# Patient Record
Sex: Female | Born: 1950
Health system: Southern US, Community
[De-identification: ages and names within clinical notes are randomized; demographics above are authoritative.]

## PROBLEM LIST (undated history)

## (undated) DIAGNOSIS — I10 Essential (primary) hypertension: Secondary | ICD-10-CM

## (undated) DIAGNOSIS — F329 Major depressive disorder, single episode, unspecified: Secondary | ICD-10-CM

## (undated) DIAGNOSIS — Z8719 Personal history of other diseases of the digestive system: Secondary | ICD-10-CM

## (undated) DIAGNOSIS — G894 Chronic pain syndrome: Secondary | ICD-10-CM

## (undated) DIAGNOSIS — G4730 Sleep apnea, unspecified: Secondary | ICD-10-CM

## (undated) DIAGNOSIS — E119 Type 2 diabetes mellitus without complications: Secondary | ICD-10-CM

## (undated) DIAGNOSIS — F32A Depression, unspecified: Secondary | ICD-10-CM

## (undated) DIAGNOSIS — IMO0001 Reserved for inherently not codable concepts without codable children: Secondary | ICD-10-CM

## (undated) DIAGNOSIS — I340 Nonrheumatic mitral (valve) insufficiency: Secondary | ICD-10-CM

## (undated) DIAGNOSIS — E669 Obesity, unspecified: Secondary | ICD-10-CM

## (undated) DIAGNOSIS — D509 Iron deficiency anemia, unspecified: Secondary | ICD-10-CM

## (undated) DIAGNOSIS — E1121 Type 2 diabetes mellitus with diabetic nephropathy: Secondary | ICD-10-CM

## (undated) DIAGNOSIS — K219 Gastro-esophageal reflux disease without esophagitis: Secondary | ICD-10-CM

## (undated) HISTORY — PX: BACK SURGERY: SHX140

## (undated) HISTORY — PX: CARDIAC CATHETERIZATION: SHX172

## (undated) HISTORY — PX: NOSE SURGERY: SHX723

## (undated) HISTORY — PX: ABDOMINAL HYSTERECTOMY: SHX81

## (undated) HISTORY — PX: HERNIA REPAIR: SHX51

---

## 1998-03-31 HISTORY — PX: OTHER SURGICAL HISTORY: SHX169

## 2001-08-29 HISTORY — PX: LAPAROSCOPIC OVARIAN CYSTECTOMY: SUR786

## 2003-03-01 HISTORY — PX: BREAST BIOPSY: SHX20

## 2003-08-20 ENCOUNTER — Other Ambulatory Visit: Payer: Self-pay

## 2004-03-26 ENCOUNTER — Ambulatory Visit: Payer: Self-pay | Admitting: Internal Medicine

## 2004-04-15 ENCOUNTER — Ambulatory Visit: Payer: Self-pay | Admitting: Rheumatology

## 2004-11-27 ENCOUNTER — Ambulatory Visit: Payer: Self-pay

## 2005-01-19 ENCOUNTER — Ambulatory Visit: Payer: Self-pay | Admitting: Gastroenterology

## 2005-07-01 ENCOUNTER — Ambulatory Visit: Payer: Self-pay | Admitting: Internal Medicine

## 2006-03-08 ENCOUNTER — Ambulatory Visit: Payer: Self-pay | Admitting: Otolaryngology

## 2006-07-18 ENCOUNTER — Ambulatory Visit: Payer: Self-pay | Admitting: Internal Medicine

## 2006-10-05 ENCOUNTER — Ambulatory Visit: Payer: Self-pay | Admitting: Pain Medicine

## 2006-10-08 ENCOUNTER — Ambulatory Visit: Payer: Self-pay | Admitting: Pain Medicine

## 2006-11-29 ENCOUNTER — Ambulatory Visit: Payer: Self-pay | Admitting: Pain Medicine

## 2007-01-11 ENCOUNTER — Ambulatory Visit: Payer: Self-pay | Admitting: Unknown Physician Specialty

## 2007-01-16 ENCOUNTER — Inpatient Hospital Stay: Payer: Self-pay | Admitting: Unknown Physician Specialty

## 2007-02-15 ENCOUNTER — Encounter: Payer: Self-pay | Admitting: Unknown Physician Specialty

## 2007-03-01 ENCOUNTER — Encounter: Payer: Self-pay | Admitting: Unknown Physician Specialty

## 2007-04-01 ENCOUNTER — Encounter: Payer: Self-pay | Admitting: Unknown Physician Specialty

## 2007-08-08 ENCOUNTER — Ambulatory Visit: Payer: Self-pay | Admitting: Internal Medicine

## 2008-01-11 ENCOUNTER — Ambulatory Visit: Payer: Self-pay | Admitting: Gastroenterology

## 2008-01-23 ENCOUNTER — Ambulatory Visit: Payer: Self-pay | Admitting: Unknown Physician Specialty

## 2008-02-13 ENCOUNTER — Ambulatory Visit: Payer: Self-pay | Admitting: Unknown Physician Specialty

## 2008-02-21 ENCOUNTER — Ambulatory Visit: Payer: Self-pay | Admitting: Unknown Physician Specialty

## 2008-02-21 ENCOUNTER — Other Ambulatory Visit: Payer: Self-pay

## 2008-02-21 ENCOUNTER — Ambulatory Visit: Payer: Self-pay | Admitting: Cardiovascular Disease

## 2008-02-28 ENCOUNTER — Inpatient Hospital Stay: Payer: Self-pay | Admitting: Unknown Physician Specialty

## 2008-12-06 ENCOUNTER — Emergency Department: Payer: Self-pay | Admitting: Emergency Medicine

## 2009-02-20 ENCOUNTER — Ambulatory Visit: Payer: Self-pay | Admitting: Unknown Physician Specialty

## 2009-06-25 ENCOUNTER — Ambulatory Visit: Payer: Self-pay | Admitting: Otolaryngology

## 2009-08-25 ENCOUNTER — Ambulatory Visit: Payer: Self-pay | Admitting: Internal Medicine

## 2010-01-07 ENCOUNTER — Ambulatory Visit: Payer: Self-pay | Admitting: Gastroenterology

## 2010-01-14 ENCOUNTER — Ambulatory Visit: Payer: Self-pay | Admitting: Gastroenterology

## 2010-03-18 ENCOUNTER — Ambulatory Visit: Payer: Self-pay | Admitting: Gastroenterology

## 2010-09-10 ENCOUNTER — Ambulatory Visit: Payer: Self-pay | Admitting: Otolaryngology

## 2010-09-14 LAB — PATHOLOGY REPORT

## 2011-09-01 ENCOUNTER — Ambulatory Visit: Payer: Self-pay | Admitting: Internal Medicine

## 2011-10-20 ENCOUNTER — Ambulatory Visit: Payer: Self-pay | Admitting: Orthopedic Surgery

## 2011-10-20 DIAGNOSIS — I10 Essential (primary) hypertension: Secondary | ICD-10-CM

## 2011-10-23 HISTORY — PX: OTHER SURGICAL HISTORY: SHX169

## 2011-10-26 ENCOUNTER — Ambulatory Visit: Payer: Self-pay | Admitting: Orthopedic Surgery

## 2012-04-28 ENCOUNTER — Ambulatory Visit: Payer: Self-pay | Admitting: Physical Medicine and Rehabilitation

## 2012-05-16 ENCOUNTER — Ambulatory Visit: Payer: Self-pay | Admitting: Physical Medicine and Rehabilitation

## 2013-08-28 ENCOUNTER — Ambulatory Visit: Payer: Self-pay | Admitting: Internal Medicine

## 2013-09-08 DIAGNOSIS — I340 Nonrheumatic mitral (valve) insufficiency: Secondary | ICD-10-CM | POA: Insufficient documentation

## 2014-01-01 ENCOUNTER — Ambulatory Visit: Payer: Self-pay | Admitting: Orthopaedic Surgery

## 2014-01-24 ENCOUNTER — Emergency Department: Payer: Self-pay | Admitting: Emergency Medicine

## 2014-01-24 LAB — CBC WITH DIFFERENTIAL/PLATELET
BASOS PCT: 0.4 %
Basophil #: 0 10*3/uL (ref 0.0–0.1)
Eosinophil #: 0.1 10*3/uL (ref 0.0–0.7)
Eosinophil %: 1 %
HCT: 33.6 % — ABNORMAL LOW (ref 35.0–47.0)
HGB: 10.6 g/dL — ABNORMAL LOW (ref 12.0–16.0)
Lymphocyte #: 1.9 10*3/uL (ref 1.0–3.6)
Lymphocyte %: 25.8 %
MCH: 27.2 pg (ref 26.0–34.0)
MCHC: 31.6 g/dL — ABNORMAL LOW (ref 32.0–36.0)
MCV: 86 fL (ref 80–100)
Monocyte #: 0.4 x10 3/mm (ref 0.2–0.9)
Monocyte %: 5.4 %
Neutrophil #: 4.9 10*3/uL (ref 1.4–6.5)
Neutrophil %: 67.4 %
PLATELETS: 244 10*3/uL (ref 150–440)
RBC: 3.9 10*6/uL (ref 3.80–5.20)
RDW: 15.6 % — ABNORMAL HIGH (ref 11.5–14.5)
WBC: 7.3 10*3/uL (ref 3.6–11.0)

## 2014-01-24 LAB — URINALYSIS, COMPLETE
BLOOD: NEGATIVE
Bilirubin,UR: NEGATIVE
Glucose,UR: NEGATIVE mg/dL (ref 0–75)
KETONE: NEGATIVE
Leukocyte Esterase: NEGATIVE
Nitrite: NEGATIVE
Ph: 6 (ref 4.5–8.0)
RBC,UR: 1 /HPF (ref 0–5)
SPECIFIC GRAVITY: 1.014 (ref 1.003–1.030)
Squamous Epithelial: <1
WBC UR: 1 /HPF (ref 0–5)

## 2014-01-24 LAB — COMPREHENSIVE METABOLIC PANEL
ALBUMIN: 3.4 g/dL (ref 3.4–5.0)
ALK PHOS: 101 U/L
AST: 14 U/L — AB (ref 15–37)
Anion Gap: 6 — ABNORMAL LOW (ref 7–16)
BILIRUBIN TOTAL: 0.3 mg/dL (ref 0.2–1.0)
BUN: 8 mg/dL (ref 7–18)
CALCIUM: 8.5 mg/dL (ref 8.5–10.1)
CO2: 32 mmol/L (ref 21–32)
CREATININE: 0.86 mg/dL (ref 0.60–1.30)
Chloride: 105 mmol/L (ref 98–107)
EGFR (African American): >60
Glucose: 127 mg/dL — ABNORMAL HIGH (ref 65–99)
OSMOLALITY: 285 (ref 275–301)
Potassium: 3.4 mmol/L — ABNORMAL LOW (ref 3.5–5.1)
SGPT (ALT): 19 U/L
Sodium: 143 mmol/L (ref 136–145)
Total Protein: 7.5 g/dL (ref 6.4–8.2)

## 2014-02-22 DIAGNOSIS — M4712 Other spondylosis with myelopathy, cervical region: Secondary | ICD-10-CM | POA: Insufficient documentation

## 2014-02-22 DIAGNOSIS — I10 Essential (primary) hypertension: Secondary | ICD-10-CM | POA: Insufficient documentation

## 2014-02-22 DIAGNOSIS — D631 Anemia in chronic kidney disease: Secondary | ICD-10-CM | POA: Insufficient documentation

## 2014-09-22 NOTE — Op Note (Signed)
PATIENT NAME:  Cindy [name], Cindy [name] MR#:  [id number] DATE OF BIRTH:  05-26-1951  DATE OF PROCEDURE:  10/26/2011  PREOPERATIVE DIAGNOSES:  1. Right carpal tunnel syndrome. 2. Right de Quervain's stenosing tenosynovitis.   POSTOPERATIVE DIAGNOSES: 1. Right carpal tunnel syndrome. 2. Right de Quervain's stenosing tenosynovitis.   PROCEDURES:  1. Right carpal tunnel release. 2. Right de Quervain release.   ANESTHESIA: General.   SURGEON: Laurene Footman, MD    DESCRIPTION OF PROCEDURE: The patient was brought to the operating room and after adequate anesthesia was obtained the right arm was prepped and draped in the usual sterile fashion with a tourniquet applied to the upper arm. After prepping and draping in the usual sterile manner, appropriate patient identification and time-out procedures were carried out. The arm was exsanguinated with an Esmarch and the tourniquet raised to 250 mmHg. The carpal tunnel was addressed first with approximately 2.5 cm incision in line with the ring metacarpal along the palmar crease. After making this incision down through the skin and subcutaneous tissue, the transverse carpal ligament was identified and incised. A small hemostat was placed deep to this to protect it and scissors used to release the nerve first distally and then proximally. In the midportion of the carpal tunnel there was some hourglass constriction. After release of this, there was good reverse blood flow and pinking up of the nerve consistent with adequate release. After this was completed, the wound was irrigated and closed with simple interrupted 5-0 nylon.   Next, the de Quervain's was approached through a transverse incision at the level of the wrist crease with the wrist in radial deviation. The skin and subcutaneous tissue were carefully dissected preserving superficial cutaneous nerves. The area of swelling was identified and incised. With the retinaculum then incised, there was a large gush  of fluid consistent with de Quervain's. This was released distally and proximally and a hemostat used to spread the tissue to make sure all bands were separated and there appeared to be adequate release of the transverse carpal ligament.   At this point, this wound was thoroughly irrigated and again closed with simple interrupted 5-0 nylon. A total of 10 mL of 0.5% Sensorcaine without epinephrine was infiltrated into the skin and subcutaneous tissue. Hemostasis was checked with electrocautery prior to wound closure. The wound was then dressed with Xeroform, 4 x 4's, Webril, and Ace wrap. Tourniquet was let down. The patient was sent to the recovery room in stable condition. Total tourniquet time was 19 minutes.   ESTIMATED BLOOD LOSS: Minimal.   COMPLICATIONS: None.   SPECIMENS: None.   ____________________________ Laurene Footman, MD mjm:drc D: 10/26/2011 19:30:27 ET T: 10/27/2011 10:45:16 ET JOB#: [id number]  cc: Laurene Footman, MD, <Dictator> Laurene Footman MD ELECTRONICALLY SIGNED 10/27/2011 17:07

## 2014-10-09 NOTE — H&P (Signed)
Progress Notes   Cindy [name] (MR# [id number])        Progress Notes Info     Author Note Status Last Update User Last Update Date/Time   Isaias Cowman, MD Signed Isaias Cowman, MD Thu Oct 03, 2014 11:08 AM    Progress Notes    Expand All Collapse All   Established Patient Visit   Chief Complaint: Chief Complaint  Patient presents with  . Follow-up    echo/myoview  . Shortness of Breath    with excertion, and sometime she cant finish a sentence  . Dizziness   Date of Service: 10/03/2014 Date of Birth: [birth year] PCP: DAVID Raeanne Barry, MD  History of Present Illness: Cindy [name] is a 64 y.o.female patient who returns for hypertension, hyperlipidemia, type 2 diabetes and new onset chest pain with exertional dyspnea. Previous cardiac catheterization 2004 revealed insignificant coronary artery disease. Patient was seen on 09/10/2014 with new onset chest discomfort, described as heaviness in the retrosternal area, which occurs with or after walking, the last up to an hour. There is no associated radiation nausea or vomiting. Since her initial visit the patient reports she still experiencing recurrent episodes of chest discomfort. Also has exertional dyspnea which has been progressive with such activities as walking uphill. She also complains of episodes of shortness of breath arrest. She also notes occasional episodes of palpitations. She does have persistent peripheral edema. Echocardiogram was performed 09/25/2014 which revealed LVEF greater than 55% with moderate mitral and tricuspid regurgitation. She underwent Lexus scan sestamibi study on 09/25/2014 with gated scintigraphy revealed LV ejection fraction 64%, with mildly enlarged left ventricle. SPECT imaging revealed moderate anterior wall scar and moderate lateral wall ischemia.  The patient has essential hypertension, systolic blood pressure mildly elevated, currently on lisinopril and  amlodipine, tolerated well without apparent side effects.  The patient has hyperlipidemia, LDL cholesterol 85 on 02/22/2014, currently on atorvastatin, which is tolerated well without apparent side effects.  The patient has type 2 diabetes, fasting glucose 86, hemoglobin A1c 7.2 on 02/22/2014, currently taking glimepiride and metformin, both tolerated well without apparent side effects. Patient currently not experiencing any symptoms specific to diabetes.  Past Medical and Surgical History  Past Medical History Past Medical History  Diagnosis Date  . Microalbuminuric diabetic nephropathy   . Hypercholesterolemia   . Essential hypertension, benign   . Allergic rhinitis   . Depression   . GERD (gastroesophageal reflux disease)     gastritis on 8/99 EGD  . Sleep apnea     CPAP 13 cm  . Mitral regurgitation   . Neuropathy associated with endocrine disorder   . Hiatal hernia   . Hx of adenomatous colonic polyps   . Chronic pain syndrome     Neck & low back  . Abnormal barium swallow     osteophytes w/ cervical deformity s/p cervical fusion  . Obesity   . Cervical spondylosis with myelopathy     Dr Patrice Paradise, [facility] in [place]  . Iron deficiency anemia   . Proteinuria   . Carpal tunnel syndrome   . DM (diabetes mellitus), type 2 with renal complications [id number]    Started w/ GDM     Past Surgical History She has past surgical history that includes Hysterectomy (1991); laparoscopic esophagogastric fundoplasty nissen procedure (03/1998); Anterior fusion cervical spine; Hernia repair; excision benign skin lesion leg; Excision Benign Skin Lesion Chest; Endoscopic Carpal Tunnel Release (Right, 10/23/11); Cardiac catheterization; Posterior fusion lumbar spine (12/2006); Removal Ovarian  Cyst (08/2001); and Percutaneous Biopsy Breast (03/2003).   Medications and Allergies  Current  Medications   Current Medications    Current Outpatient Prescriptions  Medication Sig Dispense Refill  . acetaminophen (TYLENOL) 650 MG ER tablet Take 650 mg by mouth once daily.    Marland Kitchen amLODIPine (NORVASC) 10 MG tablet Take 1 tablet (10 mg total) by mouth once daily. For blood pressure 30 tablet prn  . amoxicillin (AMOXIL) 500 MG capsule   0  . aspirin 81 MG EC tablet Take 81 mg by mouth once daily.    Marland Kitchen atorvastatin (LIPITOR) 40 MG tablet Take 1 tablet (40 mg total) by mouth once daily. For cholesterol 30 tablet prn  . cetirizine (ZYRTEC) 10 MG tablet Take 10 mg by mouth once daily as needed for Allergies.    . fluticasone (FLONASE) 50 mcg/actuation nasal spray Place 2 sprays into both nostrils once daily as needed for Allergies. 16 g prn  . FUROsemide (LASIX) 80 MG tablet Take 1 tablet (80 mg total) by mouth 2 (two) times daily. For fluid 60 tablet prn  . glimepiride (AMARYL) 2 MG tablet Take 1 tablet (2 mg total) by mouth as directed. 2 tablets in the morning, one in the evening 90 tablet prn  . lisinopril (PRINIVIL,ZESTRIL) 40 MG tablet Take 1 tablet (40 mg total) by mouth once daily. For blood pressure 30 tablet prn  . metFORMIN (GLUCOPHAGE) 1000 MG tablet Take 1 tablet (1,000 mg total) by mouth 2 (two) times daily with meals. For diabetes 60 tablet prn  . metoprolol succinate (TOPROL-XL) 100 MG XL tablet Take 1.5 tablets (150 mg total) by mouth once daily. For blood pressure 45 tablet 5  . minoxidil 10 MG tablet Take 1 tablet (10 mg total) by mouth once daily. For blood pressure 30 tablet prn  . multivitamin tablet Take 1 tablet by mouth once daily.    Marland Kitchen omeprazole (PRILOSEC OTC) 20 MG tablet Take 20 mg by mouth 2 (two) times daily as needed.     Marland Kitchen PARoxetine (PAXIL) 40 MG tablet Take 1 tablet (40 mg total) by mouth once daily. For nerves 30 tablet prn  . polyethylene glycol (MIRALAX) powder Take 17 g  by mouth once daily. Or as needed for constipation 527 g prn  . potassium chloride (K-DUR,KLOR-CON) 20 MEQ ER tablet Take 2 tablets (40 mEq total) by mouth 2 (two) times daily. For potassium 120 tablet prn  . tiZANidine (ZANAFLEX) 4 MG tablet Take 4 mg by mouth every 12 (twelve) hours as needed for Muscle spasms.   0  . traMADol (ULTRAM) 50 mg tablet Take 50 mg by mouth every 8 (eight) hours as needed for Pain.     No current facility-administered medications for this visit.      Allergies: Vicodin  Social and Family History  Social History  reports that she has never smoked. Her smokeless tobacco use includes Snuff. She reports that she does not drink alcohol or use illicit drugs.  Family History Family History  Problem Relation Age of Onset  . Heart attack Mother 15  . Hypertension Father   . Stroke Father   . Heart attack Brother 41    Review of Systems   Review of Systems: The patient reports chest pain, with exertional shortness of breath, without orthopnea, paroxysmal nocturnal dyspnea, with 1st pedal edema, occasional palpitations, heart racing, without presyncope, syncope. Review of 12 Systems is negative except as described above.  Physical Examination   Vitals:BP 146/80 mmHg  Pulse 62  Ht 154.9 cm (5\' 1" )  Wt 114.669 kg (252 lb 12.8 oz)  BMI 47.79 kg/m2 Ht:154.9 cm (5\' 1" ) Wt:114.669 kg (252 lb 12.8 oz) [id number] surface area is 2.22 meters squared. Body mass index is 47.79 kg/(m^2).  HEENT: Pupils equally reactive to light and accomodation Neck: Supple without thyromegaly, carotid pulses 2+ Lungs: clear to auscultation bilaterally; no wheezes, rales, rhonchi Heart: Regular rate and rhythm. No gallops, murmurs or rub Abdomen: soft nontender, nondistended, with normal bowel sounds Extremities: no cyanosis, clubbing, or edema Peripheral Pulses: 2+ in all extremities, 2+ femoral pulses bilaterally  Assessment    64 y.o. female with  1. Non-rheumatic mitral regurgitation   2. Essential hypertension, benign   3. CAD S/P percutaneous coronary angioplasty   4. Type 2 diabetes mellitus with diabetic nephropathy   5. Pedal edema   6. Hypercholesterolemia   7. Chest pain with high risk for cardiac etiology    64 year old female with multiple cardiovascular risk factors, including hypertension, hyperlipidemia and type 2 diabetes, new onset substernal chest discomfort and progressive exertional dyspnea. Lexus scan sestamibi study revealed not normal left ventricular function with moderate anterior scar and moderate lateral wall ischemia. The patient has [place] class 3 chest pain with moderate risk stress test result.  The patient has essential hypertension, systolic blood pressure mildly elevated, currently on lisinopril and and amlodipine, tolerated well without apparent side effects. The patient has hyperlipidemia, good control, currently taking atorvastatin, tolerated well without apparent side effects. The patient has type 2 diabetes, with adequate control, currently on glimepiride and metformin.    Plan   1. Continue current medications 2. Counseled patient about low-sodium diet 3. DASH diet printed instructions given to the patient 4. Counseled patient about low-cholesterol diet 5. Continue atorvastatin for hyperlipidemia management  6. Low-fat and cholesterol diet printed instructions given to the patient  7. Diabetes diet printed instructions given to the patient next 8. Proceed with cardiac catheterization with selective coronary arteriography. The risks, benefits alternatives of cardiac catheterization were explained to the patient and informed written consent was obtained. Hold metformin morning of cardiac catheterization.  No orders of the defined types were placed in this encounter.   Return after cardiac cath.  Isaias Cowman, [facility]   [address] [place] [id number]    Service Location    Name Address       [address] [place] [address]      Department    Name Address Phone Fax   [facility] [place] [address] [phone]

## 2014-10-10 ENCOUNTER — Encounter
Admission: RE | Disposition: A | Discharge status: Home / Self Care | Payer: Medicare Other | Source: Ambulatory Visit | Attending: Cardiology

## 2014-10-10 ENCOUNTER — Ambulatory Visit
Admission: RE | Admit: 2014-10-10 | Discharge: 2014-10-10 | Disposition: A | Discharge status: Home / Self Care | Payer: Medicare Other | Source: Ambulatory Visit | Attending: Cardiology | Admitting: Cardiology

## 2014-10-10 ENCOUNTER — Encounter: Payer: Self-pay | Admitting: *Deleted

## 2014-10-10 DIAGNOSIS — D509 Iron deficiency anemia, unspecified: Secondary | ICD-10-CM | POA: Insufficient documentation

## 2014-10-10 DIAGNOSIS — G4730 Sleep apnea, unspecified: Secondary | ICD-10-CM | POA: Insufficient documentation

## 2014-10-10 DIAGNOSIS — G894 Chronic pain syndrome: Secondary | ICD-10-CM | POA: Diagnosis not present

## 2014-10-10 DIAGNOSIS — E785 Hyperlipidemia, unspecified: Secondary | ICD-10-CM | POA: Diagnosis not present

## 2014-10-10 DIAGNOSIS — R809 Proteinuria, unspecified: Secondary | ICD-10-CM | POA: Diagnosis not present

## 2014-10-10 DIAGNOSIS — F329 Major depressive disorder, single episode, unspecified: Secondary | ICD-10-CM | POA: Insufficient documentation

## 2014-10-10 DIAGNOSIS — G5600 Carpal tunnel syndrome, unspecified upper limb: Secondary | ICD-10-CM | POA: Diagnosis not present

## 2014-10-10 DIAGNOSIS — I2510 Atherosclerotic heart disease of native coronary artery without angina pectoris: Secondary | ICD-10-CM | POA: Insufficient documentation

## 2014-10-10 DIAGNOSIS — R600 Localized edema: Secondary | ICD-10-CM | POA: Insufficient documentation

## 2014-10-10 DIAGNOSIS — I10 Essential (primary) hypertension: Secondary | ICD-10-CM | POA: Diagnosis not present

## 2014-10-10 DIAGNOSIS — M7989 Other specified soft tissue disorders: Secondary | ICD-10-CM | POA: Diagnosis not present

## 2014-10-10 DIAGNOSIS — M419 Scoliosis, unspecified: Secondary | ICD-10-CM | POA: Diagnosis not present

## 2014-10-10 DIAGNOSIS — J309 Allergic rhinitis, unspecified: Secondary | ICD-10-CM | POA: Insufficient documentation

## 2014-10-10 DIAGNOSIS — Z981 Arthrodesis status: Secondary | ICD-10-CM | POA: Diagnosis not present

## 2014-10-10 DIAGNOSIS — M542 Cervicalgia: Secondary | ICD-10-CM | POA: Insufficient documentation

## 2014-10-10 DIAGNOSIS — E780 Pure hypercholesterolemia: Secondary | ICD-10-CM | POA: Diagnosis not present

## 2014-10-10 DIAGNOSIS — E1140 Type 2 diabetes mellitus with diabetic neuropathy, unspecified: Secondary | ICD-10-CM | POA: Insufficient documentation

## 2014-10-10 DIAGNOSIS — K449 Diaphragmatic hernia without obstruction or gangrene: Secondary | ICD-10-CM | POA: Insufficient documentation

## 2014-10-10 DIAGNOSIS — R0600 Dyspnea, unspecified: Secondary | ICD-10-CM | POA: Diagnosis present

## 2014-10-10 DIAGNOSIS — K2970 Gastritis, unspecified, without bleeding: Secondary | ICD-10-CM | POA: Insufficient documentation

## 2014-10-10 DIAGNOSIS — I340 Nonrheumatic mitral (valve) insufficiency: Secondary | ICD-10-CM | POA: Diagnosis not present

## 2014-10-10 DIAGNOSIS — Z79899 Other long term (current) drug therapy: Secondary | ICD-10-CM | POA: Diagnosis not present

## 2014-10-10 DIAGNOSIS — R079 Chest pain, unspecified: Secondary | ICD-10-CM | POA: Insufficient documentation

## 2014-10-10 DIAGNOSIS — M545 Low back pain: Secondary | ICD-10-CM | POA: Diagnosis not present

## 2014-10-10 DIAGNOSIS — R0602 Shortness of breath: Secondary | ICD-10-CM | POA: Insufficient documentation

## 2014-10-10 DIAGNOSIS — Z86010 Personal history of colonic polyps: Secondary | ICD-10-CM | POA: Insufficient documentation

## 2014-10-10 DIAGNOSIS — R42 Dizziness and giddiness: Secondary | ICD-10-CM | POA: Diagnosis not present

## 2014-10-10 DIAGNOSIS — K219 Gastro-esophageal reflux disease without esophagitis: Secondary | ICD-10-CM | POA: Insufficient documentation

## 2014-10-10 DIAGNOSIS — E669 Obesity, unspecified: Secondary | ICD-10-CM | POA: Diagnosis not present

## 2014-10-10 HISTORY — DX: Depression, unspecified: F32.A

## 2014-10-10 HISTORY — DX: Gastro-esophageal reflux disease without esophagitis: K21.9

## 2014-10-10 HISTORY — DX: Personal history of other diseases of the digestive system: Z87.19

## 2014-10-10 HISTORY — DX: Essential (primary) hypertension: I10

## 2014-10-10 HISTORY — PX: CARDIAC CATHETERIZATION: SHX172

## 2014-10-10 HISTORY — DX: Major depressive disorder, single episode, unspecified: F32.9

## 2014-10-10 HISTORY — DX: Sleep apnea, unspecified: G47.30

## 2014-10-10 HISTORY — DX: Reserved for inherently not codable concepts without codable children: IMO0001

## 2014-10-10 HISTORY — DX: Type 2 diabetes mellitus without complications: E11.9

## 2014-10-10 LAB — GLUCOSE, CAPILLARY: GLUCOSE-CAPILLARY: 117 mg/dL — AB (ref 65–99)

## 2014-10-10 SURGERY — LEFT HEART CATH
Anesthesia: Moderate Sedation

## 2014-10-10 MED ORDER — SODIUM CHLORIDE 0.9 % IV SOLN
INTRAVENOUS | Status: DC
  Administered 2014-10-10: 08:00:00 via INTRAVENOUS

## 2014-10-10 MED ORDER — SODIUM CHLORIDE 0.9 % WEIGHT BASED INFUSION: 3.0000 mL/kg/h | INTRAVENOUS | Status: DC

## 2014-10-10 MED ORDER — SODIUM CHLORIDE 0.9 % IV SOLN: 250.0000 mL | INTRAVENOUS | Status: DC | PRN

## 2014-10-10 MED ORDER — ONDANSETRON HCL 4 MG/2ML IJ SOLN
4.0000 mg | Freq: Four times a day (QID) | INTRAMUSCULAR | Status: DC | PRN
Start: 2014-10-10 — End: 2014-10-10

## 2014-10-10 MED ORDER — MIDAZOLAM HCL 2 MG/2ML IJ SOLN
INTRAMUSCULAR | Status: DC | PRN
Start: 2014-10-10 — End: 2014-10-10
  Administered 2014-10-10 (×2): 1 mg via INTRAVENOUS

## 2014-10-10 MED ORDER — FENTANYL CITRATE (PF) 100 MCG/2ML IJ SOLN
INTRAMUSCULAR | Status: DC | PRN
  Administered 2014-10-10: 25 ug via INTRAVENOUS

## 2014-10-10 MED ORDER — SODIUM CHLORIDE 0.9 % IJ SOLN
3.0000 mL | Freq: Two times a day (BID) | INTRAMUSCULAR | Status: DC
Start: 2014-10-10 — End: 2014-10-10

## 2014-10-10 MED ORDER — SODIUM CHLORIDE 0.9 % IJ SOLN: 3.0000 mL | INTRAMUSCULAR | Status: DC | PRN

## 2014-10-10 MED ORDER — METOPROLOL TARTRATE 1 MG/ML IV SOLN
INTRAVENOUS | Status: AC
  Filled 2014-10-10: qty 5

## 2014-10-10 MED ORDER — FENTANYL CITRATE (PF) 100 MCG/2ML IJ SOLN
INTRAMUSCULAR | Status: AC
  Filled 2014-10-10: qty 2

## 2014-10-10 MED ORDER — METOPROLOL TARTRATE 1 MG/ML IV SOLN
INTRAVENOUS | Status: DC | PRN
  Administered 2014-10-10 (×2): 2.5 mg via INTRAVENOUS

## 2014-10-10 MED ORDER — ACETAMINOPHEN 325 MG PO TABS: 650.0000 mg | ORAL_TABLET | ORAL | Status: DC | PRN

## 2014-10-10 MED ORDER — MIDAZOLAM HCL 2 MG/2ML IJ SOLN
INTRAMUSCULAR | Status: AC
  Filled 2014-10-10: qty 2

## 2014-10-10 SURGICAL SUPPLY — 9 items
CATH INFINITI 5FR ANG PIGTAIL (CATHETERS) ×3 IMPLANT
CATH INFINITI 5FR JL4 (CATHETERS) ×3 IMPLANT
CATH INFINITI JR4 5F (CATHETERS) ×3 IMPLANT
DEVICE CLOSURE MYNXGRIP 5F (Vascular Products) ×3 IMPLANT
KIT MANI 3VAL PERCEP (MISCELLANEOUS) ×3 IMPLANT
NEEDLE PERC 18GX7CM (NEEDLE) ×3 IMPLANT
PACK CARDIAC CATH (CUSTOM PROCEDURE TRAY) ×3 IMPLANT
SHEATH AVANTI 5FR X 11CM (SHEATH) ×3 IMPLANT
WIRE EMERALD 3MM-J .035X150CM (WIRE) ×3 IMPLANT

## 2014-10-31 ENCOUNTER — Other Ambulatory Visit: Payer: Self-pay | Admitting: Physical Medicine and Rehabilitation

## 2014-10-31 ENCOUNTER — Ambulatory Visit
Admission: RE | Admit: 2014-10-31 | Discharge: 2014-10-31 | Disposition: A | Discharge status: Home / Self Care | Payer: Medicare Other | Source: Ambulatory Visit | Attending: Physical Medicine and Rehabilitation | Admitting: Physical Medicine and Rehabilitation

## 2014-10-31 DIAGNOSIS — M79661 Pain in right lower leg: Secondary | ICD-10-CM

## 2014-10-31 DIAGNOSIS — M79604 Pain in right leg: Secondary | ICD-10-CM | POA: Diagnosis not present

## 2014-10-31 DIAGNOSIS — M51369 Other intervertebral disc degeneration, lumbar region without mention of lumbar back pain or lower extremity pain: Secondary | ICD-10-CM | POA: Insufficient documentation

## 2014-10-31 DIAGNOSIS — M5136 Other intervertebral disc degeneration, lumbar region: Secondary | ICD-10-CM | POA: Insufficient documentation

## 2014-11-01 ENCOUNTER — Other Ambulatory Visit: Payer: Self-pay | Admitting: Physical Medicine and Rehabilitation

## 2014-11-01 DIAGNOSIS — M5416 Radiculopathy, lumbar region: Secondary | ICD-10-CM

## 2014-11-11 ENCOUNTER — Ambulatory Visit
Admission: RE | Admit: 2014-11-11 | Discharge: 2014-11-11 | Disposition: A | Discharge status: Home / Self Care | Payer: Medicare Other | Source: Ambulatory Visit | Attending: Physical Medicine and Rehabilitation | Admitting: Physical Medicine and Rehabilitation

## 2014-11-11 DIAGNOSIS — M5416 Radiculopathy, lumbar region: Secondary | ICD-10-CM

## 2014-11-11 DIAGNOSIS — M4806 Spinal stenosis, lumbar region: Secondary | ICD-10-CM | POA: Insufficient documentation

## 2014-11-11 DIAGNOSIS — M545 Low back pain: Secondary | ICD-10-CM | POA: Diagnosis present

## 2014-11-11 DIAGNOSIS — Z9889 Other specified postprocedural states: Secondary | ICD-10-CM | POA: Diagnosis not present

## 2014-11-11 DIAGNOSIS — M5126 Other intervertebral disc displacement, lumbar region: Secondary | ICD-10-CM | POA: Insufficient documentation

## 2014-11-11 DIAGNOSIS — M5136 Other intervertebral disc degeneration, lumbar region: Secondary | ICD-10-CM | POA: Diagnosis not present

## 2014-11-11 DIAGNOSIS — M47896 Other spondylosis, lumbar region: Secondary | ICD-10-CM | POA: Insufficient documentation

## 2014-11-11 DIAGNOSIS — Z981 Arthrodesis status: Secondary | ICD-10-CM | POA: Insufficient documentation

## 2014-11-11 DIAGNOSIS — M79604 Pain in right leg: Secondary | ICD-10-CM | POA: Diagnosis present

## 2015-02-07 ENCOUNTER — Other Ambulatory Visit: Payer: Self-pay | Admitting: Nurse Practitioner

## 2015-02-07 DIAGNOSIS — R110 Nausea: Secondary | ICD-10-CM

## 2015-02-07 DIAGNOSIS — R1011 Right upper quadrant pain: Secondary | ICD-10-CM

## 2015-02-12 ENCOUNTER — Ambulatory Visit
Admission: RE | Admit: 2015-02-12 | Discharge: 2015-02-12 | Disposition: A | Discharge status: Home / Self Care | Payer: PPO | Source: Ambulatory Visit | Attending: Nurse Practitioner | Admitting: Nurse Practitioner

## 2015-02-12 DIAGNOSIS — R110 Nausea: Secondary | ICD-10-CM

## 2015-02-12 DIAGNOSIS — R1011 Right upper quadrant pain: Secondary | ICD-10-CM | POA: Insufficient documentation

## 2015-02-19 ENCOUNTER — Encounter: Payer: Self-pay | Admitting: *Deleted

## 2015-02-20 ENCOUNTER — Ambulatory Visit: Payer: PPO | Admitting: Anesthesiology

## 2015-02-20 ENCOUNTER — Encounter: Payer: Self-pay | Admitting: Anesthesiology

## 2015-02-20 ENCOUNTER — Encounter
Admission: RE | Disposition: A | Discharge status: Home / Self Care | Payer: Self-pay | Source: Ambulatory Visit | Attending: Gastroenterology

## 2015-02-20 ENCOUNTER — Ambulatory Visit
Admission: RE | Admit: 2015-02-20 | Discharge: 2015-02-20 | Disposition: A | Discharge status: Home / Self Care | Payer: PPO | Source: Ambulatory Visit | Attending: Gastroenterology | Admitting: Gastroenterology

## 2015-02-20 DIAGNOSIS — R194 Change in bowel habit: Secondary | ICD-10-CM | POA: Insufficient documentation

## 2015-02-20 DIAGNOSIS — R808 Other proteinuria: Secondary | ICD-10-CM | POA: Diagnosis not present

## 2015-02-20 DIAGNOSIS — Z823 Family history of stroke: Secondary | ICD-10-CM | POA: Insufficient documentation

## 2015-02-20 DIAGNOSIS — Z79899 Other long term (current) drug therapy: Secondary | ICD-10-CM | POA: Insufficient documentation

## 2015-02-20 DIAGNOSIS — M47892 Other spondylosis, cervical region: Secondary | ICD-10-CM | POA: Insufficient documentation

## 2015-02-20 DIAGNOSIS — Z86010 Personal history of colonic polyps: Secondary | ICD-10-CM | POA: Diagnosis present

## 2015-02-20 DIAGNOSIS — F329 Major depressive disorder, single episode, unspecified: Secondary | ICD-10-CM | POA: Diagnosis not present

## 2015-02-20 DIAGNOSIS — E1140 Type 2 diabetes mellitus with diabetic neuropathy, unspecified: Secondary | ICD-10-CM | POA: Insufficient documentation

## 2015-02-20 DIAGNOSIS — E669 Obesity, unspecified: Secondary | ICD-10-CM | POA: Diagnosis not present

## 2015-02-20 DIAGNOSIS — G4730 Sleep apnea, unspecified: Secondary | ICD-10-CM | POA: Diagnosis not present

## 2015-02-20 DIAGNOSIS — R1310 Dysphagia, unspecified: Secondary | ICD-10-CM | POA: Insufficient documentation

## 2015-02-20 DIAGNOSIS — K219 Gastro-esophageal reflux disease without esophagitis: Secondary | ICD-10-CM | POA: Diagnosis not present

## 2015-02-20 DIAGNOSIS — K921 Melena: Secondary | ICD-10-CM | POA: Diagnosis not present

## 2015-02-20 DIAGNOSIS — I340 Nonrheumatic mitral (valve) insufficiency: Secondary | ICD-10-CM | POA: Diagnosis not present

## 2015-02-20 DIAGNOSIS — Z6841 Body Mass Index (BMI) 40.0 and over, adult: Secondary | ICD-10-CM | POA: Insufficient documentation

## 2015-02-20 DIAGNOSIS — Z90710 Acquired absence of both cervix and uterus: Secondary | ICD-10-CM | POA: Insufficient documentation

## 2015-02-20 DIAGNOSIS — Z885 Allergy status to narcotic agent status: Secondary | ICD-10-CM | POA: Diagnosis not present

## 2015-02-20 DIAGNOSIS — K5900 Constipation, unspecified: Secondary | ICD-10-CM | POA: Diagnosis not present

## 2015-02-20 DIAGNOSIS — E1121 Type 2 diabetes mellitus with diabetic nephropathy: Secondary | ICD-10-CM | POA: Diagnosis not present

## 2015-02-20 DIAGNOSIS — R12 Heartburn: Secondary | ICD-10-CM | POA: Diagnosis not present

## 2015-02-20 DIAGNOSIS — I10 Essential (primary) hypertension: Secondary | ICD-10-CM | POA: Insufficient documentation

## 2015-02-20 DIAGNOSIS — J309 Allergic rhinitis, unspecified: Secondary | ICD-10-CM | POA: Diagnosis not present

## 2015-02-20 DIAGNOSIS — Z8249 Family history of ischemic heart disease and other diseases of the circulatory system: Secondary | ICD-10-CM | POA: Insufficient documentation

## 2015-02-20 HISTORY — DX: Iron deficiency anemia, unspecified: D50.9

## 2015-02-20 HISTORY — PX: COLONOSCOPY WITH PROPOFOL: SHX5780

## 2015-02-20 HISTORY — DX: Chronic pain syndrome: G89.4

## 2015-02-20 HISTORY — PX: ESOPHAGOGASTRODUODENOSCOPY (EGD) WITH PROPOFOL: SHX5813

## 2015-02-20 HISTORY — DX: Obesity, unspecified: E66.9

## 2015-02-20 HISTORY — DX: Nonrheumatic mitral (valve) insufficiency: I34.0

## 2015-02-20 HISTORY — DX: Type 2 diabetes mellitus with diabetic nephropathy: E11.21

## 2015-02-20 LAB — GLUCOSE, CAPILLARY: GLUCOSE-CAPILLARY: 197 mg/dL — AB (ref 65–99)

## 2015-02-20 SURGERY — COLONOSCOPY WITH PROPOFOL
Anesthesia: General

## 2015-02-20 MED ORDER — GLYCOPYRROLATE 0.2 MG/ML IJ SOLN
INTRAMUSCULAR | Status: DC | PRN
  Administered 2015-02-20: 0.2 mg via INTRAVENOUS

## 2015-02-20 MED ORDER — SODIUM CHLORIDE 0.9 % IV SOLN: INTRAVENOUS | Status: DC

## 2015-02-20 MED ORDER — MIDAZOLAM HCL 2 MG/2ML IJ SOLN
INTRAMUSCULAR | Status: DC | PRN
  Administered 2015-02-20: 2 mg via INTRAVENOUS

## 2015-02-20 MED ORDER — SODIUM CHLORIDE 0.9 % IV SOLN
INTRAVENOUS | Status: DC
  Administered 2015-02-20: 08:00:00 via INTRAVENOUS

## 2015-02-20 MED ORDER — FENTANYL CITRATE (PF) 100 MCG/2ML IJ SOLN
INTRAMUSCULAR | Status: DC | PRN
  Administered 2015-02-20: 50 ug via INTRAVENOUS

## 2015-02-20 MED ORDER — PROPOFOL INFUSION 10 MG/ML OPTIME
INTRAVENOUS | Status: DC | PRN
Start: 2015-02-20 — End: 2015-02-20
  Administered 2015-02-20: 140 ug/kg/min via INTRAVENOUS

## 2015-02-20 MED ORDER — LIDOCAINE HCL (CARDIAC) 20 MG/ML IV SOLN
INTRAVENOUS | Status: DC | PRN
  Administered 2015-02-20: 80 mg via INTRAVENOUS

## 2015-02-20 NOTE — Anesthesia Preprocedure Evaluation (Signed)
Anesthesia Evaluation  Patient identified by MRN, date of birth, ID band Patient awake    Reviewed: Allergy & Precautions, NPO status , Patient's Chart, lab work & pertinent test results, reviewed documented beta blocker date and time   Airway Mallampati: III  TM Distance: <3 FB Neck ROM: Limited    Dental  (+) Caps   Pulmonary shortness of breath and with exertion, sleep apnea ,    Pulmonary exam normal breath sounds clear to auscultation       Cardiovascular hypertension, Pt. on medications and Pt. on home beta blockers Normal cardiovascular exam     Neuro/Psych Depression    GI/Hepatic hiatal hernia, GERD  Medicated and Controlled,  Endo/Other  diabetes, Type 2, Oral Hypoglycemic Agents  Renal/GU Diabetic nephropathy  negative genitourinary   Musculoskeletal negative musculoskeletal ROS (+)   Abdominal Normal abdominal exam  (+)   Peds negative pediatric ROS (+)  Hematology  (+) anemia ,   Anesthesia Other Findings obesity  Reproductive/Obstetrics                             Anesthesia Physical Anesthesia Plan  ASA: III  Anesthesia Plan: General   Post-op Pain Management:    Induction: Intravenous  Airway Management Planned: Nasal Cannula  Additional Equipment:   Intra-op Plan:   Post-operative Plan:   Informed Consent: I have reviewed the patients History and Physical, chart, labs and discussed the procedure including the risks, benefits and alternatives for the proposed anesthesia with the patient or authorized representative who has indicated his/her understanding and acceptance.   Dental advisory given  Plan Discussed with: CRNA and Surgeon  Anesthesia Plan Comments:         Anesthesia Quick Evaluation

## 2015-02-20 NOTE — Transfer of Care (Signed)
Immediate Anesthesia Transfer of Care Note  Patient: Cindy [name]  Procedure(s) Performed: Procedure(s): COLONOSCOPY WITH PROPOFOL (N/A) ESOPHAGOGASTRODUODENOSCOPY (EGD) WITH PROPOFOL (N/A)  Patient Location: PACU and Endoscopy Unit  Anesthesia Type:General  Level of Consciousness: awake, alert  and oriented  Airway & Oxygen Therapy: Patient Spontanous Breathing and Patient connected to nasal cannula oxygen  Post-op Assessment: Report given to RN and Post -op Vital signs reviewed and stable  Post vital signs: Reviewed and stable  Last Vitals: There were no vitals filed for this visit.  Complications: No apparent anesthesia complications

## 2015-02-20 NOTE — Op Note (Signed)
[facility] Gastroenterology Patient Name: Cindy [name] Procedure Date: 02/20/2015 8:36 AM MRN: [id number] Account #: [id number] Date of Birth: 1950/12/10 Admit Type: Outpatient Age: 64 Room: [facility] ENDO ROOM 4 Gender: Female Note Status: Finalized Procedure:         Upper GI endoscopy Indications:       Dysphagia Providers:         Lupita Dawn. Candace Cruise, MD Referring MD:      Christena Flake. Raechel Ache, MD (Referring MD) Medicines:         Monitored Anesthesia Care Complications:     No immediate complications. Procedure:         Pre-Anesthesia Assessment:                    - Prior to the procedure, a History and Physical was                     performed, and patient medications, allergies and                     sensitivities were reviewed. The patient's tolerance of                     previous anesthesia was reviewed.                    - The risks and benefits of the procedure and the sedation                     options and risks were discussed with the patient. All                     questions were answered and informed consent was obtained.                    - After reviewing the risks and benefits, the patient was                     deemed in satisfactory condition to undergo the procedure.                    After obtaining informed consent, the endoscope was passed                     under direct vision. Throughout the procedure, the                     patient's blood pressure, pulse, and oxygen saturations                     were monitored continuously. The Endoscope was introduced                     through the mouth, and advanced to the second part of                     duodenum. The upper GI endoscopy was accomplished without                     difficulty. The patient tolerated the procedure well. Findings:      No endoscopic abnormality was evident in the esophagus to explain the       patient's complaint of dysphagia. It was decided, however, to proceed    with dilation of the entire esophagus.  The scope was withdrawn. Dilation       was performed with a Maloney dilator with mild resistance at 68 Fr.      The entire examined stomach was normal.      The examined duodenum was normal.      Nissen intact. Impression:        - No endoscopic esophageal abnormality to explain                     patient's dysphagia. Esophagus dilated. Dilated.                    - Normal stomach.                    - Normal examined duodenum.                    - No specimens collected. Recommendation:    - Discharge patient to home.                    - Observe patient's clinical course.                    - The findings and recommendations were discussed with the                     patient. Procedure Code(s): --- Professional ---                    [phone], Esophagogastroduodenoscopy, flexible, transoral;                     diagnostic, including collection of specimen(s) by                     brushing or washing, when performed (separate procedure)                    43450, Dilation of esophagus, by unguided sound or bougie,                     single or multiple passes Diagnosis Code(s): --- Professional ---                    R13.10, Dysphagia, unspecified CPT copyright 2014 American Medical Association. All rights reserved. The codes documented in this report are preliminary and upon coder review may  be revised to meet current compliance requirements. Hulen Luster, MD 02/20/2015 8:51:27 AM This report has been signed electronically. Number of Addenda: 0 Note Initiated On: 02/20/2015 8:36 AM      [facility]

## 2015-02-20 NOTE — H&P (Signed)
  Date of Initial H&P: 02/07/2015 History reviewed, patient examined, no change in status, stable for surgery.

## 2015-02-20 NOTE — Op Note (Signed)
[facility] Gastroenterology Patient Name: Cindy [name] Procedure Date: 02/20/2015 8:31 AM MRN: [id number] Account #: [id number] Date of Birth: 09-09-1950 Admit Type: Outpatient Age: 64 Room: [facility] ENDO ROOM 4 Gender: Female Note Status: Finalized Procedure:         Colonoscopy Indications:       Personal history of colonic polyps Providers:         Lupita Dawn. Candace Cruise, MD Referring MD:      Christena Flake. Raechel Ache, MD (Referring MD) Medicines:         Monitored Anesthesia Care Complications:     No immediate complications. Procedure:         Pre-Anesthesia Assessment:                    - Prior to the procedure, a History and Physical was                     performed, and patient medications, allergies and                     sensitivities were reviewed. The patient's tolerance of                     previous anesthesia was reviewed.                    - The risks and benefits of the procedure and the sedation                     options and risks were discussed with the patient. All                     questions were answered and informed consent was obtained.                    - After reviewing the risks and benefits, the patient was                     deemed in satisfactory condition to undergo the procedure.                    After obtaining informed consent, the colonoscope was                     passed under direct vision. Throughout the procedure, the                     patient's blood pressure, pulse, and oxygen saturations                     were monitored continuously. The Colonoscope was                     introduced through the anus and advanced to the the cecum,                     identified by appendiceal orifice and ileocecal valve. The                     colonoscopy was performed without difficulty. The patient                     tolerated the procedure well. The quality of the bowel  preparation was good. Findings:      The colon  (entire examined portion) appeared normal. Impression:        - The entire examined colon is normal.                    - No specimens collected. Recommendation:    - Discharge patient to home.                    - Repeat colonoscopy in 5 years for surveillance.                    - The findings and recommendations were discussed with the                     patient. Procedure Code(s): --- Professional ---                    [phone], Colonoscopy, flexible; diagnostic, including                     collection of specimen(s) by brushing or washing, when                     performed (separate procedure) Diagnosis Code(s): --- Professional ---                    Z86.010, Personal history of colonic polyps CPT copyright 2014 American Medical Association. All rights reserved. The codes documented in this report are preliminary and upon coder review may  be revised to meet current compliance requirements. Hulen Luster, MD 02/20/2015 9:10:50 AM This report has been signed electronically. Number of Addenda: 0 Note Initiated On: 02/20/2015 8:31 AM Scope Withdrawal Time: 0 hours 10 minutes 23 seconds  Total Procedure Duration: 0 hours 16 minutes 1 second       [facility]

## 2015-02-24 NOTE — Anesthesia Postprocedure Evaluation (Signed)
  Anesthesia Post-op Note  Patient: Cindy [name]  Procedure(s) Performed: Procedure(s): COLONOSCOPY WITH PROPOFOL (N/A) ESOPHAGOGASTRODUODENOSCOPY (EGD) WITH PROPOFOL (N/A)  Anesthesia type:General  Patient location: PACU  Post pain: Pain level controlled  Post assessment: Post-op Vital signs reviewed, Patient's Cardiovascular Status Stable, Respiratory Function Stable, Patent Airway and No signs of Nausea or vomiting  Post vital signs: Reviewed and stable  Last Vitals:  Filed Vitals:   02/20/15 0934  BP: 156/71  Pulse: 66  Temp:   Resp: 26    Level of consciousness: awake, alert  and patient cooperative  Complications: No apparent anesthesia complications

## 2015-06-09 ENCOUNTER — Encounter: Payer: Self-pay | Admitting: *Deleted

## 2015-06-09 ENCOUNTER — Emergency Department
Admission: EM | Admit: 2015-06-09 | Discharge: 2015-06-09 | Disposition: A | Discharge status: Home / Self Care | Payer: PPO | Attending: Emergency Medicine | Admitting: Emergency Medicine

## 2015-06-09 DIAGNOSIS — E1121 Type 2 diabetes mellitus with diabetic nephropathy: Secondary | ICD-10-CM | POA: Diagnosis not present

## 2015-06-09 DIAGNOSIS — Z79899 Other long term (current) drug therapy: Secondary | ICD-10-CM | POA: Diagnosis not present

## 2015-06-09 DIAGNOSIS — Z7984 Long term (current) use of oral hypoglycemic drugs: Secondary | ICD-10-CM | POA: Diagnosis not present

## 2015-06-09 DIAGNOSIS — Z792 Long term (current) use of antibiotics: Secondary | ICD-10-CM | POA: Insufficient documentation

## 2015-06-09 DIAGNOSIS — I10 Essential (primary) hypertension: Secondary | ICD-10-CM | POA: Diagnosis not present

## 2015-06-09 DIAGNOSIS — Z7951 Long term (current) use of inhaled steroids: Secondary | ICD-10-CM | POA: Diagnosis not present

## 2015-06-09 DIAGNOSIS — L0291 Cutaneous abscess, unspecified: Secondary | ICD-10-CM

## 2015-06-09 DIAGNOSIS — L0231 Cutaneous abscess of buttock: Secondary | ICD-10-CM | POA: Insufficient documentation

## 2015-06-09 MED ORDER — SULFAMETHOXAZOLE-TRIMETHOPRIM 800-160 MG PO TABS: 1.0000 | ORAL_TABLET | Freq: Two times a day (BID) | ORAL | Status: DC

## 2015-06-09 NOTE — ED Notes (Signed)
aBCESS on tailbone now has small areas on face, some fever, some dizziness and headache for a month, came in for abcess on face and back

## 2015-06-09 NOTE — Discharge Instructions (Signed)
Use warm moist compresses two abscess areas or sit in a warm tub of water. Continue taking tramadol as needed for pain. Bactrim twice a day for 10 days. Follow-up with Dr. Dorthula Perfect in 2 weeks if any continued problems.

## 2015-06-09 NOTE — ED Provider Notes (Signed)
[facility] Emergency Department Provider Note  ____________________________________________  Time seen: Approximately 12:50 PM  I have reviewed the triage vital signs and the nursing notes.   HISTORY  Chief Complaint Abscess  HPI Cindy [name] is a 65 y.o. female is here with complaint of multiple abscesses around her buttocks, abdomen and face over the last year. Patient states that she had one opened at her primary care office but nothing was said about MRSA at that time. She denies any known fever. Currently she is not on any medication for infection. She states these areas are extremely painful, had a white bump in the center, and are spreading. Currently she rates her pain as a 6/10.   Past Medical History  Diagnosis Date  . Sleep apnea   . Hypertension   . Depression   . Diabetes mellitus without complication ([place])   . GERD (gastroesophageal reflux disease)   . History of hiatal hernia   . Shortness of breath dyspnea   . Mitral regurgitation   . Chronic pain syndrome   . Obesity   . Iron deficiency anemia   . Microalbuminuric diabetic nephropathy ([place])     There are no active problems to display for this patient.   Past Surgical History  Procedure Laterality Date  . Back surgery    . Cardiac catheterization N/A 10/10/2014    Procedure: Left Heart Cath;  Surgeon: Isaias Cowman, MD;  Location: [place] [facility];  Service: Cardiovascular;  Laterality: N/A;  . Abdominal hysterectomy    . Nissen fundoplycation  11/99  . Hernia repair    . Carpal tunnell release  10/23/11  . Cardiac catheterization    . Laparoscopic ovarian cystectomy  08/2001  . Breast biopsy  03/2003  . Colonoscopy with propofol N/A 02/20/2015    Procedure: COLONOSCOPY WITH PROPOFOL;  Surgeon: Hulen Luster, MD;  Location: [facility] ENDOSCOPY;  Service: Gastroenterology;  Laterality: N/A;  . Esophagogastroduodenoscopy (egd) with propofol N/A 02/20/2015    Procedure:  ESOPHAGOGASTRODUODENOSCOPY (EGD) WITH PROPOFOL;  Surgeon: Hulen Luster, MD;  Location: [facility] ENDOSCOPY;  Service: Gastroenterology;  Laterality: N/A;    Current Outpatient Rx  Name  Route  Sig  Dispense  Refill  . acetaminophen (TYLENOL) 650 MG CR tablet   Oral   Take 650 mg by mouth every 8 (eight) hours as needed for pain.         Marland Kitchen amLODipine (NORVASC) 10 MG tablet   Oral   Take 10 mg by mouth daily.         Marland Kitchen amoxicillin (AMOXIL) 500 MG capsule      81 mg 1 day or 1 dose.         Marland Kitchen atorvastatin (LIPITOR) 40 MG tablet   Oral   Take 40 mg by mouth daily.         . cetirizine (ZYRTEC) 10 MG tablet   Oral   Take 10 mg by mouth daily.         . fluticasone (FLONASE) 50 MCG/ACT nasal spray   Each Nare   Place 2 sprays into both nostrils daily.         . furosemide (LASIX) 80 MG tablet   Oral   Take 80 mg by mouth 2 (two) times daily.         Marland Kitchen glimepiride (AMARYL) 2 MG tablet   Oral   Take 2 mg by mouth daily with breakfast.         .  lisinopril (PRINIVIL,ZESTRIL) 40 MG tablet   Oral   Take 40 mg by mouth daily.         . metFORMIN (GLUCOPHAGE) 1000 MG tablet   Oral   Take 1,000 mg by mouth 2 (two) times daily with a meal.         . metoprolol succinate (TOPROL-XL) 100 MG 24 hr tablet   Oral   Take 150 mg by mouth daily. Take with or immediately following a meal.         . minoxidil (LONITEN) 10 MG tablet   Oral   Take 10 mg by mouth daily.         . Multiple Vitamin (MULTIVITAMIN) capsule   Oral   Take 1 capsule by mouth daily.         Marland Kitchen omeprazole (PRILOSEC) 20 MG capsule   Oral   Take 20 mg by mouth daily.         Marland Kitchen PARoxetine (PAXIL) 40 MG tablet   Oral   Take 40 mg by mouth every morning.         . polyethylene glycol powder (GLYCOLAX/MIRALAX) powder   Oral   Take 17 g by mouth once.         . potassium chloride (KLOR-CON) 20 MEQ packet   Oral   Take 2 mEq by mouth 2 (two) times daily.         Marland Kitchen  sulfamethoxazole-trimethoprim (BACTRIM DS,SEPTRA DS) 800-160 MG tablet   Oral   Take 1 tablet by mouth 2 (two) times daily.   20 tablet   0   . tiZANidine (ZANAFLEX) 4 MG capsule   Oral   Take 4 mg by mouth 2 (two) times daily at 10 AM and 5 PM.         . traMADol (ULTRAM) 50 MG tablet   Oral   Take 50 mg by mouth 3 (three) times daily.           Allergies Vicoprofen and Vicodin  Family History  Problem Relation Age of Onset  . Heart attack Mother   . Hypertension Father   . Stroke Father   . Heart attack Brother     Social History Social History  Substance Use Topics  . Smoking status: Never Smoker   . Smokeless tobacco: Current User    Types: Snuff  . Alcohol Use: No    Review of Systems Constitutional: No fever/chills Eyes: No visual changes. Cardiovascular: Denies chest pain. Respiratory: Denies shortness of breath. Gastrointestinal:   No nausea, no vomiting.   Musculoskeletal: Negative for back pain. Skin: Multiple abscesses Neurological: Negative for headaches, focal weakness or numbness.  10-point ROS otherwise negative.  ____________________________________________   PHYSICAL EXAM:  VITAL SIGNS: ED Triage Vitals  Enc Vitals Group     BP 06/09/15 1157 154/64 mmHg     Pulse Rate 06/09/15 1155 62     Resp 06/09/15 1155 20     Temp 06/09/15 1155 98.2 F (36.8 C)     Temp Source 06/09/15 1155 Oral     SpO2 06/09/15 1157 95 %     Weight 06/09/15 1155 245 lb (111.131 kg)     Height 06/09/15 1155 5' (1.524 m)     Head Cir --      Peak Flow --      Pain Score 06/09/15 1156 6     Pain Loc --      Pain Edu? --      Excl. in  GC? --     Constitutional: Alert and oriented. Well appearing and in no acute distress. Eyes: Conjunctivae are normal. PERRL. EOMI. Head: Atraumatic. Nose: No congestion/rhinnorhea. Neck: No stridor.   Cardiovascular: Normal rate, regular rhythm. Grossly normal heart sounds.  Good peripheral circulation. Respiratory:  Normal respiratory effort.  No retractions. Lungs CTAB. Gastrointestinal: Soft and nontender. No distention. Musculoskeletal: Moves upper and lower extremities without any difficulty. Neurologic:  Normal speech and language. No gross focal neurologic deficits are appreciated. No gait instability. Skin:  Skin is warm, dry and intact. Multiple papular lesions bilateral medial buttocks and very stages of healing. There is no active draining and there is no areas that are fluctuant. Most are tender to palpation. Psychiatric: Mood and affect are normal. Speech and behavior are normal.  ____________________________________________   LABS (all labs ordered are listed, but only abnormal results are displayed)  Labs Reviewed - No data to display  PROCEDURES  Procedure(s) performed: None  Critical Care performed: No  ____________________________________________   INITIAL IMPRESSION / ASSESSMENT AND PLAN / ED COURSE  Pertinent labs & imaging results that were available during my care of the patient were reviewed by me and considered in my medical decision making (see chart for details).  Patient was given Bactrim DS twice a day for 10 days along with instructions to take her tramadol that she has a home for pain if needed. She is also use warm moist compresses to the areas and also follow-up with Dr.Theis if any continued problems. ____________________________________________   FINAL CLINICAL IMPRESSION(S) / ED DIAGNOSES  Final diagnoses:  Abscess of multiple sites      Johnn Hai, PA-C 06/09/15 1502  Lavonia Drafts, MD 06/09/15 [phone]

## 2015-06-09 NOTE — ED Notes (Signed)
Abscess noted to buttocks for several weeks    Also had some fever

## 2015-07-16 DIAGNOSIS — H25099 Other age-related incipient cataract, unspecified eye: Secondary | ICD-10-CM | POA: Diagnosis not present

## 2015-07-16 DIAGNOSIS — H2510 Age-related nuclear cataract, unspecified eye: Secondary | ICD-10-CM | POA: Diagnosis not present

## 2015-07-16 DIAGNOSIS — H5203 Hypermetropia, bilateral: Secondary | ICD-10-CM | POA: Diagnosis not present

## 2015-07-16 DIAGNOSIS — H52223 Regular astigmatism, bilateral: Secondary | ICD-10-CM | POA: Diagnosis not present

## 2015-08-13 DIAGNOSIS — G4733 Obstructive sleep apnea (adult) (pediatric): Secondary | ICD-10-CM | POA: Diagnosis not present

## 2015-09-01 DIAGNOSIS — Z1239 Encounter for other screening for malignant neoplasm of breast: Secondary | ICD-10-CM | POA: Diagnosis not present

## 2015-09-01 DIAGNOSIS — M5136 Other intervertebral disc degeneration, lumbar region: Secondary | ICD-10-CM | POA: Diagnosis not present

## 2015-09-01 DIAGNOSIS — E1129 Type 2 diabetes mellitus with other diabetic kidney complication: Secondary | ICD-10-CM | POA: Diagnosis not present

## 2015-09-01 DIAGNOSIS — D509 Iron deficiency anemia, unspecified: Secondary | ICD-10-CM | POA: Diagnosis not present

## 2015-09-01 DIAGNOSIS — K219 Gastro-esophageal reflux disease without esophagitis: Secondary | ICD-10-CM | POA: Diagnosis not present

## 2015-09-01 DIAGNOSIS — G4733 Obstructive sleep apnea (adult) (pediatric): Secondary | ICD-10-CM | POA: Diagnosis not present

## 2015-09-01 DIAGNOSIS — I10 Essential (primary) hypertension: Secondary | ICD-10-CM | POA: Diagnosis not present

## 2015-09-01 DIAGNOSIS — R809 Proteinuria, unspecified: Secondary | ICD-10-CM | POA: Diagnosis not present

## 2015-09-01 DIAGNOSIS — E7800 Pure hypercholesterolemia, unspecified: Secondary | ICD-10-CM | POA: Diagnosis not present

## 2015-09-01 DIAGNOSIS — Z79899 Other long term (current) drug therapy: Secondary | ICD-10-CM | POA: Diagnosis not present

## 2015-09-01 DIAGNOSIS — Z1231 Encounter for screening mammogram for malignant neoplasm of breast: Secondary | ICD-10-CM | POA: Diagnosis not present

## 2015-09-01 DIAGNOSIS — J309 Allergic rhinitis, unspecified: Secondary | ICD-10-CM | POA: Diagnosis not present

## 2015-09-02 ENCOUNTER — Other Ambulatory Visit: Payer: Self-pay | Admitting: Internal Medicine

## 2015-09-02 DIAGNOSIS — Z1231 Encounter for screening mammogram for malignant neoplasm of breast: Secondary | ICD-10-CM

## 2015-09-07 ENCOUNTER — Emergency Department: Payer: PPO

## 2015-09-07 ENCOUNTER — Observation Stay
Admission: EM | Admit: 2015-09-07 | Discharge: 2015-09-08 | Disposition: A | Discharge status: Home / Self Care | Payer: PPO | Attending: Internal Medicine | Admitting: Internal Medicine

## 2015-09-07 DIAGNOSIS — I10 Essential (primary) hypertension: Secondary | ICD-10-CM | POA: Diagnosis not present

## 2015-09-07 DIAGNOSIS — D509 Iron deficiency anemia, unspecified: Secondary | ICD-10-CM | POA: Insufficient documentation

## 2015-09-07 DIAGNOSIS — Z794 Long term (current) use of insulin: Secondary | ICD-10-CM | POA: Insufficient documentation

## 2015-09-07 DIAGNOSIS — I340 Nonrheumatic mitral (valve) insufficiency: Secondary | ICD-10-CM | POA: Insufficient documentation

## 2015-09-07 DIAGNOSIS — Z90710 Acquired absence of both cervix and uterus: Secondary | ICD-10-CM | POA: Insufficient documentation

## 2015-09-07 DIAGNOSIS — M542 Cervicalgia: Secondary | ICD-10-CM | POA: Diagnosis not present

## 2015-09-07 DIAGNOSIS — Z7982 Long term (current) use of aspirin: Secondary | ICD-10-CM | POA: Diagnosis not present

## 2015-09-07 DIAGNOSIS — I517 Cardiomegaly: Secondary | ICD-10-CM | POA: Insufficient documentation

## 2015-09-07 DIAGNOSIS — R42 Dizziness and giddiness: Secondary | ICD-10-CM | POA: Diagnosis not present

## 2015-09-07 DIAGNOSIS — R0602 Shortness of breath: Secondary | ICD-10-CM | POA: Insufficient documentation

## 2015-09-07 DIAGNOSIS — Z8249 Family history of ischemic heart disease and other diseases of the circulatory system: Secondary | ICD-10-CM | POA: Insufficient documentation

## 2015-09-07 DIAGNOSIS — I480 Paroxysmal atrial fibrillation: Secondary | ICD-10-CM | POA: Insufficient documentation

## 2015-09-07 DIAGNOSIS — E119 Type 2 diabetes mellitus without complications: Secondary | ICD-10-CM | POA: Diagnosis not present

## 2015-09-07 DIAGNOSIS — Z823 Family history of stroke: Secondary | ICD-10-CM | POA: Diagnosis not present

## 2015-09-07 DIAGNOSIS — Z885 Allergy status to narcotic agent status: Secondary | ICD-10-CM | POA: Diagnosis not present

## 2015-09-07 DIAGNOSIS — R55 Syncope and collapse: Secondary | ICD-10-CM | POA: Diagnosis not present

## 2015-09-07 DIAGNOSIS — G894 Chronic pain syndrome: Secondary | ICD-10-CM | POA: Diagnosis not present

## 2015-09-07 DIAGNOSIS — R079 Chest pain, unspecified: Principal | ICD-10-CM

## 2015-09-07 DIAGNOSIS — R0989 Other specified symptoms and signs involving the circulatory and respiratory systems: Secondary | ICD-10-CM | POA: Insufficient documentation

## 2015-09-07 DIAGNOSIS — E669 Obesity, unspecified: Secondary | ICD-10-CM | POA: Insufficient documentation

## 2015-09-07 DIAGNOSIS — Z743 Need for continuous supervision: Secondary | ICD-10-CM | POA: Diagnosis not present

## 2015-09-07 DIAGNOSIS — I4581 Long QT syndrome: Secondary | ICD-10-CM | POA: Diagnosis not present

## 2015-09-07 DIAGNOSIS — G9389 Other specified disorders of brain: Secondary | ICD-10-CM | POA: Insufficient documentation

## 2015-09-07 DIAGNOSIS — F329 Major depressive disorder, single episode, unspecified: Secondary | ICD-10-CM | POA: Diagnosis not present

## 2015-09-07 DIAGNOSIS — Z7901 Long term (current) use of anticoagulants: Secondary | ICD-10-CM | POA: Insufficient documentation

## 2015-09-07 DIAGNOSIS — J811 Chronic pulmonary edema: Secondary | ICD-10-CM | POA: Diagnosis not present

## 2015-09-07 DIAGNOSIS — K219 Gastro-esophageal reflux disease without esophagitis: Secondary | ICD-10-CM | POA: Diagnosis not present

## 2015-09-07 DIAGNOSIS — G4730 Sleep apnea, unspecified: Secondary | ICD-10-CM | POA: Insufficient documentation

## 2015-09-07 DIAGNOSIS — R112 Nausea with vomiting, unspecified: Secondary | ICD-10-CM | POA: Diagnosis not present

## 2015-09-07 DIAGNOSIS — Z79899 Other long term (current) drug therapy: Secondary | ICD-10-CM | POA: Diagnosis not present

## 2015-09-07 DIAGNOSIS — R61 Generalized hyperhidrosis: Secondary | ICD-10-CM | POA: Diagnosis not present

## 2015-09-07 DIAGNOSIS — K449 Diaphragmatic hernia without obstruction or gangrene: Secondary | ICD-10-CM | POA: Diagnosis not present

## 2015-09-07 DIAGNOSIS — D169 Benign neoplasm of bone and articular cartilage, unspecified: Secondary | ICD-10-CM | POA: Diagnosis not present

## 2015-09-07 DIAGNOSIS — I4891 Unspecified atrial fibrillation: Secondary | ICD-10-CM | POA: Diagnosis not present

## 2015-09-07 DIAGNOSIS — R110 Nausea: Secondary | ICD-10-CM | POA: Diagnosis not present

## 2015-09-07 DIAGNOSIS — R0789 Other chest pain: Secondary | ICD-10-CM | POA: Diagnosis not present

## 2015-09-07 DIAGNOSIS — I25110 Atherosclerotic heart disease of native coronary artery with unstable angina pectoris: Secondary | ICD-10-CM | POA: Diagnosis not present

## 2015-09-07 DIAGNOSIS — Z6841 Body Mass Index (BMI) 40.0 and over, adult: Secondary | ICD-10-CM | POA: Diagnosis not present

## 2015-09-07 DIAGNOSIS — R1084 Generalized abdominal pain: Secondary | ICD-10-CM | POA: Diagnosis not present

## 2015-09-07 LAB — CBC WITH DIFFERENTIAL/PLATELET
BASOS ABS: 0 10*3/uL (ref 0–0.1)
BASOS PCT: 0 %
Eosinophils Absolute: 0.1 10*3/uL (ref 0–0.7)
Eosinophils Relative: 1 %
HEMATOCRIT: 34.5 % — AB (ref 35.0–47.0)
HEMOGLOBIN: 11.2 g/dL — AB (ref 12.0–16.0)
Lymphocytes Relative: 19 %
Lymphs Abs: 1.1 10*3/uL (ref 1.0–3.6)
MCH: 26.8 pg (ref 26.0–34.0)
MCHC: 32.4 g/dL (ref 32.0–36.0)
MCV: 82.8 fL (ref 80.0–100.0)
MONOS PCT: 7 %
Monocytes Absolute: 0.4 10*3/uL (ref 0.2–0.9)
NEUTROS ABS: 4.3 10*3/uL (ref 1.4–6.5)
NEUTROS PCT: 73 %
Platelets: 231 10*3/uL (ref 150–440)
RBC: 4.16 MIL/uL (ref 3.80–5.20)
RDW: 15.9 % — ABNORMAL HIGH (ref 11.5–14.5)
WBC: 5.9 10*3/uL (ref 3.6–11.0)

## 2015-09-07 LAB — COMPREHENSIVE METABOLIC PANEL
ALBUMIN: 3.7 g/dL (ref 3.5–5.0)
ALK PHOS: 84 U/L (ref 38–126)
ALT: 16 U/L (ref 14–54)
AST: 16 U/L (ref 15–41)
Anion gap: 4 — ABNORMAL LOW (ref 5–15)
BILIRUBIN TOTAL: 0.7 mg/dL (ref 0.3–1.2)
BUN: 11 mg/dL (ref 6–20)
CHLORIDE: 101 mmol/L (ref 101–111)
CO2: 31 mmol/L (ref 22–32)
Calcium: 9 mg/dL (ref 8.9–10.3)
Creatinine, Ser: 0.71 mg/dL (ref 0.44–1.00)
GFR calc Af Amer: >60 mL/min (ref >60)
GFR calc non Af Amer: >60 mL/min (ref >60)
Glucose, Bld: 242 mg/dL — ABNORMAL HIGH (ref 65–99)
Potassium: 3.1 mmol/L — ABNORMAL LOW (ref 3.5–5.1)
SODIUM: 136 mmol/L (ref 135–145)
Total Protein: 7.4 g/dL (ref 6.5–8.1)

## 2015-09-07 LAB — URINALYSIS COMPLETE WITH MICROSCOPIC (ARMC ONLY)
BILIRUBIN URINE: NEGATIVE
Bacteria, UA: NONE SEEN
Glucose, UA: 150 mg/dL — AB
HGB URINE DIPSTICK: NEGATIVE
KETONES UR: NEGATIVE mg/dL
NITRITE: NEGATIVE
PH: 6 (ref 5.0–8.0)
Protein, ur: 30 mg/dL — AB
Specific Gravity, Urine: 1.015 (ref 1.005–1.030)

## 2015-09-07 LAB — GLUCOSE, CAPILLARY
Glucose-Capillary: 196 mg/dL — ABNORMAL HIGH (ref 65–99)
Glucose-Capillary: 305 mg/dL — ABNORMAL HIGH (ref 65–99)
Glucose-Capillary: 157 mg/dL — ABNORMAL HIGH (ref 65–99)

## 2015-09-07 LAB — TROPONIN I
Troponin I: 0.04 ng/mL — ABNORMAL HIGH (ref <0.031)
Troponin I: 0.03 ng/mL (ref <0.031)

## 2015-09-07 LAB — HEMOGLOBIN A1C: Hgb A1c MFr Bld: 7.9 % — ABNORMAL HIGH (ref 4.0–6.0)

## 2015-09-07 LAB — TSH: TSH: 2.185 u[IU]/mL (ref 0.350–4.500)

## 2015-09-07 MED ORDER — APIXABAN 5 MG PO TABS
5.0000 mg | ORAL_TABLET | Freq: Two times a day (BID) | ORAL | Status: DC
  Administered 2015-09-07 – 2015-09-08 (×3): 5 mg via ORAL
  Filled 2015-09-07 (×5): qty 1

## 2015-09-07 MED ORDER — METOPROLOL SUCCINATE ER 50 MG PO TB24: 50.0000 mg | ORAL_TABLET | Freq: Two times a day (BID) | ORAL | Status: DC

## 2015-09-07 MED ORDER — METOPROLOL SUCCINATE ER 100 MG PO TB24
100.0000 mg | ORAL_TABLET | Freq: Every morning | ORAL | Status: DC
  Administered 2015-09-08: 100 mg via ORAL
  Filled 2015-09-07 (×2): qty 1

## 2015-09-07 MED ORDER — MORPHINE SULFATE (PF) 2 MG/ML IV SOLN: 1.0000 mg | INTRAVENOUS | Status: DC | PRN

## 2015-09-07 MED ORDER — SODIUM CHLORIDE 0.9 % IV BOLUS (SEPSIS)
500.0000 mL | Freq: Once | INTRAVENOUS | Status: AC
  Administered 2015-09-07: 500 mL via INTRAVENOUS

## 2015-09-07 MED ORDER — ADULT MULTIVITAMIN W/MINERALS CH
1.0000 | ORAL_TABLET | Freq: Every day | ORAL | Status: DC
  Administered 2015-09-07 – 2015-09-08 (×2): 1 via ORAL
  Filled 2015-09-07: qty 1

## 2015-09-07 MED ORDER — LORATADINE 10 MG PO TABS
10.0000 mg | ORAL_TABLET | Freq: Every day | ORAL | Status: DC
  Administered 2015-09-07 – 2015-09-08 (×2): 10 mg via ORAL
  Filled 2015-09-07 (×2): qty 1

## 2015-09-07 MED ORDER — LISINOPRIL 20 MG PO TABS
40.0000 mg | ORAL_TABLET | Freq: Every day | ORAL | Status: DC
  Administered 2015-09-07 – 2015-09-08 (×2): 40 mg via ORAL
  Filled 2015-09-07 (×2): qty 2

## 2015-09-07 MED ORDER — AMLODIPINE BESYLATE 10 MG PO TABS
10.0000 mg | ORAL_TABLET | Freq: Every day | ORAL | Status: DC
  Administered 2015-09-07 – 2015-09-08 (×2): 10 mg via ORAL
  Filled 2015-09-07 (×2): qty 1

## 2015-09-07 MED ORDER — FUROSEMIDE 40 MG PO TABS
80.0000 mg | ORAL_TABLET | Freq: Two times a day (BID) | ORAL | Status: DC
  Administered 2015-09-07 – 2015-09-08 (×3): 80 mg via ORAL
  Filled 2015-09-07 (×3): qty 2

## 2015-09-07 MED ORDER — ASPIRIN EC 81 MG PO TBEC
81.0000 mg | DELAYED_RELEASE_TABLET | Freq: Every day | ORAL | Status: DC
  Administered 2015-09-07 – 2015-09-08 (×2): 81 mg via ORAL
  Filled 2015-09-07 (×2): qty 1

## 2015-09-07 MED ORDER — POTASSIUM CHLORIDE CRYS ER 20 MEQ PO TBCR
40.0000 meq | EXTENDED_RELEASE_TABLET | Freq: Two times a day (BID) | ORAL | Status: DC
Start: 2015-09-07 — End: 2015-09-08
  Administered 2015-09-07 – 2015-09-08 (×3): 40 meq via ORAL
  Filled 2015-09-07 (×3): qty 2

## 2015-09-07 MED ORDER — FLUTICASONE PROPIONATE 50 MCG/ACT NA SUSP
2.0000 | Freq: Every day | NASAL | Status: DC
  Administered 2015-09-07 – 2015-09-08 (×2): 2 via NASAL
  Filled 2015-09-07: qty 16

## 2015-09-07 MED ORDER — MONTELUKAST SODIUM 10 MG PO TABS: 10.0000 mg | ORAL_TABLET | Freq: Every day | ORAL | Status: DC | PRN

## 2015-09-07 MED ORDER — POLYETHYLENE GLYCOL 3350 17 GM/SCOOP PO POWD
17.0000 g | Freq: Every day | ORAL | Status: DC
  Filled 2015-09-07: qty 255

## 2015-09-07 MED ORDER — ACETAMINOPHEN 650 MG RE SUPP: 650.0000 mg | Freq: Four times a day (QID) | RECTAL | Status: DC | PRN

## 2015-09-07 MED ORDER — ONDANSETRON HCL 4 MG/2ML IJ SOLN
4.0000 mg | Freq: Once | INTRAMUSCULAR | Status: AC
  Administered 2015-09-07: 4 mg via INTRAVENOUS
  Filled 2015-09-07: qty 2

## 2015-09-07 MED ORDER — ONDANSETRON HCL 4 MG PO TABS: 4.0000 mg | ORAL_TABLET | Freq: Four times a day (QID) | ORAL | Status: DC | PRN

## 2015-09-07 MED ORDER — POTASSIUM CHLORIDE CRYS ER 20 MEQ PO TBCR
40.0000 meq | EXTENDED_RELEASE_TABLET | Freq: Once | ORAL | Status: AC
  Administered 2015-09-07: 40 meq via ORAL
  Filled 2015-09-07: qty 2

## 2015-09-07 MED ORDER — APIXABAN 5 MG PO TABS: 5.0000 mg | ORAL_TABLET | Freq: Two times a day (BID) | ORAL | Status: DC

## 2015-09-07 MED ORDER — ONDANSETRON HCL 4 MG/2ML IJ SOLN: 4.0000 mg | Freq: Four times a day (QID) | INTRAMUSCULAR | Status: DC | PRN

## 2015-09-07 MED ORDER — ACETAMINOPHEN 325 MG PO TABS: 650.0000 mg | ORAL_TABLET | Freq: Four times a day (QID) | ORAL | Status: DC | PRN

## 2015-09-07 MED ORDER — PAROXETINE HCL 20 MG PO TABS
40.0000 mg | ORAL_TABLET | ORAL | Status: DC
  Administered 2015-09-07 – 2015-09-08 (×2): 40 mg via ORAL
  Filled 2015-09-07 (×3): qty 2

## 2015-09-07 MED ORDER — SODIUM CHLORIDE 0.9% FLUSH
3.0000 mL | Freq: Two times a day (BID) | INTRAVENOUS | Status: DC
  Administered 2015-09-07 – 2015-09-08 (×3): 3 mL via INTRAVENOUS

## 2015-09-07 MED ORDER — INSULIN ASPART 100 UNIT/ML ~~LOC~~ SOLN
0.0000 [IU] | Freq: Three times a day (TID) | SUBCUTANEOUS | Status: DC
  Administered 2015-09-07: 11 [IU] via SUBCUTANEOUS
  Administered 2015-09-07: 3 [IU] via SUBCUTANEOUS
  Administered 2015-09-08: 5 [IU] via SUBCUTANEOUS
  Administered 2015-09-08: 8 [IU] via SUBCUTANEOUS
  Filled 2015-09-07: qty 5
  Filled 2015-09-07: qty 3
  Filled 2015-09-07: qty 11
  Filled 2015-09-07: qty 8

## 2015-09-07 MED ORDER — POLYETHYLENE GLYCOL 3350 17 G PO PACK
17.0000 g | PACK | Freq: Every day | ORAL | Status: DC
  Administered 2015-09-07 – 2015-09-08 (×2): 17 g via ORAL
  Filled 2015-09-07 (×3): qty 1

## 2015-09-07 MED ORDER — ATORVASTATIN CALCIUM 20 MG PO TABS
40.0000 mg | ORAL_TABLET | Freq: Every day | ORAL | Status: DC
  Administered 2015-09-07 – 2015-09-08 (×2): 40 mg via ORAL
  Filled 2015-09-07 (×2): qty 2

## 2015-09-07 MED ORDER — TIZANIDINE HCL 2 MG PO TABS
4.0000 mg | ORAL_TABLET | Freq: Two times a day (BID) | ORAL | Status: DC | PRN
Start: 2015-09-07 — End: 2015-09-08
  Filled 2015-09-07: qty 2

## 2015-09-07 MED ORDER — PANTOPRAZOLE SODIUM 40 MG PO TBEC
40.0000 mg | DELAYED_RELEASE_TABLET | Freq: Every day | ORAL | Status: DC
  Administered 2015-09-07 – 2015-09-08 (×2): 40 mg via ORAL
  Filled 2015-09-07 (×2): qty 1

## 2015-09-07 MED ORDER — INSULIN ASPART 100 UNIT/ML ~~LOC~~ SOLN: 0.0000 [IU] | Freq: Every day | SUBCUTANEOUS | Status: DC

## 2015-09-07 MED ORDER — TRAMADOL HCL 50 MG PO TABS
50.0000 mg | ORAL_TABLET | Freq: Three times a day (TID) | ORAL | Status: DC | PRN
  Administered 2015-09-07 – 2015-09-08 (×2): 50 mg via ORAL
  Filled 2015-09-07 (×2): qty 1

## 2015-09-07 MED ORDER — MINOXIDIL 10 MG PO TABS
10.0000 mg | ORAL_TABLET | Freq: Every day | ORAL | Status: DC
  Administered 2015-09-07 – 2015-09-08 (×2): 10 mg via ORAL
  Filled 2015-09-07 (×2): qty 1

## 2015-09-07 MED ORDER — METOPROLOL SUCCINATE ER 50 MG PO TB24
50.0000 mg | ORAL_TABLET | Freq: Every evening | ORAL | Status: DC
  Administered 2015-09-07: 50 mg via ORAL

## 2015-09-07 NOTE — H&P (Signed)
Cindy [name] is an 65 y.o. female.   Chief Complaint: Chest pain HPI: The patient presents emergency department complaining of chest pain that began at rest. The patient was sitting in bed when she felt substernal pressure radiating to her back into her left shoulder. The patient states that her left jaw also began to hurt. She felt nauseous at the time but did not vomit. She denies shortness of breath. Notably she has felt episodes of dizziness and lightheadedness associated with nausea and vomiting throughout the day for more than a week. These episodes are unpredictable. Past medical history is significant for coronary artery disease status post heart catheterization in May 2016 which showed a 30% mid LAD lesion, diabetes mellitus type 2 and hypertension. In the emergency department the patient was found to have A. fib with heart rate controlled in the 70s. Initial troponin was negative but the patient continues to feel a pain in her chest similar to "didn't need to burp but being unable to". Due to her cardiac risk factors as well as continued symptoms the emergency department staff called for admission.  Past Medical History  Diagnosis Date  . Sleep apnea   . Hypertension   . Depression   . Diabetes mellitus without complication ([place])   . GERD (gastroesophageal reflux disease)   . History of hiatal hernia   . Shortness of breath dyspnea   . Mitral regurgitation   . Chronic pain syndrome   . Obesity   . Iron deficiency anemia   . Microalbuminuric diabetic nephropathy [facility])     Past Surgical History  Procedure Laterality Date  . Back surgery    . Cardiac catheterization N/A 10/10/2014    Procedure: Left Heart Cath;  Surgeon: Isaias Cowman, MD;  Location: [place] [facility];  Service: Cardiovascular;  Laterality: N/A;  . Abdominal hysterectomy    . Nissen fundoplycation  11/99  . Hernia repair    . Carpal tunnell release  10/23/11  . Cardiac catheterization    . Laparoscopic  ovarian cystectomy  08/2001  . Breast biopsy  03/2003  . Colonoscopy with propofol N/A 02/20/2015    Procedure: COLONOSCOPY WITH PROPOFOL;  Surgeon: Hulen Luster, MD;  Location: [facility] ENDOSCOPY;  Service: Gastroenterology;  Laterality: N/A;  . Esophagogastroduodenoscopy (egd) with propofol N/A 02/20/2015    Procedure: ESOPHAGOGASTRODUODENOSCOPY (EGD) WITH PROPOFOL;  Surgeon: Hulen Luster, MD;  Location: [facility] END[facility]COPY;  Service: Gastroenterology;  Laterality: N/A;    Family History  Problem Relation Age of Onset  . Heart attack Mother   . Hypertension Father   . Stroke Father   . Heart attack Brother    Social History:  reports that she has never smoked. Her smokeless tobacco use includes Snuff. She reports that she does not drink alcohol or use illicit drugs.  Allergies:  Allergies  Allergen Reactions  . Vicodin [Hydrocodone-Acetaminophen] Other (See Comments)    Reaction: racing heart  . Vicoprofen [Hydrocodone-Ibuprofen] Other (See Comments)    Reaction: racing heart    Prior to Admission medications   Medication Sig Start Date End Date Taking? Authorizing Provider  acetaminophen (TYLENOL) 650 MG CR tablet Take 650 mg by mouth every 8 (eight) hours as needed for pain.   Yes Historical Provider, MD  amLODipine (NORVASC) 10 MG tablet Take 10 mg by mouth daily.   Yes Historical Provider, MD  aspirin EC 81 MG tablet Take 81 mg by mouth daily.   Yes Historical Provider, MD  atorvastatin (LIPITOR) 40 MG  tablet Take 40 mg by mouth daily.   Yes Historical Provider, MD  cetirizine (ZYRTEC) 10 MG tablet Take 10 mg by mouth daily.   Yes Historical Provider, MD  fluticasone (FLONASE) 50 MCG/ACT nasal spray Place 2 sprays into both nostrils daily.   Yes Historical Provider, MD  furosemide (LASIX) 80 MG tablet Take 80 mg by mouth 2 (two) times daily.   Yes Historical Provider, MD  glimepiride (AMARYL) 2 MG tablet Take 2-4 mg by mouth 2 (two) times daily. Take 9m in the morning and 268min the evening    Yes Historical Provider, MD  lisinopril (PRINIVIL,ZESTRIL) 40 MG tablet Take 40 mg by mouth daily.   Yes Historical Provider, MD  meloxicam (MOBIC) 7.5 MG tablet Take 7.5 mg by mouth daily.   Yes Historical Provider, MD  metFORMIN (GLUCOPHAGE) 1000 MG tablet Take 1,000 mg by mouth 2 (two) times daily with a meal.   Yes Historical Provider, MD  metoprolol succinate (TOPROL-XL) 100 MG 24 hr tablet Take 50-100 mg by mouth 2 (two) times daily. Take 1 tablet in the morning and one-half tablet in the evening. Take with or immediately following a meal.   Yes Historical Provider, MD  minoxidil (LONITEN) 10 MG tablet Take 10 mg by mouth daily.   Yes Historical Provider, MD  montelukast (SINGULAIR) 10 MG tablet Take 10 mg by mouth daily as needed (allergies).  06/16/15  Yes Historical Provider, MD  Multiple Vitamin (MULTIVITAMIN WITH MINERALS) TABS tablet Take 1 tablet by mouth daily.   Yes Historical Provider, MD  omeprazole (PRILOSEC) 20 MG capsule Take 20 mg by mouth daily as needed (heartburn).    Yes Historical Provider, MD  PARoxetine (PAXIL) 40 MG tablet Take 40 mg by mouth every morning.   Yes Historical Provider, MD  polyethylene glycol powder (GLYCOLAX/MIRALAX) powder Take 17 g by mouth daily.    Yes Historical Provider, MD  potassium chloride SA (K-DUR,KLOR-CON) 20 MEQ tablet Take 40 mEq by mouth 2 (two) times daily.   Yes Historical Provider, MD  tiZANidine (ZANAFLEX) 4 MG capsule Take 4 mg by mouth 2 (two) times daily as needed for muscle spasms.    Yes Historical Provider, MD  traMADol (ULTRAM) 50 MG tablet Take 50 mg by mouth 3 (three) times daily as needed for moderate pain.    Yes Historical Provider, MD     Results for orders placed or performed during the hospital encounter of 09/07/15 (from the past 48 hour(s))  CBC with Differential     Status: Abnormal   Collection Time: 09/07/15  3:13 AM  Result Value Ref Range   WBC 5.9 3.6 - 11.0 K/uL   RBC 4.16 3.80 - 5.20 MIL/uL   Hemoglobin  11.2 (L) 12.0 - 16.0 g/dL   HCT 34.5 (L) 35.0 - 47.0 %   MCV 82.8 80.0 - 100.0 fL   MCH 26.8 26.0 - 34.0 pg   MCHC 32.4 32.0 - 36.0 g/dL   RDW 15.9 (H) 11.5 - 14.5 %   Platelets 231 150 - 440 K/uL   Neutrophils Relative % 73 %   Neutro Abs 4.3 1.4 - 6.5 K/uL   Lymphocytes Relative 19 %   Lymphs Abs 1.1 1.0 - 3.6 K/uL   Monocytes Relative 7 %   Monocytes Absolute 0.4 0.2 - 0.9 K/uL   Eosinophils Relative 1 %   Eosinophils Absolute 0.1 0 - 0.7 K/uL   Basophils Relative 0 %   Basophils Absolute 0.0 0 - 0.1 K/uL  Comprehensive metabolic panel     Status: Abnormal   Collection Time: 09/07/15  3:13 AM  Result Value Ref Range   Sodium 136 135 - 145 mmol/L   Potassium 3.1 (L) 3.5 - 5.1 mmol/L   Chloride 101 101 - 111 mmol/L   CO2 31 22 - 32 mmol/L   Glucose, Bld 242 (H) 65 - 99 mg/dL   BUN 11 6 - 20 mg/dL   Creatinine, Ser 0.71 0.44 - 1.00 mg/dL   Calcium 9.0 8.9 - 10.3 mg/dL   Total Protein 7.4 6.5 - 8.1 g/dL   Albumin 3.7 3.5 - 5.0 g/dL   AST 16 15 - 41 U/L   ALT 16 14 - 54 U/L   Alkaline Phosphatase 84 38 - 126 U/L   Total Bilirubin 0.7 0.3 - 1.2 mg/dL   GFR calc non Af Amer >60 >60 mL/min   GFR calc Af Amer >60 >60 mL/min    Comment: (NOTE) The eGFR has been calculated using the CKD EPI equation. This calculation has not been validated in all clinical situations. eGFR's persistently <60 mL/min signify possible Chronic Kidney Disease.    Anion gap 4 (L) 5 - 15  Troponin I     Status: None   Collection Time: 09/07/15  3:13 AM  Result Value Ref Range   Troponin I <0.03 <0.031 ng/mL    Comment:        NO INDICATION OF MYOCARDIAL INJURY.    Ct Head Wo Contrast  09/07/2015  CLINICAL DATA:  Dizziness, nausea, LEFT-sided pain. History of hypertension, diabetes. EXAM: CT HEAD WITHOUT CONTRAST TECHNIQUE: Contiguous axial images were obtained from the base of the skull through the vertex without intravenous contrast. COMPARISON:  MRI of the head June 25, 2009 FINDINGS:  INTRACRANIAL CONTENTS: Mild ventriculomegaly, advanced from prior imaging, on the basis of global parenchymal brain volume loss. No intraparenchymal hemorrhage, mass effect nor midline shift. Patchy supratentorial white matter hypodensities are within normal range for patient's age and though non-specific likely represent chronic small vessel ischemic disease. No acute large vascular territory infarcts. No abnormal extra-axial fluid collections. Basal cisterns are patent. Trace calcific atherosclerosis of the carotid siphons. ORBITS: The included ocular globes and orbital contents are non-suspicious. SINUSES: Mild paranasal sinus mucosal thickening. Subcentimeter LEFT ethmoid osteoma. Mastoid air cells are well aerated. SKULL/SOFT TISSUES: No skull fracture. No significant soft tissue swelling. IMPRESSION: No acute intracranial process. Mild global parenchymal brain volume loss, advanced from prior study. Mild chronic small vessel ischemic disease. Electronically Signed   By: Elon Alas M.D.   On: 09/07/2015 04:48   Dg Chest Port 1 View  09/07/2015  CLINICAL DATA:  LEFT extremity pain, diaphoresis, dizziness and nausea. History of hypertension, diabetes. EXAM: PORTABLE CHEST 1 VIEW COMPARISON:  None. FINDINGS: The cardiac silhouette is mildly enlarged, mediastinal silhouette is nonsuspicious. Mildly calcified aortic knob. Mild pulmonary vascular congestion without pleural effusion or focal consolidation. No pneumothorax. Large body habitus. Osseous structures are nonsuspicious. At least moderate degenerative change of thoracic spine. IMPRESSION: Mild cardiomegaly and pulmonary vascular congestion. Electronically Signed   By: Elon Alas M.D.   On: 09/07/2015 04:45    Review of Systems  Constitutional: Negative for fever and chills.  HENT: Negative for sore throat and tinnitus.   Eyes: Negative for blurred vision and redness.  Respiratory: Negative for cough and shortness of breath.    Cardiovascular: Positive for chest pain. Negative for palpitations, orthopnea and PND.  Gastrointestinal: Positive for nausea. Negative for vomiting,  abdominal pain and diarrhea.  Genitourinary: Negative for dysuria, urgency and frequency.  Musculoskeletal: Negative for myalgias and joint pain.  Skin: Negative for rash.       No lesions  Neurological: Positive for dizziness. Negative for speech change, focal weakness and weakness.  Endo/Heme/Allergies: Does not bruise/bleed easily.       No temperature intolerance  Psychiatric/Behavioral: Negative for depression and suicidal ideas.    Blood pressure 139/64, pulse 64, temperature 98.2 F (36.8 C), temperature source Oral, resp. rate 22, height 5' (1.524 m), weight 113.399 kg (250 lb), SpO2 97 %. Physical Exam  Vitals reviewed. Constitutional: She is oriented to person, place, and time. She appears well-developed and well-nourished. No distress.  HENT:  Head: Normocephalic and atraumatic.  Eyes: Conjunctivae and EOM are normal. Pupils are equal, round, and reactive to light. No scleral icterus.  Neck: Normal range of motion. Neck supple. No JVD present. No tracheal deviation present. No thyromegaly present.  Cardiovascular: Normal rate, regular rhythm and normal heart sounds.  Exam reveals no gallop and no friction rub.   No murmur heard. Respiratory: Effort normal and breath sounds normal. No respiratory distress.  GI: Soft. Bowel sounds are normal. She exhibits no distension. There is no tenderness.  Genitourinary:  Deferred  Musculoskeletal: Normal range of motion. She exhibits no edema.  Lymphadenopathy:    She has no cervical adenopathy.  Neurological: She is alert and oriented to person, place, and time. No cranial nerve deficit. She exhibits normal muscle tone.  Skin: Skin is warm and dry. No rash noted. No erythema.  Psychiatric: She has a normal mood and affect. Her behavior is normal. Judgment and thought content normal.      Assessment/Plan This is a 65 year old female admitted for chest pain. 1. Chest pain: Unstable angina. Continue to follow cardiac enzymes. Monitor telemetry. Cardiology consult placed. 2. Atrial fibrillation: New onset; rate controlled. Continue long-acting metoprolol. Await cardiology recommendations regarding dosing. Start Eliquis 3. Diabetes mellitus type 2: Hold oral hypoglycemics. I place patient on sliding scale insulin while hospitalized. 4. Essential hypertension: Continue amlodipine, lisinopril and minoxidil. 5. DVT prophylaxis: As above 6. GI prophylaxis: None The patient is a full code. Time spent on admission orders and patient care approximately 45 minutes  Harrie Foreman, MD 09/07/2015, 7:08 AM

## 2015-09-07 NOTE — Care Management Obs Status (Signed)
[place] NOTIFICATION   Patient Details  Name: Cindy [name] MRN: [id number] Date of Birth: [birth year]   Medicare Observation Status Notification Given:  Yes (chest pain)    Ival Bible, RN 09/07/2015, 7:28 AM

## 2015-09-07 NOTE — Progress Notes (Signed)
ANTICOAGULATION CONSULT NOTE - Initial Consult  Pharmacy Consult for Apixaban (Eliquis) Indication: atrial fibrillation  Allergies  Allergen Reactions  . Vicodin [Hydrocodone-Acetaminophen] Other (See Comments)    Reaction: racing heart  . Vicoprofen [Hydrocodone-Ibuprofen] Other (See Comments)    Reaction: racing heart    Patient Measurements: Height: 5' (152.4 cm) Weight: 250 lb (113.399 kg) IBW/kg (Calculated) : 45.5  Vital Signs: Temp: 98.2 F (36.8 C) (04/09 0242) Temp Source: Oral (04/09 0242) BP: 139/64 mmHg (04/09 0452) Pulse Rate: 64 (04/09 0452)  Labs:  Recent Labs  09/07/15 0313  HGB 11.2*  HCT 34.5*  PLT 231  CREATININE 0.71  TROPONINI <0.03    Estimated Creatinine Clearance: 80.5 mL/min (by C-G formula based on Cr of 0.71).   Medical History: Past Medical History  Diagnosis Date  . Sleep apnea   . Hypertension   . Depression   . Diabetes mellitus without complication ([place])   . GERD (gastroesophageal reflux disease)   . History of hiatal hernia   . Shortness of breath dyspnea   . Mitral regurgitation   . Chronic pain syndrome   . Obesity   . Iron deficiency anemia   . Microalbuminuric diabetic nephropathy [facility])    Assessment: 65 yo patient with new onset A.Fib. Pharmacy has been consulted for dosing and monitoring of Eliquis.  CrCl: 53ml/min    Scr: 0.71    Age:  65    At: 113kg    Plan:  Will start patient on Apixaban 5mg  BID.  Pharmacy will continue to follow per consult.   Nancy Fetter, PharmD Pharmacy Resident  09/07/2015,8:52 AM

## 2015-09-07 NOTE — Discharge Instructions (Signed)

## 2015-09-07 NOTE — ED Notes (Signed)
Pt arrives to ED from home via [facility] for c/o dizziness, nausea, and left-sided neck pain with radiation into left jaw, left shoulder and left arm. EMS reports pt was orthrostatic at their arrival: sitting BP was 130/70, standing was 98/40. EMS states pt reports pain woke her up this morning, flollowed by nausea. Pt states going to the BR to vomit, but only resulted in her having a BM. EMS reports pts CBG of 262, 98% on RA. Of note, pt had a heart cath placed sometime last yr. Pt arrives A&O, in NAD, respirations even, regular, and unlabored. Pt currently only c/o left arm and right ear pain.

## 2015-09-07 NOTE — ED Provider Notes (Signed)
[facility] Emergency Department Provider Note  ____________________________________________  Time seen: Approximately 4:02 AM  I have reviewed the triage vital signs and the nursing notes.   HISTORY  Chief Complaint Dizziness and Neck Pain    HPI TRINTY ALVILLAR is a 65 y.o. female who presents to the ED from home via EMS with a chief complaint of dizziness, near syncope, nausea, chest pain. Patient reports generalized malaise all week, thought her sinuses or the cause. Tonight awoke with dizziness which she describes as room spinning, followed by nausea. Patient went to the restroom to vomit but had a bowel movement instead. Subsequently she experienced left-sided chest pressure which radiated into her left neck, jaw and left upper arm. Patient reports she had cardiac catheter in May 2016; report reveals:Mid LAD lesion, 30% stenosed. She was treated with medical management at that time. EMS reports patient was orthostatic at their arrival: Sitting BP 130/70, standing BP 98/40. Chest pain associated with diaphoresis, no shortness of breath. Denies recent fever, chills, abdominal pain, diarrhea, dysuria. Nothing makes her symptoms better or worse.   Past Medical History  Diagnosis Date  . Sleep apnea   . Hypertension   . Depression   . Diabetes mellitus without complication ([place])   . GERD (gastroesophageal reflux disease)   . History of hiatal hernia   . Shortness of breath dyspnea   . Mitral regurgitation   . Chronic pain syndrome   . Obesity   . Iron deficiency anemia   . Microalbuminuric diabetic nephropathy ([place])     There are no active problems to display for this patient.   Past Surgical History  Procedure Laterality Date  . Back surgery    . Cardiac catheterization N/A 10/10/2014    Procedure: Left Heart Cath;  Surgeon: Isaias Cowman, MD;  Location: [place] CV LAB;  Service: Cardiovascular;  Laterality: N/A;  . Abdominal  hysterectomy    . Nissen fundoplycation  11/99  . Hernia repair    . Carpal tunnell release  10/23/11  . Cardiac catheterization    . Laparoscopic ovarian cystectomy  08/2001  . Breast biopsy  03/2003  . Colonoscopy with propofol N/A 02/20/2015    Procedure: COLONOSCOPY WITH PROPOFOL;  Surgeon: Hulen Luster, MD;  Location: [facility] ENDOSCOPY;  Service: Gastroenterology;  Laterality: N/A;  . Esophagogastroduodenoscopy (egd) with propofol N/A 02/20/2015    Procedure: ESOPHAGOGASTRODUODENOSCOPY (EGD) WITH PROPOFOL;  Surgeon: Hulen Luster, MD;  Location: [facility] ENDOSCOPY;  Service: Gastroenterology;  Laterality: N/A;    Current Outpatient Rx  Name  Route  Sig  Dispense  Refill  . acetaminophen (TYLENOL) 650 MG CR tablet   Oral   Take 650 mg by mouth every 8 (eight) hours as needed for pain.         Marland Kitchen amLODipine (NORVASC) 10 MG tablet   Oral   Take 10 mg by mouth daily.         Marland Kitchen aspirin EC 81 MG tablet   Oral   Take 81 mg by mouth daily.         Marland Kitchen atorvastatin (LIPITOR) 40 MG tablet   Oral   Take 40 mg by mouth daily.         . cetirizine (ZYRTEC) 10 MG tablet   Oral   Take 10 mg by mouth daily.         . fluticasone (FLONASE) 50 MCG/ACT nasal spray   Each Nare   Place 2 sprays into both nostrils daily.         Marland Kitchen  furosemide (LASIX) 80 MG tablet   Oral   Take 80 mg by mouth 2 (two) times daily.         Marland Kitchen glimepiride (AMARYL) 2 MG tablet   Oral   Take 2-4 mg by mouth 2 (two) times daily. Take 4mg  in the morning and 2mg  in the evening         . lisinopril (PRINIVIL,ZESTRIL) 40 MG tablet   Oral   Take 40 mg by mouth daily.         . meloxicam (MOBIC) 7.5 MG tablet   Oral   Take 7.5 mg by mouth daily.         . metFORMIN (GLUCOPHAGE) 1000 MG tablet   Oral   Take 1,000 mg by mouth 2 (two) times daily with a meal.         . metoprolol succinate (TOPROL-XL) 100 MG 24 hr tablet   Oral   Take 50-100 mg by mouth 2 (two) times daily. Take 1 tablet in the morning and  one-half tablet in the evening. Take with or immediately following a meal.         . minoxidil (LONITEN) 10 MG tablet   Oral   Take 10 mg by mouth daily.         . montelukast (SINGULAIR) 10 MG tablet   Oral   Take 10 mg by mouth daily as needed (allergies).       99   . Multiple Vitamin (MULTIVITAMIN WITH MINERALS) TABS tablet   Oral   Take 1 tablet by mouth daily.         Marland Kitchen omeprazole (PRILOSEC) 20 MG capsule   Oral   Take 20 mg by mouth daily as needed (heartburn).          Marland Kitchen PARoxetine (PAXIL) 40 MG tablet   Oral   Take 40 mg by mouth every morning.         . polyethylene glycol powder (GLYCOLAX/MIRALAX) powder   Oral   Take 17 g by mouth daily.          . potassium chloride SA (K-DUR,KLOR-CON) 20 MEQ tablet   Oral   Take 40 mEq by mouth 2 (two) times daily.         Marland Kitchen tiZANidine (ZANAFLEX) 4 MG capsule   Oral   Take 4 mg by mouth 2 (two) times daily as needed for muscle spasms.          . traMADol (ULTRAM) 50 MG tablet   Oral   Take 50 mg by mouth 3 (three) times daily as needed for moderate pain.            Allergies Vicodin and Vicoprofen  Family History  Problem Relation Age of Onset  . Heart attack Mother   . Hypertension Father   . Stroke Father   . Heart attack Brother     Social History Social History  Substance Use Topics  . Smoking status: Never Smoker   . Smokeless tobacco: Current User    Types: Snuff  . Alcohol Use: No    Review of Systems  Constitutional: No fever/chills. Eyes: No visual changes. ENT: No sore throat. Cardiovascular: Positive for chest pain. Respiratory: Denies shortness of breath. Gastrointestinal: No abdominal pain.  Positive for nausea, no vomiting.  No diarrhea.  No constipation. Genitourinary: Negative for dysuria. Musculoskeletal: Negative for back pain. Skin: Negative for rash. Neurological: Positive for dizziness. Negative for headaches, focal weakness or numbness.  10-point ROS  otherwise negative.  ____________________________________________   PHYSICAL EXAM:  VITAL SIGNS: ED Triage Vitals  Enc Vitals Group     BP 09/07/15 0242 155/61 mmHg     Pulse Rate 09/07/15 0242 70     Resp 09/07/15 0242 16     Temp 09/07/15 0242 98.2 F (36.8 C)     Temp Source 09/07/15 0242 Oral     SpO2 09/07/15 0242 95 %     Weight 09/07/15 0242 250 lb (113.399 kg)     Height 09/07/15 0242 5' (1.524 m)     Head Cir --      Peak Flow --      Pain Score 09/07/15 0245 5     Pain Loc --      Pain Edu? --      Excl. in [place]? --     Constitutional: Alert and oriented. Well appearing and in mild acute distress. Eyes: Conjunctivae are normal. PERRL. EOMI. Head: Atraumatic. Ears: Bilateral TMs within normal limits. Nose: No congestion/rhinnorhea. Mouth/Throat: Mucous membranes are moist.  Oropharynx non-erythematous. Neck: No stridor.  No carotid bruits. Cardiovascular: Normal rate, irregular rhythm. Grossly normal heart sounds.  Good peripheral circulation. Respiratory: Normal respiratory effort.  No retractions. Lungs CTAB. Gastrointestinal: Obese. Soft and nontender. No distention. No abdominal bruits. No CVA tenderness. Musculoskeletal: No lower extremity tenderness nor edema.  No joint effusions. Neurologic:  Normal speech and language. No gross focal neurologic deficits are appreciated.  Skin:  Skin is warm, dry and intact. No rash noted. Psychiatric: Mood and affect are normal. Speech and behavior are normal.  ____________________________________________   LABS (all labs ordered are listed, but only abnormal results are displayed)  Labs Reviewed  CBC WITH DIFFERENTIAL/PLATELET - Abnormal; Notable for the following:    Hemoglobin 11.2 (*)    HCT 34.5 (*)    RDW 15.9 (*)    All other components within normal limits  COMPREHENSIVE METABOLIC PANEL - Abnormal; Notable for the following:    Potassium 3.1 (*)    Glucose, Bld 242 (*)    Anion gap 4 (*)    All other  components within normal limits  TROPONIN I  URINALYSIS COMPLETEWITH MICROSCOPIC ([facility] ONLY)   ____________________________________________  EKG  ED ECG REPORT I, SUNG,JADE J, the attending physician, personally viewed and interpreted this ECG.   Date: 09/07/2015  EKG Time: 0259  Rate: 60  Rhythm: atrial fibrillation, rate 60  Axis: Normal  Intervals:none  ST&T Change: Nonspecific  ____________________________________________  RADIOLOGY  CT head interpreted per Dr. Dorann Lodge: No acute intracranial process.  Mild global parenchymal brain volume loss, advanced from prior study. Mild chronic small vessel ischemic disease.  Portable chest x-ray (viewed by me, interpreted per Dr. Dorann Lodge): Mild cardiomegaly and pulmonary vascular congestion. ____________________________________________   PROCEDURES  Procedure(s) performed: None  Critical Care performed: No  ____________________________________________   INITIAL IMPRESSION / ASSESSMENT AND PLAN / ED COURSE  Pertinent labs & imaging results that were available during my care of the patient were reviewed by me and considered in my medical decision making (see chart for details).  65 year old female who presents with dizziness, near syncopal episode and chest pain. EKG demonstrates atrial fibrillation which is rate controlled. No prior EKGs which demonstrate atrial fibrillation. Patient was orthostatic per EMS. IV fluid resuscitation, will obtain CT head. If negative, will administer aspirin for patient's chest pain.  ----------------------------------------- 5:11 AM on 09/07/2015 -----------------------------------------  Patient denies prior history of atrial fibrillation. She is noted to be in rate-controlled  A. fib on the monitor. Updated patient of negative CT head. Will administer aspirin for chest pain and discuss with hospitalist for evaluation in the emergency department for  admission. ____________________________________________   FINAL CLINICAL IMPRESSION(S) / ED DIAGNOSES  Final diagnoses:  Dizziness  Chest pain, unspecified chest pain type  Near syncope  New onset atrial fibrillation (HCC)      Paulette Blanch, MD 09/07/15 [phone]

## 2015-09-07 NOTE — ED Notes (Signed)
Patient transported to CT 

## 2015-09-08 DIAGNOSIS — I480 Paroxysmal atrial fibrillation: Secondary | ICD-10-CM | POA: Diagnosis not present

## 2015-09-08 DIAGNOSIS — R079 Chest pain, unspecified: Secondary | ICD-10-CM | POA: Diagnosis not present

## 2015-09-08 DIAGNOSIS — I4891 Unspecified atrial fibrillation: Secondary | ICD-10-CM | POA: Diagnosis not present

## 2015-09-08 LAB — GLUCOSE, CAPILLARY
Glucose-Capillary: 219 mg/dL — ABNORMAL HIGH (ref 65–99)
Glucose-Capillary: 284 mg/dL — ABNORMAL HIGH (ref 65–99)

## 2015-09-08 NOTE — Progress Notes (Signed)
Discharge instructions explained to pt/ verbalized an understanding/ iv and tele removed/ rx given to pt/ will transport off unit via wheelchair.

## 2015-09-08 NOTE — Care Management (Addendum)
Patient to be discharged on Eliquis 5 mg BID. Free $30 day trial coupon given. TC to [facility] [place]. Called Eliquis 5 mg po BID #60. Copay will be $45. Patient updated. She sates she has difficulty paying a $45 copay. !-[phone] aptient assistance number given for paient to call and apply for copay assistance.

## 2015-09-08 NOTE — Consult Note (Signed)
[facility]  CARDIOLOGY CONSULT NOTE  Patient ID: PARICIA SHULTIS MRN: [id number] DOB/AGE: 66/21/1952 66 y.o.  Admit date: 09/07/2015 Referring Physician [place] Primary Physician [facility] Primary Cardiologist Teralyn Mullins Reason for Consultation Chest pain  HPI: 65 year old female referred for evaluation chest pain. The patient was admitted on/01/2016 with chest discomfort with radiation to left shoulder and back. The patient was admitted to telemetry where she has ruled out for myocardial infarction by CPK isoenzymes and troponin. She underwent recent cardiac catheterization 10/10/2014 which revealed insignificant coronary artery disease with 30% stenosis mid LAD. The patient was found to be in atrial fibrillation with a controlled ventricular rate. The patient currently is in sinus rhythm. She currently denies recurrent chest pain. The patient was started on Eliquis for stroke prevention.  Review of systems complete and found to be negative unless listed above     Past Medical History  Diagnosis Date  . Sleep apnea   . Hypertension   . Depression   . Diabetes mellitus without complication ([place])   . GERD (gastroesophageal reflux disease)   . History of hiatal hernia   . Shortness of breath dyspnea   . Mitral regurgitation   . Chronic pain syndrome   . Obesity   . Iron deficiency anemia   . Microalbuminuric diabetic nephropathy [facility])     Past Surgical History  Procedure Laterality Date  . Back surgery    . Cardiac catheterization N/A 10/10/2014    Procedure: Left Heart Cath;  Surgeon: Isaias Cowman, MD;  Location: [place] [facility] LAB;  Service: Cardiovascular;  Laterality: N/A;  . Abdominal hysterectomy    . Nissen fundoplycation  11/99  . Hernia repair    . Carpal tunnell release  10/23/11  . Cardiac catheterization    . Laparoscopic ovarian cystectomy  08/2001  . Breast biopsy  03/2003  . Colonoscopy with propofol N/A 02/20/2015    Procedure: COLONOSCOPY WITH PROPOFOL;   Surgeon: Hulen Luster, MD;  Location: [facility] ENDOSCOPY;  Service: Gastroenterology;  Laterality: N/A;  . Esophagogastroduodenoscopy (egd) with propofol N/A 02/20/2015    Procedure: ESOPHAGOGASTRODUODENOSCOPY (EGD) WITH PROPOFOL;  Surgeon: Hulen Luster, MD;  Location: [facility] ENDOSCOPY;  Service: Gastroenterology;  Laterality: N/A;    Prescriptions prior to admission  Medication Sig Dispense Refill Last Dose  . acetaminophen (TYLENOL) 650 MG CR tablet Take 650 mg by mouth every 8 (eight) hours as needed for pain.   prn at prn  . amLODipine (NORVASC) 10 MG tablet Take 10 mg by mouth daily.   09/06/2015 at Unknown time  . aspirin EC 81 MG tablet Take 81 mg by mouth daily.   09/06/2015 at Unknown time  . atorvastatin (LIPITOR) 40 MG tablet Take 40 mg by mouth daily.   09/06/2015 at Unknown time  . cetirizine (ZYRTEC) 10 MG tablet Take 10 mg by mouth daily.   09/06/2015 at Unknown time  . fluticasone (FLONASE) 50 MCG/ACT nasal spray Place 2 sprays into both nostrils daily.   09/06/2015 at Unknown time  . furosemide (LASIX) 80 MG tablet Take 80 mg by mouth 2 (two) times daily.   Past Week at Unknown time  . glimepiride (AMARYL) 2 MG tablet Take 2-4 mg by mouth 2 (two) times daily. Take 4mg  in the morning and 2mg  in the evening   09/06/2015 at Unknown time  . lisinopril (PRINIVIL,ZESTRIL) 40 MG tablet Take 40 mg by mouth daily.   09/06/2015 at Unknown time  . meloxicam (MOBIC) 7.5 MG tablet Take 7.5 mg by  mouth daily.   09/06/2015 at Unknown time  . metFORMIN (GLUCOPHAGE) 1000 MG tablet Take 1,000 mg by mouth 2 (two) times daily with a meal.   09/06/2015 at Unknown time  . metoprolol succinate (TOPROL-XL) 100 MG 24 hr tablet Take 50-100 mg by mouth 2 (two) times daily. Take 1 tablet in the morning and one-half tablet in the evening. Take with or immediately following a meal.   09/06/2015 at 1800  . minoxidil (LONITEN) 10 MG tablet Take 10 mg by mouth daily.   09/06/2015 at Unknown time  . montelukast (SINGULAIR) 10 MG tablet Take 10 mg  by mouth daily as needed (allergies).   99 prn at prn  . Multiple Vitamin (MULTIVITAMIN WITH MINERALS) TABS tablet Take 1 tablet by mouth daily.   09/06/2015 at Unknown time  . omeprazole (PRILOSEC) 20 MG capsule Take 20 mg by mouth daily as needed (heartburn).    prn at prn  . PARoxetine (PAXIL) 40 MG tablet Take 40 mg by mouth every morning.   09/06/2015 at Unknown time  . polyethylene glycol powder (GLYCOLAX/MIRALAX) powder Take 17 g by mouth daily.    09/06/2015 at Unknown time  . potassium chloride SA (K-DUR,KLOR-CON) 20 MEQ tablet Take 40 mEq by mouth 2 (two) times daily.   09/06/2015 at Unknown time  . tiZANidine (ZANAFLEX) 4 MG capsule Take 4 mg by mouth 2 (two) times daily as needed for muscle spasms.    prn at prn  . traMADol (ULTRAM) 50 MG tablet Take 50 mg by mouth 3 (three) times daily as needed for moderate pain.    prn at prn   Social History   Social History  . Marital Status: Married    Spouse Name: N/A  . Number of Children: N/A  . Years of Education: N/A   Occupational History  . Not on file.   Social History Main Topics  . Smoking status: Never Smoker   . Smokeless tobacco: Current User    Types: Snuff  . Alcohol Use: No  . Drug Use: No  . Sexual Activity: Not on file   Other Topics Concern  . Not on file   Social History Narrative    Family History  Problem Relation Age of Onset  . Heart attack Mother   . Hypertension Father   . Stroke Father   . Heart attack Brother       Review of systems complete and found to be negative unless listed above      PHYSICAL EXAM  General: Well developed, well nourished, in no acute distress HEENT:  Normocephalic and atramatic Neck:  No JVD.  Lungs: Clear bilaterally to auscultation and percussion. Heart: HRRR . Normal S1 and S2 without gallops or murmurs.  Abdomen: Bowel sounds are positive, abdomen soft and non-tender  Msk:  Back normal, normal gait. Normal strength and tone for age. Extremities: No clubbing,  cyanosis or edema.   Neuro: Alert and oriented X 3. Psych:  Good affect, responds appropriately  Labs:   Lab Results  Component Value Date   WBC 5.9 09/07/2015   HGB 11.2* 09/07/2015   HCT 34.5* 09/07/2015   MCV 82.8 09/07/2015   PLT 231 09/07/2015    Recent Labs Lab 09/07/15 0313  NA 136  K 3.1*  CL 101  CO2 31  BUN 11  CREATININE 0.71  CALCIUM 9.0  PROT 7.4  BILITOT 0.7  ALKPHOS 84  ALT 16  AST 16  GLUCOSE 242*   Lab Results  Component Value Date   TROPONINI 0.03 09/07/2015   No results found for: CHOL No results found for: HDL No results found for: LDLCALC No results found for: TRIG No results found for: CHOLHDL No results found for: LDLDIRECT    Radiology: Ct Head Wo Contrast  09/07/2015  CLINICAL DATA:  Dizziness, nausea, LEFT-sided pain. History of hypertension, diabetes. EXAM: CT HEAD WITHOUT CONTRAST TECHNIQUE: Contiguous axial images were obtained from the base of the skull through the vertex without intravenous contrast. COMPARISON:  MRI of the head June 25, 2009 FINDINGS: INTRACRANIAL CONTENTS: Mild ventriculomegaly, advanced from prior imaging, on the basis of global parenchymal brain volume loss. No intraparenchymal hemorrhage, mass effect nor midline shift. Patchy supratentorial white matter hypodensities are within normal range for patient's age and though non-specific likely represent chronic small vessel ischemic disease. No acute large vascular territory infarcts. No abnormal extra-axial fluid collections. Basal cisterns are patent. Trace calcific atherosclerosis of the carotid siphons. ORBITS: The included ocular globes and orbital contents are non-suspicious. SINUSES: Mild paranasal sinus mucosal thickening. Subcentimeter LEFT ethmoid osteoma. Mastoid air cells are well aerated. SKULL/SOFT TISSUES: No skull fracture. No significant soft tissue swelling. IMPRESSION: No acute intracranial process. Mild global parenchymal brain volume loss, advanced from  prior study. Mild chronic small vessel ischemic disease. Electronically Signed   By: Elon Alas M.D.   On: 09/07/2015 04:48   Dg Chest Port 1 View  09/07/2015  CLINICAL DATA:  LEFT extremity pain, diaphoresis, dizziness and nausea. History of hypertension, diabetes. EXAM: PORTABLE CHEST 1 VIEW COMPARISON:  None. FINDINGS: The cardiac silhouette is mildly enlarged, mediastinal silhouette is nonsuspicious. Mildly calcified aortic knob. Mild pulmonary vascular congestion without pleural effusion or focal consolidation. No pneumothorax. Large body habitus. Osseous structures are nonsuspicious. At least moderate degenerative change of thoracic spine. IMPRESSION: Mild cardiomegaly and pulmonary vascular congestion. Electronically Signed   By: Elon Alas M.D.   On: 09/07/2015 04:45    EKG: Atrial fibrillation with lateral T-wave inversions  ASSESSMENT AND PLAN:   1. Chest pain, atypical, negative troponin, known in stomach and coronary disease by recent cardiac catheterization 2. Paroxysmal atrial fibrillation, asymptomatic, chads Vasc 4, on Eliquis  Recommendations  1. Agree with overall current therapy 2. Continue Eliquis for stroke prevention 3. Defer further cardiac diagnostics at this time 4. Patient may be discharged later today, follow-up in a week   Signed: Aaron Boeh MD,PhD, [facility] 09/08/2015, 12:06 PM

## 2015-09-08 NOTE — Consult Note (Signed)
   [facility] CM Inpatient Consult   09/08/2015  Cindy [name] August 26, 1950 [id number]   Patient screened for potential [facility] services. Patient was eligible for [place] went to bedside to engage patient and patient had already discharged to home.  For questions please contact:   Kenyada Hy RN, [facility] Liaison  [phone]) Business Mobile [phone]) Toll free office

## 2015-09-10 NOTE — Discharge Summary (Signed)
[facility] NAME: Cindy [name]    MR#:  [id number]  DATE OF BIRTH:  08/10/1950  DATE OF ADMISSION:  09/07/2015 ADMITTING PHYSICIAN: Harrie Foreman, MD  DATE OF DISCHARGE: 09/08/2015  2:15 PM  PRIMARY CARE PHYSICIAN: THIES, DAVID, MD   ADMISSION DIAGNOSIS:  Dizziness [R42] New onset atrial fibrillation ([place]) [I48.91] Near syncope [R55] Chest pain, unspecified chest pain type [R07.9]  DISCHARGE DIAGNOSIS:  Active Problems:   Chest pain   SECONDARY DIAGNOSIS:   Past Medical History  Diagnosis Date  . Sleep apnea   . Hypertension   . Depression   . Diabetes mellitus without complication ([place])   . GERD (gastroesophageal reflux disease)   . History of hiatal hernia   . Shortness of breath dyspnea   . Mitral regurgitation   . Chronic pain syndrome   . Obesity   . Iron deficiency anemia   . Microalbuminuric diabetic nephropathy ([place])      ADMITTING HISTORY  Chief Complaint: Chest pain HPI: The patient presents emergency department complaining of chest pain that began at rest. The patient was sitting in bed when she felt substernal pressure radiating to her back into her left shoulder. The patient states that her left jaw also began to hurt. She felt nauseous at the time but did not vomit. She denies shortness of breath. Notably she has felt episodes of dizziness and lightheadedness associated with nausea and vomiting throughout the day for more than a week. These episodes are unpredictable. Past medical history is significant for coronary artery disease status post heart catheterization in May 2016 which showed a 30% mid LAD lesion, diabetes mellitus type 2 and hypertension. In the emergency department the patient was found to have A. fib with heart rate controlled in the 70s. Initial troponin was negative but the patient continues to feel a pain in her chest similar to "didn't need to burp but being unable to". Due to  her cardiac risk factors as well as continued symptoms the emergency department staff called for admission.  HOSPITAL COURSE:   * Chest pain due to new onset atrial fibrillation Patient was monitored on telemetry floor. Patient was found to be in new onset atrial fibrillation. Because of the cause of her chest pain. Cardiac enzymes are normal  Was seen by Dr. Tomasita Crumble shows of cardiology. Amended continuing Coreg. Patient was started on Eliquis. Discharge home.  Follow-up as outpatient in 1 week.   CONSULTS OBTAINED:  Treatment Team:  Yolonda Kida, MD Isaias Cowman, MD  DRUG ALLERGIES:   Allergies  Allergen Reactions  . Vicodin [Hydrocodone-Acetaminophen] Other (See Comments)    Reaction: racing heart  . Vicoprofen [Hydrocodone-Ibuprofen] Other (See Comments)    Reaction: racing heart    DISCHARGE MEDICATIONS:   Discharge Medication List as of 09/08/2015  1:53 PM    START taking these medications   Details  apixaban (ELIQUIS) 5 MG TABS tablet Take 1 tablet (5 mg total) by mouth 2 (two) times daily., Starting 09/07/2015, Until Discontinued, Print      CONTINUE these medications which have NOT CHANGED   Details  acetaminophen (TYLENOL) 650 MG CR tablet Take 650 mg by mouth every 8 (eight) hours as needed for pain., Until Discontinued, Historical Med    amLODipine (NORVASC) 10 MG tablet Take 10 mg by mouth daily., Until Discontinued, Historical Med    aspirin EC 81 MG tablet Take 81 mg by mouth daily., Until Discontinued, Historical Med  atorvastatin (LIPITOR) 40 MG tablet Take 40 mg by mouth daily., Until Discontinued, Historical Med    cetirizine (ZYRTEC) 10 MG tablet Take 10 mg by mouth daily., Until Discontinued, Historical Med    fluticasone (FLONASE) 50 MCG/ACT nasal spray Place 2 sprays into both nostrils daily., Until Discontinued, Historical Med    furosemide (LASIX) 80 MG tablet Take 80 mg by mouth 2 (two) times daily., Until Discontinued, Historical  Med    glimepiride (AMARYL) 2 MG tablet Take 2-4 mg by mouth 2 (two) times daily. Take 4mg  in the morning and 2mg  in the evening, Until Discontinued, Historical Med    lisinopril (PRINIVIL,ZESTRIL) 40 MG tablet Take 40 mg by mouth daily., Until Discontinued, Historical Med    meloxicam (MOBIC) 7.5 MG tablet Take 7.5 mg by mouth daily., Until Discontinued, Historical Med    metFORMIN (GLUCOPHAGE) 1000 MG tablet Take 1,000 mg by mouth 2 (two) times daily with a meal., Until Discontinued, Historical Med    metoprolol succinate (TOPROL-XL) 100 MG 24 hr tablet Take 50-100 mg by mouth 2 (two) times daily. Take 1 tablet in the morning and one-half tablet in the evening. Take with or immediately following a meal., Until Discontinued, Historical Med    minoxidil (LONITEN) 10 MG tablet Take 10 mg by mouth daily., Until Discontinued, Historical Med    montelukast (SINGULAIR) 10 MG tablet Take 10 mg by mouth daily as needed (allergies). , Starting 06/16/2015, Until Discontinued, Historical Med    Multiple Vitamin (MULTIVITAMIN WITH MINERALS) TABS tablet Take 1 tablet by mouth daily., Until Discontinued, Historical Med    omeprazole (PRILOSEC) 20 MG capsule Take 20 mg by mouth daily as needed (heartburn). , Until Discontinued, Historical Med    PARoxetine (PAXIL) 40 MG tablet Take 40 mg by mouth every morning., Until Discontinued, Historical Med    polyethylene glycol powder (GLYCOLAX/MIRALAX) powder Take 17 g by mouth daily. , Until Discontinued, Historical Med    potassium chloride SA (K-DUR,KLOR-CON) 20 MEQ tablet Take 40 mEq by mouth 2 (two) times daily., Until Discontinued, Historical Med    tiZANidine (ZANAFLEX) 4 MG capsule Take 4 mg by mouth 2 (two) times daily as needed for muscle spasms. , Until Discontinued, Historical Med    traMADol (ULTRAM) 50 MG tablet Take 50 mg by mouth 3 (three) times daily as needed for moderate pain. , Until Discontinued, Historical Med        Today   VITAL  SIGNS:  Blood pressure 153/57, pulse 59, temperature 98 F (36.7 C), temperature source Oral, resp. rate 16, height 5' (1.524 m), weight 113.309 kg (249 lb 12.8 oz), SpO2 94 %.  I/O:  No intake or output data in the 24 hours ending 09/10/15 1425  PHYSICAL EXAMINATION:  Physical Exam  GENERAL:  65 y.o.-year-old patient lying in the bed with no acute distress. Morbidly obese  LUNGS: Normal breath sounds bilaterally, no wheezing, rales,rhonchi or crepitation. No use of accessory muscles of respiration.  CARDIOVASCULAR: S1, S2 normal. No murmurs, rubs, or gallops.  ABDOMEN: Soft, non-tender, non-distended. Bowel sounds present. No organomegaly or mass.  NEUROLOGIC: Moves all 4 extremities. PSYCHIATRIC: The patient is alert and oriented x 3.  SKIN: No obvious rash, lesion, or ulcer.   DATA REVIEW:   CBC  Recent Labs Lab 09/07/15 0313  WBC 5.9  HGB 11.2*  HCT 34.5*  PLT 231    Chemistries   Recent Labs Lab 09/07/15 0313  NA 136  K 3.1*  CL 101  CO2 31  GLUCOSE 242*  BUN 11  CREATININE 0.71  CALCIUM 9.0  AST 16  ALT 16  ALKPHOS 84  BILITOT 0.7    Cardiac Enzymes  Recent Labs Lab 09/07/15 2029  TROPONINI 0.03    Microbiology Results  No results found for this or any previous visit.  RADIOLOGY:  No results found.  Follow up with PCP in 1 week.  Management plans discussed with the patient, family and they are in agreement.  CODE STATUS:  Code Status History    Date Active Date Inactive Code Status Order ID Comments User Context   09/07/2015  8:33 AM 09/08/2015  6:17 PM Full Code [id number]  Harrie Foreman, MD ED   10/10/2014  8:49 AM 10/10/2014  2:13 PM Full Code [id number]  Isaias Cowman, MD Inpatient      TOTAL TIME TAKING CARE OF THIS PATIENT ON DAY OF DISCHARGE: more than 30 minutes.   Hillary Bow R M.D on 09/10/2015 at 2:25 PM  Between 7am to 6pm - Pager - [phone]  After 6pm go to [email or web address] - password [email or web address] [facility]  Office  [phone]  CC: Primary care physician; Ezequiel Kayser, MD  Note: This dictation was prepared with Dragon dictation along with smaller phrase technology. Any transcriptional errors that result from this process are unintentional.

## 2015-10-15 DIAGNOSIS — I10 Essential (primary) hypertension: Secondary | ICD-10-CM | POA: Diagnosis not present

## 2015-10-15 DIAGNOSIS — R600 Localized edema: Secondary | ICD-10-CM | POA: Diagnosis not present

## 2015-10-15 DIAGNOSIS — Z9889 Other specified postprocedural states: Secondary | ICD-10-CM | POA: Diagnosis not present

## 2015-10-15 DIAGNOSIS — E7800 Pure hypercholesterolemia, unspecified: Secondary | ICD-10-CM | POA: Diagnosis not present

## 2015-10-15 DIAGNOSIS — G894 Chronic pain syndrome: Secondary | ICD-10-CM | POA: Diagnosis not present

## 2015-10-15 DIAGNOSIS — R809 Proteinuria, unspecified: Secondary | ICD-10-CM | POA: Diagnosis not present

## 2015-10-15 DIAGNOSIS — I340 Nonrheumatic mitral (valve) insufficiency: Secondary | ICD-10-CM | POA: Diagnosis not present

## 2015-10-15 DIAGNOSIS — G4733 Obstructive sleep apnea (adult) (pediatric): Secondary | ICD-10-CM | POA: Diagnosis not present

## 2015-10-15 DIAGNOSIS — E1129 Type 2 diabetes mellitus with other diabetic kidney complication: Secondary | ICD-10-CM | POA: Diagnosis not present

## 2015-10-15 DIAGNOSIS — R079 Chest pain, unspecified: Secondary | ICD-10-CM | POA: Diagnosis not present

## 2016-02-04 DIAGNOSIS — R079 Chest pain, unspecified: Secondary | ICD-10-CM | POA: Diagnosis not present

## 2016-02-04 DIAGNOSIS — R809 Proteinuria, unspecified: Secondary | ICD-10-CM | POA: Diagnosis not present

## 2016-02-04 DIAGNOSIS — I10 Essential (primary) hypertension: Secondary | ICD-10-CM | POA: Diagnosis not present

## 2016-02-04 DIAGNOSIS — R600 Localized edema: Secondary | ICD-10-CM | POA: Diagnosis not present

## 2016-02-04 DIAGNOSIS — I340 Nonrheumatic mitral (valve) insufficiency: Secondary | ICD-10-CM | POA: Diagnosis not present

## 2016-02-04 DIAGNOSIS — R0609 Other forms of dyspnea: Secondary | ICD-10-CM | POA: Diagnosis not present

## 2016-02-04 DIAGNOSIS — Z9889 Other specified postprocedural states: Secondary | ICD-10-CM | POA: Diagnosis not present

## 2016-02-04 DIAGNOSIS — E1129 Type 2 diabetes mellitus with other diabetic kidney complication: Secondary | ICD-10-CM | POA: Diagnosis not present

## 2016-02-04 DIAGNOSIS — G4733 Obstructive sleep apnea (adult) (pediatric): Secondary | ICD-10-CM | POA: Diagnosis not present

## 2016-02-04 DIAGNOSIS — G894 Chronic pain syndrome: Secondary | ICD-10-CM | POA: Diagnosis not present

## 2016-03-02 DIAGNOSIS — I10 Essential (primary) hypertension: Secondary | ICD-10-CM | POA: Diagnosis not present

## 2016-03-02 DIAGNOSIS — E1129 Type 2 diabetes mellitus with other diabetic kidney complication: Secondary | ICD-10-CM | POA: Diagnosis not present

## 2016-03-02 DIAGNOSIS — J309 Allergic rhinitis, unspecified: Secondary | ICD-10-CM | POA: Diagnosis not present

## 2016-03-02 DIAGNOSIS — D500 Iron deficiency anemia secondary to blood loss (chronic): Secondary | ICD-10-CM | POA: Diagnosis not present

## 2016-03-02 DIAGNOSIS — I480 Paroxysmal atrial fibrillation: Secondary | ICD-10-CM | POA: Diagnosis not present

## 2016-03-02 DIAGNOSIS — R809 Proteinuria, unspecified: Secondary | ICD-10-CM | POA: Diagnosis not present

## 2016-03-02 DIAGNOSIS — E7800 Pure hypercholesterolemia, unspecified: Secondary | ICD-10-CM | POA: Diagnosis not present

## 2016-03-02 DIAGNOSIS — G4733 Obstructive sleep apnea (adult) (pediatric): Secondary | ICD-10-CM | POA: Diagnosis not present

## 2016-03-02 DIAGNOSIS — M5136 Other intervertebral disc degeneration, lumbar region: Secondary | ICD-10-CM | POA: Diagnosis not present

## 2016-03-02 DIAGNOSIS — K219 Gastro-esophageal reflux disease without esophagitis: Secondary | ICD-10-CM | POA: Diagnosis not present

## 2016-03-02 DIAGNOSIS — Z79899 Other long term (current) drug therapy: Secondary | ICD-10-CM | POA: Diagnosis not present

## 2016-03-12 DIAGNOSIS — R0981 Nasal congestion: Secondary | ICD-10-CM | POA: Diagnosis not present

## 2016-03-12 DIAGNOSIS — J301 Allergic rhinitis due to pollen: Secondary | ICD-10-CM | POA: Diagnosis not present

## 2016-03-26 ENCOUNTER — Ambulatory Visit
Admission: RE | Admit: 2016-03-26 | Discharge: 2016-03-26 | Disposition: A | Discharge status: Home / Self Care | Payer: PPO | Source: Ambulatory Visit | Attending: Internal Medicine | Admitting: Internal Medicine

## 2016-03-26 DIAGNOSIS — Z1231 Encounter for screening mammogram for malignant neoplasm of breast: Secondary | ICD-10-CM | POA: Diagnosis not present

## 2016-09-06 DIAGNOSIS — D500 Iron deficiency anemia secondary to blood loss (chronic): Secondary | ICD-10-CM | POA: Diagnosis not present

## 2016-09-06 DIAGNOSIS — G4733 Obstructive sleep apnea (adult) (pediatric): Secondary | ICD-10-CM | POA: Diagnosis not present

## 2016-09-06 DIAGNOSIS — R809 Proteinuria, unspecified: Secondary | ICD-10-CM | POA: Diagnosis not present

## 2016-09-06 DIAGNOSIS — J309 Allergic rhinitis, unspecified: Secondary | ICD-10-CM | POA: Diagnosis not present

## 2016-09-06 DIAGNOSIS — E1129 Type 2 diabetes mellitus with other diabetic kidney complication: Secondary | ICD-10-CM | POA: Diagnosis not present

## 2016-09-06 DIAGNOSIS — E7800 Pure hypercholesterolemia, unspecified: Secondary | ICD-10-CM | POA: Diagnosis not present

## 2016-09-06 DIAGNOSIS — I10 Essential (primary) hypertension: Secondary | ICD-10-CM | POA: Diagnosis not present

## 2016-09-06 DIAGNOSIS — K219 Gastro-esophageal reflux disease without esophagitis: Secondary | ICD-10-CM | POA: Diagnosis not present

## 2016-09-06 DIAGNOSIS — Z79899 Other long term (current) drug therapy: Secondary | ICD-10-CM | POA: Diagnosis not present

## 2016-09-06 DIAGNOSIS — I480 Paroxysmal atrial fibrillation: Secondary | ICD-10-CM | POA: Diagnosis not present

## 2016-09-06 DIAGNOSIS — M5136 Other intervertebral disc degeneration, lumbar region: Secondary | ICD-10-CM | POA: Diagnosis not present

## 2016-11-10 DIAGNOSIS — I340 Nonrheumatic mitral (valve) insufficiency: Secondary | ICD-10-CM | POA: Diagnosis not present

## 2016-11-10 DIAGNOSIS — G4733 Obstructive sleep apnea (adult) (pediatric): Secondary | ICD-10-CM | POA: Diagnosis not present

## 2016-11-10 DIAGNOSIS — Z9889 Other specified postprocedural states: Secondary | ICD-10-CM | POA: Diagnosis not present

## 2016-11-10 DIAGNOSIS — I10 Essential (primary) hypertension: Secondary | ICD-10-CM | POA: Diagnosis not present

## 2016-11-10 DIAGNOSIS — I480 Paroxysmal atrial fibrillation: Secondary | ICD-10-CM | POA: Diagnosis not present

## 2016-11-10 DIAGNOSIS — E7800 Pure hypercholesterolemia, unspecified: Secondary | ICD-10-CM | POA: Diagnosis not present

## 2016-11-10 DIAGNOSIS — R600 Localized edema: Secondary | ICD-10-CM | POA: Diagnosis not present

## 2016-11-10 DIAGNOSIS — R079 Chest pain, unspecified: Secondary | ICD-10-CM | POA: Diagnosis not present

## 2016-11-10 DIAGNOSIS — R0609 Other forms of dyspnea: Secondary | ICD-10-CM | POA: Diagnosis not present

## 2017-02-25 DIAGNOSIS — G4733 Obstructive sleep apnea (adult) (pediatric): Secondary | ICD-10-CM | POA: Diagnosis not present

## 2017-03-02 DIAGNOSIS — K219 Gastro-esophageal reflux disease without esophagitis: Secondary | ICD-10-CM | POA: Diagnosis not present

## 2017-03-02 DIAGNOSIS — I10 Essential (primary) hypertension: Secondary | ICD-10-CM | POA: Diagnosis not present

## 2017-03-02 DIAGNOSIS — D500 Iron deficiency anemia secondary to blood loss (chronic): Secondary | ICD-10-CM | POA: Diagnosis not present

## 2017-03-02 DIAGNOSIS — J309 Allergic rhinitis, unspecified: Secondary | ICD-10-CM | POA: Diagnosis not present

## 2017-03-02 DIAGNOSIS — E7800 Pure hypercholesterolemia, unspecified: Secondary | ICD-10-CM | POA: Diagnosis not present

## 2017-03-02 DIAGNOSIS — E1129 Type 2 diabetes mellitus with other diabetic kidney complication: Secondary | ICD-10-CM | POA: Diagnosis not present

## 2017-03-02 DIAGNOSIS — I480 Paroxysmal atrial fibrillation: Secondary | ICD-10-CM | POA: Diagnosis not present

## 2017-03-02 DIAGNOSIS — M5136 Other intervertebral disc degeneration, lumbar region: Secondary | ICD-10-CM | POA: Diagnosis not present

## 2017-03-02 DIAGNOSIS — G4733 Obstructive sleep apnea (adult) (pediatric): Secondary | ICD-10-CM | POA: Diagnosis not present

## 2017-03-02 DIAGNOSIS — R1310 Dysphagia, unspecified: Secondary | ICD-10-CM | POA: Diagnosis not present

## 2017-03-02 DIAGNOSIS — Z79899 Other long term (current) drug therapy: Secondary | ICD-10-CM | POA: Diagnosis not present

## 2017-03-02 DIAGNOSIS — R809 Proteinuria, unspecified: Secondary | ICD-10-CM | POA: Diagnosis not present

## 2017-03-19 ENCOUNTER — Emergency Department
Admission: EM | Admit: 2017-03-19 | Discharge: 2017-03-19 | Disposition: A | Discharge status: Home / Self Care | Payer: PPO | Attending: Emergency Medicine | Admitting: Emergency Medicine

## 2017-03-19 ENCOUNTER — Encounter: Payer: Self-pay | Admitting: Emergency Medicine

## 2017-03-19 ENCOUNTER — Emergency Department: Payer: PPO

## 2017-03-19 DIAGNOSIS — R002 Palpitations: Secondary | ICD-10-CM | POA: Insufficient documentation

## 2017-03-19 DIAGNOSIS — Z7984 Long term (current) use of oral hypoglycemic drugs: Secondary | ICD-10-CM | POA: Insufficient documentation

## 2017-03-19 DIAGNOSIS — R0602 Shortness of breath: Secondary | ICD-10-CM | POA: Diagnosis not present

## 2017-03-19 DIAGNOSIS — E119 Type 2 diabetes mellitus without complications: Secondary | ICD-10-CM | POA: Diagnosis not present

## 2017-03-19 DIAGNOSIS — Z79899 Other long term (current) drug therapy: Secondary | ICD-10-CM | POA: Diagnosis not present

## 2017-03-19 DIAGNOSIS — Z7982 Long term (current) use of aspirin: Secondary | ICD-10-CM | POA: Insufficient documentation

## 2017-03-19 DIAGNOSIS — I10 Essential (primary) hypertension: Secondary | ICD-10-CM | POA: Insufficient documentation

## 2017-03-19 DIAGNOSIS — F329 Major depressive disorder, single episode, unspecified: Secondary | ICD-10-CM | POA: Diagnosis not present

## 2017-03-19 DIAGNOSIS — Z7901 Long term (current) use of anticoagulants: Secondary | ICD-10-CM | POA: Diagnosis not present

## 2017-03-19 LAB — TROPONIN I: Troponin I: <0.03 ng/mL (ref <0.03)

## 2017-03-19 LAB — CBC
HEMATOCRIT: 32.2 % — AB (ref 35.0–47.0)
HEMOGLOBIN: 10.7 g/dL — AB (ref 12.0–16.0)
MCH: 27.7 pg (ref 26.0–34.0)
MCHC: 33.3 g/dL (ref 32.0–36.0)
MCV: 83.1 fL (ref 80.0–100.0)
Platelets: 241 10*3/uL (ref 150–440)
RBC: 3.88 MIL/uL (ref 3.80–5.20)
RDW: 15.4 % — AB (ref 11.5–14.5)
WBC: 6.9 10*3/uL (ref 3.6–11.0)

## 2017-03-19 LAB — BASIC METABOLIC PANEL
ANION GAP: 11 (ref 5–15)
BUN: 14 mg/dL (ref 6–20)
CHLORIDE: 99 mmol/L — AB (ref 101–111)
CO2: 29 mmol/L (ref 22–32)
Calcium: 8.7 mg/dL — ABNORMAL LOW (ref 8.9–10.3)
Creatinine, Ser: 0.82 mg/dL (ref 0.44–1.00)
GFR calc non Af Amer: >60 mL/min (ref >60)
Glucose, Bld: 174 mg/dL — ABNORMAL HIGH (ref 65–99)
POTASSIUM: 3.1 mmol/L — AB (ref 3.5–5.1)
Sodium: 139 mmol/L (ref 135–145)

## 2017-03-19 LAB — BRAIN NATRIURETIC PEPTIDE: B Natriuretic Peptide: 84 pg/mL (ref 0.0–100.0)

## 2017-03-19 NOTE — ED Triage Notes (Signed)
Pt reports since yesterday feeling like her heart is racing "I hear my heart beat in my ear" reports always short of breath, denies any chest pain pt talks in complete sentences no distress noted

## 2017-03-19 NOTE — ED Provider Notes (Signed)
[facility] Emergency Department Provider Note ____________________________________________   First MD Initiated Contact with Patient 03/19/17 0845     (approximate)  I have reviewed the triage vital signs and the nursing notes.   HISTORY  Chief Complaint Palpitations    HPI Cindy [name] is a 66 y.o. female with past medical history as noted below including atrial fibrillation on a course who presents with palpitations, but described as a pulsating feeling in both of her ears, acute onset approximately 1 AM, and now resolved after several hours, associated with mild frontal headache, but not associated with chest pain, or feeling like her heart is racing in her chest. Patient reports chronic shortness of breath which is unchanged. She denies dizziness or near syncope. She denies any fever or chills or other recent illness.   Past Medical History:  Diagnosis Date  . Chronic pain syndrome   . Depression   . Diabetes mellitus without complication ([place])   . GERD (gastroesophageal reflux disease)   . History of hiatal hernia   . Hypertension   . Iron deficiency anemia   . Microalbuminuric diabetic nephropathy ([place])   . Mitral regurgitation   . Obesity   . Shortness of breath dyspnea   . Sleep apnea     Patient Active Problem List   Diagnosis Date Noted  . Chest pain 09/07/2015    Past Surgical History:  Procedure Laterality Date  . ABDOMINAL HYSTERECTOMY    . BACK SURGERY    . BREAST BIOPSY Right 03/2003  . CARDIAC CATHETERIZATION N/A 10/10/2014   Procedure: Left Heart Cath;  Surgeon: Isaias Cowman, MD;  Location: [facility];  Service: Cardiovascular;  Laterality: N/A;  . CARDIAC CATHETERIZATION    . carpal tunnell release  10/23/11  . COLONOSCOPY WITH PROPOFOL N/A 02/20/2015   Procedure: COLONOSCOPY WITH PROPOFOL;  Surgeon: Hulen Luster, MD;  Location: [facility] ENDOSCOPY;  Service: Gastroenterology;  Laterality: N/A;  .  ESOPHAGOGASTRODUODENOSCOPY (EGD) WITH PROPOFOL N/A 02/20/2015   Procedure: ESOPHAGOGASTRODUODENOSCOPY (EGD) WITH PROPOFOL;  Surgeon: Hulen Luster, MD;  Location: [facility] ENDOSCOPY;  Service: Gastroenterology;  Laterality: N/A;  . HERNIA REPAIR    . LAPAROSCOPIC OVARIAN CYSTECTOMY  08/2001  . nissen fundoplycation  11/99    Prior to Admission medications   Medication Sig Start Date End Date Taking? Authorizing Provider  acetaminophen (TYLENOL) 650 MG CR tablet Take 650 mg by mouth every 8 (eight) hours as needed for pain.    [provider]  amLODipine (NORVASC) 10 MG tablet Take 10 mg by mouth daily.    [provider]  apixaban (ELIQUIS) 5 MG TABS tablet Take 1 tablet (5 mg total) by mouth 2 (two) times daily. 09/07/15   Hillary Bow, MD  aspirin EC 81 MG tablet Take 81 mg by mouth daily.    [provider]  atorvastatin (LIPITOR) 40 MG tablet Take 40 mg by mouth daily.    [provider]  cetirizine (ZYRTEC) 10 MG tablet Take 10 mg by mouth daily.    [provider]  fluticasone (FLONASE) 50 MCG/ACT nasal spray Place 2 sprays into both nostrils daily.    [provider]  furosemide (LASIX) 80 MG tablet Take 80 mg by mouth 2 (two) times daily.    [provider]  glimepiride (AMARYL) 2 MG tablet Take 2-4 mg by mouth 2 (two) times daily. Take 4mg  in the morning and 2mg  in the evening    [provider]  lisinopril (PRINIVIL,ZESTRIL) 40 MG tablet Take 40 mg by mouth daily.    [provider]  meloxicam (MOBIC) 7.5 MG tablet Take 7.5 mg by mouth daily.    [provider]  metFORMIN (GLUCOPHAGE) 1000 MG tablet Take 1,000 mg by mouth 2 (two) times daily with a meal.    [provider]  metoprolol succinate (TOPROL-XL) 100 MG 24 hr tablet Take 50-100 mg by mouth 2 (two) times daily. Take 1 tablet in the morning and one-half tablet in the evening. Take with or immediately following a meal.    [provider]  minoxidil (LONITEN) 10 MG tablet Take 10 mg by mouth daily.    [provider]  montelukast (SINGULAIR) 10 MG tablet Take 10 mg by mouth daily as needed (allergies).  06/16/15   [provider]  Multiple Vitamin (MULTIVITAMIN WITH MINERALS) TABS tablet Take 1 tablet by mouth daily.    [provider]  omeprazole (PRILOSEC) 20 MG capsule Take 20 mg by mouth daily as needed (heartburn).     [provider]  PARoxetine (PAXIL) 40 MG tablet Take 40 mg by mouth every morning.    [provider]  polyethylene glycol powder (GLYCOLAX/MIRALAX) powder Take 17 g by mouth daily.     [provider]  potassium chloride SA (K-DUR,KLOR-CON) 20 MEQ tablet Take 40 mEq by mouth 2 (two) times daily.    [provider]  tiZANidine (ZANAFLEX) 4 MG capsule Take 4 mg by mouth 2 (two) times daily as needed for muscle spasms.     [provider]  traMADol (ULTRAM) 50 MG tablet Take 50 mg by mouth 3 (three) times daily as needed for moderate pain.     [provider]    Allergies Vicodin [hydrocodone-acetaminophen] and Vicoprofen [hydrocodone-ibuprofen]  Family History  Problem Relation Age of Onset  . Heart attack Mother   . Hypertension Father   . Stroke Father   . Heart attack Brother     Social History Social History  Substance Use Topics  . Smoking status: Never Smoker  . Smokeless tobacco: Current User    Types: Snuff  . Alcohol use No    Review of Systems  Constitutional: No fever/chills Eyes: No visual changes. ENT: No sore throat. Positive for "pulsations" in her ears.  Cardiovascular: Denies chest pain. Respiratory: Positive for shortness of breath. Gastrointestinal: No nausea, no vomiting.  No diarrhea.  Genitourinary: Negative for dysuria.  Musculoskeletal: Negative for back pain. Skin: Negative for rash. Neurological: Positive for mild headache.     ____________________________________________   PHYSICAL EXAM:  VITAL SIGNS: ED Triage Vitals  Enc Vitals Group     BP 03/19/17 0809 (!) 163/60     Pulse Rate 03/19/17 0809 75     Resp 03/19/17 0809 20     Temp 03/19/17 0809 98.3 F (36.8 C)     Temp src --      SpO2 03/19/17 0809 96 %     Weight 03/19/17 0812 250 lb (113.4 kg)     Height 03/19/17 0812 5' (1.524 m)     Head Circumference --      Peak Flow --      Pain Score --      Pain Loc --      Pain Edu? --      Excl. in [place]? --     Constitutional: Alert and oriented. Well appearing and in no acute distress. Eyes: Conjunctivae are normal.  Head: Atraumatic.  TMs and ear canals normal.  Nose: No congestion/rhinnorhea. Mouth/Throat: Mucous membranes are moist.   Neck: Normal range of motion. No stridor, no bruits or thrills.  No swelling.   Cardiovascular: Normal rate, regular rhythm. Grossly normal heart sounds.  Good peripheral circulation. Respiratory: Normal respiratory effort.  No retractions. Trace rales bilat. Gastrointestinal: Soft and nontender. No distention.  Genitourinary: No CVA tenderness. Musculoskeletal: No lower extremity edema.  Extremities warm and well perfused.  Neurologic:  Normal speech and language. No gross focal neurologic deficits are appreciated.  Skin:  Skin is warm and dry. No rash noted. Psychiatric: Mood and affect are normal. Speech and behavior are normal.  ____________________________________________   LABS (all labs ordered are listed, but only abnormal results are displayed)  Labs Reviewed  BASIC METABOLIC PANEL - Abnormal; Notable for the following:       Result Value   Potassium 3.1 (*)    Chloride 99 (*)    Glucose, Bld 174 (*)    Calcium 8.7 (*)    All other components within normal limits  CBC - Abnormal; Notable for the following:    Hemoglobin 10.7 (*)    HCT 32.2 (*)    RDW 15.4 (*)    All other components within normal limits  TROPONIN I  TROPONIN I  BRAIN  NATRIURETIC PEPTIDE   ____________________________________________  EKG  ED ECG REPORT I, Arta Silence, the attending physician, personally viewed and interpreted this ECG.  Date: 03/19/2017 EKG Time: 805 Rate: 70 Rhythm: normal sinus rhythm QRS Axis: left axis deviation Intervals: normal ST/T Wave abnormalities: LVH with repolarization abnormality Narrative Interpretation: no evidence of acute ischemia; and compared to EKG of 09/07/2015, patient is now in sinus rhythm and previously inverted T waves in V1 through V3 are now upright  ____________________________________________  RADIOLOGY  CXR: stable mild pulmonary vascular congestion, no other acute findings.  ____________________________________________   PROCEDURES  Procedure(s) performed: No    Critical Care performed: No ____________________________________________   INITIAL IMPRESSION / ASSESSMENT AND PLAN / ED COURSE  Pertinent labs & imaging results that were available during my care of the patient were reviewed by me and considered in my medical decision making (see chart for details).  66 year old female with past medical history as noted above, and also atrial fibrillation was, presents with palpitations but felt primarily as a throbbing in both of her ears, and associated with chronic shortness of breath. Patient states the symptom is now resolved.   On review of past medical records in [place], dishes most recent admission was in April 2017 for new onset of atrial fibrillation. Patient has not had any ablation or other procedure.  On exam, patient is relatively well-appearing, her vital signs are normal, and there are no other significant findings. EKG here is normal sinus and otherwise unremarkable.  Overall suspect most likely benign nonspecific palpitations versus possible episode of atrial fibrillation. Given the patient is currently asymptomatic, and is not currently in atrial fibrillation, no  indication for acute intervention. We'll obtain basic labs, cardiac enzymes, chest x-ray and reassess.    ----------------------------------------- 12:43 PM on 03/19/2017 -----------------------------------------  Patient is an no recurrence of the symptoms in the ED, and she is comfortable. Troponin is negative 2, and the other lab workup is unremarkable. Chest x-ray shows mild pulmonary vascular congestion but no acute changes. Patient feels well to go home. Return precautions given. Patient discharged to follow up with her primary care doctor this week.  ____________________________________________   FINAL CLINICAL  IMPRESSION(S) / ED DIAGNOSES  Final diagnoses:  Palpitations      NEW MEDICATIONS STARTED DURING THIS VISIT:  New Prescriptions   No medications on file     Note:  This document was prepared using Dragon voice recognition software and may include unintentional dictation errors.    Arta Silence, MD 03/19/17 1244

## 2017-03-19 NOTE — Discharge Instructions (Signed)
Return to ER for new, worsening, or recurrent palpitations, worsening difficulty breathing, chest pain, weakness or dizziness, or any other new or worsening symptoms that concern you. Take your regular medications as prescribed. Follow-up with your primary care doctor within the next week.

## 2017-03-19 NOTE — ED Notes (Signed)
ED Provider at bedside. 

## 2017-04-02 IMAGING — MR MR LUMBAR SPINE W/O CM
5 series · 34 of 48 positions shown · non-contrast
Comparison: CT Abdomen and Pelvis 12/17/2013. Lumbar MRI
05/16/2012.

CLINICAL DATA: 63-year-old female with lumbar pain radiating to the
left lower extremity into the calf and knee. Bilateral hip pain for
3 months. Previous lumbar surgery. Subsequent encounter.

EXAM:
MRI LUMBAR SPINE WITHOUT CONTRAST
TECHNIQUE: Multiplanar, multisequence MR imaging of the lumbar spine was
performed. No intravenous contrast was administered.

[Series 2: T2 · sagittal · 4.0mm · 0.81mm/px · 6 of 15 slices shown (1 of 2)]
[im 1/15]
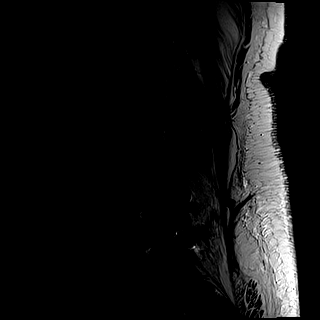
[im 3/15]
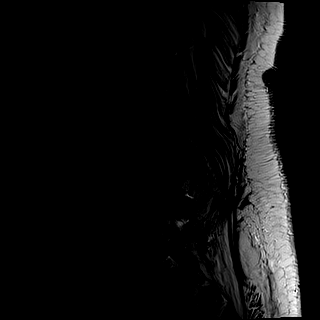
[im 6/15]
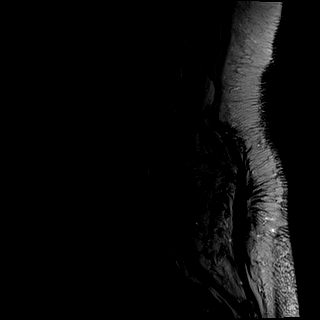
[im 9/15]
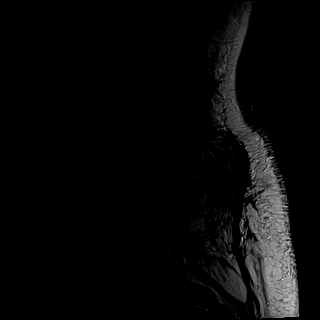
[im 12/15]
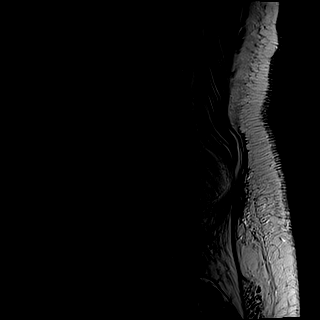
[im 15/15]
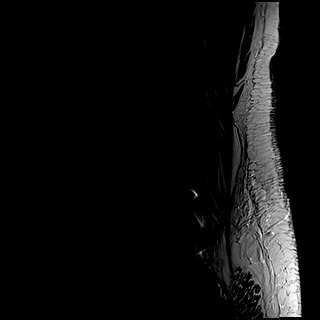

[Series 3: T1 · sagittal · 4.0mm · 0.81mm/px · 6 of 15 slices shown (1 of 2)]
[im 1/15]
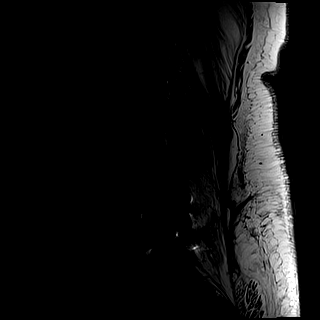
[im 3/15]
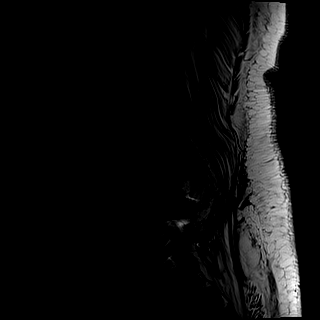
[im 6/15]
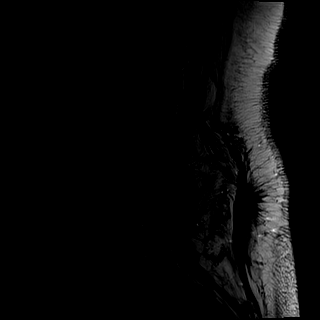
[im 9/15]
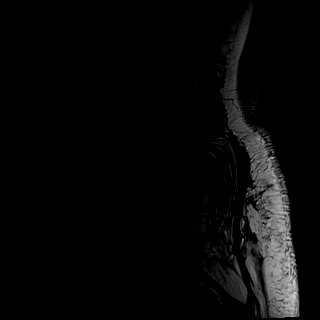
[im 12/15]
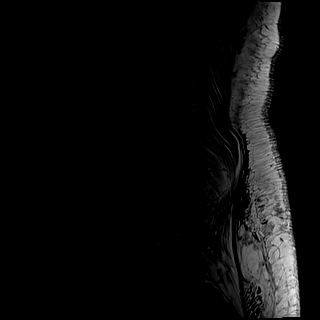
[im 15/15]
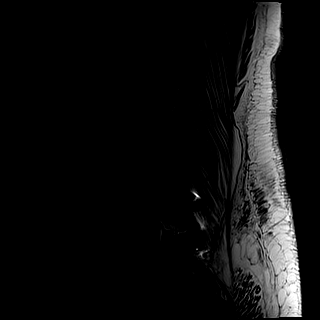

[Series 4: STIR · sagittal · 4.0mm · 1.02mm/px · 4 of 15 slices shown]
[im 1/15]
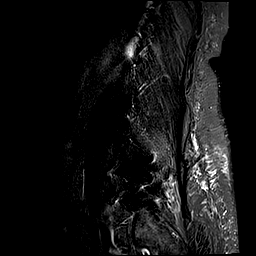
[im 3/15]
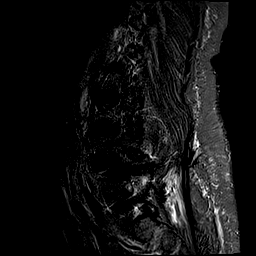
[im 6/15]
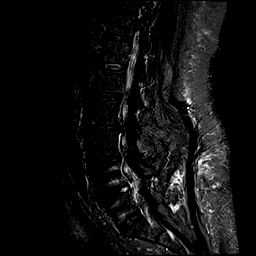
[im 9/15]
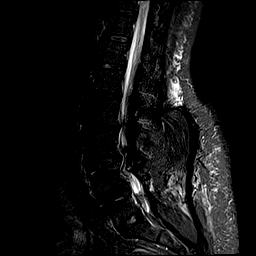

[Series 5: T2 · axial · 4.0mm · 0.78mm/px · z∈[-25,+190]mm · 9 of 38 slices shown (2 of 2)]
[im 1/38]
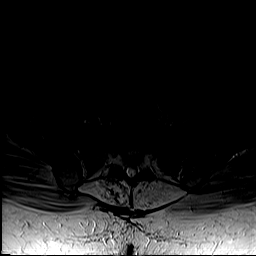
[im 6/38]
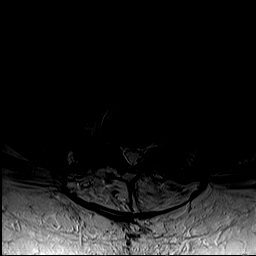
[im 11/38]
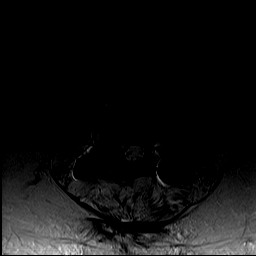
[im 16/38]
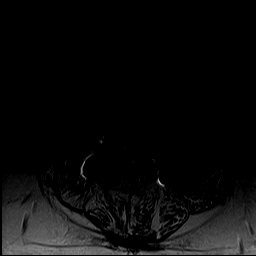
[im 19/38]
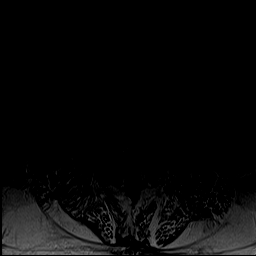
[im 22/38]
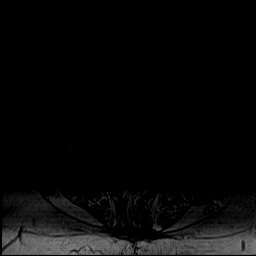
[im 27/38]
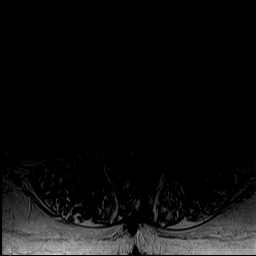
[im 32/38]
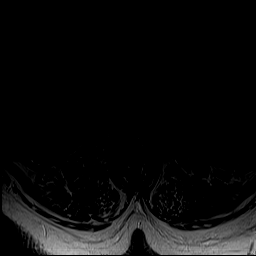
[im 38/38]
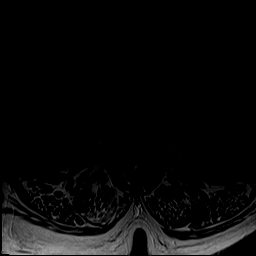

[Series 6: T1 · axial · 4.0mm · 0.39mm/px · z∈[-25,+190]mm · 9 of 38 slices shown (2 of 2)]
[im 1/38]
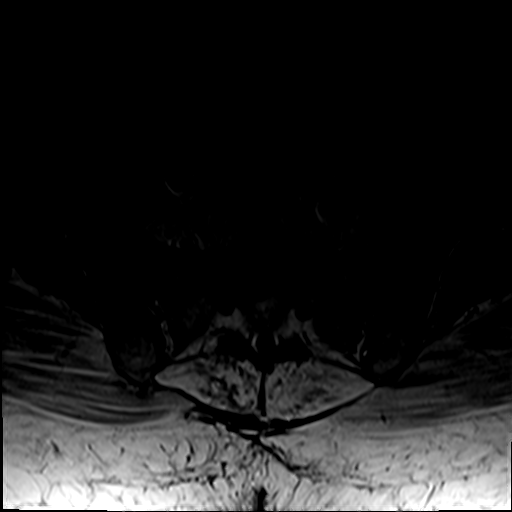
[im 6/38]
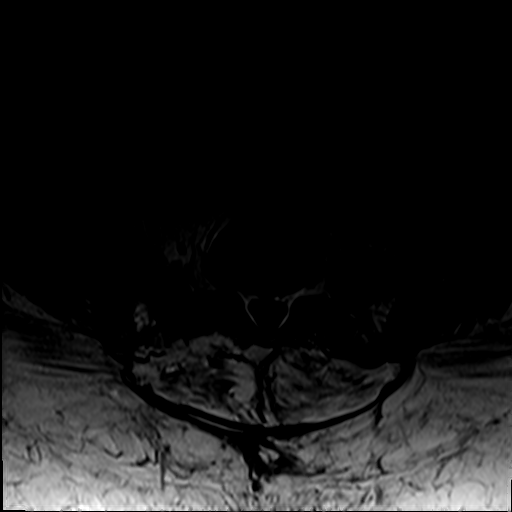
[im 11/38]
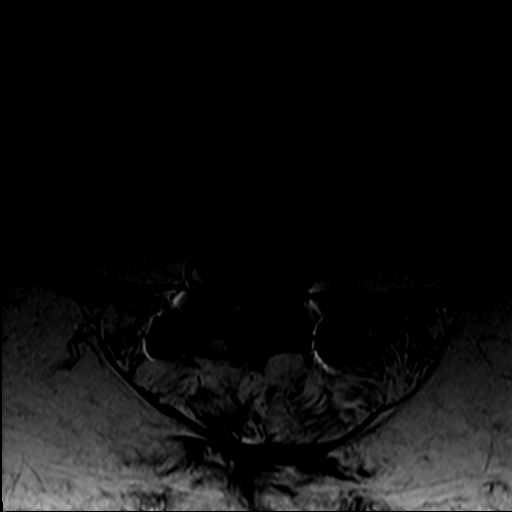
[im 16/38]
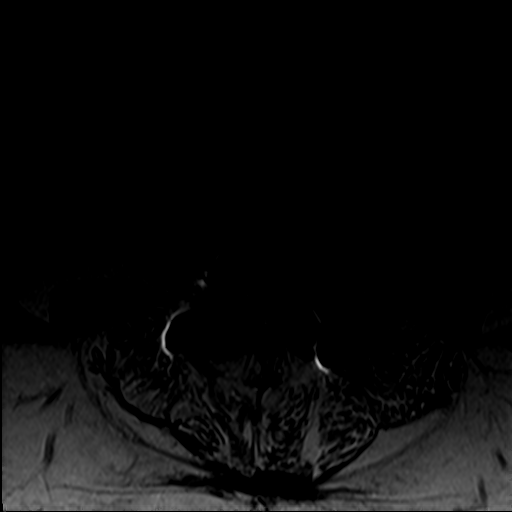
[im 19/38]
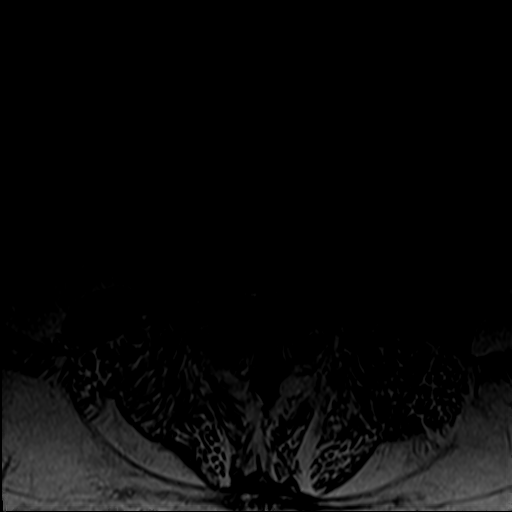
[im 22/38]
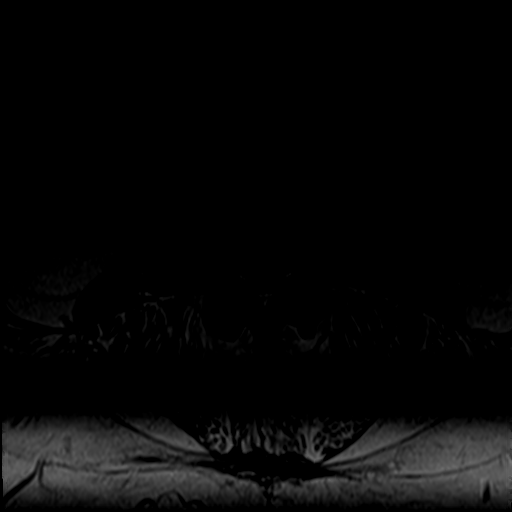
[im 27/38]
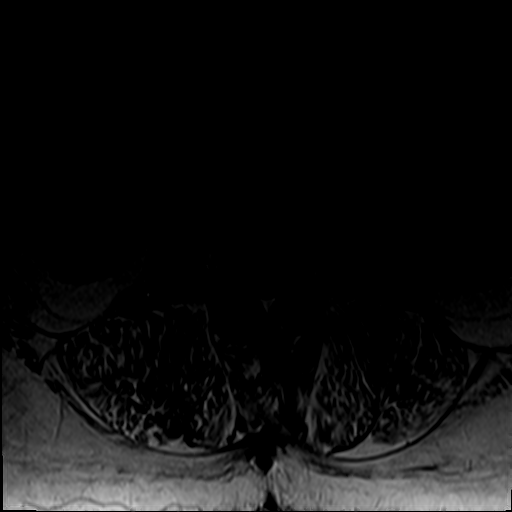
[im 32/38]
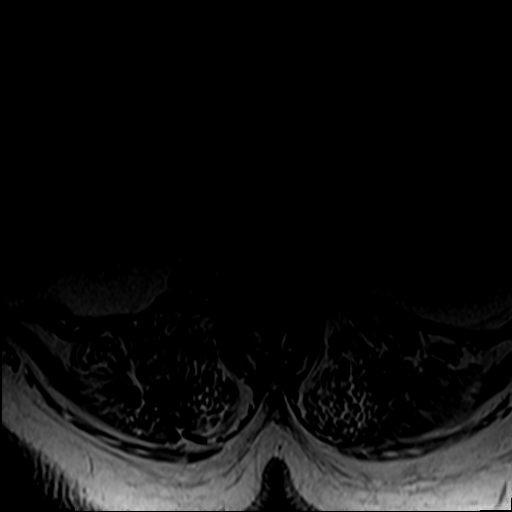
[im 38/38]
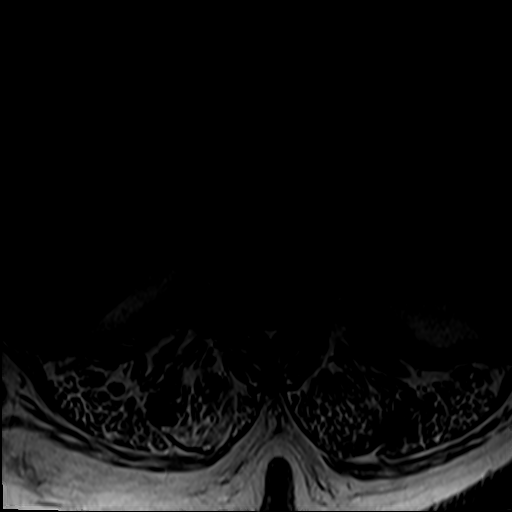

[34 of 48 positions shown; findings below may reference images not displayed]

FINDINGS: Same numbering system as on the comparison with normal lumbar
segmentation and previous L4-L5 posterior and interbody fusion
sequelae. Stable vertebral height and alignment. Mild hardware
susceptibility artifact at L4-L5. No marrow edema or evidence of
acute osseous abnormality.

Visualized lower thoracic spinal cord is normal with conus medularis
at L1-L2. Mild postoperative changes to the posterior paraspinal
soft tissues appear stable. Negative visualized abdominal viscera.

T10-T11:  Mild right facet hypertrophy.

T11-T12:  Negative.

T12-L1:  Stable mild facet hypertrophy greater on the left.

L1-L2: Stable mild circumferential disc bulge. Mild to moderate
facet hypertrophy is stable. Mild epidural lipomatosis. Stable
borderline to mild spinal and L1 foraminal stenosis.

L2-L3: Chronic circumferential disc bulge. Superimposed new right
paracentral small disc extrusion with annular fissure best seen on
series 2, image 8. Moderate facet and ligament flavum hypertrophy
appears stable. Mildly increased epidural lipomatosis.

Combined there is increased mild to moderate spinal stenosis, and
right lateral recess stenosis. Stable mild right L2 foraminal
stenosis.

L3-L4: Stable chronic circumferential disc bulge. Chronic severe
facet and ligament flavum hypertrophy re- identified, and stable
including a small left side subligamentous synovial cyst re-
identified on series 5, image 22. Stable mild left lateral recess
stenosis. Stable borderline to mild spinal stenosis. Stable mild L3
foraminal stenosis.

L4-L5: Sequelae of decompression and fusion appear stable including
residual endplate spurring on the left. Staple thecal sac and
foraminal patency.

L5-S1: Negative disc aside from stable chronic left far lateral disc
osteophyte complex. Stable mild to moderate facet hypertrophy. No
stenosis.
IMPRESSION: 1. Stable postoperative changes at L4-L5 from decompression and
fusion, and no significant adjacent segment disease at L5-S1.
2. Stable adjacent segment disease at L3-L4 including severe facet
degeneration. Stable up to mild spinal, left lateral recess, and
foraminal stenosis at that level.
3. Progressed disc degeneration at L2-L3 with new small right
paracentral herniation. Subsequent increased mild to moderate spinal
and right lateral recess stenosis. Stable mild foraminal stenosis.
4. Stable borderline to mild spinal and foraminal stenosis at L1-L2.

## 2017-04-14 DIAGNOSIS — Z7901 Long term (current) use of anticoagulants: Secondary | ICD-10-CM | POA: Diagnosis not present

## 2017-04-14 DIAGNOSIS — K219 Gastro-esophageal reflux disease without esophagitis: Secondary | ICD-10-CM | POA: Diagnosis not present

## 2017-04-14 DIAGNOSIS — R194 Change in bowel habit: Secondary | ICD-10-CM | POA: Diagnosis not present

## 2017-04-14 DIAGNOSIS — Z9889 Other specified postprocedural states: Secondary | ICD-10-CM | POA: Diagnosis not present

## 2017-04-14 DIAGNOSIS — Z86010 Personal history of colonic polyps: Secondary | ICD-10-CM | POA: Diagnosis not present

## 2017-04-14 DIAGNOSIS — R1314 Dysphagia, pharyngoesophageal phase: Secondary | ICD-10-CM | POA: Diagnosis not present

## 2017-04-14 DIAGNOSIS — G4733 Obstructive sleep apnea (adult) (pediatric): Secondary | ICD-10-CM | POA: Diagnosis not present

## 2017-04-14 DIAGNOSIS — I480 Paroxysmal atrial fibrillation: Secondary | ICD-10-CM | POA: Diagnosis not present

## 2017-04-14 DIAGNOSIS — I340 Nonrheumatic mitral (valve) insufficiency: Secondary | ICD-10-CM | POA: Diagnosis not present

## 2017-04-25 ENCOUNTER — Encounter: Payer: Self-pay | Admitting: *Deleted

## 2017-04-26 ENCOUNTER — Encounter: Payer: Self-pay | Admitting: Certified Registered Nurse Anesthetist

## 2017-04-26 ENCOUNTER — Other Ambulatory Visit: Payer: Self-pay

## 2017-04-26 ENCOUNTER — Ambulatory Visit: Payer: PPO | Admitting: Certified Registered Nurse Anesthetist

## 2017-04-26 ENCOUNTER — Ambulatory Visit
Admission: RE | Admit: 2017-04-26 | Discharge: 2017-04-26 | Disposition: A | Discharge status: Home / Self Care | Payer: PPO | Source: Ambulatory Visit | Attending: Internal Medicine | Admitting: Internal Medicine

## 2017-04-26 ENCOUNTER — Encounter
Admission: RE | Disposition: A | Discharge status: Home / Self Care | Payer: Self-pay | Source: Ambulatory Visit | Attending: Internal Medicine

## 2017-04-26 DIAGNOSIS — Z86010 Personal history of colonic polyps: Secondary | ICD-10-CM | POA: Insufficient documentation

## 2017-04-26 DIAGNOSIS — F329 Major depressive disorder, single episode, unspecified: Secondary | ICD-10-CM | POA: Insufficient documentation

## 2017-04-26 DIAGNOSIS — I10 Essential (primary) hypertension: Secondary | ICD-10-CM | POA: Diagnosis not present

## 2017-04-26 DIAGNOSIS — R1310 Dysphagia, unspecified: Secondary | ICD-10-CM | POA: Diagnosis not present

## 2017-04-26 DIAGNOSIS — E1121 Type 2 diabetes mellitus with diabetic nephropathy: Secondary | ICD-10-CM | POA: Diagnosis not present

## 2017-04-26 DIAGNOSIS — I482 Chronic atrial fibrillation: Secondary | ICD-10-CM | POA: Diagnosis not present

## 2017-04-26 DIAGNOSIS — K224 Dyskinesia of esophagus: Secondary | ICD-10-CM | POA: Insufficient documentation

## 2017-04-26 DIAGNOSIS — K640 First degree hemorrhoids: Secondary | ICD-10-CM | POA: Insufficient documentation

## 2017-04-26 DIAGNOSIS — Z885 Allergy status to narcotic agent status: Secondary | ICD-10-CM | POA: Diagnosis not present

## 2017-04-26 DIAGNOSIS — Z7982 Long term (current) use of aspirin: Secondary | ICD-10-CM | POA: Diagnosis not present

## 2017-04-26 DIAGNOSIS — G4730 Sleep apnea, unspecified: Secondary | ICD-10-CM | POA: Diagnosis not present

## 2017-04-26 DIAGNOSIS — Z9889 Other specified postprocedural states: Secondary | ICD-10-CM | POA: Diagnosis not present

## 2017-04-26 DIAGNOSIS — R1314 Dysphagia, pharyngoesophageal phase: Secondary | ICD-10-CM | POA: Diagnosis not present

## 2017-04-26 DIAGNOSIS — K648 Other hemorrhoids: Secondary | ICD-10-CM | POA: Diagnosis not present

## 2017-04-26 DIAGNOSIS — Z79899 Other long term (current) drug therapy: Secondary | ICD-10-CM | POA: Insufficient documentation

## 2017-04-26 DIAGNOSIS — Z6841 Body Mass Index (BMI) 40.0 and over, adult: Secondary | ICD-10-CM | POA: Insufficient documentation

## 2017-04-26 DIAGNOSIS — I340 Nonrheumatic mitral (valve) insufficiency: Secondary | ICD-10-CM | POA: Insufficient documentation

## 2017-04-26 DIAGNOSIS — G894 Chronic pain syndrome: Secondary | ICD-10-CM | POA: Insufficient documentation

## 2017-04-26 DIAGNOSIS — K219 Gastro-esophageal reflux disease without esophagitis: Secondary | ICD-10-CM | POA: Diagnosis not present

## 2017-04-26 DIAGNOSIS — Z7984 Long term (current) use of oral hypoglycemic drugs: Secondary | ICD-10-CM | POA: Diagnosis not present

## 2017-04-26 DIAGNOSIS — Z1211 Encounter for screening for malignant neoplasm of colon: Secondary | ICD-10-CM | POA: Diagnosis not present

## 2017-04-26 DIAGNOSIS — E119 Type 2 diabetes mellitus without complications: Secondary | ICD-10-CM | POA: Diagnosis not present

## 2017-04-26 HISTORY — PX: ESOPHAGOGASTRODUODENOSCOPY (EGD) WITH PROPOFOL: SHX5813

## 2017-04-26 HISTORY — PX: COLONOSCOPY WITH PROPOFOL: SHX5780

## 2017-04-26 LAB — GLUCOSE, CAPILLARY: Glucose-Capillary: 134 mg/dL — ABNORMAL HIGH (ref 65–99)

## 2017-04-26 SURGERY — COLONOSCOPY WITH PROPOFOL
Anesthesia: General

## 2017-04-26 MED ORDER — PROPOFOL 10 MG/ML IV BOLUS
INTRAVENOUS | Status: AC
  Filled 2017-04-26: qty 20

## 2017-04-26 MED ORDER — PROPOFOL 500 MG/50ML IV EMUL
INTRAVENOUS | Status: DC | PRN
  Administered 2017-04-26: 150 ug/kg/min via INTRAVENOUS

## 2017-04-26 MED ORDER — LIDOCAINE HCL (PF) 2 % IJ SOLN
INTRAMUSCULAR | Status: AC
  Filled 2017-04-26: qty 10

## 2017-04-26 MED ORDER — LIDOCAINE HCL (CARDIAC) 20 MG/ML IV SOLN
INTRAVENOUS | Status: DC | PRN
  Administered 2017-04-26: 60 mg via INTRAVENOUS

## 2017-04-26 MED ORDER — PHENYLEPHRINE HCL 10 MG/ML IJ SOLN
INTRAMUSCULAR | Status: DC | PRN
  Administered 2017-04-26 (×2): 100 ug via INTRAVENOUS

## 2017-04-26 MED ORDER — PROPOFOL 500 MG/50ML IV EMUL
INTRAVENOUS | Status: AC
  Filled 2017-04-26: qty 50

## 2017-04-26 MED ORDER — SODIUM CHLORIDE 0.9 % IV SOLN
INTRAVENOUS | Status: DC
Start: 2017-04-26 — End: 2017-04-26
  Administered 2017-04-26: 14:00:00 via INTRAVENOUS

## 2017-04-26 MED ORDER — PROPOFOL 10 MG/ML IV BOLUS
INTRAVENOUS | Status: DC | PRN
  Administered 2017-04-26: 70 mg via INTRAVENOUS
  Administered 2017-04-26 (×2): 30 mg via INTRAVENOUS

## 2017-04-26 NOTE — Anesthesia Post-op Follow-up Note (Signed)
Anesthesia QCDR form completed.        

## 2017-04-26 NOTE — Anesthesia Procedure Notes (Signed)
Date/Time: 04/26/2017 2:48 PM Performed by: Nelda Marseille, CRNA Pre-anesthesia Checklist: Patient identified, Emergency Drugs available, Suction available, Patient being monitored and Timeout performed Oxygen Delivery Method: Nasal cannula

## 2017-04-26 NOTE — H&P (Signed)
Outpatient short stay form Pre-procedure 04/26/2017 2:33 PM Cindy Kye K. Cindy [name], M.D.  Primary Physician:  Ezequiel Kayser, M.D.  Reason for visit:  Dysphagia, hx of adenomatous colon polyps.   History of present illness:  Patient is a 66 year old female with history of fundoplication of the stomach remotely, with intermittent dysphagia, worse recently. She is undergone previous esophageal dilatation with improvement in dysphagia due to a tight fundoplication. She denies any GERD, nausea or vomiting. The patient has a history of adenomatous colon polyps but this was several years ago and the last colonoscopy 2011 revealed no polyps. The patient has a history chronic atrial fibrillation and usually takes Eliquis but this has been held for 3 days after clearance from her cardiologist.    Current Facility-Administered Medications:  .  0.9 %  sodium chloride infusion, , Intravenous, Continuous, [place], Benay Pike, MD, Last Rate: 20 mL/hr at 04/26/17 1345  Medications Prior to Admission  Medication Sig Dispense Refill Last Dose  . acetaminophen (TYLENOL) 650 MG CR tablet Take 650 mg by mouth every 8 (eight) hours as needed for pain.   Past Week at Unknown time  . amLODipine (NORVASC) 10 MG tablet Take 10 mg by mouth daily.   04/26/2017 at 0600  . apixaban (ELIQUIS) 5 MG TABS tablet Take 1 tablet (5 mg total) by mouth 2 (two) times daily. 60 tablet 0 Past Week at Unknown time  . aspirin EC 81 MG tablet Take 81 mg by mouth daily.   Past Week at Unknown time  . atorvastatin (LIPITOR) 40 MG tablet Take 40 mg by mouth daily.   Past Week at Unknown time  . cetirizine (ZYRTEC) 10 MG tablet Take 10 mg by mouth daily.   Past Week at Unknown time  . DULoxetine (CYMBALTA) 30 MG capsule Take 30 mg by mouth daily.   04/25/2017 at Unknown time  . fluticasone (FLONASE) 50 MCG/ACT nasal spray Place 2 sprays into both nostrils daily.   04/25/2017 at Unknown time  . furosemide (LASIX) 80 MG tablet Take 80 mg by mouth 2  (two) times daily.   04/25/2017 at Unknown time  . glimepiride (AMARYL) 2 MG tablet Take 2-4 mg by mouth 2 (two) times daily. Take 4mg  in the morning and 2mg  in the evening   04/25/2017 at Unknown time  . lisinopril (PRINIVIL,ZESTRIL) 40 MG tablet Take 40 mg by mouth daily.   04/26/2017 at 0600  . metFORMIN (GLUCOPHAGE) 1000 MG tablet Take 1,000 mg by mouth 2 (two) times daily with a meal.   04/25/2017 at Unknown time  . metoprolol succinate (TOPROL-XL) 100 MG 24 hr tablet Take 50-100 mg by mouth 2 (two) times daily. Take 1 tablet in the morning and one-half tablet in the evening. Take with or immediately following a meal.   04/26/2017 at 0600  . minoxidil (LONITEN) 10 MG tablet Take 10 mg by mouth daily.   04/25/2017 at Unknown time  . montelukast (SINGULAIR) 10 MG tablet Take 10 mg by mouth daily as needed (allergies).   99 04/25/2017 at Unknown time  . Multiple Vitamin (MULTIVITAMIN WITH MINERALS) TABS tablet Take 1 tablet by mouth daily.   Past Week at Unknown time  . omeprazole (PRILOSEC) 20 MG capsule Take 20 mg by mouth daily as needed (heartburn).    Past Week at Unknown time  . potassium chloride SA (K-DUR,KLOR-CON) 20 MEQ tablet Take 40 mEq by mouth 2 (two) times daily.   04/25/2017 at Unknown time  . meloxicam (MOBIC) 7.5 MG tablet  Take 7.5 mg by mouth daily.   09/06/2015 at Unknown time  . polyethylene glycol powder (GLYCOLAX/MIRALAX) powder Take 17 g by mouth daily.    09/06/2015 at Unknown time  . tiZANidine (ZANAFLEX) 4 MG capsule Take 4 mg by mouth 2 (two) times daily as needed for muscle spasms.    prn at prn  . traMADol (ULTRAM) 50 MG tablet Take 50 mg by mouth 3 (three) times daily as needed for moderate pain.    prn at prn     Allergies  Allergen Reactions  . Vicodin [Hydrocodone-Acetaminophen] Other (See Comments)    Reaction: racing heart  . Vicoprofen [Hydrocodone-Ibuprofen] Other (See Comments)    Reaction: racing heart     Past Medical History:  Diagnosis Date  .  Chronic pain syndrome   . Depression   . Diabetes mellitus without complication ([place])   . GERD (gastroesophageal reflux disease)   . History of hiatal hernia   . Hypertension   . Iron deficiency anemia   . Microalbuminuric diabetic nephropathy ([place])   . Mitral regurgitation   . Obesity   . Shortness of breath dyspnea   . Sleep apnea     Review of systems:      Physical Exam  General appearance: alert, cooperative and appears older than stated age Resp: clear to auscultation bilaterally Cardio: irregularly irregular rhythm GI: soft, non-tender; bowel sounds normal; no masses,  no organomegaly Extremities: extremities normal, atraumatic, no cyanosis or edema     Planned procedures: Proceed with EGD and colonoscopy. The patient understands the nature of the planned procedure, indications, risks, alternatives and potential complications including but not limited to bleeding, infection, perforation, damage to internal organs and possible oversedation/side effects from anesthesia. The patient agrees and gives consent to proceed.  Please refer to procedure notes for findings, recommendations and patient disposition/instructions.    Clifford Coudriet K. Cindy [name], M.D. Gastroenterology 04/26/2017  2:33 PM

## 2017-04-26 NOTE — Op Note (Signed)
[facility] Gastroenterology Patient Name: Cindy [name] Procedure Date: 04/26/2017 2:35 PM MRN: [id number] Account #: [id number] Date of Birth: 1950/08/13 Admit Type: Outpatient Age: 66 Room: [facility] ENDO ROOM 4 Gender: Female Note Status: Finalized Procedure:            Upper GI endoscopy Indications:          Esophageal dysphagia Providers:            Benay Pike. Alice Reichert MD, MD Referring MD:         Christena Flake. Raechel Ache, MD (Referring MD) Medicines:            Propofol per Anesthesia Complications:        No immediate complications. Procedure:            Pre-Anesthesia Assessment:                       - The risks and benefits of the procedure and the                        sedation options and risks were discussed with the                        patient. All questions were answered and informed                        consent was obtained.                       - Patient identification and proposed procedure were                        verified prior to the procedure by the nurse. The                        procedure was verified in the procedure room.                       - ASA Grade Assessment: III - A patient with severe                        systemic disease.                       - After reviewing the risks and benefits, the patient                        was deemed in satisfactory condition to undergo the                        procedure.                       After obtaining informed consent, the endoscope was                        passed under direct vision. Throughout the procedure,                        the patient's blood pressure, pulse, and oxygen  saturations were monitored continuously. The Endoscope                        was introduced through the mouth, and advanced to the                        third part of duodenum. The upper GI endoscopy was                        accomplished without difficulty. The patient tolerated                      the procedure well. Findings:      A hypertonic lower esophageal sphincter was found. The scope was       withdrawn. Dilation was performed with a Maloney dilator with no       resistance at 71 Fr.      Evidence of a Nissen fundoplication was found in the cardia. The wrap       appeared tight. This was traversed.      The exam was otherwise without abnormality.      The examined duodenum was normal. Impression:           - Hypertonic lower esophageal sphincter. Dilated.                       - A Nissen fundoplication was found. The wrap appears                        tight.                       - The examination was otherwise normal.                       - Normal examined duodenum.                       - No specimens collected. Recommendation:       - See the other procedure note for documentation of                        additional recommendations. Procedure Code(s):    --- Professional ---                       [phone], Esophagogastroduodenoscopy, flexible, transoral;                        diagnostic, including collection of specimen(s) by                        brushing or washing, when performed (separate procedure)                       43450, Dilation of esophagus, by unguided sound or                        bougie, single or multiple passes Diagnosis Code(s):    --- Professional ---                       K22.8, Other specified diseases of esophagus  Z98.890, Other specified postprocedural states                       R13.14, Dysphagia, pharyngoesophageal phase CPT copyright 2016 American Medical Association. All rights reserved. The codes documented in this report are preliminary and upon coder review may  be revised to meet current compliance requirements. Efrain Sella MD, MD 04/26/2017 2:48:13 PM This report has been signed electronically. Number of Addenda: 0 Note Initiated On: 04/26/2017 2:35 PM      [facility]

## 2017-04-26 NOTE — Transfer of Care (Signed)
Immediate Anesthesia Transfer of Care Note  Patient: Cindy [name]  Procedure(s) Performed: COLONOSCOPY WITH PROPOFOL (N/A ) ESOPHAGOGASTRODUODENOSCOPY (EGD) WITH PROPOFOL (N/A )  Patient Location: PACU  Anesthesia Type:General  Level of Consciousness: awake and sedated  Airway & Oxygen Therapy: Patient Spontanous Breathing and Patient connected to nasal cannula oxygen  Post-op Assessment: Report given to RN and Post -op Vital signs reviewed and stable  Post vital signs: Reviewed and stable  Last Vitals:  Vitals:   04/26/17 1259 04/26/17 1510  BP: (!) 120/99 (!) 109/44  Pulse: 68 67  Resp: 16 (!) 27  Temp: 36.5 C (!) 36.3 C  SpO2: 100% 99%    Last Pain:  Vitals:   04/26/17 1510  TempSrc: Tympanic         Complications: No apparent anesthesia complications

## 2017-04-26 NOTE — Op Note (Signed)
[facility] Gastroenterology Patient Name: Cindy [name] Procedure Date: 04/26/2017 2:34 PM MRN: [id number] Account #: [id number] Date of Birth: 10/02/1950 Admit Type: Outpatient Age: 66 Room: [facility] ENDO ROOM 4 Gender: Female Note Status: Finalized Procedure:            Colonoscopy Indications:          High risk colon cancer surveillance: Personal history                        of non-advanced adenoma Providers:            Benay Pike. Alice Reichert MD, MD Referring MD:         Christena Flake. Raechel Ache, MD (Referring MD) Medicines:            Propofol per Anesthesia Complications:        No immediate complications. Procedure:            Pre-Anesthesia Assessment:                       - The risks and benefits of the procedure and the                        sedation options and risks were discussed with the                        patient. All questions were answered and informed                        consent was obtained.                       - Patient identification and proposed procedure were                        verified prior to the procedure by the nurse. The                        procedure was verified in the procedure room.                       - ASA Grade Assessment: III - A patient with severe                        systemic disease.                       After obtaining informed consent, the colonoscope was                        passed under direct vision. Throughout the procedure,                        the patient's blood pressure, pulse, and oxygen                        saturations were monitored continuously. The                        Colonoscope was introduced through the anus and  advanced to the the cecum, identified by appendiceal                        orifice and ileocecal valve. The colonoscopy was                        performed without difficulty. The patient tolerated the                        procedure well. The quality of  the bowel preparation                        was adequate to identify polyps. The ileocecal valve,                        appendiceal orifice, and rectum were photographed. Findings:      The perianal and digital rectal examinations were normal.      The colon (entire examined portion) appeared normal. Biopsies were taken       with a cold forceps for histology.      Non-bleeding internal hemorrhoids were found during retroflexion. The       hemorrhoids were Grade I (internal hemorrhoids that do not prolapse). Impression:           - The entire examined colon is normal. Biopsied.                       - Non-bleeding internal hemorrhoids. Recommendation:       - Patient has a contact number available for                        emergencies. The signs and symptoms of potential                        delayed complications were discussed with the patient.                        Return to normal activities tomorrow. Written discharge                        instructions were provided to the patient.                       - Resume previous diet.                       - Continue present medications.                       - Resume Eliquis (apixaban) at prior dose today.                       - Await pathology results.                       - No repeat colonoscopy due to the absence of colonic                        polyps.                       - Return to GI office  in 3 months.                       - The findings and recommendations were discussed with                        the patient and their family. Procedure Code(s):    --- Professional ---                       [phone], Colonoscopy, flexible; with biopsy, single or                        multiple Diagnosis Code(s):    --- Professional ---                       Z86.010, Personal history of colonic polyps                       K64.0, First degree hemorrhoids CPT copyright 2016 American Medical Association. All rights reserved. The codes  documented in this report are preliminary and upon coder review may  be revised to meet current compliance requirements. Efrain Sella MD, MD 04/26/2017 3:09:23 PM This report has been signed electronically. Number of Addenda: 0 Note Initiated On: 04/26/2017 2:34 PM Scope Withdrawal Time: 0 hours 8 minutes 46 seconds  Total Procedure Duration: 0 hours 15 minutes 22 seconds       [facility]

## 2017-04-26 NOTE — Interval H&P Note (Signed)
History and Physical Interval Note:  04/26/2017 2:35 PM  Cindy [name]  has presented today for surgery, with the diagnosis of HX ADEN POLYPS DYSPHAGIA  The various methods of treatment have been discussed with the patient and family. After consideration of risks, benefits and other options for treatment, the patient has consented to  Procedure(s): COLONOSCOPY WITH PROPOFOL (N/A) ESOPHAGOGASTRODUODENOSCOPY (EGD) WITH PROPOFOL (N/A) as a surgical intervention .  The patient's history has been reviewed, patient examined, no change in status, stable for surgery.  I have reviewed the patient's chart and labs.  Questions were answered to the patient's satisfaction.     [place], [place]

## 2017-04-26 NOTE — Anesthesia Preprocedure Evaluation (Addendum)
Anesthesia Evaluation  Patient identified by MRN, date of birth, ID band Patient awake    Reviewed: Allergy & Precautions, NPO status , Patient's Chart, lab work & pertinent test results  Airway Mallampati: III       Dental  (+) Teeth Intact   Pulmonary shortness of breath and with exertion, sleep apnea ,     + decreased breath sounds      Cardiovascular Exercise Tolerance: Poor hypertension, Pt. on medications and Pt. on home beta blockers  Rhythm:Regular Rate:Normal     Neuro/Psych Depression negative neurological ROS     GI/Hepatic Neg liver ROS, hiatal hernia, GERD  Medicated,  Endo/Other  diabetes, Type 2, Oral Hypoglycemic AgentsMorbid obesity  Renal/GU      Musculoskeletal   Abdominal (+) + obese,   Peds negative pediatric ROS (+)  Hematology  (+) anemia ,   Anesthesia Other Findings   Reproductive/Obstetrics                             Anesthesia Physical Anesthesia Plan  ASA: III  Anesthesia Plan: General   Post-op Pain Management:    Induction: Intravenous  PONV Risk Score and Plan:   Airway Management Planned: Natural Airway and Nasal Cannula  Additional Equipment:   Intra-op Plan:   Post-operative Plan:   Informed Consent: I have reviewed the patients History and Physical, chart, labs and discussed the procedure including the risks, benefits and alternatives for the proposed anesthesia with the patient or authorized representative who has indicated his/her understanding and acceptance.     Plan Discussed with: CRNA  Anesthesia Plan Comments:         Anesthesia Quick Evaluation

## 2017-04-27 ENCOUNTER — Encounter: Payer: Self-pay | Admitting: Internal Medicine

## 2017-04-27 NOTE — Anesthesia Postprocedure Evaluation (Signed)
Anesthesia Post Note  Patient: SHANDELL GIOVANNI  Procedure(s) Performed: COLONOSCOPY WITH PROPOFOL (N/A ) ESOPHAGOGASTRODUODENOSCOPY (EGD) WITH PROPOFOL (N/A )  Patient location during evaluation: PACU Anesthesia Type: General Level of consciousness: awake Pain management: pain level controlled Vital Signs Assessment: post-procedure vital signs reviewed and stable Respiratory status: spontaneous breathing Cardiovascular status: stable Anesthetic complications: no     Last Vitals:  Vitals:   04/26/17 1550 04/26/17 1600  BP: (!) 150/46 (!) 154/50  Pulse: (!) 58 (!) 57  Resp: (!) 29 (!) 24  Temp:    SpO2: 99% 98%    Last Pain:  Vitals:   04/26/17 1510  TempSrc: Tympanic                 VAN STAVEREN,Shanise Balch

## 2017-04-28 LAB — SURGICAL PATHOLOGY

## 2017-05-25 DIAGNOSIS — E1129 Type 2 diabetes mellitus with other diabetic kidney complication: Secondary | ICD-10-CM | POA: Diagnosis not present

## 2017-05-25 DIAGNOSIS — K219 Gastro-esophageal reflux disease without esophagitis: Secondary | ICD-10-CM | POA: Diagnosis not present

## 2017-05-25 DIAGNOSIS — G4733 Obstructive sleep apnea (adult) (pediatric): Secondary | ICD-10-CM | POA: Diagnosis not present

## 2017-05-25 DIAGNOSIS — R809 Proteinuria, unspecified: Secondary | ICD-10-CM | POA: Diagnosis not present

## 2017-05-25 DIAGNOSIS — E7800 Pure hypercholesterolemia, unspecified: Secondary | ICD-10-CM | POA: Diagnosis not present

## 2017-05-25 DIAGNOSIS — Z0000 Encounter for general adult medical examination without abnormal findings: Secondary | ICD-10-CM | POA: Diagnosis not present

## 2017-05-25 DIAGNOSIS — G894 Chronic pain syndrome: Secondary | ICD-10-CM | POA: Diagnosis not present

## 2017-05-25 DIAGNOSIS — I10 Essential (primary) hypertension: Secondary | ICD-10-CM | POA: Diagnosis not present

## 2017-05-25 DIAGNOSIS — M5416 Radiculopathy, lumbar region: Secondary | ICD-10-CM | POA: Diagnosis not present

## 2017-05-25 DIAGNOSIS — Z780 Asymptomatic menopausal state: Secondary | ICD-10-CM | POA: Diagnosis not present

## 2017-05-25 DIAGNOSIS — F3341 Major depressive disorder, recurrent, in partial remission: Secondary | ICD-10-CM | POA: Diagnosis not present

## 2017-05-25 DIAGNOSIS — I480 Paroxysmal atrial fibrillation: Secondary | ICD-10-CM | POA: Diagnosis not present

## 2017-06-02 DIAGNOSIS — I480 Paroxysmal atrial fibrillation: Secondary | ICD-10-CM | POA: Diagnosis not present

## 2017-06-02 DIAGNOSIS — Z9889 Other specified postprocedural states: Secondary | ICD-10-CM | POA: Diagnosis not present

## 2017-06-02 DIAGNOSIS — E1129 Type 2 diabetes mellitus with other diabetic kidney complication: Secondary | ICD-10-CM | POA: Diagnosis not present

## 2017-06-02 DIAGNOSIS — R0609 Other forms of dyspnea: Secondary | ICD-10-CM | POA: Diagnosis not present

## 2017-06-02 DIAGNOSIS — R809 Proteinuria, unspecified: Secondary | ICD-10-CM | POA: Diagnosis not present

## 2017-06-02 DIAGNOSIS — R600 Localized edema: Secondary | ICD-10-CM | POA: Diagnosis not present

## 2017-06-02 DIAGNOSIS — I10 Essential (primary) hypertension: Secondary | ICD-10-CM | POA: Diagnosis not present

## 2017-06-02 DIAGNOSIS — E7800 Pure hypercholesterolemia, unspecified: Secondary | ICD-10-CM | POA: Diagnosis not present

## 2017-06-08 DIAGNOSIS — H40053 Ocular hypertension, bilateral: Secondary | ICD-10-CM | POA: Diagnosis not present

## 2017-06-08 DIAGNOSIS — H52223 Regular astigmatism, bilateral: Secondary | ICD-10-CM | POA: Diagnosis not present

## 2017-06-08 DIAGNOSIS — H04123 Dry eye syndrome of bilateral lacrimal glands: Secondary | ICD-10-CM | POA: Diagnosis not present

## 2017-06-08 DIAGNOSIS — Z961 Presence of intraocular lens: Secondary | ICD-10-CM | POA: Diagnosis not present

## 2017-06-08 DIAGNOSIS — H35033 Hypertensive retinopathy, bilateral: Secondary | ICD-10-CM | POA: Insufficient documentation

## 2017-06-15 DIAGNOSIS — Z780 Asymptomatic menopausal state: Secondary | ICD-10-CM | POA: Diagnosis not present

## 2017-08-31 DIAGNOSIS — E7800 Pure hypercholesterolemia, unspecified: Secondary | ICD-10-CM | POA: Diagnosis not present

## 2017-08-31 DIAGNOSIS — M5136 Other intervertebral disc degeneration, lumbar region: Secondary | ICD-10-CM | POA: Diagnosis not present

## 2017-08-31 DIAGNOSIS — Z79899 Other long term (current) drug therapy: Secondary | ICD-10-CM | POA: Diagnosis not present

## 2017-08-31 DIAGNOSIS — E1129 Type 2 diabetes mellitus with other diabetic kidney complication: Secondary | ICD-10-CM | POA: Diagnosis not present

## 2017-08-31 DIAGNOSIS — G4733 Obstructive sleep apnea (adult) (pediatric): Secondary | ICD-10-CM | POA: Diagnosis not present

## 2017-08-31 DIAGNOSIS — J309 Allergic rhinitis, unspecified: Secondary | ICD-10-CM | POA: Diagnosis not present

## 2017-08-31 DIAGNOSIS — K219 Gastro-esophageal reflux disease without esophagitis: Secondary | ICD-10-CM | POA: Diagnosis not present

## 2017-08-31 DIAGNOSIS — I480 Paroxysmal atrial fibrillation: Secondary | ICD-10-CM | POA: Diagnosis not present

## 2017-08-31 DIAGNOSIS — D500 Iron deficiency anemia secondary to blood loss (chronic): Secondary | ICD-10-CM | POA: Diagnosis not present

## 2017-08-31 DIAGNOSIS — F3341 Major depressive disorder, recurrent, in partial remission: Secondary | ICD-10-CM | POA: Diagnosis not present

## 2017-08-31 DIAGNOSIS — R809 Proteinuria, unspecified: Secondary | ICD-10-CM | POA: Diagnosis not present

## 2017-08-31 DIAGNOSIS — I10 Essential (primary) hypertension: Secondary | ICD-10-CM | POA: Diagnosis not present

## 2017-10-05 DIAGNOSIS — I10 Essential (primary) hypertension: Secondary | ICD-10-CM | POA: Diagnosis not present

## 2017-10-05 DIAGNOSIS — R600 Localized edema: Secondary | ICD-10-CM | POA: Diagnosis not present

## 2017-10-05 DIAGNOSIS — R0609 Other forms of dyspnea: Secondary | ICD-10-CM | POA: Diagnosis not present

## 2017-10-05 DIAGNOSIS — E7800 Pure hypercholesterolemia, unspecified: Secondary | ICD-10-CM | POA: Diagnosis not present

## 2017-10-05 DIAGNOSIS — I340 Nonrheumatic mitral (valve) insufficiency: Secondary | ICD-10-CM | POA: Diagnosis not present

## 2017-10-05 DIAGNOSIS — E1129 Type 2 diabetes mellitus with other diabetic kidney complication: Secondary | ICD-10-CM | POA: Diagnosis not present

## 2017-10-05 DIAGNOSIS — R809 Proteinuria, unspecified: Secondary | ICD-10-CM | POA: Diagnosis not present

## 2017-10-05 DIAGNOSIS — H35033 Hypertensive retinopathy, bilateral: Secondary | ICD-10-CM | POA: Diagnosis not present

## 2017-10-05 DIAGNOSIS — I480 Paroxysmal atrial fibrillation: Secondary | ICD-10-CM | POA: Diagnosis not present

## 2017-10-05 DIAGNOSIS — Z9889 Other specified postprocedural states: Secondary | ICD-10-CM | POA: Diagnosis not present

## 2017-10-05 DIAGNOSIS — G4733 Obstructive sleep apnea (adult) (pediatric): Secondary | ICD-10-CM | POA: Diagnosis not present

## 2017-11-11 DIAGNOSIS — M5136 Other intervertebral disc degeneration, lumbar region: Secondary | ICD-10-CM | POA: Diagnosis not present

## 2017-11-11 DIAGNOSIS — M5416 Radiculopathy, lumbar region: Secondary | ICD-10-CM | POA: Diagnosis not present

## 2017-11-18 ENCOUNTER — Other Ambulatory Visit: Payer: Self-pay | Admitting: Physical Medicine and Rehabilitation

## 2017-11-18 DIAGNOSIS — M5416 Radiculopathy, lumbar region: Secondary | ICD-10-CM | POA: Diagnosis not present

## 2017-11-18 DIAGNOSIS — M5136 Other intervertebral disc degeneration, lumbar region: Secondary | ICD-10-CM | POA: Diagnosis not present

## 2017-11-18 DIAGNOSIS — M47816 Spondylosis without myelopathy or radiculopathy, lumbar region: Secondary | ICD-10-CM | POA: Diagnosis not present

## 2017-12-03 ENCOUNTER — Ambulatory Visit
Admission: RE | Admit: 2017-12-03 | Discharge: 2017-12-03 | Disposition: A | Discharge status: Home / Self Care | Payer: PPO | Source: Ambulatory Visit | Attending: Physical Medicine and Rehabilitation | Admitting: Physical Medicine and Rehabilitation

## 2017-12-03 DIAGNOSIS — M5126 Other intervertebral disc displacement, lumbar region: Secondary | ICD-10-CM | POA: Insufficient documentation

## 2017-12-03 DIAGNOSIS — M48061 Spinal stenosis, lumbar region without neurogenic claudication: Secondary | ICD-10-CM | POA: Insufficient documentation

## 2017-12-03 DIAGNOSIS — Z9889 Other specified postprocedural states: Secondary | ICD-10-CM | POA: Diagnosis not present

## 2017-12-03 DIAGNOSIS — M5416 Radiculopathy, lumbar region: Secondary | ICD-10-CM | POA: Diagnosis not present

## 2017-12-14 DIAGNOSIS — M5416 Radiculopathy, lumbar region: Secondary | ICD-10-CM | POA: Diagnosis not present

## 2017-12-14 DIAGNOSIS — M5126 Other intervertebral disc displacement, lumbar region: Secondary | ICD-10-CM | POA: Diagnosis not present

## 2018-01-11 DIAGNOSIS — M7062 Trochanteric bursitis, left hip: Secondary | ICD-10-CM | POA: Diagnosis not present

## 2018-01-11 DIAGNOSIS — M7061 Trochanteric bursitis, right hip: Secondary | ICD-10-CM | POA: Diagnosis not present

## 2018-01-11 DIAGNOSIS — M5136 Other intervertebral disc degeneration, lumbar region: Secondary | ICD-10-CM | POA: Diagnosis not present

## 2018-01-11 DIAGNOSIS — M5416 Radiculopathy, lumbar region: Secondary | ICD-10-CM | POA: Diagnosis not present

## 2018-02-08 DIAGNOSIS — E1129 Type 2 diabetes mellitus with other diabetic kidney complication: Secondary | ICD-10-CM | POA: Diagnosis not present

## 2018-02-08 DIAGNOSIS — I10 Essential (primary) hypertension: Secondary | ICD-10-CM | POA: Diagnosis not present

## 2018-02-08 DIAGNOSIS — E7800 Pure hypercholesterolemia, unspecified: Secondary | ICD-10-CM | POA: Diagnosis not present

## 2018-02-08 DIAGNOSIS — R600 Localized edema: Secondary | ICD-10-CM | POA: Diagnosis not present

## 2018-02-08 DIAGNOSIS — I480 Paroxysmal atrial fibrillation: Secondary | ICD-10-CM | POA: Diagnosis not present

## 2018-02-08 DIAGNOSIS — I340 Nonrheumatic mitral (valve) insufficiency: Secondary | ICD-10-CM | POA: Diagnosis not present

## 2018-02-08 DIAGNOSIS — R809 Proteinuria, unspecified: Secondary | ICD-10-CM | POA: Diagnosis not present

## 2018-02-08 DIAGNOSIS — R0609 Other forms of dyspnea: Secondary | ICD-10-CM | POA: Diagnosis not present

## 2018-02-15 DIAGNOSIS — M5416 Radiculopathy, lumbar region: Secondary | ICD-10-CM | POA: Diagnosis not present

## 2018-02-15 DIAGNOSIS — M5136 Other intervertebral disc degeneration, lumbar region: Secondary | ICD-10-CM | POA: Diagnosis not present

## 2018-02-22 DIAGNOSIS — I340 Nonrheumatic mitral (valve) insufficiency: Secondary | ICD-10-CM | POA: Diagnosis not present

## 2018-02-22 DIAGNOSIS — I480 Paroxysmal atrial fibrillation: Secondary | ICD-10-CM | POA: Diagnosis not present

## 2018-02-22 DIAGNOSIS — R600 Localized edema: Secondary | ICD-10-CM | POA: Diagnosis not present

## 2018-02-22 DIAGNOSIS — I10 Essential (primary) hypertension: Secondary | ICD-10-CM | POA: Diagnosis not present

## 2018-02-22 DIAGNOSIS — R0609 Other forms of dyspnea: Secondary | ICD-10-CM | POA: Diagnosis not present

## 2018-03-03 DIAGNOSIS — G4733 Obstructive sleep apnea (adult) (pediatric): Secondary | ICD-10-CM | POA: Diagnosis not present

## 2018-03-03 DIAGNOSIS — Z1239 Encounter for other screening for malignant neoplasm of breast: Secondary | ICD-10-CM | POA: Diagnosis not present

## 2018-03-03 DIAGNOSIS — Z79899 Other long term (current) drug therapy: Secondary | ICD-10-CM | POA: Diagnosis not present

## 2018-03-03 DIAGNOSIS — E1129 Type 2 diabetes mellitus with other diabetic kidney complication: Secondary | ICD-10-CM | POA: Diagnosis not present

## 2018-03-03 DIAGNOSIS — I10 Essential (primary) hypertension: Secondary | ICD-10-CM | POA: Diagnosis not present

## 2018-03-03 DIAGNOSIS — J309 Allergic rhinitis, unspecified: Secondary | ICD-10-CM | POA: Diagnosis not present

## 2018-03-03 DIAGNOSIS — D509 Iron deficiency anemia, unspecified: Secondary | ICD-10-CM | POA: Diagnosis not present

## 2018-03-03 DIAGNOSIS — E7800 Pure hypercholesterolemia, unspecified: Secondary | ICD-10-CM | POA: Diagnosis not present

## 2018-03-03 DIAGNOSIS — R809 Proteinuria, unspecified: Secondary | ICD-10-CM | POA: Diagnosis not present

## 2018-03-03 DIAGNOSIS — I480 Paroxysmal atrial fibrillation: Secondary | ICD-10-CM | POA: Diagnosis not present

## 2018-03-03 DIAGNOSIS — Z1231 Encounter for screening mammogram for malignant neoplasm of breast: Secondary | ICD-10-CM | POA: Diagnosis not present

## 2018-03-03 DIAGNOSIS — K219 Gastro-esophageal reflux disease without esophagitis: Secondary | ICD-10-CM | POA: Diagnosis not present

## 2018-03-03 DIAGNOSIS — M5416 Radiculopathy, lumbar region: Secondary | ICD-10-CM | POA: Diagnosis not present

## 2018-03-06 ENCOUNTER — Other Ambulatory Visit: Payer: Self-pay | Admitting: Internal Medicine

## 2018-03-06 DIAGNOSIS — Z1231 Encounter for screening mammogram for malignant neoplasm of breast: Secondary | ICD-10-CM

## 2018-03-15 DIAGNOSIS — M5416 Radiculopathy, lumbar region: Secondary | ICD-10-CM | POA: Diagnosis not present

## 2018-03-15 DIAGNOSIS — M5136 Other intervertebral disc degeneration, lumbar region: Secondary | ICD-10-CM | POA: Diagnosis not present

## 2018-03-22 ENCOUNTER — Ambulatory Visit
Admission: RE | Admit: 2018-03-22 | Discharge: 2018-03-22 | Disposition: A | Discharge status: Home / Self Care | Payer: PPO | Source: Ambulatory Visit | Attending: Internal Medicine | Admitting: Internal Medicine

## 2018-03-22 DIAGNOSIS — Z1231 Encounter for screening mammogram for malignant neoplasm of breast: Secondary | ICD-10-CM | POA: Diagnosis not present

## 2018-03-24 DIAGNOSIS — T502X5A Adverse effect of carbonic-anhydrase inhibitors, benzothiadiazides and other diuretics, initial encounter: Secondary | ICD-10-CM | POA: Insufficient documentation

## 2018-03-24 DIAGNOSIS — E876 Hypokalemia: Secondary | ICD-10-CM | POA: Insufficient documentation

## 2018-04-03 DIAGNOSIS — E1122 Type 2 diabetes mellitus with diabetic chronic kidney disease: Secondary | ICD-10-CM | POA: Diagnosis not present

## 2018-04-03 DIAGNOSIS — R809 Proteinuria, unspecified: Secondary | ICD-10-CM | POA: Diagnosis not present

## 2018-04-03 DIAGNOSIS — N183 Chronic kidney disease, stage 3 (moderate): Secondary | ICD-10-CM | POA: Diagnosis not present

## 2018-04-03 DIAGNOSIS — I10 Essential (primary) hypertension: Secondary | ICD-10-CM | POA: Diagnosis not present

## 2018-04-03 DIAGNOSIS — N182 Chronic kidney disease, stage 2 (mild): Secondary | ICD-10-CM | POA: Diagnosis not present

## 2018-04-05 DIAGNOSIS — M5136 Other intervertebral disc degeneration, lumbar region: Secondary | ICD-10-CM | POA: Diagnosis not present

## 2018-04-05 DIAGNOSIS — M6281 Muscle weakness (generalized): Secondary | ICD-10-CM | POA: Diagnosis not present

## 2018-04-05 DIAGNOSIS — M2560 Stiffness of unspecified joint, not elsewhere classified: Secondary | ICD-10-CM | POA: Diagnosis not present

## 2018-04-05 DIAGNOSIS — M5416 Radiculopathy, lumbar region: Secondary | ICD-10-CM | POA: Diagnosis not present

## 2018-04-12 ENCOUNTER — Other Ambulatory Visit: Payer: Self-pay | Admitting: Nephrology

## 2018-04-12 DIAGNOSIS — M5416 Radiculopathy, lumbar region: Secondary | ICD-10-CM | POA: Diagnosis not present

## 2018-04-12 DIAGNOSIS — M2560 Stiffness of unspecified joint, not elsewhere classified: Secondary | ICD-10-CM | POA: Diagnosis not present

## 2018-04-12 DIAGNOSIS — N183 Chronic kidney disease, stage 3 unspecified: Secondary | ICD-10-CM

## 2018-04-12 DIAGNOSIS — M5136 Other intervertebral disc degeneration, lumbar region: Secondary | ICD-10-CM | POA: Diagnosis not present

## 2018-04-12 DIAGNOSIS — M6281 Muscle weakness (generalized): Secondary | ICD-10-CM | POA: Diagnosis not present

## 2018-04-14 DIAGNOSIS — M5136 Other intervertebral disc degeneration, lumbar region: Secondary | ICD-10-CM | POA: Diagnosis not present

## 2018-04-14 DIAGNOSIS — M6281 Muscle weakness (generalized): Secondary | ICD-10-CM | POA: Diagnosis not present

## 2018-04-14 DIAGNOSIS — M2560 Stiffness of unspecified joint, not elsewhere classified: Secondary | ICD-10-CM | POA: Diagnosis not present

## 2018-04-14 DIAGNOSIS — M5416 Radiculopathy, lumbar region: Secondary | ICD-10-CM | POA: Diagnosis not present

## 2018-04-18 ENCOUNTER — Emergency Department
Admission: EM | Admit: 2018-04-18 | Discharge: 2018-04-18 | Disposition: A | Discharge status: Home / Self Care | Payer: PPO | Attending: Emergency Medicine | Admitting: Emergency Medicine

## 2018-04-18 ENCOUNTER — Encounter: Payer: Self-pay | Admitting: Emergency Medicine

## 2018-04-18 ENCOUNTER — Emergency Department: Payer: PPO

## 2018-04-18 ENCOUNTER — Other Ambulatory Visit: Payer: Self-pay

## 2018-04-18 DIAGNOSIS — E119 Type 2 diabetes mellitus without complications: Secondary | ICD-10-CM | POA: Diagnosis not present

## 2018-04-18 DIAGNOSIS — I10 Essential (primary) hypertension: Secondary | ICD-10-CM | POA: Diagnosis not present

## 2018-04-18 DIAGNOSIS — M160 Bilateral primary osteoarthritis of hip: Secondary | ICD-10-CM | POA: Diagnosis not present

## 2018-04-18 DIAGNOSIS — Z79899 Other long term (current) drug therapy: Secondary | ICD-10-CM | POA: Diagnosis not present

## 2018-04-18 DIAGNOSIS — M1712 Unilateral primary osteoarthritis, left knee: Secondary | ICD-10-CM | POA: Diagnosis not present

## 2018-04-18 DIAGNOSIS — Z7984 Long term (current) use of oral hypoglycemic drugs: Secondary | ICD-10-CM | POA: Insufficient documentation

## 2018-04-18 DIAGNOSIS — M25552 Pain in left hip: Secondary | ICD-10-CM | POA: Diagnosis not present

## 2018-04-18 DIAGNOSIS — Z7901 Long term (current) use of anticoagulants: Secondary | ICD-10-CM | POA: Insufficient documentation

## 2018-04-18 DIAGNOSIS — Z7982 Long term (current) use of aspirin: Secondary | ICD-10-CM | POA: Insufficient documentation

## 2018-04-18 DIAGNOSIS — M1711 Unilateral primary osteoarthritis, right knee: Secondary | ICD-10-CM | POA: Diagnosis not present

## 2018-04-18 MED ORDER — TRAMADOL HCL 50 MG PO TABS
50.0000 mg | ORAL_TABLET | Freq: Once | ORAL | Status: AC
  Administered 2018-04-18: 50 mg via ORAL
  Filled 2018-04-18: qty 1

## 2018-04-18 MED ORDER — LIDOCAINE 5 % EX PTCH
1.0000 | MEDICATED_PATCH | CUTANEOUS | Status: DC
  Administered 2018-04-18: 1 via TRANSDERMAL
  Filled 2018-04-18: qty 1

## 2018-04-18 MED ORDER — MELOXICAM 7.5 MG PO TABS: 7.5000 mg | ORAL_TABLET | Freq: Every day | ORAL | 2 refills | Status: DC

## 2018-04-18 MED ORDER — TRAMADOL HCL 50 MG PO TABS: 50.0000 mg | ORAL_TABLET | Freq: Two times a day (BID) | ORAL | 0 refills | Status: DC | PRN

## 2018-04-18 MED ORDER — KETOROLAC TROMETHAMINE 60 MG/2ML IM SOLN
30.0000 mg | Freq: Once | INTRAMUSCULAR | Status: AC
  Administered 2018-04-18: 30 mg via INTRAMUSCULAR
  Filled 2018-04-18: qty 2

## 2018-04-18 NOTE — ED Notes (Signed)
See triage note presents with a 6 month hx of back pain and left hip pain  States she has had injections with min relief  States pain moves from right to left hip  Also moving into left leg  States pain is worse with walking

## 2018-04-18 NOTE — Discharge Instructions (Addendum)
Take medication as directed follow orthopedic for definitive evaluation and treatment.  Call and tell them you are a follow-up from the emergency room.

## 2018-04-18 NOTE — ED Provider Notes (Signed)
[facility] Emergency Department Provider Note   ____________________________________________   First MD Initiated Contact with Patient 04/18/18 1143     (approximate)  I have reviewed the triage vital signs and the nursing notes.   HISTORY  Chief Complaint Hip Pain    HPI Cindy [name] is a 67 y.o. female patient complains of 6 months of left hip pain.  Patient denies provoking incident for complaint.  Patient states she is now developing right hip pain secondary to compensating for the discomfort on the left side.  Patient has a history of degenerative lumbar spinal complain.  Patient currently receives steroidal injections to the lumbar spine every 6 months.  Patient rates her hip pain is a 10/10.  Patient the pain is now interfering with ambulation.  Patient described the pain as "achy".  Past Medical History:  Diagnosis Date  . Chronic pain syndrome   . Depression   . Diabetes mellitus without complication ([place])   . GERD (gastroesophageal reflux disease)   . History of hiatal hernia   . Hypertension   . Iron deficiency anemia   . Microalbuminuric diabetic nephropathy ([place])   . Mitral regurgitation   . Obesity   . Shortness of breath dyspnea   . Sleep apnea     Patient Active Problem List   Diagnosis Date Noted  . Chest pain 09/07/2015    Past Surgical History:  Procedure Laterality Date  . ABDOMINAL HYSTERECTOMY    . BACK SURGERY    . BREAST BIOPSY Right 03/2003  . CARDIAC CATHETERIZATION N/A 10/10/2014   Procedure: Left Heart Cath;  Surgeon: Isaias Cowman, MD;  Location: [place] [facility];  Service: Cardiovascular;  Laterality: N/A;  . CARDIAC CATHETERIZATION    . carpal tunnell release  10/23/11  . COLONOSCOPY WITH PROPOFOL N/A 02/20/2015   Procedure: COLONOSCOPY WITH PROPOFOL;  Surgeon: Hulen Luster, MD;  Location: [facility] ENDOSCOPY;  Service: Gastroenterology;  Laterality: N/A;  . COLONOSCOPY WITH PROPOFOL N/A 04/26/2017   Procedure: COLONOSCOPY WITH PROPOFOL;  Surgeon: Toledo, Benay Pike, MD;  Location: [facility] ENDOSCOPY;  Service: Gastroenterology;  Laterality: N/A;  . ESOPHAGOGASTRODUODENOSCOPY (EGD) WITH PROPOFOL N/A 02/20/2015   Procedure: ESOPHAGOGASTRODUODENOSCOPY (EGD) WITH PROPOFOL;  Surgeon: Hulen Luster, MD;  Location: [facility] ENDOSCOPY;  Service: Gastroenterology;  Laterality: N/A;  . ESOPHAGOGASTRODUODENOSCOPY (EGD) WITH PROPOFOL N/A 04/26/2017   Procedure: ESOPHAGOGASTRODUODENOSCOPY (EGD) WITH PROPOFOL;  Surgeon: Toledo, Benay Pike, MD;  Location: [facility] ENDOSCOPY;  Service: Gastroenterology;  Laterality: N/A;  . HERNIA REPAIR    . LAPAROSCOPIC OVARIAN CYSTECTOMY  08/2001  . nissen fundoplycation  11/99  . NOSE SURGERY      Prior to Admission medications   Medication Sig Start Date End Date Taking? Authorizing Provider  acetaminophen (TYLENOL) 650 MG CR tablet Take 650 mg by mouth every 8 (eight) hours as needed for pain.    [provider]  amLODipine (NORVASC) 10 MG tablet Take 10 mg by mouth daily.    [provider]  apixaban (ELIQUIS) 5 MG TABS tablet Take 1 tablet (5 mg total) by mouth 2 (two) times daily. 09/07/15   Hillary Bow, MD  aspirin EC 81 MG tablet Take 81 mg by mouth daily.    [provider]  atorvastatin (LIPITOR) 40 MG tablet Take 40 mg by mouth daily.    [provider]  cetirizine (ZYRTEC) 10 MG tablet Take 10 mg by mouth daily.    [provider]  DULoxetine (CYMBALTA) 30 MG capsule Take  30 mg by mouth daily.    [provider]  fluticasone (FLONASE) 50 MCG/ACT nasal spray Place 2 sprays into both nostrils daily.    [provider]  furosemide (LASIX) 80 MG tablet Take 80 mg by mouth 2 (two) times daily.    [provider]  glimepiride (AMARYL) 2 MG tablet Take 2-4 mg by mouth 2 (two) times daily. Take 4mg  in the morning and 2mg  in the evening    [provider]  lisinopril (PRINIVIL,ZESTRIL) 40 MG tablet Take  40 mg by mouth daily.    [provider]  meloxicam (MOBIC) 7.5 MG tablet Take 7.5 mg by mouth daily.    [provider]  meloxicam (MOBIC) 7.5 MG tablet Take 1 tablet (7.5 mg total) by mouth daily. 04/18/18   Sable Feil, PA-C  metFORMIN (GLUCOPHAGE) 1000 MG tablet Take 1,000 mg by mouth 2 (two) times daily with a meal.    [provider]  metoprolol succinate (TOPROL-XL) 100 MG 24 hr tablet Take 50-100 mg by mouth 2 (two) times daily. Take 1 tablet in the morning and one-half tablet in the evening. Take with or immediately following a meal.    [provider]  minoxidil (LONITEN) 10 MG tablet Take 10 mg by mouth daily.    [provider]  montelukast (SINGULAIR) 10 MG tablet Take 10 mg by mouth daily as needed (allergies).  06/16/15   [provider]  Multiple Vitamin (MULTIVITAMIN WITH MINERALS) TABS tablet Take 1 tablet by mouth daily.    [provider]  omeprazole (PRILOSEC) 20 MG capsule Take 20 mg by mouth daily as needed (heartburn).     [provider]  polyethylene glycol powder (GLYCOLAX/MIRALAX) powder Take 17 g by mouth daily.     [provider]  potassium chloride SA (K-DUR,KLOR-CON) 20 MEQ tablet Take 40 mEq by mouth 2 (two) times daily.    [provider]  tiZANidine (ZANAFLEX) 4 MG capsule Take 4 mg by mouth 2 (two) times daily as needed for muscle spasms.     [provider]  traMADol (ULTRAM) 50 MG tablet Take 50 mg by mouth 3 (three) times daily as needed for moderate pain.     [provider]  traMADol (ULTRAM) 50 MG tablet Take 1 tablet (50 mg total) by mouth every 12 (twelve) hours as needed. 04/18/18   Sable Feil, PA-C    Allergies Vicodin [hydrocodone-acetaminophen] and Vicoprofen [hydrocodone-ibuprofen]  Family History  Problem Relation Age of Onset  . Heart attack Mother   . Hypertension Father   . Stroke Father   . Heart attack Brother     Social  History Social History   Tobacco Use  . Smoking status: Never Smoker  . Smokeless tobacco: Current User    Types: Snuff  Substance Use Topics  . Alcohol use: No  . Drug use: No    Review of Systems Constitutional: No fever/chills Eyes: No visual changes. ENT: No sore throat. Cardiovascular: Denies chest pain. Respiratory: Denies shortness of breath. Gastrointestinal: No abdominal pain.  No nausea, no vomiting.  No diarrhea.  No constipation. Genitourinary: Negative for dysuria. Musculoskeletal: Left hip pain. Skin: Negative for rash. Neurological: Negative for headaches, focal weakness or numbness. Psychiatric:Depression Endocrine:Diabetes and hypertension. Hematological/Lymphatic: Iron deficiency anemia. Allergic/Immunilogical: Vicoprofen.  ____________________________________________   PHYSICAL EXAM:  VITAL SIGNS: ED Triage Vitals [04/18/18 1138]  Enc Vitals Group     BP (!) 163/85     Pulse Rate 74  Resp 16     Temp 98.3 F (36.8 C)     Temp Source Oral     SpO2 98 %     Weight      Height      Head Circumference      Peak Flow      Pain Score 10     Pain Loc      Pain Edu?      Excl. in [place]?     Constitutional: Alert and oriented. Well appearing and in no acute distress.  Morbid obesity. Hematological/Lymphatic/Immunilogical: No cervical lymphadenopathy. Cardiovascular: Normal rate, regular rhythm. Grossly normal heart sounds.  Good peripheral circulation.  Elevated blood pressure. Respiratory: Normal respiratory effort.  No retractions. Lungs CTAB.   Musculoskeletal: No lower extremity tenderness nor edema.  No joint effusions. Neurologic:  Normal speech and language. No gross focal neurologic deficits are appreciated. No gait instability. Skin:  Skin is warm, dry and intact. No rash noted. Psychiatric: Mood and affect are normal. Speech and behavior are normal.  ____________________________________________   LABS (all labs ordered are listed,  but only abnormal results are displayed)  Labs Reviewed - No data to display ____________________________________________  EKG   ____________________________________________  RADIOLOGY  ED MD interpretation:    Official radiology report(s): Dg Hip Unilat W Or Wo Pelvis 2-3 Views Left  Result Date: 04/18/2018 CLINICAL DATA:  67 year old female with increased pain. No injury. Difficulty ambulating. Initial encounter. EXAM: DG HIP (WITH OR WITHOUT PELVIS) 2-3V LEFT COMPARISON:  01/24/2014 CT. FINDINGS: Pelvic tilt.  This may be related to positioning. Mild bilateral hip joint degenerative changes. No fracture or dislocation. No plain film evidence of left femoral head avascular necrosis. Mild degenerative changes pubic symphysis. Sacroiliac joints intact. Postsurgical changes lower lumbar spine. IMPRESSION: 1. Mild bilateral hip joint degenerative changes. 2. Mild degenerative changes pubic symphysis. 3. Postsurgical changes lower lumbar spine. Electronically Signed   By: Genia Del M.D.   On: 04/18/2018 12:16    ____________________________________________   PROCEDURES  Procedure(s) performed:   Procedures  Critical Care performed: No  ____________________________________________   INITIAL IMPRESSION / ASSESSMENT AND PLAN / ED COURSE  As part of my medical decision making, I reviewed the following data within the electronic MEDICAL RECORD NUMBER    Left hip pain secondary to arthritis.  Discussed x-ray findings with patient.  Patient given discharge care instruction advised to follow orthopedic department for definitive evaluation and treatment.  Take medication as directed.      ____________________________________________   FINAL CLINICAL IMPRESSION(S) / ED DIAGNOSES  Final diagnoses:  Primary osteoarthritis of both hips     ED Discharge Orders         Ordered    meloxicam (MOBIC) 7.5 MG tablet  Daily     04/18/18 1251    traMADol (ULTRAM) 50 MG tablet  Every  12 hours PRN     04/18/18 1251           Note:  This document was prepared using Dragon voice recognition software and may include unintentional dictation errors.    Sable Feil, PA-C 04/18/18 1254    Eula Listen, MD 04/18/18 [phone]

## 2018-04-18 NOTE — ED Triage Notes (Addendum)
Pt c/o L hip pain, denies acute injury. PT hx of L hip pain with no relief x68months. Currently in PT for hip. VSS

## 2018-04-20 ENCOUNTER — Ambulatory Visit
Admission: RE | Admit: 2018-04-20 | Discharge: 2018-04-20 | Disposition: A | Discharge status: Home / Self Care | Payer: PPO | Source: Ambulatory Visit | Attending: Nephrology | Admitting: Nephrology

## 2018-04-20 DIAGNOSIS — N183 Chronic kidney disease, stage 3 unspecified: Secondary | ICD-10-CM

## 2018-04-26 DIAGNOSIS — Z6841 Body Mass Index (BMI) 40.0 and over, adult: Secondary | ICD-10-CM | POA: Diagnosis not present

## 2018-04-26 DIAGNOSIS — E1129 Type 2 diabetes mellitus with other diabetic kidney complication: Secondary | ICD-10-CM | POA: Diagnosis not present

## 2018-04-26 DIAGNOSIS — M160 Bilateral primary osteoarthritis of hip: Secondary | ICD-10-CM | POA: Diagnosis not present

## 2018-04-26 DIAGNOSIS — M7061 Trochanteric bursitis, right hip: Secondary | ICD-10-CM | POA: Diagnosis not present

## 2018-04-26 DIAGNOSIS — Z981 Arthrodesis status: Secondary | ICD-10-CM | POA: Diagnosis not present

## 2018-04-26 DIAGNOSIS — M5440 Lumbago with sciatica, unspecified side: Secondary | ICD-10-CM | POA: Diagnosis not present

## 2018-04-26 DIAGNOSIS — M7062 Trochanteric bursitis, left hip: Secondary | ICD-10-CM | POA: Diagnosis not present

## 2018-04-26 DIAGNOSIS — R809 Proteinuria, unspecified: Secondary | ICD-10-CM | POA: Diagnosis not present

## 2018-05-02 DIAGNOSIS — M1612 Unilateral primary osteoarthritis, left hip: Secondary | ICD-10-CM | POA: Diagnosis not present

## 2018-05-02 DIAGNOSIS — E876 Hypokalemia: Secondary | ICD-10-CM | POA: Diagnosis not present

## 2018-05-11 DIAGNOSIS — E1122 Type 2 diabetes mellitus with diabetic chronic kidney disease: Secondary | ICD-10-CM | POA: Diagnosis not present

## 2018-05-11 DIAGNOSIS — R809 Proteinuria, unspecified: Secondary | ICD-10-CM | POA: Diagnosis not present

## 2018-05-11 DIAGNOSIS — N183 Chronic kidney disease, stage 3 (moderate): Secondary | ICD-10-CM | POA: Diagnosis not present

## 2018-05-11 DIAGNOSIS — I10 Essential (primary) hypertension: Secondary | ICD-10-CM | POA: Diagnosis not present

## 2018-05-15 DIAGNOSIS — M1611 Unilateral primary osteoarthritis, right hip: Secondary | ICD-10-CM | POA: Diagnosis not present

## 2018-06-07 DIAGNOSIS — I340 Nonrheumatic mitral (valve) insufficiency: Secondary | ICD-10-CM | POA: Diagnosis not present

## 2018-06-07 DIAGNOSIS — R600 Localized edema: Secondary | ICD-10-CM | POA: Diagnosis not present

## 2018-06-07 DIAGNOSIS — G4733 Obstructive sleep apnea (adult) (pediatric): Secondary | ICD-10-CM | POA: Diagnosis not present

## 2018-06-07 DIAGNOSIS — E7800 Pure hypercholesterolemia, unspecified: Secondary | ICD-10-CM | POA: Diagnosis not present

## 2018-06-07 DIAGNOSIS — I10 Essential (primary) hypertension: Secondary | ICD-10-CM | POA: Diagnosis not present

## 2018-06-07 DIAGNOSIS — I480 Paroxysmal atrial fibrillation: Secondary | ICD-10-CM | POA: Diagnosis not present

## 2018-07-19 DIAGNOSIS — M160 Bilateral primary osteoarthritis of hip: Secondary | ICD-10-CM | POA: Diagnosis not present

## 2018-07-19 DIAGNOSIS — M533 Sacrococcygeal disorders, not elsewhere classified: Secondary | ICD-10-CM | POA: Diagnosis not present

## 2018-07-19 DIAGNOSIS — M545 Low back pain: Secondary | ICD-10-CM | POA: Diagnosis not present

## 2018-07-19 DIAGNOSIS — Z981 Arthrodesis status: Secondary | ICD-10-CM | POA: Diagnosis not present

## 2018-07-19 DIAGNOSIS — M47817 Spondylosis without myelopathy or radiculopathy, lumbosacral region: Secondary | ICD-10-CM | POA: Diagnosis not present

## 2018-08-11 DIAGNOSIS — G4733 Obstructive sleep apnea (adult) (pediatric): Secondary | ICD-10-CM | POA: Diagnosis not present

## 2018-09-27 DIAGNOSIS — K219 Gastro-esophageal reflux disease without esophagitis: Secondary | ICD-10-CM | POA: Diagnosis not present

## 2018-09-27 DIAGNOSIS — J309 Allergic rhinitis, unspecified: Secondary | ICD-10-CM | POA: Diagnosis not present

## 2018-09-27 DIAGNOSIS — D509 Iron deficiency anemia, unspecified: Secondary | ICD-10-CM | POA: Diagnosis not present

## 2018-09-27 DIAGNOSIS — E7800 Pure hypercholesterolemia, unspecified: Secondary | ICD-10-CM | POA: Diagnosis not present

## 2018-09-27 DIAGNOSIS — M5136 Other intervertebral disc degeneration, lumbar region: Secondary | ICD-10-CM | POA: Diagnosis not present

## 2018-09-27 DIAGNOSIS — E1129 Type 2 diabetes mellitus with other diabetic kidney complication: Secondary | ICD-10-CM | POA: Diagnosis not present

## 2018-09-27 DIAGNOSIS — I480 Paroxysmal atrial fibrillation: Secondary | ICD-10-CM | POA: Diagnosis not present

## 2018-09-27 DIAGNOSIS — E1159 Type 2 diabetes mellitus with other circulatory complications: Secondary | ICD-10-CM | POA: Diagnosis not present

## 2018-09-27 DIAGNOSIS — G4733 Obstructive sleep apnea (adult) (pediatric): Secondary | ICD-10-CM | POA: Diagnosis not present

## 2018-09-27 DIAGNOSIS — Z79899 Other long term (current) drug therapy: Secondary | ICD-10-CM | POA: Diagnosis not present

## 2018-09-27 DIAGNOSIS — Z6841 Body Mass Index (BMI) 40.0 and over, adult: Secondary | ICD-10-CM | POA: Diagnosis not present

## 2018-09-27 DIAGNOSIS — F3341 Major depressive disorder, recurrent, in partial remission: Secondary | ICD-10-CM | POA: Diagnosis not present

## 2018-09-27 DIAGNOSIS — R809 Proteinuria, unspecified: Secondary | ICD-10-CM | POA: Diagnosis not present

## 2018-10-31 DIAGNOSIS — E876 Hypokalemia: Secondary | ICD-10-CM | POA: Diagnosis not present

## 2018-12-20 DIAGNOSIS — R809 Proteinuria, unspecified: Secondary | ICD-10-CM | POA: Diagnosis not present

## 2018-12-20 DIAGNOSIS — E7800 Pure hypercholesterolemia, unspecified: Secondary | ICD-10-CM | POA: Diagnosis not present

## 2018-12-20 DIAGNOSIS — Z9889 Other specified postprocedural states: Secondary | ICD-10-CM | POA: Diagnosis not present

## 2018-12-20 DIAGNOSIS — I480 Paroxysmal atrial fibrillation: Secondary | ICD-10-CM | POA: Diagnosis not present

## 2018-12-20 DIAGNOSIS — E1129 Type 2 diabetes mellitus with other diabetic kidney complication: Secondary | ICD-10-CM | POA: Diagnosis not present

## 2018-12-20 DIAGNOSIS — G4733 Obstructive sleep apnea (adult) (pediatric): Secondary | ICD-10-CM | POA: Diagnosis not present

## 2018-12-20 DIAGNOSIS — E1159 Type 2 diabetes mellitus with other circulatory complications: Secondary | ICD-10-CM | POA: Diagnosis not present

## 2018-12-20 DIAGNOSIS — R600 Localized edema: Secondary | ICD-10-CM | POA: Diagnosis not present

## 2018-12-20 DIAGNOSIS — I10 Essential (primary) hypertension: Secondary | ICD-10-CM | POA: Diagnosis not present

## 2018-12-20 DIAGNOSIS — R0600 Dyspnea, unspecified: Secondary | ICD-10-CM | POA: Diagnosis not present

## 2019-01-04 DIAGNOSIS — M533 Sacrococcygeal disorders, not elsewhere classified: Secondary | ICD-10-CM | POA: Diagnosis not present

## 2019-01-04 DIAGNOSIS — M47817 Spondylosis without myelopathy or radiculopathy, lumbosacral region: Secondary | ICD-10-CM | POA: Diagnosis not present

## 2019-01-29 DIAGNOSIS — M25552 Pain in left hip: Secondary | ICD-10-CM | POA: Diagnosis not present

## 2019-01-29 DIAGNOSIS — M7062 Trochanteric bursitis, left hip: Secondary | ICD-10-CM | POA: Diagnosis not present

## 2019-01-29 DIAGNOSIS — M25559 Pain in unspecified hip: Secondary | ICD-10-CM | POA: Diagnosis not present

## 2019-02-28 DIAGNOSIS — I480 Paroxysmal atrial fibrillation: Secondary | ICD-10-CM | POA: Diagnosis not present

## 2019-02-28 DIAGNOSIS — E1129 Type 2 diabetes mellitus with other diabetic kidney complication: Secondary | ICD-10-CM | POA: Diagnosis not present

## 2019-02-28 DIAGNOSIS — E1159 Type 2 diabetes mellitus with other circulatory complications: Secondary | ICD-10-CM | POA: Diagnosis not present

## 2019-02-28 DIAGNOSIS — E876 Hypokalemia: Secondary | ICD-10-CM | POA: Diagnosis not present

## 2019-02-28 DIAGNOSIS — R809 Proteinuria, unspecified: Secondary | ICD-10-CM | POA: Diagnosis not present

## 2019-02-28 DIAGNOSIS — E7800 Pure hypercholesterolemia, unspecified: Secondary | ICD-10-CM | POA: Diagnosis not present

## 2019-02-28 DIAGNOSIS — Z79899 Other long term (current) drug therapy: Secondary | ICD-10-CM | POA: Diagnosis not present

## 2019-02-28 DIAGNOSIS — D509 Iron deficiency anemia, unspecified: Secondary | ICD-10-CM | POA: Diagnosis not present

## 2019-02-28 DIAGNOSIS — J309 Allergic rhinitis, unspecified: Secondary | ICD-10-CM | POA: Diagnosis not present

## 2019-02-28 DIAGNOSIS — K219 Gastro-esophageal reflux disease without esophagitis: Secondary | ICD-10-CM | POA: Diagnosis not present

## 2019-02-28 DIAGNOSIS — G4733 Obstructive sleep apnea (adult) (pediatric): Secondary | ICD-10-CM | POA: Diagnosis not present

## 2019-02-28 DIAGNOSIS — T502X5A Adverse effect of carbonic-anhydrase inhibitors, benzothiadiazides and other diuretics, initial encounter: Secondary | ICD-10-CM | POA: Diagnosis not present

## 2019-02-28 DIAGNOSIS — M5136 Other intervertebral disc degeneration, lumbar region: Secondary | ICD-10-CM | POA: Diagnosis not present

## 2019-02-28 DIAGNOSIS — Z0000 Encounter for general adult medical examination without abnormal findings: Secondary | ICD-10-CM | POA: Diagnosis not present

## 2019-02-28 DIAGNOSIS — M5416 Radiculopathy, lumbar region: Secondary | ICD-10-CM | POA: Diagnosis not present

## 2019-07-04 DIAGNOSIS — I480 Paroxysmal atrial fibrillation: Secondary | ICD-10-CM | POA: Diagnosis not present

## 2019-07-04 DIAGNOSIS — I340 Nonrheumatic mitral (valve) insufficiency: Secondary | ICD-10-CM | POA: Diagnosis not present

## 2019-07-04 DIAGNOSIS — I10 Essential (primary) hypertension: Secondary | ICD-10-CM | POA: Diagnosis not present

## 2019-07-04 DIAGNOSIS — E7800 Pure hypercholesterolemia, unspecified: Secondary | ICD-10-CM | POA: Diagnosis not present

## 2019-07-04 DIAGNOSIS — Z9889 Other specified postprocedural states: Secondary | ICD-10-CM | POA: Diagnosis not present

## 2019-07-04 DIAGNOSIS — R600 Localized edema: Secondary | ICD-10-CM | POA: Diagnosis not present

## 2019-07-04 DIAGNOSIS — R809 Proteinuria, unspecified: Secondary | ICD-10-CM | POA: Diagnosis not present

## 2019-07-04 DIAGNOSIS — E1159 Type 2 diabetes mellitus with other circulatory complications: Secondary | ICD-10-CM | POA: Diagnosis not present

## 2019-08-09 IMAGING — CR DG CHEST 2V
2 series · 2 of 2 positions shown · non-contrast
Comparison: 09/07/2015

CLINICAL DATA: Shortness of breath.  Palpitations.

EXAM:
CHEST  2 VIEW

[chest pa]
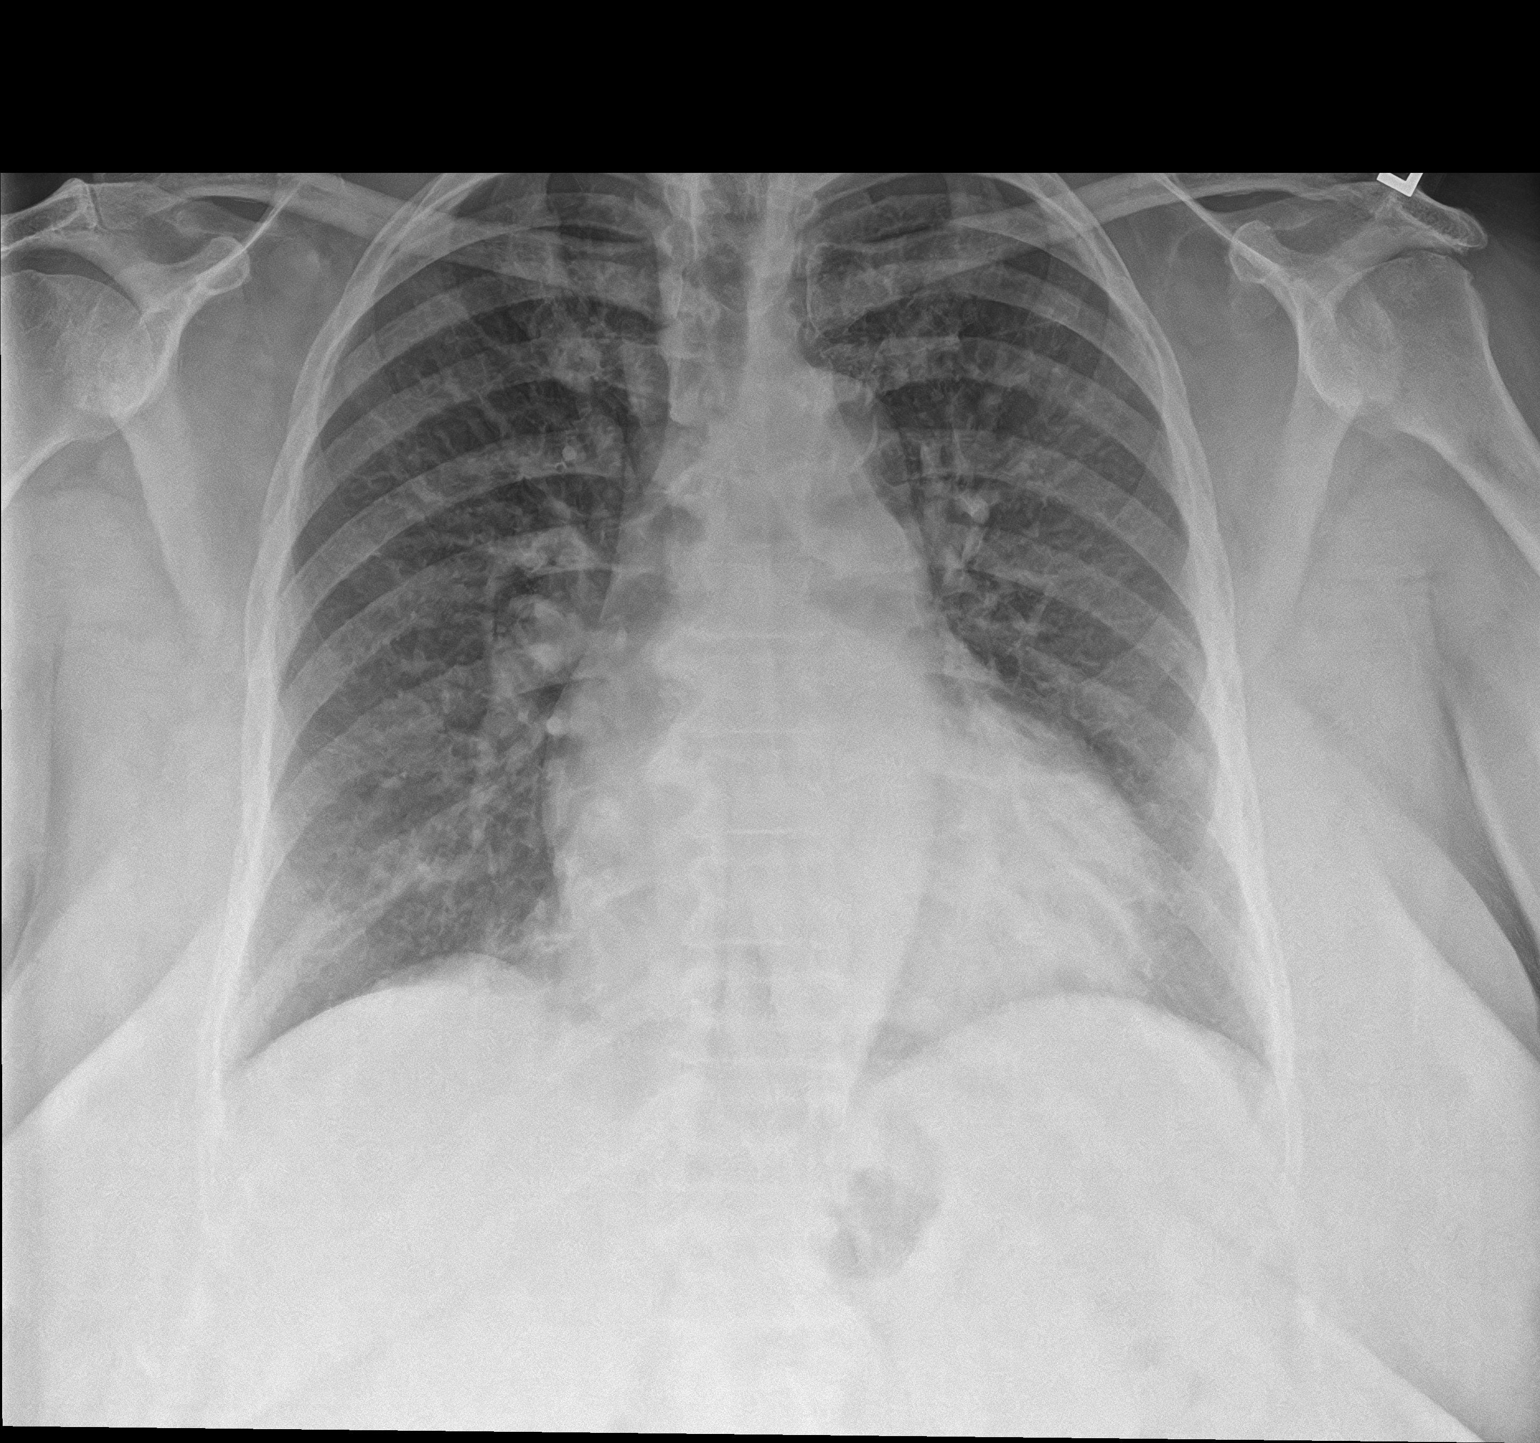

[chest lat]
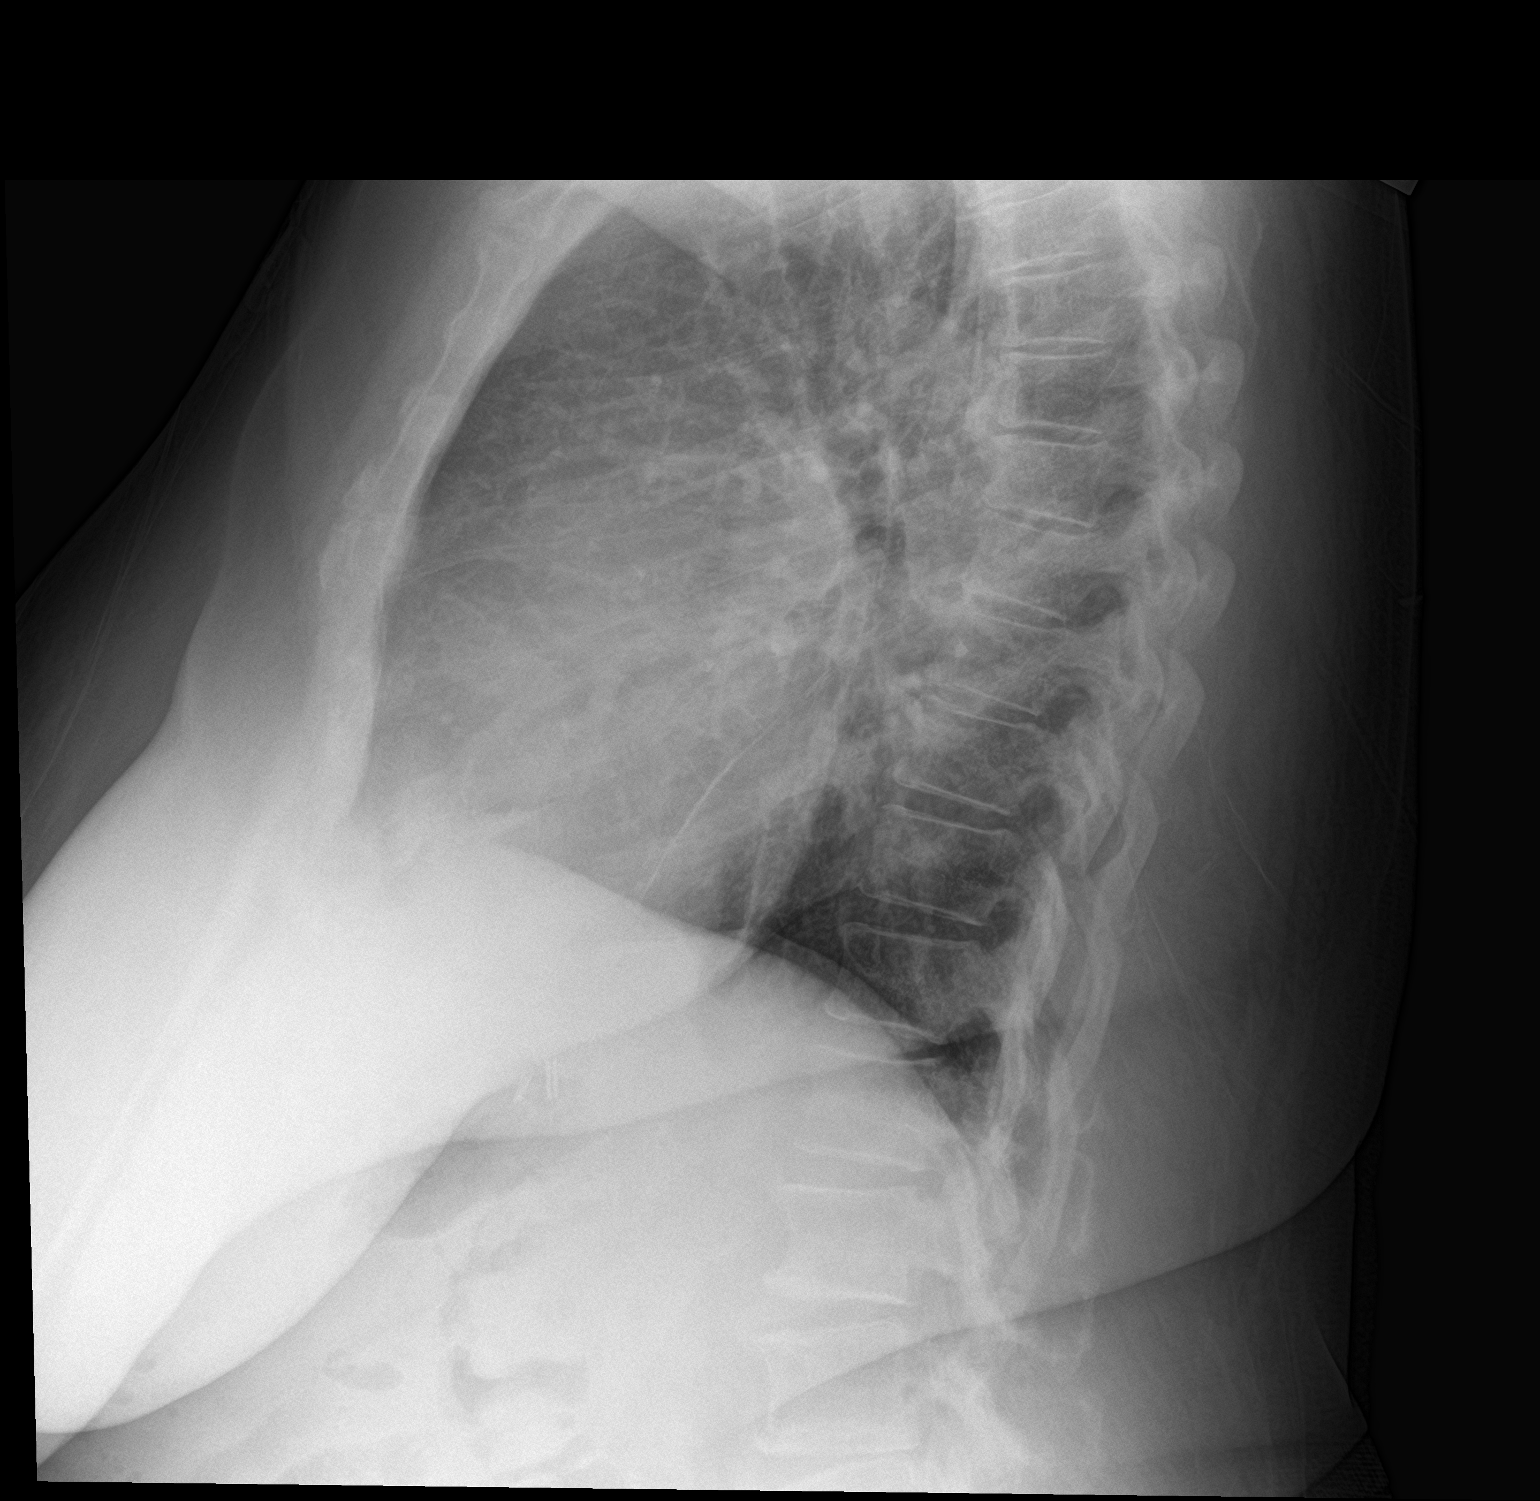

[2 of 2 positions shown; findings below may reference images not displayed]

FINDINGS: Mild cardiomegaly and pulmonary vascular congestion are unchanged in
appearance. No evidence of acute pulmonary edema or other
infiltrate. No evidence of pneumothorax or pleural effusion.
IMPRESSION: Stable mild cardiomegaly and chronic pulmonary vascular congestion.
No acute findings.

## 2019-09-05 DIAGNOSIS — J309 Allergic rhinitis, unspecified: Secondary | ICD-10-CM | POA: Diagnosis not present

## 2019-09-05 DIAGNOSIS — E1129 Type 2 diabetes mellitus with other diabetic kidney complication: Secondary | ICD-10-CM | POA: Diagnosis not present

## 2019-09-05 DIAGNOSIS — E1159 Type 2 diabetes mellitus with other circulatory complications: Secondary | ICD-10-CM | POA: Diagnosis not present

## 2019-09-05 DIAGNOSIS — R809 Proteinuria, unspecified: Secondary | ICD-10-CM | POA: Diagnosis not present

## 2019-09-05 DIAGNOSIS — T502X5A Adverse effect of carbonic-anhydrase inhibitors, benzothiadiazides and other diuretics, initial encounter: Secondary | ICD-10-CM | POA: Diagnosis not present

## 2019-09-05 DIAGNOSIS — I480 Paroxysmal atrial fibrillation: Secondary | ICD-10-CM | POA: Diagnosis not present

## 2019-09-05 DIAGNOSIS — N183 Chronic kidney disease, stage 3 unspecified: Secondary | ICD-10-CM | POA: Diagnosis not present

## 2019-09-05 DIAGNOSIS — E1122 Type 2 diabetes mellitus with diabetic chronic kidney disease: Secondary | ICD-10-CM | POA: Diagnosis not present

## 2019-09-05 DIAGNOSIS — G4733 Obstructive sleep apnea (adult) (pediatric): Secondary | ICD-10-CM | POA: Diagnosis not present

## 2019-09-05 DIAGNOSIS — E7800 Pure hypercholesterolemia, unspecified: Secondary | ICD-10-CM | POA: Diagnosis not present

## 2019-09-05 DIAGNOSIS — Z79899 Other long term (current) drug therapy: Secondary | ICD-10-CM | POA: Diagnosis not present

## 2019-09-05 DIAGNOSIS — D509 Iron deficiency anemia, unspecified: Secondary | ICD-10-CM | POA: Diagnosis not present

## 2019-09-05 DIAGNOSIS — E876 Hypokalemia: Secondary | ICD-10-CM | POA: Diagnosis not present

## 2019-10-03 DIAGNOSIS — L738 Other specified follicular disorders: Secondary | ICD-10-CM | POA: Diagnosis not present

## 2019-10-03 DIAGNOSIS — B356 Tinea cruris: Secondary | ICD-10-CM | POA: Diagnosis not present

## 2019-11-14 DIAGNOSIS — H25813 Combined forms of age-related cataract, bilateral: Secondary | ICD-10-CM | POA: Diagnosis not present

## 2019-11-14 DIAGNOSIS — H18413 Arcus senilis, bilateral: Secondary | ICD-10-CM | POA: Diagnosis not present

## 2019-11-14 DIAGNOSIS — H40033 Anatomical narrow angle, bilateral: Secondary | ICD-10-CM | POA: Diagnosis not present

## 2019-11-14 DIAGNOSIS — H40023 Open angle with borderline findings, high risk, bilateral: Secondary | ICD-10-CM | POA: Diagnosis not present

## 2019-11-14 DIAGNOSIS — I10 Essential (primary) hypertension: Secondary | ICD-10-CM | POA: Diagnosis not present

## 2019-11-14 DIAGNOSIS — E113293 Type 2 diabetes mellitus with mild nonproliferative diabetic retinopathy without macular edema, bilateral: Secondary | ICD-10-CM | POA: Diagnosis not present

## 2019-11-14 DIAGNOSIS — H47021 Hemorrhage in optic nerve sheath, right eye: Secondary | ICD-10-CM | POA: Diagnosis not present

## 2019-11-14 DIAGNOSIS — H35033 Hypertensive retinopathy, bilateral: Secondary | ICD-10-CM | POA: Diagnosis not present

## 2019-11-14 DIAGNOSIS — E7800 Pure hypercholesterolemia, unspecified: Secondary | ICD-10-CM | POA: Diagnosis not present

## 2019-11-21 DIAGNOSIS — Z01818 Encounter for other preprocedural examination: Secondary | ICD-10-CM | POA: Diagnosis not present

## 2019-11-21 DIAGNOSIS — H2511 Age-related nuclear cataract, right eye: Secondary | ICD-10-CM | POA: Diagnosis not present

## 2019-11-21 DIAGNOSIS — H401131 Primary open-angle glaucoma, bilateral, mild stage: Secondary | ICD-10-CM | POA: Diagnosis not present

## 2019-11-21 DIAGNOSIS — H25013 Cortical age-related cataract, bilateral: Secondary | ICD-10-CM | POA: Diagnosis not present

## 2019-12-19 DIAGNOSIS — H2511 Age-related nuclear cataract, right eye: Secondary | ICD-10-CM | POA: Diagnosis not present

## 2019-12-19 DIAGNOSIS — H409 Unspecified glaucoma: Secondary | ICD-10-CM | POA: Diagnosis not present

## 2019-12-19 DIAGNOSIS — H401131 Primary open-angle glaucoma, bilateral, mild stage: Secondary | ICD-10-CM | POA: Diagnosis not present

## 2019-12-19 DIAGNOSIS — H401111 Primary open-angle glaucoma, right eye, mild stage: Secondary | ICD-10-CM | POA: Diagnosis not present

## 2019-12-26 DIAGNOSIS — H2511 Age-related nuclear cataract, right eye: Secondary | ICD-10-CM | POA: Diagnosis not present

## 2019-12-31 DIAGNOSIS — H409 Unspecified glaucoma: Secondary | ICD-10-CM | POA: Diagnosis not present

## 2019-12-31 DIAGNOSIS — H2512 Age-related nuclear cataract, left eye: Secondary | ICD-10-CM | POA: Diagnosis not present

## 2019-12-31 DIAGNOSIS — H401121 Primary open-angle glaucoma, left eye, mild stage: Secondary | ICD-10-CM | POA: Diagnosis not present

## 2019-12-31 DIAGNOSIS — H401131 Primary open-angle glaucoma, bilateral, mild stage: Secondary | ICD-10-CM | POA: Diagnosis not present

## 2020-01-28 DIAGNOSIS — Z0100 Encounter for examination of eyes and vision without abnormal findings: Secondary | ICD-10-CM | POA: Diagnosis not present

## 2020-01-28 DIAGNOSIS — Z961 Presence of intraocular lens: Secondary | ICD-10-CM | POA: Diagnosis not present

## 2020-01-28 DIAGNOSIS — H52223 Regular astigmatism, bilateral: Secondary | ICD-10-CM | POA: Diagnosis not present

## 2020-01-28 DIAGNOSIS — H524 Presbyopia: Secondary | ICD-10-CM | POA: Diagnosis not present

## 2020-01-29 DIAGNOSIS — H2512 Age-related nuclear cataract, left eye: Secondary | ICD-10-CM | POA: Diagnosis not present

## 2020-03-12 ENCOUNTER — Other Ambulatory Visit: Payer: Self-pay | Admitting: Internal Medicine

## 2020-03-12 DIAGNOSIS — Z79899 Other long term (current) drug therapy: Secondary | ICD-10-CM | POA: Diagnosis not present

## 2020-03-12 DIAGNOSIS — R809 Proteinuria, unspecified: Secondary | ICD-10-CM | POA: Diagnosis not present

## 2020-03-12 DIAGNOSIS — Z0000 Encounter for general adult medical examination without abnormal findings: Secondary | ICD-10-CM | POA: Diagnosis not present

## 2020-03-12 DIAGNOSIS — Z7901 Long term (current) use of anticoagulants: Secondary | ICD-10-CM | POA: Diagnosis not present

## 2020-03-12 DIAGNOSIS — E1159 Type 2 diabetes mellitus with other circulatory complications: Secondary | ICD-10-CM | POA: Diagnosis not present

## 2020-03-12 DIAGNOSIS — E876 Hypokalemia: Secondary | ICD-10-CM | POA: Diagnosis not present

## 2020-03-12 DIAGNOSIS — E7800 Pure hypercholesterolemia, unspecified: Secondary | ICD-10-CM | POA: Diagnosis not present

## 2020-03-12 DIAGNOSIS — T502X5A Adverse effect of carbonic-anhydrase inhibitors, benzothiadiazides and other diuretics, initial encounter: Secondary | ICD-10-CM | POA: Diagnosis not present

## 2020-03-12 DIAGNOSIS — M5136 Other intervertebral disc degeneration, lumbar region: Secondary | ICD-10-CM | POA: Diagnosis not present

## 2020-03-12 DIAGNOSIS — J309 Allergic rhinitis, unspecified: Secondary | ICD-10-CM | POA: Diagnosis not present

## 2020-03-12 DIAGNOSIS — F3341 Major depressive disorder, recurrent, in partial remission: Secondary | ICD-10-CM | POA: Diagnosis not present

## 2020-03-12 DIAGNOSIS — K219 Gastro-esophageal reflux disease without esophagitis: Secondary | ICD-10-CM | POA: Diagnosis not present

## 2020-03-12 DIAGNOSIS — D6869 Other thrombophilia: Secondary | ICD-10-CM | POA: Insufficient documentation

## 2020-03-12 DIAGNOSIS — Z1239 Encounter for other screening for malignant neoplasm of breast: Secondary | ICD-10-CM | POA: Diagnosis not present

## 2020-03-12 DIAGNOSIS — D509 Iron deficiency anemia, unspecified: Secondary | ICD-10-CM | POA: Diagnosis not present

## 2020-03-12 DIAGNOSIS — Z6841 Body Mass Index (BMI) 40.0 and over, adult: Secondary | ICD-10-CM | POA: Diagnosis not present

## 2020-03-12 DIAGNOSIS — I480 Paroxysmal atrial fibrillation: Secondary | ICD-10-CM | POA: Diagnosis not present

## 2020-03-12 DIAGNOSIS — G4733 Obstructive sleep apnea (adult) (pediatric): Secondary | ICD-10-CM | POA: Diagnosis not present

## 2020-03-12 DIAGNOSIS — E1129 Type 2 diabetes mellitus with other diabetic kidney complication: Secondary | ICD-10-CM | POA: Diagnosis not present

## 2020-03-12 DIAGNOSIS — Z1231 Encounter for screening mammogram for malignant neoplasm of breast: Secondary | ICD-10-CM

## 2020-03-19 DIAGNOSIS — G4733 Obstructive sleep apnea (adult) (pediatric): Secondary | ICD-10-CM | POA: Diagnosis not present

## 2020-03-19 DIAGNOSIS — R0600 Dyspnea, unspecified: Secondary | ICD-10-CM | POA: Diagnosis not present

## 2020-03-19 DIAGNOSIS — R600 Localized edema: Secondary | ICD-10-CM | POA: Diagnosis not present

## 2020-03-19 DIAGNOSIS — E1159 Type 2 diabetes mellitus with other circulatory complications: Secondary | ICD-10-CM | POA: Diagnosis not present

## 2020-03-19 DIAGNOSIS — I480 Paroxysmal atrial fibrillation: Secondary | ICD-10-CM | POA: Diagnosis not present

## 2020-03-19 DIAGNOSIS — E7800 Pure hypercholesterolemia, unspecified: Secondary | ICD-10-CM | POA: Diagnosis not present

## 2020-03-19 DIAGNOSIS — E1129 Type 2 diabetes mellitus with other diabetic kidney complication: Secondary | ICD-10-CM | POA: Diagnosis not present

## 2020-03-19 DIAGNOSIS — Z23 Encounter for immunization: Secondary | ICD-10-CM | POA: Diagnosis not present

## 2020-03-19 DIAGNOSIS — I340 Nonrheumatic mitral (valve) insufficiency: Secondary | ICD-10-CM | POA: Diagnosis not present

## 2020-04-22 ENCOUNTER — Ambulatory Visit
Admission: RE | Admit: 2020-04-22 | Discharge: 2020-04-22 | Disposition: A | Discharge status: Home / Self Care | Payer: Medicare HMO | Source: Ambulatory Visit | Attending: Internal Medicine | Admitting: Internal Medicine

## 2020-04-22 ENCOUNTER — Other Ambulatory Visit: Payer: Self-pay

## 2020-04-22 DIAGNOSIS — Z1231 Encounter for screening mammogram for malignant neoplasm of breast: Secondary | ICD-10-CM | POA: Diagnosis not present

## 2020-05-07 DIAGNOSIS — H40023 Open angle with borderline findings, high risk, bilateral: Secondary | ICD-10-CM | POA: Diagnosis not present

## 2020-07-15 ENCOUNTER — Other Ambulatory Visit: Payer: Self-pay

## 2020-07-15 ENCOUNTER — Inpatient Hospital Stay
Admission: EM | Admit: 2020-07-15 | Discharge: 2020-07-18 | DRG: 177 | Disposition: A | Discharge status: Home / Self Care | Payer: Medicare HMO | Attending: Internal Medicine | Admitting: Internal Medicine

## 2020-07-15 ENCOUNTER — Ambulatory Visit: Payer: Medicare HMO

## 2020-07-15 ENCOUNTER — Emergency Department: Payer: Medicare HMO

## 2020-07-15 ENCOUNTER — Ambulatory Visit (INDEPENDENT_AMBULATORY_CARE_PROVIDER_SITE_OTHER)
Admission: EM | Admit: 2020-07-15 | Discharge: 2020-07-15 | Disposition: A | Discharge status: Home / Self Care | Payer: Medicare HMO | Source: Home / Self Care | Attending: Family Medicine | Admitting: Family Medicine

## 2020-07-15 DIAGNOSIS — I517 Cardiomegaly: Secondary | ICD-10-CM | POA: Diagnosis not present

## 2020-07-15 DIAGNOSIS — G894 Chronic pain syndrome: Secondary | ICD-10-CM | POA: Diagnosis present

## 2020-07-15 DIAGNOSIS — I4891 Unspecified atrial fibrillation: Secondary | ICD-10-CM

## 2020-07-15 DIAGNOSIS — Z6841 Body Mass Index (BMI) 40.0 and over, adult: Secondary | ICD-10-CM

## 2020-07-15 DIAGNOSIS — Z7901 Long term (current) use of anticoagulants: Secondary | ICD-10-CM

## 2020-07-15 DIAGNOSIS — I959 Hypotension, unspecified: Secondary | ICD-10-CM | POA: Diagnosis not present

## 2020-07-15 DIAGNOSIS — E611 Iron deficiency: Secondary | ICD-10-CM | POA: Diagnosis not present

## 2020-07-15 DIAGNOSIS — K219 Gastro-esophageal reflux disease without esophagitis: Secondary | ICD-10-CM

## 2020-07-15 DIAGNOSIS — N179 Acute kidney failure, unspecified: Secondary | ICD-10-CM

## 2020-07-15 DIAGNOSIS — Z8249 Family history of ischemic heart disease and other diseases of the circulatory system: Secondary | ICD-10-CM

## 2020-07-15 DIAGNOSIS — E11649 Type 2 diabetes mellitus with hypoglycemia without coma: Secondary | ICD-10-CM | POA: Diagnosis present

## 2020-07-15 DIAGNOSIS — Z794 Long term (current) use of insulin: Secondary | ICD-10-CM | POA: Diagnosis not present

## 2020-07-15 DIAGNOSIS — R0603 Acute respiratory distress: Secondary | ICD-10-CM | POA: Diagnosis present

## 2020-07-15 DIAGNOSIS — U071 COVID-19: Secondary | ICD-10-CM | POA: Diagnosis present

## 2020-07-15 DIAGNOSIS — Z79899 Other long term (current) drug therapy: Secondary | ICD-10-CM

## 2020-07-15 DIAGNOSIS — I480 Paroxysmal atrial fibrillation: Secondary | ICD-10-CM

## 2020-07-15 DIAGNOSIS — E1121 Type 2 diabetes mellitus with diabetic nephropathy: Secondary | ICD-10-CM | POA: Diagnosis present

## 2020-07-15 DIAGNOSIS — J1282 Pneumonia due to coronavirus disease 2019: Secondary | ICD-10-CM | POA: Diagnosis present

## 2020-07-15 DIAGNOSIS — I10 Essential (primary) hypertension: Secondary | ICD-10-CM | POA: Diagnosis present

## 2020-07-15 DIAGNOSIS — E785 Hyperlipidemia, unspecified: Secondary | ICD-10-CM | POA: Diagnosis present

## 2020-07-15 DIAGNOSIS — F32A Depression, unspecified: Secondary | ICD-10-CM | POA: Diagnosis present

## 2020-07-15 DIAGNOSIS — E875 Hyperkalemia: Secondary | ICD-10-CM | POA: Diagnosis present

## 2020-07-15 DIAGNOSIS — Z888 Allergy status to other drugs, medicaments and biological substances status: Secondary | ICD-10-CM

## 2020-07-15 DIAGNOSIS — E669 Obesity, unspecified: Secondary | ICD-10-CM | POA: Diagnosis present

## 2020-07-15 DIAGNOSIS — Z823 Family history of stroke: Secondary | ICD-10-CM

## 2020-07-15 DIAGNOSIS — J9601 Acute respiratory failure with hypoxia: Secondary | ICD-10-CM | POA: Diagnosis present

## 2020-07-15 DIAGNOSIS — Z8616 Personal history of COVID-19: Secondary | ICD-10-CM | POA: Diagnosis present

## 2020-07-15 DIAGNOSIS — J189 Pneumonia, unspecified organism: Secondary | ICD-10-CM | POA: Diagnosis not present

## 2020-07-15 DIAGNOSIS — Z791 Long term (current) use of non-steroidal anti-inflammatories (NSAID): Secondary | ICD-10-CM

## 2020-07-15 DIAGNOSIS — G4733 Obstructive sleep apnea (adult) (pediatric): Secondary | ICD-10-CM | POA: Diagnosis present

## 2020-07-15 DIAGNOSIS — Z720 Tobacco use: Secondary | ICD-10-CM

## 2020-07-15 DIAGNOSIS — D509 Iron deficiency anemia, unspecified: Secondary | ICD-10-CM | POA: Diagnosis present

## 2020-07-15 DIAGNOSIS — R5381 Other malaise: Secondary | ICD-10-CM | POA: Diagnosis not present

## 2020-07-15 DIAGNOSIS — F321 Major depressive disorder, single episode, moderate: Secondary | ICD-10-CM

## 2020-07-15 DIAGNOSIS — E119 Type 2 diabetes mellitus without complications: Secondary | ICD-10-CM

## 2020-07-15 DIAGNOSIS — Z7982 Long term (current) use of aspirin: Secondary | ICD-10-CM

## 2020-07-15 DIAGNOSIS — E1159 Type 2 diabetes mellitus with other circulatory complications: Secondary | ICD-10-CM

## 2020-07-15 DIAGNOSIS — I152 Hypertension secondary to endocrine disorders: Secondary | ICD-10-CM

## 2020-07-15 DIAGNOSIS — E1169 Type 2 diabetes mellitus with other specified complication: Secondary | ICD-10-CM

## 2020-07-15 DIAGNOSIS — Z7984 Long term (current) use of oral hypoglycemic drugs: Secondary | ICD-10-CM

## 2020-07-15 DIAGNOSIS — R059 Cough, unspecified: Secondary | ICD-10-CM | POA: Diagnosis not present

## 2020-07-15 LAB — CBC WITH DIFFERENTIAL/PLATELET
Abs Immature Granulocytes: 0.01 10*3/uL (ref 0.00–0.07)
Basophils Absolute: 0 10*3/uL (ref 0.0–0.1)
Basophils Relative: 0 %
Eosinophils Absolute: 0 10*3/uL (ref 0.0–0.5)
Eosinophils Relative: 0 %
HCT: 31.9 % — ABNORMAL LOW (ref 36.0–46.0)
Hemoglobin: 9.7 g/dL — ABNORMAL LOW (ref 12.0–15.0)
Immature Granulocytes: 0 %
Lymphocytes Relative: 16 %
Lymphs Abs: 0.6 10*3/uL — ABNORMAL LOW (ref 0.7–4.0)
MCH: 27.1 pg (ref 26.0–34.0)
MCHC: 30.4 g/dL (ref 30.0–36.0)
MCV: 89.1 fL (ref 80.0–100.0)
Monocytes Absolute: 0.2 10*3/uL (ref 0.1–1.0)
Monocytes Relative: 6 %
Neutro Abs: 2.7 10*3/uL (ref 1.7–7.7)
Neutrophils Relative %: 78 %
Platelets: 205 10*3/uL (ref 150–400)
RBC: 3.58 MIL/uL — ABNORMAL LOW (ref 3.87–5.11)
RDW: 14.4 % (ref 11.5–15.5)
WBC: 3.5 10*3/uL — ABNORMAL LOW (ref 4.0–10.5)
nRBC: 0 % (ref 0.0–0.2)

## 2020-07-15 LAB — COMPREHENSIVE METABOLIC PANEL
ALT: 14 U/L (ref 0–44)
AST: 28 U/L (ref 15–41)
Albumin: 4.1 g/dL (ref 3.5–5.0)
Alkaline Phosphatase: 63 U/L (ref 38–126)
Anion gap: 14 (ref 5–15)
BUN: 35 mg/dL — ABNORMAL HIGH (ref 8–23)
CO2: 20 mmol/L — ABNORMAL LOW (ref 22–32)
Calcium: 8.5 mg/dL — ABNORMAL LOW (ref 8.9–10.3)
Chloride: 100 mmol/L (ref 98–111)
Creatinine, Ser: 2.18 mg/dL — ABNORMAL HIGH (ref 0.44–1.00)
GFR, Estimated: 24 mL/min — ABNORMAL LOW (ref >60)
Glucose, Bld: 54 mg/dL — ABNORMAL LOW (ref 70–99)
Potassium: 4.6 mmol/L (ref 3.5–5.1)
Sodium: 134 mmol/L — ABNORMAL LOW (ref 135–145)
Total Bilirubin: 0.6 mg/dL (ref 0.3–1.2)
Total Protein: 8.4 g/dL — ABNORMAL HIGH (ref 6.5–8.1)

## 2020-07-15 LAB — FERRITIN: Ferritin: 114 ng/mL (ref 11–307)

## 2020-07-15 LAB — IRON AND TIBC
Iron: 12 ug/dL — ABNORMAL LOW (ref 28–170)
Saturation Ratios: 3 % — ABNORMAL LOW (ref 10.4–31.8)
TIBC: 385 ug/dL (ref 250–450)
UIBC: 373 ug/dL

## 2020-07-15 LAB — CBG MONITORING, ED
Glucose-Capillary: 42 mg/dL — CL (ref 70–99)
Glucose-Capillary: 77 mg/dL (ref 70–99)

## 2020-07-15 LAB — RESP PANEL BY RT-PCR (FLU A&B, COVID) ARPGX2
Influenza A by PCR: NEGATIVE
Influenza B by PCR: NEGATIVE
SARS Coronavirus 2 by RT PCR: POSITIVE — AB

## 2020-07-15 LAB — FIBRIN DERIVATIVES D-DIMER (ARMC ONLY): Fibrin derivatives D-dimer (ARMC): 950.23 ng/mL (FEU) — ABNORMAL HIGH (ref 0.00–499.00)

## 2020-07-15 LAB — FIBRINOGEN: Fibrinogen: 627 mg/dL — ABNORMAL HIGH (ref 210–475)

## 2020-07-15 LAB — LACTATE DEHYDROGENASE: LDH: 299 U/L — ABNORMAL HIGH (ref 98–192)

## 2020-07-15 MED ORDER — ACETAMINOPHEN 500 MG PO TABS
1000.0000 mg | ORAL_TABLET | Freq: Once | ORAL | Status: AC
  Administered 2020-07-15: 1000 mg via ORAL

## 2020-07-15 MED ORDER — LACTATED RINGERS IV BOLUS
500.0000 mL | Freq: Once | INTRAVENOUS | Status: AC
  Administered 2020-07-15: 500 mL via INTRAVENOUS

## 2020-07-15 MED ORDER — ATORVASTATIN CALCIUM 20 MG PO TABS
40.0000 mg | ORAL_TABLET | Freq: Every day | ORAL | Status: DC
  Administered 2020-07-16 – 2020-07-18 (×3): 40 mg via ORAL
  Filled 2020-07-15 (×3): qty 2

## 2020-07-15 MED ORDER — ACETAMINOPHEN 325 MG PO TABS: 650.0000 mg | ORAL_TABLET | Freq: Four times a day (QID) | ORAL | Status: DC | PRN

## 2020-07-15 MED ORDER — POLYETHYLENE GLYCOL 3350 17 G PO PACK: 17.0000 g | PACK | Freq: Every day | ORAL | Status: DC | PRN

## 2020-07-15 MED ORDER — SODIUM CHLORIDE 0.9 % IV SOLN
200.0000 mg | Freq: Once | INTRAVENOUS | Status: AC
  Administered 2020-07-15: 200 mg via INTRAVENOUS
  Filled 2020-07-15: qty 200

## 2020-07-15 MED ORDER — INSULIN ASPART 100 UNIT/ML ~~LOC~~ SOLN
0.0000 [IU] | Freq: Three times a day (TID) | SUBCUTANEOUS | Status: DC
  Administered 2020-07-16: 12:00:00 9 [IU] via SUBCUTANEOUS
  Administered 2020-07-16: 5 [IU] via SUBCUTANEOUS
  Administered 2020-07-16 – 2020-07-17 (×2): 9 [IU] via SUBCUTANEOUS
  Administered 2020-07-17 (×2): 7 [IU] via SUBCUTANEOUS
  Administered 2020-07-18: 3 [IU] via SUBCUTANEOUS
  Administered 2020-07-18: 09:00:00 2 [IU] via SUBCUTANEOUS
  Filled 2020-07-15 (×9): qty 1

## 2020-07-15 MED ORDER — IPRATROPIUM-ALBUTEROL 20-100 MCG/ACT IN AERS
1.0000 | INHALATION_SPRAY | Freq: Four times a day (QID) | RESPIRATORY_TRACT | Status: DC | PRN
  Filled 2020-07-15: qty 4

## 2020-07-15 MED ORDER — SODIUM CHLORIDE 0.9% FLUSH
3.0000 mL | Freq: Two times a day (BID) | INTRAVENOUS | Status: DC
  Administered 2020-07-16 – 2020-07-18 (×5): 3 mL via INTRAVENOUS

## 2020-07-15 MED ORDER — DEXAMETHASONE SODIUM PHOSPHATE 10 MG/ML IJ SOLN
6.0000 mg | INTRAMUSCULAR | Status: DC
  Administered 2020-07-16 – 2020-07-18 (×3): 6 mg via INTRAVENOUS
  Filled 2020-07-15 (×3): qty 1

## 2020-07-15 MED ORDER — DILTIAZEM HCL ER COATED BEADS 180 MG PO CP24
360.0000 mg | ORAL_CAPSULE | Freq: Every day | ORAL | Status: DC
Start: 2020-07-16 — End: 2020-07-18
  Administered 2020-07-16 – 2020-07-18 (×3): 360 mg via ORAL
  Filled 2020-07-15 (×4): qty 2

## 2020-07-15 MED ORDER — DULOXETINE HCL 30 MG PO CPEP
30.0000 mg | ORAL_CAPSULE | Freq: Every day | ORAL | Status: DC
  Administered 2020-07-16 – 2020-07-18 (×3): 30 mg via ORAL
  Filled 2020-07-15 (×4): qty 1

## 2020-07-15 MED ORDER — SODIUM CHLORIDE 0.9 % IV SOLN
100.0000 mg | Freq: Every day | INTRAVENOUS | Status: DC
  Administered 2020-07-16 – 2020-07-18 (×3): 100 mg via INTRAVENOUS
  Filled 2020-07-15 (×3): qty 20

## 2020-07-15 MED ORDER — GUAIFENESIN-DM 100-10 MG/5ML PO SYRP: 10.0000 mL | ORAL_SOLUTION | ORAL | Status: DC | PRN

## 2020-07-15 MED ORDER — PANTOPRAZOLE SODIUM 40 MG PO TBEC
40.0000 mg | DELAYED_RELEASE_TABLET | Freq: Every day | ORAL | Status: DC
  Administered 2020-07-16 – 2020-07-18 (×3): 40 mg via ORAL
  Filled 2020-07-15 (×3): qty 1

## 2020-07-15 MED ORDER — METOPROLOL SUCCINATE ER 50 MG PO TB24
50.0000 mg | ORAL_TABLET | Freq: Every day | ORAL | Status: DC
  Administered 2020-07-16 – 2020-07-17 (×2): 50 mg via ORAL
  Filled 2020-07-15 (×3): qty 1

## 2020-07-15 MED ORDER — ASCORBIC ACID 500 MG PO TABS
500.0000 mg | ORAL_TABLET | Freq: Every day | ORAL | Status: DC
  Administered 2020-07-16 – 2020-07-18 (×3): 500 mg via ORAL
  Filled 2020-07-15 (×3): qty 1

## 2020-07-15 MED ORDER — TRAMADOL HCL 50 MG PO TABS: 50.0000 mg | ORAL_TABLET | Freq: Two times a day (BID) | ORAL | Status: DC | PRN

## 2020-07-15 MED ORDER — ZINC SULFATE 220 (50 ZN) MG PO CAPS
220.0000 mg | ORAL_CAPSULE | Freq: Every day | ORAL | Status: DC
  Administered 2020-07-16 – 2020-07-18 (×3): 220 mg via ORAL
  Filled 2020-07-15 (×3): qty 1

## 2020-07-15 MED ORDER — DEXAMETHASONE SODIUM PHOSPHATE 10 MG/ML IJ SOLN
10.0000 mg | Freq: Once | INTRAMUSCULAR | Status: AC
  Administered 2020-07-15: 10 mg via INTRAVENOUS
  Filled 2020-07-15: qty 1

## 2020-07-15 MED ORDER — LACTATED RINGERS IV SOLN: INTRAVENOUS | Status: AC

## 2020-07-15 MED ORDER — APIXABAN 5 MG PO TABS
5.0000 mg | ORAL_TABLET | Freq: Two times a day (BID) | ORAL | Status: DC
  Administered 2020-07-15 – 2020-07-18 (×6): 5 mg via ORAL
  Filled 2020-07-15 (×6): qty 1

## 2020-07-15 MED ORDER — METOPROLOL SUCCINATE ER 50 MG PO TB24
100.0000 mg | ORAL_TABLET | Freq: Every morning | ORAL | Status: DC
  Administered 2020-07-17 – 2020-07-18 (×2): 100 mg via ORAL
  Filled 2020-07-15: qty 2

## 2020-07-15 MED ORDER — SODIUM CHLORIDE 0.9 % IV BOLUS
1000.0000 mL | Freq: Once | INTRAVENOUS | Status: AC
  Administered 2020-07-15: 1000 mL via INTRAVENOUS

## 2020-07-15 NOTE — H&P (Signed)
History and Physical   Cindy [name] WPY:[id number] DOB: Sep 30, 1950 DOA: 07/15/2020  PCP: Ezequiel Kayser, MD   Patient coming from: Home  Chief Complaint: Sinus congestion, shortness of breath, fever  HPI: Cindy [name] is a 70 y.o. female with medical history significant of A. fib, chronic pain syndrome, depression, diabetes, GERD, hypertension, hyperlipidemia, iron deficiency anemia, OSA, obesity, diabetic nephropathy who presents with sinus congestion, cough, shortness of breath, fever, weakness.   As above patient has had about 2 weeks of feeling unwell worsening the last few days.  Her symptoms have consisted of sinus congestion with mild cough and mild shortness of breath.  She also is reporting some upper back pain, fever, weakness and diarrhea.  She initially presented to urgent care and was recommended to be further evaluated in the ED as she was requiring supplemental oxygen.   She had tried Coricidin at home. She reports some dark stools. She denies chest pain, abdominal pain, nausea.  ED Course: Vital signs in the ED significant for temperature to 101.3, respiratory rate in the teens to 20s, requiring 2 to 3 L to maintain saturations in the low 90s.  Blood pressure in the 053Z to 767 systolic.  Lab work-up showed BMP with sodium 134, bicarb 20, creatinine 2.18 up from a baseline of around 1.  Glucose 54 initially and then in the 40s on recheck.  CBC with leukopenia to 3.5, hemoglobin of 9.7 down from baseline of 10.7.  Patient is Covid positive.  Inflammatory markers: D-dimer 950, ferritin 114, fibrinogen 627, LDH 299.  Patient started on steroids and remdesivir in the ED.  She was also given snacks and juice for her hypoglycemia and a 1 L bolus.  Review of Systems: As per HPI otherwise all other systems reviewed and are negative.  Past Medical History:  Diagnosis Date  . Chronic pain syndrome   . Depression   . Diabetes mellitus without complication ([place])   . GERD  (gastroesophageal reflux disease)   . History of hiatal hernia   . Hypertension   . Iron deficiency anemia   . Microalbuminuric diabetic nephropathy ([place])   . Mitral regurgitation   . Obesity   . Shortness of breath dyspnea   . Sleep apnea     Past Surgical History:  Procedure Laterality Date  . ABDOMINAL HYSTERECTOMY    . BACK SURGERY    . BREAST BIOPSY Right 03/2003  . CARDIAC CATHETERIZATION N/A 10/10/2014   Procedure: Left Heart Cath;  Surgeon: Isaias Cowman, MD;  Location: [facility];  Service: Cardiovascular;  Laterality: N/A;  . CARDIAC CATHETERIZATION    . carpal tunnell release  10/23/11  . COLONOSCOPY WITH PROPOFOL N/A 02/20/2015   Procedure: COLONOSCOPY WITH PROPOFOL;  Surgeon: Hulen Luster, MD;  Location: [facility] ENDOSCOPY;  Service: Gastroenterology;  Laterality: N/A;  . COLONOSCOPY WITH PROPOFOL N/A 04/26/2017   Procedure: COLONOSCOPY WITH PROPOFOL;  Surgeon: Toledo, Benay Pike, MD;  Location: [facility] ENDOSCOPY;  Service: Gastroenterology;  Laterality: N/A;  . ESOPHAGOGASTRODUODENOSCOPY (EGD) WITH PROPOFOL N/A 02/20/2015   Procedure: ESOPHAGOGASTRODUODENOSCOPY (EGD) WITH PROPOFOL;  Surgeon: Hulen Luster, MD;  Location: [facility] ENDOSCOPY;  Service: Gastroenterology;  Laterality: N/A;  . ESOPHAGOGASTRODUODENOSCOPY (EGD) WITH PROPOFOL N/A 04/26/2017   Procedure: ESOPHAGOGASTRODUODENOSCOPY (EGD) WITH PROPOFOL;  Surgeon: Toledo, Benay Pike, MD;  Location: [facility] ENDOSCOPY;  Service: Gastroenterology;  Laterality: N/A;  . HERNIA REPAIR    . LAPAROSCOPIC OVARIAN CYSTECTOMY  08/2001  . nissen fundoplycation  11/99  . NOSE SURGERY  Social History  reports that she has never smoked. Her smokeless tobacco use includes snuff. She reports that she does not drink alcohol and does not use drugs.  Allergies  Allergen Reactions  . Bupropion Other (See Comments)  . Gabapentin Palpitations  . Pioglitazone Palpitations  . Vicodin [Hydrocodone-Acetaminophen] Other (See Comments)     Reaction: racing heart  . Vicoprofen [Hydrocodone-Ibuprofen] Other (See Comments)    Reaction: racing heart    Family History  Problem Relation Age of Onset  . Heart attack Mother   . Hypertension Father   . Stroke Father   . Heart attack Brother   Reviewed on admission  Prior to Admission medications   Medication Sig Start Date End Date Taking? Authorizing Provider  acetaminophen (TYLENOL) 650 MG CR tablet Take 650 mg by mouth every 8 (eight) hours as needed for pain.   Yes [provider]  amLODipine (NORVASC) 10 MG tablet Take 10 mg by mouth daily.   Yes [provider]  apixaban (ELIQUIS) 5 MG TABS tablet Take 1 tablet (5 mg total) by mouth 2 (two) times daily. 09/07/15  Yes Sudini, Alveta Heimlich, MD  aspirin EC 81 MG tablet Take 81 mg by mouth daily.   Yes [provider]  atorvastatin (LIPITOR) 40 MG tablet Take 40 mg by mouth daily.   Yes [provider]  cetirizine (ZYRTEC) 10 MG tablet Take 10 mg by mouth daily.   Yes [provider]  diltiazem (CARDIZEM CD) 360 MG 24 hr capsule Take 360 mg by mouth daily. 05/30/20  Yes [provider]  DULoxetine (CYMBALTA) 30 MG capsule Take 30 mg by mouth daily.   Yes [provider]  fluticasone (FLONASE) 50 MCG/ACT nasal spray Place 2 sprays into both nostrils daily.   Yes [provider]  furosemide (LASIX) 80 MG tablet Take 80 mg by mouth 2 (two) times daily.   Yes [provider]  glimepiride (AMARYL) 2 MG tablet Take 2-4 mg by mouth 2 (two) times daily. Take 4mg  in the morning and 2mg  in the evening   Yes [provider]  lisinopril (PRINIVIL,ZESTRIL) 40 MG tablet Take 40 mg by mouth daily.   Yes [provider]  meloxicam (MOBIC) 7.5 MG tablet Take 7.5 mg by mouth daily.   Yes [provider]  meloxicam (MOBIC) 7.5 MG tablet Take 1 tablet (7.5 mg total) by mouth daily. 04/18/18  Yes Sable Feil, PA-C  metFORMIN (GLUCOPHAGE) 1000 MG  tablet Take 1,000 mg by mouth 2 (two) times daily with a meal.   Yes [provider]  metoprolol succinate (TOPROL-XL) 100 MG 24 hr tablet Take 50-100 mg by mouth 2 (two) times daily. Take 1 tablet in the morning and one-half tablet in the evening. Take with or immediately following a meal.   Yes [provider]  minoxidil (LONITEN) 10 MG tablet Take 10 mg by mouth daily.   Yes [provider]  montelukast (SINGULAIR) 10 MG tablet Take 10 mg by mouth daily as needed (allergies).  06/16/15  Yes [provider]  Multiple Vitamin (MULTIVITAMIN WITH MINERALS) TABS tablet Take 1 tablet by mouth daily.   Yes [provider]  omeprazole (PRILOSEC) 20 MG capsule Take 20 mg by mouth daily as needed (heartburn).    Yes [provider]  polyethylene glycol powder (GLYCOLAX/MIRALAX) powder Take 17 g by mouth daily.    Yes [provider]  potassium chloride SA (K-DUR,KLOR-CON) 20 MEQ tablet Take 40 mEq by  mouth 2 (two) times daily.   Yes [provider]  spironolactone (ALDACTONE) 25 MG tablet Take 25 mg by mouth daily. 06/20/20  Yes [provider]  tiZANidine (ZANAFLEX) 4 MG capsule Take 4 mg by mouth 2 (two) times daily as needed for muscle spasms.    Yes [provider]  traMADol (ULTRAM) 50 MG tablet Take 50 mg by mouth 3 (three) times daily as needed for moderate pain.    Yes [provider]  traMADol (ULTRAM) 50 MG tablet Take 1 tablet (50 mg total) by mouth every 12 (twelve) hours as needed. 04/18/18  Yes Sable Feil, PA-C    Physical Exam: Vitals:   07/15/20 2058 07/15/20 2130 07/15/20 2157 07/15/20 2158  BP:  (!) 100/41    Pulse:  64    Resp:  19    Temp:      TempSrc:      SpO2: 98% 96% (!) 87% 92%  Weight:      Height:       Physical Exam Constitutional:      General: She is not in acute distress.    Appearance: Normal appearance. She is obese.  HENT:     Head: Normocephalic and  atraumatic.     Mouth/Throat:     Mouth: Mucous membranes are dry.     Pharynx: Oropharynx is clear.  Eyes:     Extraocular Movements: Extraocular movements intact.     Pupils: Pupils are equal, round, and reactive to light.  Cardiovascular:     Rate and Rhythm: Normal rate and regular rhythm.     Pulses: Normal pulses.     Heart sounds: Normal heart sounds.  Pulmonary:     Effort: Pulmonary effort is normal. No respiratory distress.     Breath sounds: Rales present.  Abdominal:     General: Bowel sounds are normal. There is no distension.     Palpations: Abdomen is soft.     Tenderness: There is no abdominal tenderness.  Musculoskeletal:        General: No swelling or deformity.  Skin:    General: Skin is warm and dry.  Neurological:     General: No focal deficit present.     Mental Status: Mental status is at baseline.    Labs on Admission: I have personally reviewed following labs and imaging studies  CBC: Recent Labs  Lab 07/15/20 1906  WBC 3.5*  NEUTROABS 2.7  HGB 9.7*  HCT 31.9*  MCV 89.1  PLT 409    Basic Metabolic Panel: Recent Labs  Lab 07/15/20 1906  NA 134*  K 4.6  CL 100  CO2 20*  GLUCOSE 54*  BUN 35*  CREATININE 2.18*  CALCIUM 8.5*    GFR: Estimated Creatinine Clearance: 27 mL/min (A) (by C-G formula based on SCr of 2.18 mg/dL (H)).  Liver Function Tests: Recent Labs  Lab 07/15/20 1906  AST 28  ALT 14  ALKPHOS 63  BILITOT 0.6  PROT 8.4*  ALBUMIN 4.1    Urine analysis:    Component Value Date/Time   COLORURINE YELLOW (A) 09/07/2015 0741   APPEARANCEUR CLEAR (A) 09/07/2015 0741   APPEARANCEUR Clear 01/24/2014 1752   LABSPEC 1.015 09/07/2015 0741   LABSPEC 1.014 01/24/2014 1752   PHURINE 6.0 09/07/2015 0741   GLUCOSEU 150 (A) 09/07/2015 0741   GLUCOSEU Negative 01/24/2014 1752   HGBUR NEGATIVE 09/07/2015 0741   BILIRUBINUR NEGATIVE 09/07/2015 0741   BILIRUBINUR Negative 01/24/2014 1752   KETONESUR NEGATIVE  09/07/2015 0741    PROTEINUR 30 (A) 09/07/2015 0741   NITRITE NEGATIVE 09/07/2015 0741   LEUKOCYTESUR TRACE (A) 09/07/2015 0741   LEUKOCYTESUR Negative 01/24/2014 1752    Radiological Exams on Admission: DG Chest Portable 1 View  Result Date: 07/15/2020 CLINICAL DATA:  Cough COVID EXAM: PORTABLE CHEST 1 VIEW COMPARISON:  03/19/2017 FINDINGS: Mild hazy airspace disease within the bilateral lungs. Borderline to mild cardiomegaly. Aortic atherosclerosis. No pleural effusion or pneumothorax. Surgical hardware in the cervical spine. IMPRESSION: Mild hazy airspace disease within the bilateral lungs, probably representing bilateral pneumonia. There is mild cardiomegaly. Electronically Signed   By: Donavan Foil M.D.   On: 07/15/2020 21:03    EKG: Independently reviewed.  Possible junctional rhythm versus low amplitude P waves at 66 bpm.  Some T wave flattening in lateral leads.  Baseline wander.  Assessment/Plan Principal Problem:   Pneumonia due to COVID-19 virus Active Problems:   Acute respiratory failure with hypoxia (HCC)   AKI (acute kidney injury) (HCC)   HTN (hypertension)   A-fib (HCC)   Chronic pain syndrome   Depression   Iron deficiency   Diabetes (HCC)   GERD (gastroesophageal reflux disease)   HLD (hyperlipidemia)   OSA (obstructive sleep apnea)  Pneumonia due to COVID-19 Acute hypoxic respiratory failure >  Symptoms for 2 weeks of worsening last couple days.  Sinus congestion, mild cough, mild shortness of breath.  Also with fever weakness and diarrhea.   > Requiring 2-3 L to maintain saturations, bilateral opacities on chest x-ray > Does typically use 2 L at night due to her OSA, but not during the day > Desaturated to the 80s on room air per note from urgent care. - Continue Decadron - Continue remdesivir - Pulmonary hygiene - As needed cough suppressant - Zinc, vitamin C - As needed inhaler - Daily CBC, CMP, CRP, D-dimer, ferritin, mag, Phos  AKI > Creatinine elevated 2.18  from baseline of typically around 1. > In the setting of diarrhea and home diuresis > Received 1 L of IV fluids in the ED - Avoid nephrotoxic agents - Hold diuretics and lisinopril - Trend renal function & electrolytes  A. Fib Hypertension - Continue home diltiazem, metoprolol, Eliquis - Hold home spironolactone, Lasix, lisinopril in the setting of AKI and soft blood pressure  Chronic pain syndrome Depression - Continue home tramadol  - Continue home duloxetine - Hold home NSAIDs in the setting of AKI and Covid infection  Iron deficiency anemia > Hemoglobin 9.7, previous baseline in her system was 10.7 > Borderline microcytic with MCV of 89 > Reports darker stools - Check FOBT - Recheck iron studies - Trend CBC  Diabetes > Hypoglycemic to the 40s to 66s in ED and urgent care - Recheck CBG after snack and Juice - SSI for now  GERD - Continue PPI  Hyperlipidemia - Continue home atorvastatin  OSA - On 2 L home oxygen at night  DVT prophylaxis: Eliquis  Code Status:   Full  Family Communication:  Attempted to contact patient's significant other Russell at [phone] but there was no answer. Disposition Plan:   Patient is from:  Home  Anticipated DC to:  Home  Anticipated DC date:  1 to 5 days  Anticipated DC barriers: None  Consults called:  None  Admission status:  Observation, MedSurg with telemetry   Severity of Illness: The appropriate patient status for this patient is OBSERVATION. Observation status is judged to be reasonable and necessary in order to provide the  required intensity of service to ensure the patient's safety. The patient's presenting symptoms, physical exam findings, and initial radiographic and laboratory data in the context of their medical condition is felt to place them at decreased risk for further clinical deterioration. Furthermore, it is anticipated that the patient will be medically stable for discharge from the hospital within 2  midnights of admission. The following factors support the patient status of observation.   " The patient's presenting symptoms include shortness of breath, sinus congestion, mild cough, back pain, fever, weakness, diarrhea. " The physical exam findings include rales. " The initial radiographic and laboratory data are concerning for mild hazy bilateral airspace disease, requiring 2 to 3 L of oxygen.  Hemoglobin 9.7, leukopenia, Covid positive, elevated inflammatory markers.  Creatinine 2.18 from baseline of 1.Marcelyn Bruins MD [facility]  How to contact the [facility] Attending or Consulting provider [place] or covering provider during after hours [place], for this patient?   1. Check the care team in [facility] and look for a) attending/consulting [facility] provider listed and b) the [facility] team listed 2. Log into [email or web address] and use [facility] universal password to access. If you do not have the password, please contact the hospital operator. 3. Locate the [facility] provider you are looking for under [facility] and page to a number that you can be directly reached. 4. If you still have difficulty reaching the provider, please page the [facility] (Director on Call) for the Hospitalists listed on amion for assistance.  07/15/2020, 10:42 PM

## 2020-07-15 NOTE — ED Triage Notes (Signed)
Pt arrives EMS from urgent care where she went for c/o cough, shortness of breath and congestion. Urgent care gave her 1 G of Tylenol for a fever and swabbed her for COVID which came back positive. Pts room air sat was in the low 80s. Pt placed on 3 L [place] with sats in the 96%.

## 2020-07-15 NOTE — ED Notes (Signed)
Pt provided with apple juice and graham crackers for glucose of 42. MD aware and RN to recheck in 30 min

## 2020-07-15 NOTE — Progress Notes (Signed)
Remdesivir - Pharmacy Brief Note   O:  ALT: 14 CXR: pending SpO2: 95% on 3 L [place]   A/P:  Remdesivir 200 mg IVPB once followed by 100 mg IVPB daily x 4 days.   Dorena Bodo, PharmD 07/15/2020 8:44 PM

## 2020-07-15 NOTE — ED Notes (Signed)
Pt provided cell phone and charger brought by daughter

## 2020-07-15 NOTE — ED Provider Notes (Signed)
[facility]    CSN: [id number] Arrival date & time: 07/15/20  1808      History   Chief Complaint Chief Complaint  Patient presents with  . Nasal Congestion  . Back Pain    upper   HPI  70 year old female presents with respiratory symptoms.  Patient reports that she has been sick for the past 3 weeks.  She has had several sick contacts.  Has been has tested positive for COVID-19.  Patient states that she took a home Covid test approximately 1 week ago and it was negative.  She reports ongoing sinus issues.  She reports sinus congestion.  Has developed cough and some shortness of breath as well.  Upper back pain.  Has had ongoing fever.  Patient currently febrile at 101.3.  Currently shivering.  She has taken some Coricidin with improvement but no resolution.   Past Medical History:  Diagnosis Date  . Chronic pain syndrome   . Depression   . Diabetes mellitus without complication ([place])   . GERD (gastroesophageal reflux disease)   . History of hiatal hernia   . Hypertension   . Iron deficiency anemia   . Microalbuminuric diabetic nephropathy ([place])   . Mitral regurgitation   . Obesity   . Shortness of breath dyspnea   . Sleep apnea     Patient Active Problem List   Diagnosis Date Noted  . Chest pain 09/07/2015    Past Surgical History:  Procedure Laterality Date  . ABDOMINAL HYSTERECTOMY    . BACK SURGERY    . BREAST BIOPSY Right 03/2003  . CARDIAC CATHETERIZATION N/A 10/10/2014   Procedure: Left Heart Cath;  Surgeon: Isaias Cowman, MD;  Location: [place] [facility];  Service: Cardiovascular;  Laterality: N/A;  . CARDIAC CATHETERIZATION    . carpal tunnell release  10/23/11  . COLONOSCOPY WITH PROPOFOL N/A 02/20/2015   Procedure: COLONOSCOPY WITH PROPOFOL;  Surgeon: Hulen Luster, MD;  Location: [facility] ENDOSCOPY;  Service: Gastroenterology;  Laterality: N/A;  . COLONOSCOPY WITH PROPOFOL N/A 04/26/2017   Procedure: COLONOSCOPY WITH PROPOFOL;  Surgeon:  Toledo, Benay Pike, MD;  Location: [facility] ENDOSCOPY;  Service: Gastroenterology;  Laterality: N/A;  . ESOPHAGOGASTRODUODENOSCOPY (EGD) WITH PROPOFOL N/A 02/20/2015   Procedure: ESOPHAGOGASTRODUODENOSCOPY (EGD) WITH PROPOFOL;  Surgeon: Hulen Luster, MD;  Location: [facility] ENDOSCOPY;  Service: Gastroenterology;  Laterality: N/A;  . ESOPHAGOGASTRODUODENOSCOPY (EGD) WITH PROPOFOL N/A 04/26/2017   Procedure: ESOPHAGOGASTRODUODENOSCOPY (EGD) WITH PROPOFOL;  Surgeon: Toledo, Benay Pike, MD;  Location: [facility] ENDOSCOPY;  Service: Gastroenterology;  Laterality: N/A;  . HERNIA REPAIR    . LAPAROSCOPIC OVARIAN CYSTECTOMY  08/2001  . nissen fundoplycation  11/99  . NOSE SURGERY      OB History   No obstetric history on file.      Home Medications    Prior to Admission medications   Medication Sig Start Date End Date Taking? Authorizing Provider  acetaminophen (TYLENOL) 650 MG CR tablet Take 650 mg by mouth every 8 (eight) hours as needed for pain.   Yes [provider]  amLODipine (NORVASC) 10 MG tablet Take 10 mg by mouth daily.   Yes [provider]  apixaban (ELIQUIS) 5 MG TABS tablet Take 1 tablet (5 mg total) by mouth 2 (two) times daily. 09/07/15  Yes Sudini, Alveta Heimlich, MD  atorvastatin (LIPITOR) 40 MG tablet Take 40 mg by mouth daily.   Yes [provider]  cetirizine (ZYRTEC) 10 MG tablet Take 10 mg by mouth daily.   Yes  [provider]  DULoxetine (CYMBALTA) 30 MG capsule Take 30 mg by mouth daily.   Yes [provider]  fluticasone (FLONASE) 50 MCG/ACT nasal spray Place 2 sprays into both nostrils daily.   Yes [provider]  furosemide (LASIX) 80 MG tablet Take 80 mg by mouth 2 (two) times daily.   Yes [provider]  glimepiride (AMARYL) 2 MG tablet Take 2-4 mg by mouth 2 (two) times daily. Take 4mg  in the morning and 2mg  in the evening   Yes [provider]  lisinopril (PRINIVIL,ZESTRIL) 40 MG tablet Take 40 mg by mouth daily.    Yes [provider]  metFORMIN (GLUCOPHAGE) 1000 MG tablet Take 1,000 mg by mouth 2 (two) times daily with a meal.   Yes [provider]  metoprolol succinate (TOPROL-XL) 100 MG 24 hr tablet Take 50-100 mg by mouth 2 (two) times daily. Take 1 tablet in the morning and one-half tablet in the evening. Take with or immediately following a meal.   Yes [provider]  minoxidil (LONITEN) 10 MG tablet Take 10 mg by mouth daily.   Yes [provider]  montelukast (SINGULAIR) 10 MG tablet Take 10 mg by mouth daily as needed (allergies).  06/16/15  Yes [provider]  potassium chloride SA (K-DUR,KLOR-CON) 20 MEQ tablet Take 40 mEq by mouth 2 (two) times daily.   Yes [provider]  spironolactone (ALDACTONE) 25 MG tablet Take 25 mg by mouth daily. 06/20/20  Yes [provider]  tiZANidine (ZANAFLEX) 4 MG capsule Take 4 mg by mouth 2 (two) times daily as needed for muscle spasms.    Yes [provider]  traMADol (ULTRAM) 50 MG tablet Take 50 mg by mouth 3 (three) times daily as needed for moderate pain.    Yes [provider]  traMADol (ULTRAM) 50 MG tablet Take 1 tablet (50 mg total) by mouth every 12 (twelve) hours as needed. 04/18/18  Yes Sable Feil, PA-C  aspirin EC 81 MG tablet Take 81 mg by mouth daily.    [provider]  diltiazem (CARDIZEM CD) 360 MG 24 hr capsule Take 360 mg by mouth daily. 05/30/20   [provider]  meloxicam (MOBIC) 7.5 MG tablet Take 7.5 mg by mouth daily.    [provider]  meloxicam (MOBIC) 7.5 MG tablet Take 1 tablet (7.5 mg total) by mouth daily. 04/18/18   Sable Feil, PA-C  Multiple Vitamin (MULTIVITAMIN WITH MINERALS) TABS tablet Take 1 tablet by mouth daily.    [provider]  omeprazole (PRILOSEC) 20 MG capsule Take 20 mg by mouth daily as needed (heartburn).     [provider]  polyethylene glycol powder (GLYCOLAX/MIRALAX) powder  Take 17 g by mouth daily.     [provider]  warfarin (COUMADIN) 5 MG tablet  03/06/20   [provider]    Family History Family History  Problem Relation Age of Onset  . Heart attack Mother   . Hypertension Father   . Stroke Father   . Heart attack Brother     Social History Social History   Tobacco Use  . Smoking status: Never Smoker  . Smokeless tobacco: Current User    Types: Snuff  Vaping Use  . Vaping Use: Never used  Substance Use Topics  . Alcohol use: No  . Drug use: No     Allergies   Bupropion, Gabapentin, Pioglitazone, Vicodin [hydrocodone-acetaminophen], and Vicoprofen [hydrocodone-ibuprofen]   Review of  Systems Review of Systems Per HPI  Physical Exam Triage Vital Signs ED Triage Vitals  Enc Vitals Group     BP 07/15/20 1828 (!) 144/67     Pulse Rate 07/15/20 1828 70     Resp 07/15/20 1828 18     Temp 07/15/20 1828 (!) 101.3 F (38.5 C)     Temp Source 07/15/20 1828 Oral     SpO2 07/15/20 1828 95 %     Weight 07/15/20 1822 245 lb (111.1 kg)     Height 07/15/20 1822 5' (1.524 m)     Head Circumference --      Peak Flow --      Pain Score 07/15/20 1822 8     Pain Loc --      Pain Edu? --      Excl. in [place]? --    Updated Vital Signs BP (!) 144/67 (BP Location: Left Arm)   Pulse 70   Temp (!) 101.3 F (38.5 C) (Oral)   Resp 18   Ht 5' (1.524 m)   Wt 111.1 kg   SpO2 95%   BMI 47.85 kg/m   Visual Acuity Right Eye Distance:   Left Eye Distance:   Bilateral Distance:    Right Eye Near:   Left Eye Near:    Bilateral Near:     Physical Exam Constitutional:      Appearance: She is ill-appearing.     Comments: Shivering.  HENT:     Head: Normocephalic and atraumatic.  Eyes:     General:        Right eye: No discharge.        Left eye: No discharge.     Conjunctiva/sclera: Conjunctivae normal.  Cardiovascular:     Rate and Rhythm: Normal rate and regular rhythm.  Pulmonary:     Effort: Pulmonary effort is  normal. No respiratory distress.  Neurological:     Mental Status: She is alert.  Psychiatric:        Mood and Affect: Mood normal.        Behavior: Behavior normal.    UC Treatments / Results  Labs (all labs ordered are listed, but only abnormal results are displayed) Labs Reviewed  RESP PANEL BY RT-PCR (FLU A&B, COVID) ARPGX2 - Abnormal; Notable for the following components:      Result Value   SARS Coronavirus 2 by RT PCR POSITIVE (*)    All other components within normal limits  CBC WITH DIFFERENTIAL/PLATELET - Abnormal; Notable for the following components:   WBC 3.5 (*)    RBC 3.58 (*)    Hemoglobin 9.7 (*)    HCT 31.9 (*)    Lymphs Abs 0.6 (*)    All other components within normal limits  COMPREHENSIVE METABOLIC PANEL - Abnormal; Notable for the following components:   Sodium 134 (*)    CO2 20 (*)    Glucose, Bld 54 (*)    BUN 35 (*)    Creatinine, Ser 2.18 (*)    Calcium 8.5 (*)    Total Protein 8.4 (*)    GFR, Estimated 24 (*)    All other components within normal limits    EKG   Radiology No results found.  Procedures Procedures (including critical care time)  Medications Ordered in UC Medications  acetaminophen (TYLENOL) tablet 1,000 mg (1,000 mg Oral Given 07/15/20 1918)    Initial Impression / Assessment and Plan / UC Course  I have reviewed the triage vital signs and  the nursing notes.  Pertinent labs & imaging results that were available during my care of the patient were reviewed by me and considered in my medical decision making (see chart for details).    70 year old female presents with acute respiratory failure with hypoxia.  Initially pulse ox was 95.  Upon EMS arrival, oxygen saturation 86.  Placed on nasal cannula oxygen.  Covid positive.  Suspect that she has Pneumonia as well.  Laboratory studies revealed leukopenia and new acute kidney injury with a creatinine of 2.18.  Last available creatinine was in October 2021 and was 1.1.  Patient  is ill-appearing and is in need of higher level of care.  Patient is very ill and will need hospital admission.  Final Clinical Impressions(s) / UC Diagnoses   Final diagnoses:  Acute respiratory failure with hypoxia ([place])  COVID  AKI (acute kidney injury) [facility])   Discharge Instructions   None    ED Prescriptions    None     PDMP not reviewed this encounter.   Coral Spikes, [place] 07/15/20 2029

## 2020-07-15 NOTE — ED Triage Notes (Signed)
Pt c/o sinus congestion over the past 2 weeks, slight cough and possibly shob, along with upper back pain across her shoulder blades. Pt also reports fevers up to 101. Pt denies n/v/d. Pt has taken Coricidin with some improvement in symptoms.

## 2020-07-15 NOTE — ED Provider Notes (Addendum)
[facility] Emergency Department Provider Note  Time seen: 8:42 PM  I have reviewed the triage vital signs and the nursing notes.   Cindy  Chief Complaint Shortness of Breath   HPI ELSE [name] is a 70 y.o. female with a past medical Cindy of chronic pain, depression, diabetes, gastric reflux, hypertension, obesity, sleep apnea wears 2 L at night, presents to the emergency department for reported several weeks of feeling weak, over the past several days she has had sinus congestion has developed a cough with shortness of breath as well as diarrhea.  Patient went to urgent care earlier today was found to be febrile 101.3.  Patient's lab work showed renal insufficiency and tested positive for Covid today.  Here patient arrives via EMS awake alert oriented.  Patient has a room air saturation in the low 80s, currently satting in the mid 90s on 3 L nasal cannula.  Denies any chest pain or abdominal pain.  No vomiting but does state occasional diarrhea.   Past Medical Cindy:  Diagnosis Date  . Chronic pain syndrome   . Depression   . Diabetes mellitus without complication ([place])   . GERD (gastroesophageal reflux disease)   . Cindy of hiatal hernia   . Hypertension   . Iron deficiency anemia   . Microalbuminuric diabetic nephropathy ([place])   . Mitral regurgitation   . Obesity   . Shortness of breath dyspnea   . Sleep apnea     Patient Active Problem List   Diagnosis Date Noted  . Chest pain 09/07/2015    Past Surgical Cindy:  Procedure Laterality Date  . ABDOMINAL HYSTERECTOMY    . BACK SURGERY    . BREAST BIOPSY Right 03/2003  . CARDIAC CATHETERIZATION N/A 10/10/2014   Procedure: Left Heart Cath;  Surgeon: Isaias Cowman, MD;  Location: [place] [facility];  Service: Cardiovascular;  Laterality: N/A;  . CARDIAC CATHETERIZATION    . carpal tunnell release  10/23/11  . COLONOSCOPY WITH PROPOFOL N/A 02/20/2015   Procedure: COLONOSCOPY WITH  PROPOFOL;  Surgeon: Hulen Luster, MD;  Location: [facility] ENDOSCOPY;  Service: Gastroenterology;  Laterality: N/A;  . COLONOSCOPY WITH PROPOFOL N/A 04/26/2017   Procedure: COLONOSCOPY WITH PROPOFOL;  Surgeon: Toledo, Benay Pike, MD;  Location: [facility] ENDOSCOPY;  Service: Gastroenterology;  Laterality: N/A;  . ESOPHAGOGASTRODUODENOSCOPY (EGD) WITH PROPOFOL N/A 02/20/2015   Procedure: ESOPHAGOGASTRODUODENOSCOPY (EGD) WITH PROPOFOL;  Surgeon: Hulen Luster, MD;  Location: [facility] ENDOSCOPY;  Service: Gastroenterology;  Laterality: N/A;  . ESOPHAGOGASTRODUODENOSCOPY (EGD) WITH PROPOFOL N/A 04/26/2017   Procedure: ESOPHAGOGASTRODUODENOSCOPY (EGD) WITH PROPOFOL;  Surgeon: Toledo, Benay Pike, MD;  Location: [facility] ENDOSCOPY;  Service: Gastroenterology;  Laterality: N/A;  . HERNIA REPAIR    . LAPAROSCOPIC OVARIAN CYSTECTOMY  08/2001  . nissen fundoplycation  11/99  . NOSE SURGERY      Prior to Admission medications   Medication Sig Start Date End Date Taking? Authorizing Provider  acetaminophen (TYLENOL) 650 MG CR tablet Take 650 mg by mouth every 8 (eight) hours as needed for pain.    [provider]  amLODipine (NORVASC) 10 MG tablet Take 10 mg by mouth daily.    [provider]  apixaban (ELIQUIS) 5 MG TABS tablet Take 1 tablet (5 mg total) by mouth 2 (two) times daily. 09/07/15   Hillary Bow, MD  aspirin EC 81 MG tablet Take 81 mg by mouth daily.    [provider]  atorvastatin (LIPITOR) 40 MG tablet Take 40 mg by  mouth daily.    [provider]  cetirizine (ZYRTEC) 10 MG tablet Take 10 mg by mouth daily.    [provider]  diltiazem (CARDIZEM CD) 360 MG 24 hr capsule Take 360 mg by mouth daily. 05/30/20   [provider]  DULoxetine (CYMBALTA) 30 MG capsule Take 30 mg by mouth daily.    [provider]  fluticasone (FLONASE) 50 MCG/ACT nasal spray Place 2 sprays into both nostrils daily.    [provider]  furosemide (LASIX) 80 MG tablet Take  80 mg by mouth 2 (two) times daily.    [provider]  glimepiride (AMARYL) 2 MG tablet Take 2-4 mg by mouth 2 (two) times daily. Take 4mg  in the morning and 2mg  in the evening    [provider]  lisinopril (PRINIVIL,ZESTRIL) 40 MG tablet Take 40 mg by mouth daily.    [provider]  meloxicam (MOBIC) 7.5 MG tablet Take 7.5 mg by mouth daily.    [provider]  meloxicam (MOBIC) 7.5 MG tablet Take 1 tablet (7.5 mg total) by mouth daily. 04/18/18   Sable Feil, PA-C  metFORMIN (GLUCOPHAGE) 1000 MG tablet Take 1,000 mg by mouth 2 (two) times daily with a meal.    [provider]  metoprolol succinate (TOPROL-XL) 100 MG 24 hr tablet Take 50-100 mg by mouth 2 (two) times daily. Take 1 tablet in the morning and one-half tablet in the evening. Take with or immediately following a meal.    [provider]  minoxidil (LONITEN) 10 MG tablet Take 10 mg by mouth daily.    [provider]  montelukast (SINGULAIR) 10 MG tablet Take 10 mg by mouth daily as needed (allergies).  06/16/15   [provider]  Multiple Vitamin (MULTIVITAMIN WITH MINERALS) TABS tablet Take 1 tablet by mouth daily.    [provider]  omeprazole (PRILOSEC) 20 MG capsule Take 20 mg by mouth daily as needed (heartburn).     [provider]  polyethylene glycol powder (GLYCOLAX/MIRALAX) powder Take 17 g by mouth daily.     [provider]  potassium chloride SA (K-DUR,KLOR-CON) 20 MEQ tablet Take 40 mEq by mouth 2 (two) times daily.    [provider]  spironolactone (ALDACTONE) 25 MG tablet Take 25 mg by mouth daily. 06/20/20   [provider]  tiZANidine (ZANAFLEX) 4 MG capsule Take 4 mg by mouth 2 (two) times daily as needed for muscle spasms.     [provider]  traMADol (ULTRAM) 50 MG tablet Take 50 mg by mouth 3 (three) times daily as needed for moderate pain.     [provider]  traMADol  (ULTRAM) 50 MG tablet Take 1 tablet (50 mg total) by mouth every 12 (twelve) hours as needed. 04/18/18   Sable Feil, PA-C  warfarin (COUMADIN) 5 MG tablet  03/06/20   [provider]    Allergies  Allergen Reactions  . Bupropion Other (See Comments)  . Gabapentin Palpitations  . Pioglitazone Palpitations  . Vicodin [Hydrocodone-Acetaminophen] Other (See Comments)    Reaction: racing heart  . Vicoprofen [Hydrocodone-Ibuprofen] Other (See Comments)    Reaction: racing heart    Family Cindy  Problem Relation Age of Onset  . Heart attack Mother   . Hypertension Father   . Stroke Father   . Heart attack Brother     Social Cindy Social Cindy   Tobacco Use  . Smoking status: Never Smoker  . Smokeless  tobacco: Current User    Types: Snuff  Vaping Use  . Vaping Use: Never used  Substance Use Topics  . Alcohol use: No  . Drug use: No    Review of Systems Constitutional: Found to be febrile at urgent care.  Positive for generalized fatigue. ENT: Positive for sinus congestion. Cardiovascular: Negative for chest pain. Respiratory: Positive shortness of breath worse with exertion.  Positive for cough. Gastrointestinal: Negative for abdominal pain, vomiting and occasional loose stool. Genitourinary: Negative for urinary compaints Musculoskeletal: Negative for musculoskeletal complaints Neurological: Negative for headache All other ROS negative  ____________________________________________  Constitutional: Alert and oriented. Well appearing and in no distress. Eyes: Normal exam ENT      Head: Normocephalic and atraumatic.      Mouth/Throat: Mucous membranes are moist. Cardiovascular: Normal rate, regular rhythm. No murmur Respiratory: Normal respiratory effort without tachypnea nor retractions. Breath sounds are clear Gastrointestinal: Soft and nontender. No distention.   Musculoskeletal: Nontender with normal range of motion in all extremities.   Neurologic:  Normal speech and language. No gross focal neurologic deficits  Skin:  Skin is warm, dry and intact.  Psychiatric: Mood and affect are normal.   ____________________________________________   RADIOLOGY  Bilateral hazy opacities  EKG viewed and interpreted by myself shows what appears to be a sinus rhythm at 66 bpm with a widened QRS, left axis deviation, largely normal intervals with nonspecific ST changes.  ____________________________________________   INITIAL IMPRESSION / ASSESSMENT AND PLAN / ED COURSE  Pertinent labs & imaging results that were available during my care of the patient were reviewed by me and considered in my medical decision making (see chart for details).   Patient presents emergency department from urgent care.  Patient states for the past 2 weeks or so she has been feeling weak and fatigued over the past several days has developed a cough and sinus congestion found to be febrile at urgent care and hypoxic.  Patient tested positive for Covid at urgent care.  Lab work shows renal insufficiency compared to her baseline creatinine has over doubled.  We will IV hydrate the patient.  We will check inflammatory markers, chest x-ray.  Given the patient's hypoxia and Covid positive status we will dose IV Decadron and remdesivir.  Patient will require admission to the hospital service for further work-up and treatment.  Inflammatory markers are elevated as expected.  Chest x-ray shows mild bilateral opacities.  Patient will be admitted to the hospitalist service.  Patient receiving IV fluids Decadron and remdesivir.  Patient's blood glucose was low on her chemistry urgent care we will recheck a CBG.  UDELL BLASINGAME was evaluated in Emergency Department on 07/15/2020 for the symptoms described in the Cindy of present illness. She was evaluated in the context of the global COVID-19 pandemic, which necessitated consideration that the patient might be at risk for  infection with the SARS-CoV-2 virus that causes COVID-19. Institutional protocols and algorithms that pertain to the evaluation of patients at risk for COVID-19 are in a state of rapid change based on information released by regulatory bodies including the CDC and federal and state organizations. These policies and algorithms were followed during the patient's care in the ED.  CRITICAL CARE Performed by: Harvest Dark   Total critical care time: 30 minutes  Critical care time was exclusive of separately billable procedures and treating other patients.  Critical care was necessary to treat or prevent imminent or life-threatening deterioration.  Critical care was time spent personally  by me on the following activities: development of treatment plan with patient and/or surrogate as well as nursing, discussions with consultants, evaluation of patient's response to treatment, examination of patient, obtaining Cindy from patient or surrogate, ordering and performing treatments and interventions, ordering and review of laboratory studies, ordering and review of radiographic studies, pulse oximetry and re-evaluation of patient's condition.  ____________________________________________   FINAL CLINICAL IMPRESSION(S) / ED DIAGNOSES  Hypoxemic respiratory failure secondary to COVID-19   Harvest Dark, MD 07/15/20 2215    Harvest Dark, MD 07/15/20 [phone]

## 2020-07-15 NOTE — ED Notes (Signed)
Patient is being discharged from the Urgent Care and sent to the Emergency Department via EMS . Per Dr. Lacinda Axon, patient is in need of higher level of care due to hypoxia, covid + and needing a higher level of care. Patient is aware and verbalizes understanding of plan of care.  Vitals:   07/15/20 1828  BP: (!) 144/67  Pulse: 70  Resp: 18  Temp: (!) 101.3 F (38.5 C)  SpO2: 95%

## 2020-07-16 DIAGNOSIS — Z7901 Long term (current) use of anticoagulants: Secondary | ICD-10-CM | POA: Diagnosis not present

## 2020-07-16 DIAGNOSIS — E1121 Type 2 diabetes mellitus with diabetic nephropathy: Secondary | ICD-10-CM | POA: Diagnosis present

## 2020-07-16 DIAGNOSIS — N179 Acute kidney failure, unspecified: Secondary | ICD-10-CM | POA: Diagnosis present

## 2020-07-16 DIAGNOSIS — F32A Depression, unspecified: Secondary | ICD-10-CM | POA: Diagnosis present

## 2020-07-16 DIAGNOSIS — E11649 Type 2 diabetes mellitus with hypoglycemia without coma: Secondary | ICD-10-CM | POA: Diagnosis present

## 2020-07-16 DIAGNOSIS — Z79899 Other long term (current) drug therapy: Secondary | ICD-10-CM | POA: Diagnosis not present

## 2020-07-16 DIAGNOSIS — Z7982 Long term (current) use of aspirin: Secondary | ICD-10-CM | POA: Diagnosis not present

## 2020-07-16 DIAGNOSIS — E669 Obesity, unspecified: Secondary | ICD-10-CM | POA: Diagnosis present

## 2020-07-16 DIAGNOSIS — U071 COVID-19: Secondary | ICD-10-CM | POA: Diagnosis present

## 2020-07-16 DIAGNOSIS — Z8249 Family history of ischemic heart disease and other diseases of the circulatory system: Secondary | ICD-10-CM | POA: Diagnosis not present

## 2020-07-16 DIAGNOSIS — E875 Hyperkalemia: Secondary | ICD-10-CM | POA: Diagnosis present

## 2020-07-16 DIAGNOSIS — K219 Gastro-esophageal reflux disease without esophagitis: Secondary | ICD-10-CM | POA: Diagnosis present

## 2020-07-16 DIAGNOSIS — J1282 Pneumonia due to coronavirus disease 2019: Secondary | ICD-10-CM | POA: Diagnosis present

## 2020-07-16 DIAGNOSIS — G894 Chronic pain syndrome: Secondary | ICD-10-CM | POA: Diagnosis present

## 2020-07-16 DIAGNOSIS — I10 Essential (primary) hypertension: Secondary | ICD-10-CM | POA: Diagnosis present

## 2020-07-16 DIAGNOSIS — Z6841 Body Mass Index (BMI) 40.0 and over, adult: Secondary | ICD-10-CM | POA: Diagnosis not present

## 2020-07-16 DIAGNOSIS — Z888 Allergy status to other drugs, medicaments and biological substances status: Secondary | ICD-10-CM | POA: Diagnosis not present

## 2020-07-16 DIAGNOSIS — I480 Paroxysmal atrial fibrillation: Secondary | ICD-10-CM | POA: Diagnosis present

## 2020-07-16 DIAGNOSIS — G4733 Obstructive sleep apnea (adult) (pediatric): Secondary | ICD-10-CM | POA: Diagnosis present

## 2020-07-16 DIAGNOSIS — Z720 Tobacco use: Secondary | ICD-10-CM | POA: Diagnosis not present

## 2020-07-16 DIAGNOSIS — Z823 Family history of stroke: Secondary | ICD-10-CM | POA: Diagnosis not present

## 2020-07-16 DIAGNOSIS — D509 Iron deficiency anemia, unspecified: Secondary | ICD-10-CM | POA: Diagnosis present

## 2020-07-16 DIAGNOSIS — J9601 Acute respiratory failure with hypoxia: Secondary | ICD-10-CM | POA: Diagnosis present

## 2020-07-16 DIAGNOSIS — R0603 Acute respiratory distress: Secondary | ICD-10-CM | POA: Diagnosis present

## 2020-07-16 DIAGNOSIS — E785 Hyperlipidemia, unspecified: Secondary | ICD-10-CM | POA: Diagnosis present

## 2020-07-16 LAB — CBC WITH DIFFERENTIAL/PLATELET
Abs Immature Granulocytes: 0.01 10*3/uL (ref 0.00–0.07)
Basophils Absolute: 0 10*3/uL (ref 0.0–0.1)
Basophils Relative: 0 %
Eosinophils Absolute: 0 10*3/uL (ref 0.0–0.5)
Eosinophils Relative: 0 %
HCT: 27.3 % — ABNORMAL LOW (ref 36.0–46.0)
Hemoglobin: 8.6 g/dL — ABNORMAL LOW (ref 12.0–15.0)
Immature Granulocytes: 0 %
Lymphocytes Relative: 10 %
Lymphs Abs: 0.3 10*3/uL — ABNORMAL LOW (ref 0.7–4.0)
MCH: 27.8 pg (ref 26.0–34.0)
MCHC: 31.5 g/dL (ref 30.0–36.0)
MCV: 88.3 fL (ref 80.0–100.0)
Monocytes Absolute: 0.1 10*3/uL (ref 0.1–1.0)
Monocytes Relative: 3 %
Neutro Abs: 2.6 10*3/uL (ref 1.7–7.7)
Neutrophils Relative %: 87 %
Platelets: 177 10*3/uL (ref 150–400)
RBC: 3.09 MIL/uL — ABNORMAL LOW (ref 3.87–5.11)
RDW: 14.1 % (ref 11.5–15.5)
WBC: 3 10*3/uL — ABNORMAL LOW (ref 4.0–10.5)
nRBC: 0 % (ref 0.0–0.2)

## 2020-07-16 LAB — GLUCOSE, CAPILLARY
Glucose-Capillary: 359 mg/dL — ABNORMAL HIGH (ref 70–99)
Glucose-Capillary: 387 mg/dL — ABNORMAL HIGH (ref 70–99)
Glucose-Capillary: 353 mg/dL — ABNORMAL HIGH (ref 70–99)

## 2020-07-16 LAB — COMPREHENSIVE METABOLIC PANEL
ALT: 14 U/L (ref 0–44)
AST: 27 U/L (ref 15–41)
Albumin: 3.4 g/dL — ABNORMAL LOW (ref 3.5–5.0)
Alkaline Phosphatase: 57 U/L (ref 38–126)
Anion gap: 9 (ref 5–15)
BUN: 34 mg/dL — ABNORMAL HIGH (ref 8–23)
CO2: 21 mmol/L — ABNORMAL LOW (ref 22–32)
Calcium: 7.9 mg/dL — ABNORMAL LOW (ref 8.9–10.3)
Chloride: 104 mmol/L (ref 98–111)
Creatinine, Ser: 1.84 mg/dL — ABNORMAL HIGH (ref 0.44–1.00)
GFR, Estimated: 29 mL/min — ABNORMAL LOW (ref >60)
Glucose, Bld: 256 mg/dL — ABNORMAL HIGH (ref 70–99)
Potassium: 5.8 mmol/L — ABNORMAL HIGH (ref 3.5–5.1)
Sodium: 134 mmol/L — ABNORMAL LOW (ref 135–145)
Total Bilirubin: 0.5 mg/dL (ref 0.3–1.2)
Total Protein: 7.1 g/dL (ref 6.5–8.1)

## 2020-07-16 LAB — FERRITIN: Ferritin: 148 ng/mL (ref 11–307)

## 2020-07-16 LAB — MAGNESIUM: Magnesium: 2 mg/dL (ref 1.7–2.4)

## 2020-07-16 LAB — D-DIMER, QUANTITATIVE: D-Dimer, Quant: 0.47 ug/mL-FEU (ref 0.00–0.50)

## 2020-07-16 LAB — HIV ANTIBODY (ROUTINE TESTING W REFLEX): HIV Screen 4th Generation wRfx: NONREACTIVE

## 2020-07-16 LAB — BRAIN NATRIURETIC PEPTIDE: B Natriuretic Peptide: 60.9 pg/mL (ref 0.0–100.0)

## 2020-07-16 LAB — CBG MONITORING, ED: Glucose-Capillary: 293 mg/dL — ABNORMAL HIGH (ref 70–99)

## 2020-07-16 LAB — C-REACTIVE PROTEIN: CRP: 2.6 mg/dL — ABNORMAL HIGH (ref <1.0)

## 2020-07-16 LAB — PHOSPHORUS: Phosphorus: 4 mg/dL (ref 2.5–4.6)

## 2020-07-16 MED ORDER — FERROUS SULFATE 325 (65 FE) MG PO TABS
325.0000 mg | ORAL_TABLET | Freq: Two times a day (BID) | ORAL | Status: DC
Start: 2020-07-16 — End: 2020-07-18
  Administered 2020-07-16 – 2020-07-18 (×4): 325 mg via ORAL
  Filled 2020-07-16 (×4): qty 1

## 2020-07-16 MED ORDER — INSULIN DETEMIR 100 UNIT/ML ~~LOC~~ SOLN
8.0000 [IU] | Freq: Every day | SUBCUTANEOUS | Status: DC
  Filled 2020-07-16: qty 0.08

## 2020-07-16 MED ORDER — SENNOSIDES-DOCUSATE SODIUM 8.6-50 MG PO TABS: 1.0000 | ORAL_TABLET | Freq: Every evening | ORAL | Status: DC | PRN

## 2020-07-16 MED ORDER — SENNA 8.6 MG PO TABS: 2.0000 | ORAL_TABLET | Freq: Two times a day (BID) | ORAL | Status: DC | PRN

## 2020-07-16 MED ORDER — DM-GUAIFENESIN ER 30-600 MG PO TB12: 1.0000 | ORAL_TABLET | Freq: Two times a day (BID) | ORAL | Status: DC | PRN

## 2020-07-16 MED ORDER — ALBUTEROL SULFATE HFA 108 (90 BASE) MCG/ACT IN AERS
1.0000 | INHALATION_SPRAY | Freq: Four times a day (QID) | RESPIRATORY_TRACT | Status: DC | PRN
  Filled 2020-07-16: qty 6.7

## 2020-07-16 MED ORDER — METOPROLOL TARTRATE 5 MG/5ML IV SOLN: 5.0000 mg | INTRAVENOUS | Status: DC | PRN

## 2020-07-16 MED ORDER — HYDRALAZINE HCL 20 MG/ML IJ SOLN: 10.0000 mg | INTRAMUSCULAR | Status: DC | PRN

## 2020-07-16 MED ORDER — INSULIN DETEMIR 100 UNIT/ML ~~LOC~~ SOLN
8.0000 [IU] | Freq: Two times a day (BID) | SUBCUTANEOUS | Status: DC
  Administered 2020-07-16 (×2): 8 [IU] via SUBCUTANEOUS
  Filled 2020-07-16 (×4): qty 0.08

## 2020-07-16 MED ORDER — SODIUM ZIRCONIUM CYCLOSILICATE 10 G PO PACK
10.0000 g | PACK | Freq: Once | ORAL | Status: AC
  Administered 2020-07-16: 10 g via ORAL
  Filled 2020-07-16: qty 1

## 2020-07-16 MED ORDER — LIVING WELL WITH DIABETES BOOK
Freq: Once | Status: AC
  Filled 2020-07-16: qty 1

## 2020-07-16 MED ORDER — IPRATROPIUM-ALBUTEROL 20-100 MCG/ACT IN AERS
1.0000 | INHALATION_SPRAY | Freq: Four times a day (QID) | RESPIRATORY_TRACT | Status: DC
  Administered 2020-07-16 – 2020-07-18 (×8): 1 via RESPIRATORY_TRACT
  Filled 2020-07-16 (×2): qty 4

## 2020-07-16 NOTE — Progress Notes (Signed)
PROGRESS NOTE    Cindy [name]  IHW:[id number] DOB: [birth year] DOA: 07/15/2020 PCP: Ezequiel Kayser, MD   Brief Narrative:  70 year old with past medical history of A. fib, chronic pain, depression, diabetes, GERD, HTN, HLD, iron deficiency anemia, OSA, diabetic nephropathy comes to the hospital with complaints of URI symptoms and fever.  Patient was diagnosed with COVID-19.  Started on steroids and remdesivir.  She was also noted to be in AKI with creatinine of 2.1, baseline 1.0.   Assessment & Plan:   Principal Problem:   Pneumonia due to COVID-19 virus Active Problems:   Acute respiratory failure with hypoxia (HCC)   AKI (acute kidney injury) ([place])   HTN (hypertension)   A-fib (HCC)   Chronic pain syndrome   Depression   Iron deficiency   Diabetes (HCC)   GERD (gastroesophageal reflux disease)   HLD (hyperlipidemia)   OSA (obstructive sleep apnea)   Acute hypoxic distress secondary to COVID-19 pneumonia -Oxygen requirement-3 L nasal cannula. Not vaccinated against.  -Chest x-ray-bilateral opacities  -Procalcitonin- -BNP-60.9 -Decadron-day 2 -Remdesivir-day 2 -Incentive spirometer, flutter valve and dilators -Trend inflammatory markers  Acute kidney injury, slowly improving -Elevated creatinine 2.1.  Baseline 1.0.  Creatinine this morning 1.8 -IV fluids.  Monitor urine output  Hyperkalemia -Potassium 5.8.  Continue to closely monitor.  Lokelma 10 mg oral once  History of paroxysmal atrial fibrillation -Continue Cardizem, metoprolol and Eliquis  History of essential hypertension -Continue Cardizem and metoprolol -Lasix, Aldactone and lisinopril on hold due to AKI  Iron deficiency anemia -Hemoglobin around baseline of 9.5. -FOBT ordered in the ER -Iron studies showing low iron saturation.  Continue iron supplements and bowel regimen  Diabetes mellitus type 2 with low blood glucose -Continue Accu-Cheks and sliding scale.  D5 fluids if necessary due to  hypoglycemia  GERD -PPI  Hyperlipidemia -Statin  Chronic pain with depression -Continue tramadol and duloxetine    DVT prophylaxis: Eliquis Code Status: Full code Family Communication:  Spoke with Daughter    Dispo: The patient is from: Home              Anticipated d/c is to: Home              Anticipated d/c date is: 2 days              Patient currently is not medically stable to d/c.   Difficult to place patient No    Body mass index is 47.26 kg/m.    Subjective: Short of breath on exertion. No complaints at rest.   Review of Systems Otherwise negative except as per HPI, including: General: Denies fever, chills, night sweats or unintended weight loss. Resp: Denies hemoptysis Cardiac: Denies chest pain, palpitations, orthopnea, paroxysmal nocturnal dyspnea. GI: Denies abdominal pain, nausea, vomiting, diarrhea or constipation GU: Denies dysuria, frequency, hesitancy or incontinence MS: Denies muscle aches, joint pain or swelling Neuro: Denies headache, neurologic deficits (focal weakness, numbness, tingling), abnormal gait Psych: Denies anxiety, depression, SI/HI/AVH Skin: Denies new rashes or lesions ID: Denies sick contacts, exotic exposures, travel  Examination:  General exam: Appears calm and comfortable; 3L Teller Respiratory system: b/l rhonchi Cardiovascular system: S1 & S2 heard, RRR. No JVD, murmurs, rubs, gallops or clicks. No pedal edema. Gastrointestinal system: Abdomen is nondistended, soft and nontender. No organomegaly or masses felt. Normal bowel sounds heard. Central nervous system: Alert and oriented. No focal neurological deficits. Extremities: Symmetric 5 x 5 power. Skin: No rashes, lesions or ulcers Psychiatry: Judgement and insight appear normal.  Mood & affect appropriate.     Objective: Vitals:   07/16/20 0711 07/16/20 0730 07/16/20 0830 07/16/20 0911  BP:  (!) 157/61 (!) 148/50 (!) 132/51  Pulse:  69 69 72  Resp:  (!) 29 (!) 0 13   Temp: 98.5 F (36.9 C)     TempSrc: Oral     SpO2:  95% 96% 99%  Weight:      Height:        Intake/Output Summary (Last 24 hours) at 07/16/2020 0927 Last data filed at 07/16/2020 0057 Gross per 24 hour  Intake -  Output 350 ml  Net -350 ml   Filed Weights   07/15/20 2046  Weight: 109.8 kg     Data Reviewed:   CBC: Recent Labs  Lab 07/15/20 1906 07/16/20 0403  WBC 3.5* 3.0*  NEUTROABS 2.7 2.6  HGB 9.7* 8.6*  HCT 31.9* 27.3*  MCV 89.1 88.3  PLT 205 419   Basic Metabolic Panel: Recent Labs  Lab 07/15/20 1906 07/16/20 0403  NA 134* 134*  K 4.6 5.8*  CL 100 104  CO2 20* 21*  GLUCOSE 54* 256*  BUN 35* 34*  CREATININE 2.18* 1.84*  CALCIUM 8.5* 7.9*  MG  --  2.0  PHOS  --  4.0   GFR: Estimated Creatinine Clearance: 32 mL/min (A) (by C-G formula based on SCr of 1.84 mg/dL (H)). Liver Function Tests: Recent Labs  Lab 07/15/20 1906 07/16/20 0403  AST 28 27  ALT 14 14  ALKPHOS 63 57  BILITOT 0.6 0.5  PROT 8.4* 7.1  ALBUMIN 4.1 3.4*   No results for input(s): LIPASE, AMYLASE in the last 168 hours. No results for input(s): AMMONIA in the last 168 hours. Coagulation Profile: No results for input(s): INR, PROTIME in the last 168 hours. Cardiac Enzymes: No results for input(s): CKTOTAL, CKMB, CKMBINDEX, TROPONINI in the last 168 hours. BNP (last 3 results) No results for input(s): PROBNP in the last 8760 hours. HbA1C: No results for input(s): HGBA1C in the last 72 hours. CBG: Recent Labs  Lab 07/15/20 2215 07/15/20 2245 07/16/20 0757  GLUCAP 42* 77 293*   Lipid Profile: No results for input(s): CHOL, HDL, LDLCALC, TRIG, CHOLHDL, LDLDIRECT in the last 72 hours. Thyroid Function Tests: No results for input(s): TSH, T4TOTAL, FREET4, T3FREE, THYROIDAB in the last 72 hours. Anemia Panel: Recent Labs    07/15/20 2043 07/16/20 0403  FERRITIN 114 148  TIBC 385  --   IRON 12*  --    Sepsis Labs: No results for input(s): PROCALCITON, LATICACIDVEN  in the last 168 hours.  Recent Results (from the past 240 hour(s))  Resp Panel by RT-PCR (Flu A&B, Covid) Nasopharyngeal Swab     Status: Abnormal   Collection Time: 07/15/20  7:06 PM   Specimen: Nasopharyngeal Swab; Nasopharyngeal(NP) swabs in vial transport medium  Result Value Ref Range Status   SARS Coronavirus 2 by RT PCR POSITIVE (A) NEGATIVE Final    Comment: RESULT CALLED TO, READ BACK BY AND VERIFIED WITH: DR. COOK/2000PM July 15 2020/SFW (NOTE) SARS-CoV-2 target nucleic acids are DETECTED.  The SARS-CoV-2 RNA is generally detectable in upper respiratory specimens during the acute phase of infection. Positive results are indicative of the presence of the identified virus, but do not rule out bacterial infection or co-infection with other pathogens not detected by the test. Clinical correlation with patient history and other diagnostic information is necessary to determine patient infection status. The expected result is Negative.  Fact Sheet for  Patients: EntrepreneurPulse.com.au  Fact Sheet for Healthcare Providers: IncredibleEmployment.be  This test is not yet approved or cleared by the [place] FDA and  has been authorized for detection and/or diagnosis of SARS-CoV-2 by FDA under an Emergency Use Authorization (EUA).  This EUA will remain in effect (meaning this test can  be used) for the duration of  the COVID-19 declaration under Section 564(b)(1) of the Act, 21 U.S.C. section 360bbb-3(b)(1), unless the authorization is terminated or revoked sooner.     Influenza A by PCR NEGATIVE NEGATIVE Final   Influenza B by PCR NEGATIVE NEGATIVE Final    Comment: (NOTE) The Xpert Xpress SARS-CoV-2/FLU/RSV plus assay is intended as an aid in the diagnosis of influenza from Nasopharyngeal swab specimens and should not be used as a sole basis for treatment. Nasal washings and aspirates are unacceptable for Xpert Xpress  SARS-CoV-2/FLU/RSV testing.  Fact Sheet for Patients: EntrepreneurPulse.com.au  Fact Sheet for Healthcare Providers: IncredibleEmployment.be  This test is not yet approved or cleared by the [place] FDA and has been authorized for detection and/or diagnosis of SARS-CoV-2 by FDA under an Emergency Use Authorization (EUA). This EUA will remain in effect (meaning this test can be used) for the duration of the COVID-19 declaration under Section 564(b)(1) of the Act, 21 U.S.C. section 360bbb-3(b)(1), unless the authorization is terminated or revoked.  Performed at [facility], [address], [place], [place] [address]       Radiology Studies: DG Chest Portable 1 View  Result Date: 07/15/2020 CLINICAL DATA:  Cough COVID EXAM: PORTABLE CHEST 1 VIEW COMPARISON:  03/19/2017 FINDINGS: Mild hazy airspace disease within the bilateral lungs. Borderline to mild cardiomegaly. Aortic atherosclerosis. No pleural effusion or pneumothorax. Surgical hardware in the cervical spine. IMPRESSION: Mild hazy airspace disease within the bilateral lungs, probably representing bilateral pneumonia. There is mild cardiomegaly. Electronically Signed   By: Donavan Foil M.D.   On: 07/15/2020 21:03    Scheduled Meds: . apixaban  5 mg Oral BID  . vitamin C  500 mg Oral Daily  . atorvastatin  40 mg Oral Daily  . dexamethasone (DECADRON) injection  6 mg Intravenous Q24H  . diltiazem  360 mg Oral Daily  . DULoxetine  30 mg Oral Daily  . insulin aspart  0-9 Units Subcutaneous TID WC  . metoprolol succinate  100 mg Oral q morning  . metoprolol succinate  50 mg Oral QHS  . pantoprazole  40 mg Oral Daily  . sodium chloride flush  3 mL Intravenous Q12H  . zinc sulfate  220 mg Oral Daily   Continuous Infusions: . lactated ringers 100 mL/hr at 07/16/20 0441  . remdesivir 100 mg in NS 100 mL 100 mg (07/16/20 0901)     LOS: 0 days   Time spent= 35  mins    Ankit Arsenio Loader, MD [facility]  If 7PM-7AM, please contact night-coverage  07/16/2020, 9:27 AM

## 2020-07-16 NOTE — Progress Notes (Addendum)
Inpatient Diabetes Program Recommendations  AACE/ADA: New Consensus Statement on Inpatient Glycemic Control (2015)  Target Ranges:  Prepandial:   less than 140 mg/dL      Peak postprandial:   less than 180 mg/dL (1-2 hours)      Critically ill patients:  140 - 180 mg/dL   Lab Results  Component Value Date   GLUCAP 359 (H) 07/16/2020   HGBA1C 7.9 (H) 09/07/2015    Review of Glycemic Control Results for Cindy [name], Cindy [name] (MRN [id number]) as of 07/16/2020 10:56  Ref. Range 07/15/2020 22:15 07/15/2020 22:45 07/16/2020 07:57 07/16/2020 10:55  Glucose-Capillary Latest Ref Range: 70 - 99 mg/dL 42 (LL) 77 293 (H) 359 (H)   Diabetes history: DM2 Outpatient Diabetes medications: Amaryl 4 mg am + 2 mg pm + Metformin 1 gm bid Current orders for Inpatient glycemic control: Novolog 0-9 units tid  Inpatient Diabetes Program Recommendations:   -Add Levemir 8 units bid (0.15 units/kg x 109.8 kg) Secure chat to Dr. Reesa Chew. Spoke with husband via phone inquiring about pre hospital CBGs. Husband states patient said she was eating but he does not think she was eating and continued to take the Amaryl and Metformin. Noted GFR currently 29 so Metformin may need to be discontinued on discharge. Reviewed hypoglycemia protocol with husband and will attempt to speak with patient as well.  Thank you, Cindy [name]. Cindy Disbro, RN, MSN, CDE  Diabetes Coordinator Inpatient Glycemic Control Team Team Pager [phone] (8am-5pm) 07/16/2020 11:12 AM

## 2020-07-16 NOTE — Progress Notes (Signed)
PT Cancellation Note  Patient Details Name: Cindy [name] MRN: [id number] DOB: July 18, 1950   Cancelled Treatment:    Reason Eval/Treat Not Completed: Other (comment). Consult received and chart reviewed. Pt currently with elevated K+ at 5.8 this date, not appropriate for exertional activity. Will hold at this time and re-attempt next date, if appropriate.   Alyssa Mancera 07/16/2020, 1:09 PM  Greggory Stallion, PT, DPT [phone]

## 2020-07-16 NOTE — Plan of Care (Signed)
  Problem: Education: Goal: Knowledge of risk factors and measures for prevention of condition will improve Outcome: Progressing   Problem: Coping: Goal: Psychosocial and spiritual needs will be supported Outcome: Progressing   Problem: Respiratory: Goal: Will maintain a patent airway Outcome: Progressing Goal: Complications related to the disease process, condition or treatment will be avoided or minimized Outcome: Progressing   

## 2020-07-16 NOTE — Progress Notes (Signed)
OT Cancellation Note  Patient Details Name: Cindy [name] MRN: [id number] DOB: 27-Apr-1951   Cancelled Treatment:    Reason Eval/Treat Not Completed: Medical issues which prohibited therapy. Chart reviewed - pt noted to have K+ critically high at 5.8; contraindicated for exertional activity at this time. Will continue to follow and initiate services as pt medically appropriate to participate in therapy.   Dessie Coma, M.S. OTR/L  07/16/20, 1:45 PM  ascom [phone]

## 2020-07-17 ENCOUNTER — Encounter: Payer: Self-pay | Admitting: Internal Medicine

## 2020-07-17 LAB — CBC WITH DIFFERENTIAL/PLATELET
Abs Immature Granulocytes: 0.04 10*3/uL (ref 0.00–0.07)
Basophils Absolute: 0 10*3/uL (ref 0.0–0.1)
Basophils Relative: 0 %
Eosinophils Absolute: 0 10*3/uL (ref 0.0–0.5)
Eosinophils Relative: 0 %
HCT: 28.3 % — ABNORMAL LOW (ref 36.0–46.0)
Hemoglobin: 9.3 g/dL — ABNORMAL LOW (ref 12.0–15.0)
Immature Granulocytes: 1 %
Lymphocytes Relative: 11 %
Lymphs Abs: 0.5 10*3/uL — ABNORMAL LOW (ref 0.7–4.0)
MCH: 28.4 pg (ref 26.0–34.0)
MCHC: 32.9 g/dL (ref 30.0–36.0)
MCV: 86.3 fL (ref 80.0–100.0)
Monocytes Absolute: 0.3 10*3/uL (ref 0.1–1.0)
Monocytes Relative: 8 %
Neutro Abs: 3.2 10*3/uL (ref 1.7–7.7)
Neutrophils Relative %: 80 %
Platelets: 257 10*3/uL (ref 150–400)
RBC: 3.28 MIL/uL — ABNORMAL LOW (ref 3.87–5.11)
RDW: 13.8 % (ref 11.5–15.5)
Smear Review: NORMAL
WBC: 4 10*3/uL (ref 4.0–10.5)
nRBC: 0 % (ref 0.0–0.2)

## 2020-07-17 LAB — COMPREHENSIVE METABOLIC PANEL
ALT: 17 U/L (ref 0–44)
AST: 33 U/L (ref 15–41)
Albumin: 3.8 g/dL (ref 3.5–5.0)
Alkaline Phosphatase: 61 U/L (ref 38–126)
Anion gap: 11 (ref 5–15)
BUN: 35 mg/dL — ABNORMAL HIGH (ref 8–23)
CO2: 21 mmol/L — ABNORMAL LOW (ref 22–32)
Calcium: 8.9 mg/dL (ref 8.9–10.3)
Chloride: 102 mmol/L (ref 98–111)
Creatinine, Ser: 1.54 mg/dL — ABNORMAL HIGH (ref 0.44–1.00)
GFR, Estimated: 36 mL/min — ABNORMAL LOW (ref >60)
Glucose, Bld: 356 mg/dL — ABNORMAL HIGH (ref 70–99)
Potassium: 4.8 mmol/L (ref 3.5–5.1)
Sodium: 134 mmol/L — ABNORMAL LOW (ref 135–145)
Total Bilirubin: 0.2 mg/dL — ABNORMAL LOW (ref 0.3–1.2)
Total Protein: 7.8 g/dL (ref 6.5–8.1)

## 2020-07-17 LAB — PHOSPHORUS: Phosphorus: 3.7 mg/dL (ref 2.5–4.6)

## 2020-07-17 LAB — MAGNESIUM: Magnesium: 2.4 mg/dL (ref 1.7–2.4)

## 2020-07-17 LAB — GLUCOSE, CAPILLARY
Glucose-Capillary: 324 mg/dL — ABNORMAL HIGH (ref 70–99)
Glucose-Capillary: 384 mg/dL — ABNORMAL HIGH (ref 70–99)
Glucose-Capillary: 302 mg/dL — ABNORMAL HIGH (ref 70–99)
Glucose-Capillary: 258 mg/dL — ABNORMAL HIGH (ref 70–99)

## 2020-07-17 LAB — C-REACTIVE PROTEIN: CRP: 2.2 mg/dL — ABNORMAL HIGH (ref <1.0)

## 2020-07-17 LAB — D-DIMER, QUANTITATIVE: D-Dimer, Quant: 0.58 ug/mL-FEU — ABNORMAL HIGH (ref 0.00–0.50)

## 2020-07-17 LAB — FERRITIN: Ferritin: 196 ng/mL (ref 11–307)

## 2020-07-17 LAB — PROCALCITONIN: Procalcitonin: <0.1 ng/mL

## 2020-07-17 MED ORDER — INSULIN DETEMIR 100 UNIT/ML ~~LOC~~ SOLN
12.0000 [IU] | Freq: Two times a day (BID) | SUBCUTANEOUS | Status: DC
  Administered 2020-07-17 – 2020-07-18 (×3): 12 [IU] via SUBCUTANEOUS
  Filled 2020-07-17 (×4): qty 0.12

## 2020-07-17 NOTE — Progress Notes (Signed)
Update given to daughter.  

## 2020-07-17 NOTE — Progress Notes (Signed)
OT Cancellation Note  Patient Details Name: Cindy [name] MRN: [id number] DOB: 02/04/1951   Cancelled Treatment:    Reason Eval/Treat Not Completed: OT screened, no needs identified, will sign off. Order received, chart reviewed. Per conversation with PT, pt back to baseline functional independence. Has all DME and all education completed. No skilled OT needs identified. Will sign off. Please re-consult if additional needs arise.   Dessie Coma, M.S. OTR/L  07/17/20, 12:07 PM  ascom [phone]

## 2020-07-17 NOTE — Evaluation (Signed)
Physical Therapy Evaluation Patient Details Name: Cindy [name] MRN: [id number] DOB: 06/03/1950 Today's Date: 07/17/2020   History of Present Illness  Pt admitted for pnuemonia secondary to covid with complaints of SOB, cough, and congestion now dx with covid. History includes Afib, depression, DM, GERD, HTN, HLD, and obestity. Wears 2L of O2 at night.  Clinical Impression  Pt is a pleasant 70 year old female who was admitted for pneumonia secondary to covid. Pt performs transfers with independence and ambulation with supervision without device. BP at 129/80 prior to mobility efforts. Pt demonstrates all bed mobility/transfers/ambulation at baseline level. Pt received on O2 upon arrival, baseline of O2 at night time. Pt does not require any further PT needs at this time. Pt will be dc in house and does not require follow up. RN aware. Will dc current orders.  O2 sats: REST- 2L 97% REST- RA 96% EXERTION- RA 91% with slight SOB REST-post exertion RA 97%     Follow Up Recommendations No PT follow up    Equipment Recommendations  None recommended by PT    Recommendations for Other Services       Precautions / Restrictions Precautions Precautions: Fall Restrictions Weight Bearing Restrictions: No      Mobility  Bed Mobility               General bed mobility comments: not performed as received seated at EOB.    Transfers Overall transfer level: Independent Equipment used: None             General transfer comment: safe technique with upright posture  Ambulation/Gait Ambulation/Gait assistance: Supervision Gait Distance (Feet): 80 Feet Assistive device: None Gait Pattern/deviations: WFL(Within Functional Limits)     General Gait Details: ambulated several laps in room. Able to perform turns safely without LOB. All mobility performed on RA with O2 sat WNL. Slight SOB symptoms noted and fatigued- reports this is her baseline  Marine scientist [place] (Stroke Patients Only)       Balance Overall balance assessment: Independent                                           Pertinent Vitals/Pain Pain Assessment: No/denies pain    Home Living Family/patient expects to be discharged to:: Private residence Living Arrangements: Spouse/significant other Available Help at Discharge: Family Type of Home: House Home Access: Stairs to enter Entrance Stairs-Rails: None Entrance Stairs-Number of Steps: 3 Home Layout: One level Home Equipment: Clinical cytogeneticist - 2 wheels;Cane - single point;Grab bars - tub/shower;Hand held shower head      Prior Function Level of Independence: Independent         Comments: household ambulator without AD. Reports no recent falls.     Hand Dominance        Extremity/Trunk Assessment   Upper Extremity Assessment Upper Extremity Assessment: Overall WFL for tasks assessed    Lower Extremity Assessment Lower Extremity Assessment: Overall WFL for tasks assessed       Communication   Communication: No difficulties  Cognition Arousal/Alertness: Awake/alert Behavior During Therapy: WFL for tasks assessed/performed Overall Cognitive Status: Within Functional Limits for tasks assessed  General Comments      Exercises     Assessment/Plan    PT Assessment Patent does not need any further PT services  PT Problem List         PT Treatment Interventions      PT Goals (Current goals can be found in the Care Plan section)  Acute Rehab PT Goals Patient Stated Goal: to go home PT Goal Formulation: All assessment and education complete, DC therapy Time For Goal Achievement: 07/17/20 Potential to Achieve Goals: Good    Frequency     Barriers to discharge        Co-evaluation               AM-PAC PT "6 Clicks" Mobility  Outcome Measure Help needed turning from  your back to your side while in a flat bed without using bedrails?: None Help needed moving from lying on your back to sitting on the side of a flat bed without using bedrails?: None Help needed moving to and from a bed to a chair (including a wheelchair)?: None Help needed standing up from a chair using your arms (e.g., wheelchair or bedside chair)?: None Help needed to walk in hospital room?: None Help needed climbing 3-5 steps with a railing? : None 6 Click Score: 24    End of Session Equipment Utilized During Treatment: Oxygen Activity Tolerance: Patient tolerated treatment well Patient left:  (seated at EOB) Nurse Communication: Mobility status PT Visit Diagnosis: Muscle weakness (generalized) (M62.81)    Time: 8469-6295 PT Time Calculation (min) (ACUTE ONLY): 24 min   Charges:   PT Evaluation $PT Eval Low Complexity: 1 Low PT Treatments $Gait Training: 8-22 mins        Greggory Stallion, PT, DPT [phone]   Jennife Zaucha 07/17/2020, 1:17 PM

## 2020-07-17 NOTE — Progress Notes (Signed)
PROGRESS NOTE    Cindy [name]  BCW:[id number] DOB: 05/15/1951 DOA: 07/15/2020 PCP: Ezequiel Kayser, MD   Brief Narrative:  70 year old with past medical history of A. fib, chronic pain, depression, diabetes, GERD, HTN, HLD, iron deficiency anemia, OSA, diabetic nephropathy comes to the hospital with complaints of URI symptoms and fever.  Patient was diagnosed with COVID-19.  Started on steroids and remdesivir.  She was also noted to be in AKI with creatinine of 2.1, baseline 1.0.   Assessment & Plan:   Principal Problem:   Pneumonia due to COVID-19 virus Active Problems:   Acute respiratory failure with hypoxia (HCC)   AKI (acute kidney injury) ([place])   HTN (hypertension)   A-fib (HCC)   Chronic pain syndrome   Depression   Iron deficiency   Diabetes (HCC)   GERD (gastroesophageal reflux disease)   HLD (hyperlipidemia)   OSA (obstructive sleep apnea)   Acute respiratory distress   Acute hypoxic distress secondary to COVID-19 pneumonia;  Improving -Oxygen requirement-Now mostly on RA, requires 2L at night, but does have some exertional dyspnea. . Not vaccinated against.  -Chest x-ray-bilateral opacities  -Procalcitonin- Neg -BNP-60.9 -Decadron-day 3 -Remdesivir-day 3 -Incentive spirometer, flutter valve and dilators -Trend inflammatory markers  Acute kidney injury, slowly improving -Elevated creatinine 2.1.  Baseline 1.0.  Creatinine this morning 1.5 -IV fluids.  Monitor urine output  Hyperkalemia -Resolved s/p Lokelma 10 mg oral once  History of paroxysmal atrial fibrillation -Continue Cardizem, metoprolol and Eliquis  History of essential hypertension -Continue Cardizem and metoprolol -Lasix, Aldactone and lisinopril on hold due to AKI  Iron deficiency anemia -Hemoglobin around baseline of 9.5. -FOBT ordered in the ER -Iron studies showing low iron saturation.  Continue iron supplements and bowel regimen  Diabetes mellitus type 2 with low blood  glucose -Continue Accu-Cheks and sliding scale. Levemir increased to 12U BID  GERD -PPI  Hyperlipidemia -Statin  Chronic pain with depression -Continue tramadol and duloxetine   PT/OT eval pending.   DVT prophylaxis: Eliquis Code Status: Full code Family Communication:  Maia Petties, no answer today. Left her a voicemail    Dispo: The patient is from: Home              Anticipated d/c is to: Home              Anticipated d/c date is: 1 day              Patient currently is not medically stable to d/c.   Difficult to place patient No    Body mass index is 47.26 kg/m.    Subjective: Feels better today. O2 is better with ambulation but still has some dyspnea with exertion.   Review of Systems Otherwise negative except as per HPI, including: General = no fevers, chills, dizziness,  fatigue HEENT/EYES = negative for loss of vision, double vision, blurred vision,  sore throa Cardiovascular= negative for chest pain, palpitation Respiratory/lungs= negative for shortness of breath, cough, wheezing; hemoptysis,  Gastrointestinal= negative for nausea, vomiting, abdominal pain Genitourinary= negative for Dysuria MSK = Negative for arthralgia, myalgias Neurology= Negative for headache, numbness, tingling  Psychiatry= Negative for suicidal and homocidal ideation Skin= Negative for Rash   Examination:  Constitutional: Not in acute distress Respiratory: Clear to auscultation bilaterally Cardiovascular: Normal sinus rhythm, no rubs Abdomen: Nontender nondistended good bowel sounds Musculoskeletal: No edema noted Skin: No rashes seen Neurologic: CN 2-12 grossly intact.  And nonfocal Psychiatric: Normal judgment and insight. Alert and oriented x 3. Normal  mood.      Objective: Vitals:   07/16/20 2142 07/17/20 0036 07/17/20 0500 07/17/20 0745  BP: (!) 129/48 (!) 127/49 (!) 108/43 (!) 178/145  Pulse: 67 70 68 60  Resp:  (!) 22 20 16   Temp:  98.3 F (36.8 C) 98.6 F  (37 C) 97.8 F (36.6 C)  TempSrc:  Oral Oral   SpO2:  96% 93% 98%  Weight:      Height:        Intake/Output Summary (Last 24 hours) at 07/17/2020 0829 Last data filed at 07/16/2020 1900 Gross per 24 hour  Intake 100 ml  Output --  Net 100 ml   Filed Weights   07/15/20 2046  Weight: 109.8 kg     Data Reviewed:   CBC: Recent Labs  Lab 07/15/20 1906 07/16/20 0403 07/17/20 0646  WBC 3.5* 3.0* 4.0  NEUTROABS 2.7 2.6 PENDING  HGB 9.7* 8.6* 9.3*  HCT 31.9* 27.3* 28.3*  MCV 89.1 88.3 86.3  PLT 205 177 408   Basic Metabolic Panel: Recent Labs  Lab 07/15/20 1906 07/16/20 0403 07/17/20 0646  NA 134* 134* 134*  K 4.6 5.8* 4.8  CL 100 104 102  CO2 20* 21* 21*  GLUCOSE 54* 256* 356*  BUN 35* 34* 35*  CREATININE 2.18* 1.84* 1.54*  CALCIUM 8.5* 7.9* 8.9  MG  --  2.0 2.4  PHOS  --  4.0 3.7   GFR: Estimated Creatinine Clearance: 38.2 mL/min (A) (by C-G formula based on SCr of 1.54 mg/dL (H)). Liver Function Tests: Recent Labs  Lab 07/15/20 1906 07/16/20 0403 07/17/20 0646  AST 28 27 33  ALT 14 14 17   ALKPHOS 63 57 61  BILITOT 0.6 0.5 0.2*  PROT 8.4* 7.1 7.8  ALBUMIN 4.1 3.4* 3.8   No results for input(s): LIPASE, AMYLASE in the last 168 hours. No results for input(s): AMMONIA in the last 168 hours. Coagulation Profile: No results for input(s): INR, PROTIME in the last 168 hours. Cardiac Enzymes: No results for input(s): CKTOTAL, CKMB, CKMBINDEX, TROPONINI in the last 168 hours. BNP (last 3 results) No results for input(s): PROBNP in the last 8760 hours. HbA1C: No results for input(s): HGBA1C in the last 72 hours. CBG: Recent Labs  Lab 07/16/20 0757 07/16/20 1055 07/16/20 1612 07/16/20 2123 07/17/20 0758  GLUCAP 293* 359* 387* 353* 324*   Lipid Profile: No results for input(s): CHOL, HDL, LDLCALC, TRIG, CHOLHDL, LDLDIRECT in the last 72 hours. Thyroid Function Tests: No results for input(s): TSH, T4TOTAL, FREET4, T3FREE, THYROIDAB in the last 72  hours. Anemia Panel: Recent Labs    07/15/20 2043 07/16/20 0403 07/17/20 0646  FERRITIN 114 148 196  TIBC 385  --   --   IRON 12*  --   --    Sepsis Labs: Recent Labs  Lab 07/17/20 0646  PROCALCITON <0.10    Recent Results (from the past 240 hour(s))  Resp Panel by RT-PCR (Flu A&B, Covid) Nasopharyngeal Swab     Status: Abnormal   Collection Time: 07/15/20  7:06 PM   Specimen: Nasopharyngeal Swab; Nasopharyngeal(NP) swabs in vial transport medium  Result Value Ref Range Status   SARS Coronavirus 2 by RT PCR POSITIVE (A) NEGATIVE Final    Comment: RESULT CALLED TO, READ BACK BY AND VERIFIED WITH: DR. COOK/2000PM July 15 2020/SFW (NOTE) SARS-CoV-2 target nucleic acids are DETECTED.  The SARS-CoV-2 RNA is generally detectable in upper respiratory specimens during the acute phase of infection. Positive results are indicative of the  presence of the identified virus, but do not rule out bacterial infection or co-infection with other pathogens not detected by the test. Clinical correlation with patient history and other diagnostic information is necessary to determine patient infection status. The expected result is Negative.  Fact Sheet for Patients: EntrepreneurPulse.com.au  Fact Sheet for Healthcare Providers: IncredibleEmployment.be  This test is not yet approved or cleared by the [place] FDA and  has been authorized for detection and/or diagnosis of SARS-CoV-2 by FDA under an Emergency Use Authorization (EUA).  This EUA will remain in effect (meaning this test can  be used) for the duration of  the COVID-19 declaration under Section 564(b)(1) of the Act, 21 U.S.C. section 360bbb-3(b)(1), unless the authorization is terminated or revoked sooner.     Influenza A by PCR NEGATIVE NEGATIVE Final   Influenza B by PCR NEGATIVE NEGATIVE Final    Comment: (NOTE) The Xpert Xpress SARS-CoV-2/FLU/RSV plus assay is intended as an  aid in the diagnosis of influenza from Nasopharyngeal swab specimens and should not be used as a sole basis for treatment. Nasal washings and aspirates are unacceptable for Xpert Xpress SARS-CoV-2/FLU/RSV testing.  Fact Sheet for Patients: EntrepreneurPulse.com.au  Fact Sheet for Healthcare Providers: IncredibleEmployment.be  This test is not yet approved or cleared by the [place] FDA and has been authorized for detection and/or diagnosis of SARS-CoV-2 by FDA under an Emergency Use Authorization (EUA). This EUA will remain in effect (meaning this test can be used) for the duration of the COVID-19 declaration under Section 564(b)(1) of the Act, 21 U.S.C. section 360bbb-3(b)(1), unless the authorization is terminated or revoked.  Performed at [facility], [address], [place], [place] [address]       Radiology Studies: DG Chest Portable 1 View  Result Date: 07/15/2020 CLINICAL DATA:  Cough COVID EXAM: PORTABLE CHEST 1 VIEW COMPARISON:  03/19/2017 FINDINGS: Mild hazy airspace disease within the bilateral lungs. Borderline to mild cardiomegaly. Aortic atherosclerosis. No pleural effusion or pneumothorax. Surgical hardware in the cervical spine. IMPRESSION: Mild hazy airspace disease within the bilateral lungs, probably representing bilateral pneumonia. There is mild cardiomegaly. Electronically Signed   By: Donavan Foil M.D.   On: 07/15/2020 21:03    Scheduled Meds: . apixaban  5 mg Oral BID  . vitamin C  500 mg Oral Daily  . atorvastatin  40 mg Oral Daily  . dexamethasone (DECADRON) injection  6 mg Intravenous Q24H  . diltiazem  360 mg Oral Daily  . DULoxetine  30 mg Oral Daily  . ferrous sulfate  325 mg Oral BID WC  . insulin aspart  0-9 Units Subcutaneous TID WC  . insulin detemir  12 Units Subcutaneous BID  . Ipratropium-Albuterol  1 puff Inhalation Q6H  . metoprolol succinate  100 mg Oral q morning  . metoprolol  succinate  50 mg Oral QHS  . pantoprazole  40 mg Oral Daily  . sodium chloride flush  3 mL Intravenous Q12H  . zinc sulfate  220 mg Oral Daily   Continuous Infusions: . remdesivir 100 mg in NS 100 mL Stopped (07/16/20 0950)     LOS: 1 day   Time spent= 35 mins    Valerie Fredin Arsenio Loader, MD [facility]  If 7PM-7AM, please contact night-coverage  07/17/2020, 8:29 AM

## 2020-07-18 LAB — COMPREHENSIVE METABOLIC PANEL
ALT: 21 U/L (ref 0–44)
AST: 32 U/L (ref 15–41)
Albumin: 3.5 g/dL (ref 3.5–5.0)
Alkaline Phosphatase: 57 U/L (ref 38–126)
Anion gap: 9 (ref 5–15)
BUN: 33 mg/dL — ABNORMAL HIGH (ref 8–23)
CO2: 22 mmol/L (ref 22–32)
Calcium: 9.1 mg/dL (ref 8.9–10.3)
Chloride: 105 mmol/L (ref 98–111)
Creatinine, Ser: 1.33 mg/dL — ABNORMAL HIGH (ref 0.44–1.00)
GFR, Estimated: 43 mL/min — ABNORMAL LOW (ref >60)
Glucose, Bld: 229 mg/dL — ABNORMAL HIGH (ref 70–99)
Potassium: 4.8 mmol/L (ref 3.5–5.1)
Sodium: 136 mmol/L (ref 135–145)
Total Bilirubin: 0.4 mg/dL (ref 0.3–1.2)
Total Protein: 7.1 g/dL (ref 6.5–8.1)

## 2020-07-18 LAB — CBC WITH DIFFERENTIAL/PLATELET
Abs Immature Granulocytes: 0.15 10*3/uL — ABNORMAL HIGH (ref 0.00–0.07)
Basophils Absolute: 0 10*3/uL (ref 0.0–0.1)
Basophils Relative: 0 %
Eosinophils Absolute: 0 10*3/uL (ref 0.0–0.5)
Eosinophils Relative: 0 %
HCT: 26.6 % — ABNORMAL LOW (ref 36.0–46.0)
Hemoglobin: 8.9 g/dL — ABNORMAL LOW (ref 12.0–15.0)
Immature Granulocytes: 3 %
Lymphocytes Relative: 9 %
Lymphs Abs: 0.5 10*3/uL — ABNORMAL LOW (ref 0.7–4.0)
MCH: 28.5 pg (ref 26.0–34.0)
MCHC: 33.5 g/dL (ref 30.0–36.0)
MCV: 85.3 fL (ref 80.0–100.0)
Monocytes Absolute: 0.3 10*3/uL (ref 0.1–1.0)
Monocytes Relative: 6 %
Neutro Abs: 4 10*3/uL (ref 1.7–7.7)
Neutrophils Relative %: 82 %
Platelets: 246 10*3/uL (ref 150–400)
RBC: 3.12 MIL/uL — ABNORMAL LOW (ref 3.87–5.11)
RDW: 13.7 % (ref 11.5–15.5)
Smear Review: NORMAL
WBC: 4.9 10*3/uL (ref 4.0–10.5)
nRBC: 0 % (ref 0.0–0.2)

## 2020-07-18 LAB — C-REACTIVE PROTEIN: CRP: 1 mg/dL — ABNORMAL HIGH (ref <1.0)

## 2020-07-18 LAB — GLUCOSE, CAPILLARY
Glucose-Capillary: 196 mg/dL — ABNORMAL HIGH (ref 70–99)
Glucose-Capillary: 246 mg/dL — ABNORMAL HIGH (ref 70–99)

## 2020-07-18 LAB — PHOSPHORUS: Phosphorus: 3.2 mg/dL (ref 2.5–4.6)

## 2020-07-18 LAB — D-DIMER, QUANTITATIVE: D-Dimer, Quant: 0.48 ug/mL-FEU (ref 0.00–0.50)

## 2020-07-18 LAB — MAGNESIUM: Magnesium: 2.3 mg/dL (ref 1.7–2.4)

## 2020-07-18 LAB — FERRITIN: Ferritin: 188 ng/mL (ref 11–307)

## 2020-07-18 MED ORDER — FERROUS SULFATE 325 (65 FE) MG PO TABS
325.0000 mg | ORAL_TABLET | Freq: Two times a day (BID) | ORAL | 1 refills | Status: DC
Start: 2020-07-18 — End: 2022-06-25

## 2020-07-18 MED ORDER — ALBUTEROL SULFATE HFA 108 (90 BASE) MCG/ACT IN AERS
1.0000 | INHALATION_SPRAY | Freq: Four times a day (QID) | RESPIRATORY_TRACT | 1 refills | Status: DC | PRN
Start: 2020-07-18 — End: 2020-12-03

## 2020-07-18 MED ORDER — SENNA 8.6 MG PO TABS: 2.0000 | ORAL_TABLET | Freq: Two times a day (BID) | ORAL | 0 refills | Status: DC | PRN

## 2020-07-18 MED ORDER — DEXAMETHASONE 6 MG PO TABS: 6.0000 mg | ORAL_TABLET | Freq: Every day | ORAL | 0 refills | Status: AC

## 2020-07-18 NOTE — Progress Notes (Signed)
Pt being discharged home, discharge instructions reviewed with pt, states understanding, pt with no complaints, awaiting ride for transport 

## 2020-07-18 NOTE — Discharge Summary (Signed)
Physician Discharge Summary  Cindy [name] NIO:[id number] DOB: November 06, 1950 DOA: 07/15/2020  PCP: Ezequiel Kayser, MD  Admit date: 07/15/2020 Discharge date: 07/18/2020  Admitted From: Home Disposition: Home  Recommendations for Outpatient Follow-up:  1. Follow up with PCP in 1-2 weeks 2. Please obtain BMP/CBC in one week your next doctors visit.  3. Oral Decadron has been prescribed for 6 more days 4. Iron supplements with bowel regimen prescribed 5. As needed albuterol inhaler prescribed   Discharge Condition: Stable CODE STATUS: Full code Diet recommendation: Diabetic  Brief/Interim Summary: 69 year old with past medical history of A. fib, chronic pain, depression, diabetes, GERD, HTN, HLD, iron deficiency anemia, OSA, diabetic nephropathy comes to the hospital with complaints of URI symptoms and fever.  Patient was diagnosed with COVID-19.  Started on steroids and remdesivir.  She was also noted to be in AKI with creatinine of 2.1, baseline 1.0.  Stable today for discharge. Her daughter updated as well on the day of discharge.    Assessment & Plan:   Principal Problem:   Pneumonia due to COVID-19 virus Active Problems:   Acute respiratory failure with hypoxia (HCC)   AKI (acute kidney injury) ([place])   HTN (hypertension)   A-fib (HCC)   Chronic pain syndrome   Depression   Iron deficiency   Diabetes (HCC)   GERD (gastroesophageal reflux disease)   HLD (hyperlipidemia)   OSA (obstructive sleep apnea)   Acute respiratory distress   Acute hypoxic distress secondary to COVID-19 pneumonia;  Improving -Patient has been weaned down to room air.  She is not vaccinated against COVID-19 -Chest x-ray-bilateral opacities  -Procalcitonin- Neg -BNP-60.9 -Decadron-we will discharge her on 6 more days -Remdesivir-day 3/3 -Advised to take home her incentive spirometer and flutter valve -Albuterol inhaler prescribed  Acute kidney injury, slowly improving -Elevated creatinine  2.1.    Baseline creatinine 1.2.  Improved with IV fluids.  Today is 1.3.  Advised to get repeat lab work when she follows with her PCP in 1-2 weeks  Hyperkalemia -Resolved  History of paroxysmal atrial fibrillation -Continue Cardizem, metoprolol and Eliquis  History of essential hypertension -Resume her home medications.  Iron deficiency anemia -Hemoglobin around baseline of 9.5. -Iron studies showing low iron saturation.  Continue iron supplements and bowel regimen  Diabetes mellitus type 2 with low blood glucose -Continue Accu-Cheks and sliding scale. Levemir increased to 12U BID  GERD -PPI  Hyperlipidemia -Statin  Chronic pain with depression -Continue tramadol and duloxetine  Patient worked with physical therapy, she did well.  Did not have any home needs.  Her oxygen saturations remained stable with ambulation on room air   Body mass index is 47.87 kg/m.       Discharge Diagnoses:  Principal Problem:   Pneumonia due to COVID-19 virus Active Problems:   Acute respiratory failure with hypoxia (HCC)   AKI (acute kidney injury) ([place])   HTN (hypertension)   A-fib (HCC)   Chronic pain syndrome   Depression   Iron deficiency   Diabetes (HCC)   GERD (gastroesophageal reflux disease)   HLD (hyperlipidemia)   OSA (obstructive sleep apnea)   Acute respiratory distress   Subjective: Feels great, no complaints. Wants to go home.   Daughter updated.   Discharge Exam: Vitals:   07/18/20 0027 07/18/20 0559  BP: 103/84 (!) 140/50  Pulse: (!) 56 60  Resp: 16 16  Temp: 97.7 F (36.5 C) (!) 97.5 F (36.4 C)  SpO2: 94% (!) 89%   Vitals:   07/17/20  1944 07/18/20 0027 07/18/20 0500 07/18/20 0559  BP: (!) 122/52 103/84  (!) 140/50  Pulse: (!) 59 (!) 56  60  Resp: 18 16  16   Temp: 98.2 F (36.8 C) 97.7 F (36.5 C)  (!) 97.5 F (36.4 C)  TempSrc: Oral Oral    SpO2: 96% 94%  (!) 89%  Weight:   111.2 kg   Height:        General: Pt is alert,  awake, not in acute distress Cardiovascular: RRR, S1/S2 +, no rubs, no gallops Respiratory: CTA bilaterally, no wheezing, no rhonchi Abdominal: Soft, NT, ND, bowel sounds + Extremities: no edema, no cyanosis  Discharge Instructions   Allergies as of 07/18/2020      Reactions   Bupropion Other (See Comments)   Gabapentin Palpitations   Pioglitazone Palpitations   Vicodin [hydrocodone-acetaminophen] Other (See Comments)   Reaction: racing heart   Vicoprofen [hydrocodone-ibuprofen] Other (See Comments)   Reaction: racing heart      Medication List    TAKE these medications   acetaminophen 650 MG CR tablet Commonly known as: TYLENOL Take 650 mg by mouth every 8 (eight) hours as needed for pain.   albuterol 108 (90 Base) MCG/ACT inhaler Commonly known as: VENTOLIN HFA Inhale 1-2 puffs into the lungs every 6 (six) hours as needed for wheezing or shortness of breath.   amLODipine 10 MG tablet Commonly known as: NORVASC Take 10 mg by mouth daily.   apixaban 5 MG Tabs tablet Commonly known as: ELIQUIS Take 1 tablet (5 mg total) by mouth 2 (two) times daily.   aspirin EC 81 MG tablet Take 81 mg by mouth daily.   atorvastatin 40 MG tablet Commonly known as: LIPITOR Take 40 mg by mouth daily.   cetirizine 10 MG tablet Commonly known as: ZYRTEC Take 10 mg by mouth daily.   dexamethasone 6 MG tablet Commonly known as: DECADRON Take 1 tablet (6 mg total) by mouth daily for 6 days.   diltiazem 360 MG 24 hr capsule Commonly known as: CARDIZEM CD Take 360 mg by mouth daily.   DULoxetine 30 MG capsule Commonly known as: CYMBALTA Take 30 mg by mouth daily.   ferrous sulfate 325 (65 FE) MG tablet Take 1 tablet (325 mg total) by mouth 2 (two) times daily with a meal.   fluticasone 50 MCG/ACT nasal spray Commonly known as: FLONASE Place 2 sprays into both nostrils daily.   furosemide 80 MG tablet Commonly known as: LASIX Take 80 mg by mouth 2 (two) times daily.    glimepiride 2 MG tablet Commonly known as: AMARYL Take 2-4 mg by mouth 2 (two) times daily. Take 4mg  in the morning and 2mg  in the evening   lisinopril 40 MG tablet Commonly known as: ZESTRIL Take 40 mg by mouth daily.   meloxicam 7.5 MG tablet Commonly known as: MOBIC Take 7.5 mg by mouth daily.   meloxicam 7.5 MG tablet Commonly known as: Mobic Take 1 tablet (7.5 mg total) by mouth daily.   metFORMIN 1000 MG tablet Commonly known as: GLUCOPHAGE Take 1,000 mg by mouth 2 (two) times daily with a meal.   metoprolol succinate 100 MG 24 hr tablet Commonly known as: TOPROL-XL Take 50-100 mg by mouth 2 (two) times daily. Take 1 tablet in the morning and one-half tablet in the evening. Take with or immediately following a meal.   minoxidil 10 MG tablet Commonly known as: LONITEN Take 10 mg by mouth daily.   montelukast 10 MG tablet  Commonly known as: SINGULAIR Take 10 mg by mouth daily as needed (allergies).   multivitamin with minerals Tabs tablet Take 1 tablet by mouth daily.   omeprazole 20 MG capsule Commonly known as: PRILOSEC Take 20 mg by mouth daily as needed (heartburn).   polyethylene glycol powder 17 GM/SCOOP powder Commonly known as: GLYCOLAX/MIRALAX Take 17 g by mouth daily.   potassium chloride SA 20 MEQ tablet Commonly known as: KLOR-CON Take 40 mEq by mouth 2 (two) times daily.   senna 8.6 MG Tabs tablet Commonly known as: SENOKOT Take 2 tablets (17.2 mg total) by mouth 2 (two) times daily as needed for mild constipation or moderate constipation.   spironolactone 25 MG tablet Commonly known as: ALDACTONE Take 25 mg by mouth daily.   tiZANidine 4 MG capsule Commonly known as: ZANAFLEX Take 4 mg by mouth 2 (two) times daily as needed for muscle spasms.   traMADol 50 MG tablet Commonly known as: ULTRAM Take 50 mg by mouth 3 (three) times daily as needed for moderate pain.   traMADol 50 MG tablet Commonly known as: Ultram Take 1 tablet (50 mg  total) by mouth every 12 (twelve) hours as needed.       Follow-up Information    Ezequiel Kayser, MD. Schedule an appointment as soon as possible for a visit in 2 week(s).   Specialty: Internal Medicine Contact information: [place] [address] [phone]              Allergies  Allergen Reactions  . Bupropion Other (See Comments)  . Gabapentin Palpitations  . Pioglitazone Palpitations  . Vicodin [Hydrocodone-Acetaminophen] Other (See Comments)    Reaction: racing heart  . Vicoprofen [Hydrocodone-Ibuprofen] Other (See Comments)    Reaction: racing heart    You were cared for by a hospitalist during your hospital stay. If you have any questions about your discharge medications or the care you received while you were in the hospital after you are discharged, you can call the unit and asked to speak with the hospitalist on call if the hospitalist that took care of you is not available. Once you are discharged, your primary care physician will handle any further medical issues. Please note that no refills for any discharge medications will be authorized once you are discharged, as it is imperative that you return to your primary care physician (or establish a relationship with a primary care physician if you do not have one) for your aftercare needs so that they can reassess your need for medications and monitor your lab values.   Procedures/Studies: DG Chest Portable 1 View  Result Date: 07/15/2020 CLINICAL DATA:  Cough COVID EXAM: PORTABLE CHEST 1 VIEW COMPARISON:  03/19/2017 FINDINGS: Mild hazy airspace disease within the bilateral lungs. Borderline to mild cardiomegaly. Aortic atherosclerosis. No pleural effusion or pneumothorax. Surgical hardware in the cervical spine. IMPRESSION: Mild hazy airspace disease within the bilateral lungs, probably representing bilateral pneumonia. There is mild cardiomegaly. Electronically Signed   By: Donavan Foil M.D.   On:  07/15/2020 21:03      The results of significant diagnostics from this hospitalization (including imaging, microbiology, ancillary and laboratory) are listed below for reference.     Microbiology: Recent Results (from the past 240 hour(s))  Resp Panel by RT-PCR (Flu A&B, Covid) Nasopharyngeal Swab     Status: Abnormal   Collection Time: 07/15/20  7:06 PM   Specimen: Nasopharyngeal Swab; Nasopharyngeal(NP) swabs in vial transport medium  Result Value Ref Range  Status   SARS Coronavirus 2 by RT PCR POSITIVE (A) NEGATIVE Final    Comment: RESULT CALLED TO, READ BACK BY AND VERIFIED WITH: DR. COOK/2000PM July 15 2020/SFW (NOTE) SARS-CoV-2 target nucleic acids are DETECTED.  The SARS-CoV-2 RNA is generally detectable in upper respiratory specimens during the acute phase of infection. Positive results are indicative of the presence of the identified virus, but do not rule out bacterial infection or co-infection with other pathogens not detected by the test. Clinical correlation with patient history and other diagnostic information is necessary to determine patient infection status. The expected result is Negative.  Fact Sheet for Patients: EntrepreneurPulse.com.au  Fact Sheet for Healthcare Providers: IncredibleEmployment.be  This test is not yet approved or cleared by the [place] FDA and  has been authorized for detection and/or diagnosis of SARS-CoV-2 by FDA under an Emergency Use Authorization (EUA).  This EUA will remain in effect (meaning this test can  be used) for the duration of  the COVID-19 declaration under Section 564(b)(1) of the Act, 21 U.S.C. section 360bbb-3(b)(1), unless the authorization is terminated or revoked sooner.     Influenza A by PCR NEGATIVE NEGATIVE Final   Influenza B by PCR NEGATIVE NEGATIVE Final    Comment: (NOTE) The Xpert Xpress SARS-CoV-2/FLU/RSV plus assay is intended as an aid in the diagnosis  of influenza from Nasopharyngeal swab specimens and should not be used as a sole basis for treatment. Nasal washings and aspirates are unacceptable for Xpert Xpress SARS-CoV-2/FLU/RSV testing.  Fact Sheet for Patients: EntrepreneurPulse.com.au  Fact Sheet for Healthcare Providers: IncredibleEmployment.be  This test is not yet approved or cleared by the [place] FDA and has been authorized for detection and/or diagnosis of SARS-CoV-2 by FDA under an Emergency Use Authorization (EUA). This EUA will remain in effect (meaning this test can be used) for the duration of the COVID-19 declaration under Section 564(b)(1) of the Act, 21 U.S.C. section 360bbb-3(b)(1), unless the authorization is terminated or revoked.  Performed at [facility], [address] [place], [place] [address]      Labs: BNP (last 3 results) Recent Labs    07/16/20 0403  BNP 10.9   Basic Metabolic Panel: Recent Labs  Lab 07/15/20 1906 07/16/20 0403 07/17/20 0646 07/18/20 0500  NA 134* 134* 134* 136  K 4.6 5.8* 4.8 4.8  CL 100 104 102 105  CO2 20* 21* 21* 22  GLUCOSE 54* 256* 356* 229*  BUN 35* 34* 35* 33*  CREATININE 2.18* 1.84* 1.54* 1.33*  CALCIUM 8.5* 7.9* 8.9 9.1  MG  --  2.0 2.4 2.3  PHOS  --  4.0 3.7 3.2   Liver Function Tests: Recent Labs  Lab 07/15/20 1906 07/16/20 0403 07/17/20 0646 07/18/20 0500  AST 28 27 33 32  ALT 14 14 17 21   ALKPHOS 63 57 61 57  BILITOT 0.6 0.5 0.2* 0.4  PROT 8.4* 7.1 7.8 7.1  ALBUMIN 4.1 3.4* 3.8 3.5   No results for input(s): LIPASE, AMYLASE in the last 168 hours. No results for input(s): AMMONIA in the last 168 hours. CBC: Recent Labs  Lab 07/15/20 1906 07/16/20 0403 07/17/20 0646 07/18/20 0500  WBC 3.5* 3.0* 4.0 4.9  NEUTROABS 2.7 2.6 3.2 4.0  HGB 9.7* 8.6* 9.3* 8.9*  HCT 31.9* 27.3* 28.3* 26.6*  MCV 89.1 88.3 86.3 85.3  PLT 205 177 257 246   Cardiac Enzymes: No results for input(s):  CKTOTAL, CKMB, CKMBINDEX, TROPONINI in the last 168 hours. BNP: Invalid input(s):  POCBNP CBG: Recent Labs  Lab 07/16/20 2123 07/17/20 0758 07/17/20 1147 07/17/20 1651 07/17/20 2102  GLUCAP 353* 324* 384* 302* 258*   D-Dimer Recent Labs    07/17/20 0646 07/18/20 0500  DDIMER 0.58* 0.48   Hgb A1c No results for input(s): HGBA1C in the last 72 hours. Lipid Profile No results for input(s): CHOL, HDL, LDLCALC, TRIG, CHOLHDL, LDLDIRECT in the last 72 hours. Thyroid function studies No results for input(s): TSH, T4TOTAL, T3FREE, THYROIDAB in the last 72 hours.  Invalid input(s): FREET3 Anemia work up [employer]    07/15/20 2043 07/16/20 0403 07/17/20 0646 07/18/20 0500  FERRITIN 114   < > 196 188  TIBC 385  --   --   --   IRON 12*  --   --   --    < > = values in this interval not displayed.   Urinalysis    Component Value Date/Time   COLORURINE YELLOW (A) 09/07/2015 0741   APPEARANCEUR CLEAR (A) 09/07/2015 0741   APPEARANCEUR Clear 01/24/2014 1752   LABSPEC 1.015 09/07/2015 0741   LABSPEC 1.014 01/24/2014 1752   PHURINE 6.0 09/07/2015 0741   GLUCOSEU 150 (A) 09/07/2015 0741   GLUCOSEU Negative 01/24/2014 1752   HGBUR NEGATIVE 09/07/2015 0741   BILIRUBINUR NEGATIVE 09/07/2015 0741   BILIRUBINUR Negative 01/24/2014 1752   KETONESUR NEGATIVE 09/07/2015 0741   PROTEINUR 30 (A) 09/07/2015 0741   NITRITE NEGATIVE 09/07/2015 0741   LEUKOCYTESUR TRACE (A) 09/07/2015 0741   LEUKOCYTESUR Negative 01/24/2014 1752   Sepsis Labs Invalid input(s): PROCALCITONIN,  WBC,  LACTICIDVEN Microbiology Recent Results (from the past 240 hour(s))  Resp Panel by RT-PCR (Flu A&B, Covid) Nasopharyngeal Swab     Status: Abnormal   Collection Time: 07/15/20  7:06 PM   Specimen: Nasopharyngeal Swab; Nasopharyngeal(NP) swabs in vial transport medium  Result Value Ref Range Status   SARS Coronavirus 2 by RT PCR POSITIVE (A) NEGATIVE Final    Comment: RESULT CALLED TO, READ BACK BY AND  VERIFIED WITH: DR. COOK/2000PM July 15 2020/SFW (NOTE) SARS-CoV-2 target nucleic acids are DETECTED.  The SARS-CoV-2 RNA is generally detectable in upper respiratory specimens during the acute phase of infection. Positive results are indicative of the presence of the identified virus, but do not rule out bacterial infection or co-infection with other pathogens not detected by the test. Clinical correlation with patient history and other diagnostic information is necessary to determine patient infection status. The expected result is Negative.  Fact Sheet for Patients: EntrepreneurPulse.com.au  Fact Sheet for Healthcare Providers: IncredibleEmployment.be  This test is not yet approved or cleared by the [place] FDA and  has been authorized for detection and/or diagnosis of SARS-CoV-2 by FDA under an Emergency Use Authorization (EUA).  This EUA will remain in effect (meaning this test can  be used) for the duration of  the COVID-19 declaration under Section 564(b)(1) of the Act, 21 U.S.C. section 360bbb-3(b)(1), unless the authorization is terminated or revoked sooner.     Influenza A by PCR NEGATIVE NEGATIVE Final   Influenza B by PCR NEGATIVE NEGATIVE Final    Comment: (NOTE) The Xpert Xpress SARS-CoV-2/FLU/RSV plus assay is intended as an aid in the diagnosis of influenza from Nasopharyngeal swab specimens and should not be used as a sole basis for treatment. Nasal washings and aspirates are unacceptable for Xpert Xpress SARS-CoV-2/FLU/RSV testing.  Fact Sheet for Patients: EntrepreneurPulse.com.au  Fact Sheet for Healthcare Providers: IncredibleEmployment.be  This test is not yet approved or cleared by  the [employer] and has been authorized for detection and/or diagnosis of SARS-CoV-2 by FDA under an Emergency Use Authorization (EUA). This EUA will remain in effect (meaning this test  can be used) for the duration of the COVID-19 declaration under Section 564(b)(1) of the Act, 21 U.S.C. section 360bbb-3(b)(1), unless the authorization is terminated or revoked.  Performed at [facility], [address] [place], [place] [address]      Time coordinating discharge:  I have spent 35 minutes face to face with the patient and on the ward discussing the patients care, assessment, plan and disposition with other care givers. >50% of the time was devoted counseling the patient about the risks and benefits of treatment/Discharge disposition and coordinating care.   SIGNED:   Damita Lack, MD  [facility] 07/18/2020, 7:59 AM   If 7PM-7AM, please contact night-coverage

## 2020-07-18 NOTE — Plan of Care (Signed)
  Problem: Education: Goal: Knowledge of risk factors and measures for prevention of condition will improve Outcome: Progressing   Problem: Coping: Goal: Psychosocial and spiritual needs will be supported Outcome: Progressing   Problem: Respiratory: Goal: Will maintain a patent airway Outcome: Progressing Goal: Complications related to the disease process, condition or treatment will be avoided or minimized Outcome: Progressing   

## 2020-07-18 NOTE — Care Management Important Message (Signed)
Important Message  Patient Details  Name: Cindy [name] MRN: [id number] Date of Birth: [birth year]   Medicare Important Message Given:  N/A - LOS <3 / Initial given by admissions     [name] 07/18/2020, 8:17 AM

## 2020-08-04 DIAGNOSIS — I129 Hypertensive chronic kidney disease with stage 1 through stage 4 chronic kidney disease, or unspecified chronic kidney disease: Secondary | ICD-10-CM | POA: Diagnosis not present

## 2020-08-04 DIAGNOSIS — Z7901 Long term (current) use of anticoagulants: Secondary | ICD-10-CM | POA: Diagnosis not present

## 2020-08-04 DIAGNOSIS — J309 Allergic rhinitis, unspecified: Secondary | ICD-10-CM | POA: Diagnosis not present

## 2020-08-04 DIAGNOSIS — D509 Iron deficiency anemia, unspecified: Secondary | ICD-10-CM | POA: Diagnosis not present

## 2020-08-04 DIAGNOSIS — I480 Paroxysmal atrial fibrillation: Secondary | ICD-10-CM | POA: Diagnosis not present

## 2020-08-04 DIAGNOSIS — D6869 Other thrombophilia: Secondary | ICD-10-CM | POA: Diagnosis not present

## 2020-08-04 DIAGNOSIS — E1129 Type 2 diabetes mellitus with other diabetic kidney complication: Secondary | ICD-10-CM | POA: Diagnosis not present

## 2020-08-04 DIAGNOSIS — G4733 Obstructive sleep apnea (adult) (pediatric): Secondary | ICD-10-CM | POA: Diagnosis not present

## 2020-08-04 DIAGNOSIS — M5136 Other intervertebral disc degeneration, lumbar region: Secondary | ICD-10-CM | POA: Diagnosis not present

## 2020-08-04 DIAGNOSIS — N183 Chronic kidney disease, stage 3 unspecified: Secondary | ICD-10-CM | POA: Diagnosis not present

## 2020-08-04 DIAGNOSIS — E1169 Type 2 diabetes mellitus with other specified complication: Secondary | ICD-10-CM | POA: Diagnosis not present

## 2020-08-04 DIAGNOSIS — E876 Hypokalemia: Secondary | ICD-10-CM | POA: Diagnosis not present

## 2020-08-04 DIAGNOSIS — E1122 Type 2 diabetes mellitus with diabetic chronic kidney disease: Secondary | ICD-10-CM | POA: Diagnosis not present

## 2020-08-04 DIAGNOSIS — T502X5A Adverse effect of carbonic-anhydrase inhibitors, benzothiadiazides and other diuretics, initial encounter: Secondary | ICD-10-CM | POA: Diagnosis not present

## 2020-08-04 DIAGNOSIS — R809 Proteinuria, unspecified: Secondary | ICD-10-CM | POA: Diagnosis not present

## 2020-08-11 DIAGNOSIS — E875 Hyperkalemia: Secondary | ICD-10-CM | POA: Diagnosis not present

## 2020-08-11 DIAGNOSIS — N1832 Chronic kidney disease, stage 3b: Secondary | ICD-10-CM | POA: Diagnosis not present

## 2020-09-10 DIAGNOSIS — I480 Paroxysmal atrial fibrillation: Secondary | ICD-10-CM | POA: Diagnosis not present

## 2020-09-10 DIAGNOSIS — Z79899 Other long term (current) drug therapy: Secondary | ICD-10-CM | POA: Diagnosis not present

## 2020-09-10 DIAGNOSIS — I129 Hypertensive chronic kidney disease with stage 1 through stage 4 chronic kidney disease, or unspecified chronic kidney disease: Secondary | ICD-10-CM | POA: Diagnosis not present

## 2020-09-10 DIAGNOSIS — E1169 Type 2 diabetes mellitus with other specified complication: Secondary | ICD-10-CM | POA: Diagnosis not present

## 2020-09-10 DIAGNOSIS — E7800 Pure hypercholesterolemia, unspecified: Secondary | ICD-10-CM | POA: Diagnosis not present

## 2020-09-10 DIAGNOSIS — G4733 Obstructive sleep apnea (adult) (pediatric): Secondary | ICD-10-CM | POA: Diagnosis not present

## 2020-09-10 DIAGNOSIS — F32A Depression, unspecified: Secondary | ICD-10-CM | POA: Diagnosis not present

## 2020-09-10 DIAGNOSIS — J189 Pneumonia, unspecified organism: Secondary | ICD-10-CM | POA: Diagnosis not present

## 2020-09-10 DIAGNOSIS — K219 Gastro-esophageal reflux disease without esophagitis: Secondary | ICD-10-CM | POA: Diagnosis not present

## 2020-09-10 DIAGNOSIS — D509 Iron deficiency anemia, unspecified: Secondary | ICD-10-CM | POA: Diagnosis not present

## 2020-09-10 DIAGNOSIS — E1122 Type 2 diabetes mellitus with diabetic chronic kidney disease: Secondary | ICD-10-CM | POA: Diagnosis not present

## 2020-09-10 DIAGNOSIS — N183 Chronic kidney disease, stage 3 unspecified: Secondary | ICD-10-CM | POA: Diagnosis not present

## 2020-09-10 DIAGNOSIS — T502X5A Adverse effect of carbonic-anhydrase inhibitors, benzothiadiazides and other diuretics, initial encounter: Secondary | ICD-10-CM | POA: Diagnosis not present

## 2020-09-10 DIAGNOSIS — Z8701 Personal history of pneumonia (recurrent): Secondary | ICD-10-CM | POA: Diagnosis not present

## 2020-09-10 DIAGNOSIS — R809 Proteinuria, unspecified: Secondary | ICD-10-CM | POA: Diagnosis not present

## 2020-09-10 DIAGNOSIS — D6869 Other thrombophilia: Secondary | ICD-10-CM | POA: Diagnosis not present

## 2020-09-10 DIAGNOSIS — E1129 Type 2 diabetes mellitus with other diabetic kidney complication: Secondary | ICD-10-CM | POA: Diagnosis not present

## 2020-09-10 DIAGNOSIS — E876 Hypokalemia: Secondary | ICD-10-CM | POA: Diagnosis not present

## 2020-10-08 DIAGNOSIS — I340 Nonrheumatic mitral (valve) insufficiency: Secondary | ICD-10-CM | POA: Diagnosis not present

## 2020-10-08 DIAGNOSIS — Z9889 Other specified postprocedural states: Secondary | ICD-10-CM | POA: Diagnosis not present

## 2020-10-08 DIAGNOSIS — E1129 Type 2 diabetes mellitus with other diabetic kidney complication: Secondary | ICD-10-CM | POA: Diagnosis not present

## 2020-10-08 DIAGNOSIS — R0600 Dyspnea, unspecified: Secondary | ICD-10-CM | POA: Diagnosis not present

## 2020-10-08 DIAGNOSIS — E1159 Type 2 diabetes mellitus with other circulatory complications: Secondary | ICD-10-CM | POA: Diagnosis not present

## 2020-10-08 DIAGNOSIS — R600 Localized edema: Secondary | ICD-10-CM | POA: Diagnosis not present

## 2020-10-08 DIAGNOSIS — G4733 Obstructive sleep apnea (adult) (pediatric): Secondary | ICD-10-CM | POA: Diagnosis not present

## 2020-10-08 DIAGNOSIS — E7800 Pure hypercholesterolemia, unspecified: Secondary | ICD-10-CM | POA: Diagnosis not present

## 2020-10-08 DIAGNOSIS — I480 Paroxysmal atrial fibrillation: Secondary | ICD-10-CM | POA: Diagnosis not present

## 2020-10-15 DIAGNOSIS — E119 Type 2 diabetes mellitus without complications: Secondary | ICD-10-CM | POA: Diagnosis not present

## 2020-11-19 ENCOUNTER — Encounter: Payer: Self-pay | Admitting: Emergency Medicine

## 2020-11-19 ENCOUNTER — Other Ambulatory Visit: Payer: Self-pay

## 2020-11-19 ENCOUNTER — Ambulatory Visit
Admission: EM | Admit: 2020-11-19 | Discharge: 2020-11-19 | Disposition: A | Discharge status: Home / Self Care | Payer: Medicare HMO | Attending: Emergency Medicine | Admitting: Emergency Medicine

## 2020-11-19 ENCOUNTER — Ambulatory Visit (INDEPENDENT_AMBULATORY_CARE_PROVIDER_SITE_OTHER): Payer: Medicare HMO

## 2020-11-19 ENCOUNTER — Emergency Department: Payer: Medicare HMO

## 2020-11-19 ENCOUNTER — Emergency Department
Admission: EM | Admit: 2020-11-19 | Discharge: 2020-11-20 | Disposition: A | Discharge status: Home / Self Care | Payer: Medicare HMO | Attending: Emergency Medicine | Admitting: Emergency Medicine

## 2020-11-19 DIAGNOSIS — R5381 Other malaise: Secondary | ICD-10-CM | POA: Diagnosis not present

## 2020-11-19 DIAGNOSIS — L089 Local infection of the skin and subcutaneous tissue, unspecified: Secondary | ICD-10-CM

## 2020-11-19 DIAGNOSIS — E119 Type 2 diabetes mellitus without complications: Secondary | ICD-10-CM | POA: Insufficient documentation

## 2020-11-19 DIAGNOSIS — T50Z95A Adverse effect of other vaccines and biological substances, initial encounter: Secondary | ICD-10-CM | POA: Diagnosis not present

## 2020-11-19 DIAGNOSIS — R0602 Shortness of breath: Secondary | ICD-10-CM | POA: Insufficient documentation

## 2020-11-19 DIAGNOSIS — Z8616 Personal history of COVID-19: Secondary | ICD-10-CM | POA: Insufficient documentation

## 2020-11-19 DIAGNOSIS — G4733 Obstructive sleep apnea (adult) (pediatric): Secondary | ICD-10-CM | POA: Diagnosis not present

## 2020-11-19 DIAGNOSIS — S31109A Unspecified open wound of abdominal wall, unspecified quadrant without penetration into peritoneal cavity, initial encounter: Secondary | ICD-10-CM | POA: Diagnosis not present

## 2020-11-19 DIAGNOSIS — Z79899 Other long term (current) drug therapy: Secondary | ICD-10-CM | POA: Diagnosis not present

## 2020-11-19 DIAGNOSIS — X58XXXA Exposure to other specified factors, initial encounter: Secondary | ICD-10-CM | POA: Diagnosis not present

## 2020-11-19 DIAGNOSIS — R509 Fever, unspecified: Secondary | ICD-10-CM | POA: Insufficient documentation

## 2020-11-19 DIAGNOSIS — Z7984 Long term (current) use of oral hypoglycemic drugs: Secondary | ICD-10-CM | POA: Insufficient documentation

## 2020-11-19 DIAGNOSIS — I4891 Unspecified atrial fibrillation: Secondary | ICD-10-CM | POA: Diagnosis not present

## 2020-11-19 DIAGNOSIS — I10 Essential (primary) hypertension: Secondary | ICD-10-CM | POA: Diagnosis not present

## 2020-11-19 DIAGNOSIS — R5383 Other fatigue: Secondary | ICD-10-CM | POA: Diagnosis not present

## 2020-11-19 DIAGNOSIS — F17200 Nicotine dependence, unspecified, uncomplicated: Secondary | ICD-10-CM | POA: Insufficient documentation

## 2020-11-19 DIAGNOSIS — Z7901 Long term (current) use of anticoagulants: Secondary | ICD-10-CM | POA: Diagnosis not present

## 2020-11-19 DIAGNOSIS — L989 Disorder of the skin and subcutaneous tissue, unspecified: Secondary | ICD-10-CM

## 2020-11-19 LAB — BASIC METABOLIC PANEL
Anion gap: 11 (ref 5–15)
BUN: 38 mg/dL — ABNORMAL HIGH (ref 8–23)
CO2: 23 mmol/L (ref 22–32)
Calcium: 9.2 mg/dL (ref 8.9–10.3)
Chloride: 98 mmol/L (ref 98–111)
Creatinine, Ser: 1.99 mg/dL — ABNORMAL HIGH (ref 0.44–1.00)
GFR, Estimated: 27 mL/min — ABNORMAL LOW (ref >60)
Glucose, Bld: 174 mg/dL — ABNORMAL HIGH (ref 70–99)
Potassium: 4.3 mmol/L (ref 3.5–5.1)
Sodium: 132 mmol/L — ABNORMAL LOW (ref 135–145)

## 2020-11-19 LAB — CBC
HCT: 29.4 % — ABNORMAL LOW (ref 36.0–46.0)
Hemoglobin: 9.6 g/dL — ABNORMAL LOW (ref 12.0–15.0)
MCH: 28.3 pg (ref 26.0–34.0)
MCHC: 32.7 g/dL (ref 30.0–36.0)
MCV: 86.7 fL (ref 80.0–100.0)
Platelets: 235 10*3/uL (ref 150–400)
RBC: 3.39 MIL/uL — ABNORMAL LOW (ref 3.87–5.11)
RDW: 14 % (ref 11.5–15.5)
WBC: 6.2 10*3/uL (ref 4.0–10.5)
nRBC: 0 % (ref 0.0–0.2)

## 2020-11-19 LAB — LACTIC ACID, PLASMA: Lactic Acid, Venous: 1 mmol/L (ref 0.5–1.9)

## 2020-11-19 LAB — LIPASE, BLOOD: Lipase: 44 U/L (ref 11–51)

## 2020-11-19 LAB — TROPONIN I (HIGH SENSITIVITY): Troponin I (High Sensitivity): 15 ng/L (ref <18)

## 2020-11-19 MED ORDER — ACETAMINOPHEN 500 MG PO TABS
1000.0000 mg | ORAL_TABLET | Freq: Once | ORAL | Status: AC
  Administered 2020-11-19: 1000 mg via ORAL

## 2020-11-19 NOTE — ED Triage Notes (Signed)
Pt to ED from home c/o generalized weakness, SOB, fever this morning and went to [facility] and advised to come to ED for further evaluation.  Pt had first Moderna vaccine yesterday.  States fever 103 at home and given tylenol at [facility]  Pt also states boils to left abd that have been coming and going, no obvious drainage, skin warm to touch.  Pt A&Ox4, chest rise even and unlabored, in NAD at this time.

## 2020-11-19 NOTE — Discharge Instructions (Addendum)
As we discussed, the source of your fever may be result of your body's robust immune response to the Materna vaccine or it may be the result of a systemic infection.  In any event, you need blood work to help determine which may be the potential culprit.  Please go to the ER at [facility] for evaluation.

## 2020-11-19 NOTE — ED Notes (Signed)
Patient is being discharged from the Urgent Care and sent to the Emergency Department via POV . Per Charmayne Sheer, NP, patient is in need of higher level of care due to possible systemic infection. Patient is aware and verbalizes understanding of plan of care.  Vitals:   11/19/20 1741 11/19/20 1743  BP:  (!) 137/47  Pulse:  95  Resp:  18  Temp: (!) 103.1 F (39.5 C)   SpO2:  95%

## 2020-11-19 NOTE — ED Provider Notes (Signed)
Patient [facility]    CSN: [id number] Arrival date & time: 11/19/20  1638      History   Chief Complaint Chief Complaint  Patient presents with   Fever    HPI Cindy [name] is a 70 y.o. female.   HPI  70 year old female here for evaluation of fever, shortness of breath, and dizziness.  Patient reports that she has been experiencing fever, shortness of breath, lightheadedness, and dizziness since this morning when she got up.  She is also had a headache.  She reports that she received her first Moderna vaccine yesterday at 12:00 in the afternoon.  Additionally, she has been experiencing shortness of breath and wheezing, runny nose with a clear nasal discharge, nausea, and body aches.  She denies cough, vomiting or diarrhea.  Patient is also concerned because she has 2 boils on the left side of her abdomen that have not healed and the oldest one has been there for a month.  She has seen her primary care provider for this and has been on oral antibiotics.  She reports that as 1 start to heal another 1 will develop.  Past Medical History:  Diagnosis Date   Chronic pain syndrome    Depression    Diabetes mellitus without complication (HCC)    GERD (gastroesophageal reflux disease)    History of hiatal hernia    Hypertension    Iron deficiency anemia    Microalbuminuric diabetic nephropathy (HCC)    Mitral regurgitation    Obesity    Shortness of breath dyspnea    Sleep apnea     Patient Active Problem List   Diagnosis Date Noted   Acute respiratory distress 07/16/2020   Pneumonia due to COVID-19 virus 07/15/2020   Acute respiratory failure with hypoxia ([place]) 07/15/2020   AKI (acute kidney injury) ([place]) 07/15/2020   HTN (hypertension) 07/15/2020   A-fib ([place]) 07/15/2020   Chronic pain syndrome 07/15/2020   Depression 07/15/2020   Iron deficiency 07/15/2020   Diabetes ([place]) 07/15/2020   GERD (gastroesophageal reflux disease) 07/15/2020   HLD  (hyperlipidemia) 07/15/2020   OSA (obstructive sleep apnea) 07/15/2020   Chest pain 09/07/2015    Past Surgical History:  Procedure Laterality Date   ABDOMINAL HYSTERECTOMY     BACK SURGERY     BREAST BIOPSY Right 03/2003   CARDIAC CATHETERIZATION N/A 10/10/2014   Procedure: Left Heart Cath;  Surgeon: Isaias Cowman, MD;  Location: [place] [facility];  Service: Cardiovascular;  Laterality: N/A;   CARDIAC CATHETERIZATION     carpal tunnell release  10/23/11   COLONOSCOPY WITH PROPOFOL N/A 02/20/2015   Procedure: COLONOSCOPY WITH PROPOFOL;  Surgeon: Hulen Luster, MD;  Location: [facility] ENDOSCOPY;  Service: Gastroenterology;  Laterality: N/A;   COLONOSCOPY WITH PROPOFOL N/A 04/26/2017   Procedure: COLONOSCOPY WITH PROPOFOL;  Surgeon: Toledo, Benay Pike, MD;  Location: [facility] ENDOSCOPY;  Service: Gastroenterology;  Laterality: N/A;   ESOPHAGOGASTRODUODENOSCOPY (EGD) WITH PROPOFOL N/A 02/20/2015   Procedure: ESOPHAGOGASTRODUODENOSCOPY (EGD) WITH PROPOFOL;  Surgeon: Hulen Luster, MD;  Location: [facility] ENDOSCOPY;  Service: Gastroenterology;  Laterality: N/A;   ESOPHAGOGASTRODUODENOSCOPY (EGD) WITH PROPOFOL N/A 04/26/2017   Procedure: ESOPHAGOGASTRODUODENOSCOPY (EGD) WITH PROPOFOL;  Surgeon: Toledo, Benay Pike, MD;  Location: [facility] ENDOSCOPY;  Service: Gastroenterology;  Laterality: N/A;   HERNIA REPAIR     LAPAROSCOPIC OVARIAN CYSTECTOMY  08/2001   nissen fundoplycation  11/99   NOSE SURGERY      OB History   No obstetric history on file.  Home Medications    Prior to Admission medications   Medication Sig Start Date End Date Taking? Authorizing Provider  acetaminophen (TYLENOL) 650 MG CR tablet Take 650 mg by mouth every 8 (eight) hours as needed for pain.   Yes [provider]  albuterol (VENTOLIN HFA) 108 (90 Base) MCG/ACT inhaler Inhale 1-2 puffs into the lungs every 6 (six) hours as needed for wheezing or shortness of breath. 07/18/20  Yes Amin, Ankit Chirag, MD  amLODipine  (NORVASC) 10 MG tablet Take 10 mg by mouth daily.   Yes [provider]  apixaban (ELIQUIS) 5 MG TABS tablet Take 1 tablet (5 mg total) by mouth 2 (two) times daily. 09/07/15  Yes Sudini, Alveta Heimlich, MD  atorvastatin (LIPITOR) 40 MG tablet Take 40 mg by mouth daily.   Yes [provider]  cetirizine (ZYRTEC) 10 MG tablet Take 10 mg by mouth daily.   Yes [provider]  diltiazem (CARDIZEM CD) 360 MG 24 hr capsule Take 360 mg by mouth daily. 05/30/20  Yes [provider]  ferrous sulfate 325 (65 FE) MG tablet Take 1 tablet (325 mg total) by mouth 2 (two) times daily with a meal. 07/18/20  Yes Amin, Ankit Chirag, MD  fluticasone (FLONASE) 50 MCG/ACT nasal spray Place 2 sprays into both nostrils daily.   Yes [provider]  furosemide (LASIX) 80 MG tablet Take 80 mg by mouth 2 (two) times daily.   Yes [provider]  glimepiride (AMARYL) 2 MG tablet Take 2-4 mg by mouth 2 (two) times daily. Take 4mg  in the morning and 2mg  in the evening   Yes [provider]  lisinopril (PRINIVIL,ZESTRIL) 40 MG tablet Take 40 mg by mouth daily.   Yes [provider]  metFORMIN (GLUCOPHAGE) 1000 MG tablet Take 1,000 mg by mouth 2 (two) times daily with a meal.   Yes [provider]  metoprolol succinate (TOPROL-XL) 100 MG 24 hr tablet Take 50-100 mg by mouth 2 (two) times daily. Take 1 tablet in the morning and one-half tablet in the evening. Take with or immediately following a meal.   Yes [provider]  minoxidil (LONITEN) 10 MG tablet Take 10 mg by mouth daily.   Yes [provider]  montelukast (SINGULAIR) 10 MG tablet Take 10 mg by mouth daily as needed (allergies).  06/16/15  Yes [provider]  Multiple Vitamin (MULTIVITAMIN WITH MINERALS) TABS tablet Take 1 tablet by mouth daily.   Yes [provider]  omeprazole (PRILOSEC) 20 MG capsule Take 20 mg by mouth daily as needed (heartburn).    Yes  [provider]  polyethylene glycol powder (GLYCOLAX/MIRALAX) powder Take 17 g by mouth daily.    Yes [provider]  spironolactone (ALDACTONE) 25 MG tablet Take 25 mg by mouth daily. 06/20/20   [provider]    Family History Family History  Problem Relation Age of Onset   Heart attack Mother    Hypertension Father    Stroke Father    Heart attack Brother     Social History Social History   Tobacco Use   Smoking status: Never   Smokeless tobacco: Current    Types: Snuff  Vaping Use   Vaping Use: Never used  Substance Use Topics   Alcohol use: No   Drug use: No     Allergies   Bupropion, Gabapentin, Pioglitazone, Vicodin [hydrocodone-acetaminophen], and Vicoprofen [hydrocodone-ibuprofen]   Review of Systems Review of Systems  Constitutional:  Positive for  fatigue and fever. Negative for activity change and appetite change.  HENT:  Positive for congestion and rhinorrhea.   Respiratory:  Positive for shortness of breath and wheezing. Negative for cough.   Gastrointestinal:  Positive for nausea. Negative for diarrhea and vomiting.  Musculoskeletal:  Positive for arthralgias and myalgias.  Skin:  Positive for wound.  Neurological:  Positive for dizziness.  Hematological: Negative.   Psychiatric/Behavioral: Negative.      Physical Exam Triage Vital Signs ED Triage Vitals  Enc Vitals Group     BP 11/19/20 1743 (!) 137/47     Pulse Rate 11/19/20 1743 95     Resp 11/19/20 1743 18     Temp 11/19/20 1741 (!) 103.1 F (39.5 C)     Temp Source 11/19/20 1741 Oral     SpO2 11/19/20 1743 95 %     Weight 11/19/20 1741 240 lb (108.9 kg)     Height 11/19/20 1741 5' (1.524 m)     Head Circumference --      Peak Flow --      Pain Score 11/19/20 1741 7     Pain Loc --      Pain Edu? --      Excl. in [place]? --    No data found.  Updated Vital Signs BP (!) 137/47 (BP Location: Right Arm)   Pulse 95   Temp (!) 103.1 F (39.5 C) (Oral)    Resp 18   Ht 5' (1.524 m)   Wt 240 lb (108.9 kg)   SpO2 95%   BMI 46.87 kg/m   Visual Acuity Right Eye Distance:   Left Eye Distance:   Bilateral Distance:    Right Eye Near:   Left Eye Near:    Bilateral Near:     Physical Exam Vitals and nursing note reviewed.  Constitutional:      General: She is not in acute distress.    Appearance: Normal appearance. She is obese. She is ill-appearing.  HENT:     Head: Normocephalic and atraumatic.  Cardiovascular:     Rate and Rhythm: Normal rate and regular rhythm.     Pulses: Normal pulses.     Heart sounds: Normal heart sounds. No murmur heard.   No gallop.  Pulmonary:     Effort: Pulmonary effort is normal.     Breath sounds: Normal breath sounds. No wheezing, rhonchi or rales.  Skin:    General: Skin is warm and dry.     Capillary Refill: Capillary refill takes less than 2 seconds.     Findings: Lesion present.  Neurological:     General: No focal deficit present.     Mental Status: She is alert and oriented to person, place, and time.  Psychiatric:        Mood and Affect: Mood normal.        Behavior: Behavior normal.        Thought Content: Thought content normal.        Judgment: Judgment normal.     UC Treatments / Results  Labs (all labs ordered are listed, but only abnormal results are displayed) Labs Reviewed - No data to display  EKG   Radiology DG Chest 2 View  Result Date: 11/19/2020 CLINICAL DATA:  Short of breath and fever. EXAM: CHEST - 2 VIEW COMPARISON:  07/15/2020 FINDINGS: Normal mediastinum and cardiac silhouette. Normal pulmonary vasculature. No evidence of effusion, infiltrate, or pneumothorax. No acute bony abnormality. IMPRESSION: No acute cardiopulmonary process. Electronically Signed  By: Suzy Bouchard M.D.   On: 11/19/2020 18:47    Procedures Procedures (including critical care time)  Medications Ordered in UC Medications  acetaminophen (TYLENOL) tablet 1,000 mg (1,000 mg Oral  Given 11/19/20 1817)    Initial Impression / Assessment and Plan / UC Course  I have reviewed the triage vital signs and the nursing notes.  Pertinent labs & imaging results that were available during my care of the patient were reviewed by me and considered in my medical decision making (see chart for details).  Patient is a pleasant but ill-appearing 70 year old female here with family for evaluation of fever, shortness of breath, and dizziness as outlined in the HPI above.  Physical exam reveals a benign cardiopulmonary exam with decreased breath sounds in lower and middle lobes bilaterally.  Patient has 2 skin lesions on the left side of her abdomen there is superior lesion is approximately half centimeter by half centimeter with yellow discharge and the larger lesion is approximately 4 cm x 2.5 cm, oval in nature with thick yellow-green slough in the wound bed.  Neither area is indurated or erythematous but are both hot to touch.  Patient reports that these have been recurrent skin boils that she has been treated by her primary care provider with antibiotics in the past for but they keep recurring.  Patient is not currently on any oral antibiotics.  Unsure of patient's fever and shortness of breath or a reaction to the COVID-vaccine or if she has a more infectious process at play.  We will give patient 1 g of Tylenol to help with her fever and obtain chest x-ray to look for intrathoracic process.  If chest x-ray is negative will refer patient to the emergency department for blood work and more in-depth evaluation.  Radiology interpretation of chest x-ray is that it is a negative for intrathoracic process.  Discussed radiology findings of chest x-ray with patient and family.  There is a concern based on her significant past medical history and the acute onset of fever and shortness of breath that she may have a systemic infection going on.  Explained to the patient that she needs blood work, which we  cannot do here as our lab close at 5, and possibly IV fluids, antibiotics, and potentially admission if she does have a systemic infection given her recurrent, and nonhealing skin lesions on her abdomen.  Patient has elected to go to [facility] via POV with her family member.   Final Clinical Impressions(s) / UC Diagnoses   Final diagnoses:  Febrile illness  Skin lesions     Discharge Instructions      As we discussed, the source of your fever may be result of your body's robust immune response to the Mater[employer] vaccine or it may be the result of a systemic infection.  In any event, you need blood work to help determine which may be the potential culprit.  Please go to the ER at [facility] for evaluation.     ED Prescriptions   None    PDMP not reviewed this encounter.   Margarette Canada, NP 11/19/20 1902

## 2020-11-19 NOTE — ED Triage Notes (Signed)
Patient states that she has been feeling lightheaded and has a fever with shortness of breath. States that she was unable to walk across the floor without feeling like she was going to pass out. States that she had her first initial covid vaccine, moderna.

## 2020-11-20 MED ORDER — DOXYCYCLINE HYCLATE 100 MG PO CAPS: 100.0000 mg | ORAL_CAPSULE | Freq: Two times a day (BID) | ORAL | 0 refills | Status: AC

## 2020-11-20 MED ORDER — DOXYCYCLINE HYCLATE 100 MG PO TABS
100.0000 mg | ORAL_TABLET | Freq: Once | ORAL | Status: AC
  Administered 2020-11-20: 100 mg via ORAL
  Filled 2020-11-20: qty 1

## 2020-11-20 MED ORDER — SODIUM CHLORIDE 0.9 % IV BOLUS
500.0000 mL | Freq: Once | INTRAVENOUS | Status: AC
  Administered 2020-11-20: 500 mL via INTRAVENOUS

## 2020-11-20 MED ORDER — CEPHALEXIN 500 MG PO CAPS: 500.0000 mg | ORAL_CAPSULE | Freq: Four times a day (QID) | ORAL | 0 refills | Status: DC

## 2020-11-20 MED ORDER — CEPHALEXIN 500 MG PO CAPS
500.0000 mg | ORAL_CAPSULE | Freq: Once | ORAL | Status: AC
  Administered 2020-11-20: 500 mg via ORAL
  Filled 2020-11-20: qty 1

## 2020-11-20 NOTE — Discharge Instructions (Addendum)
As we discussed, your symptoms seem to be the result of your COVID-19 vaccination, not an acute infection.  We started you on 2 antibiotics for the wounds on your abdomen and strongly encourage you to follow-up at the wound care center; they may be able to provide additional treatment and management of these chronic wounds.  Follow-up with your regular doctor.  Return to the emergency department if you develop new or worsening symptoms that concern you.

## 2020-11-20 NOTE — ED Notes (Signed)
Discharge instructions and prescriptions reviewed with daughter. Daughter verbalized understanding. Pt wheeled to waiting area with daughter.    Ruben Reason, RN

## 2020-11-20 NOTE — ED Provider Notes (Signed)
[facility] Emergency Department Provider Note  ____________________________________________   Event Date/Time   First MD Initiated Contact with Patient 11/19/20 2355     (approximate)  I have reviewed the triage vital signs and the nursing notes.   HISTORY  Chief Complaint Shortness of Breath and Weakness    HPI Cindy [name] is a 70 y.o. female with medical history as listed below who presents for evaluation of some generalized weakness, shortness of breath, fever, all in the setting of getting her Moderna vaccine the previous day.  She was feeling ill enough that she went to the [facility] urgent care and it was recommended to her that she come to the emergency department for further evaluation because of some chronic abscesses that she has on her left lower part of her abdomen and they were concerned that perhaps she is septic from the abscesses.  She said that she is feeling better by now.  Previously she had a headache, some mild shortness of breath and mild cough, fever, and generalized weakness.  She still feels tired overall but says she does not want to stay in the hospital if she does not have to.  She is not having any trouble breathing currently.  She has no chest pain.  She has no abdominal pain.  She said that the lesions on her abdomen have been present for 1 to 2 months.  She was previously treated with Keflex by Dr. Saralyn Pilar and that was close to 6 weeks ago but the lesions did not really get smaller although they seem to wax and wane in size over time.  The larger one is lower on her abdomen and is essentially open and she covers that up with a Band-Aid.  The smaller 1 of above into the right drains a small amount of purulent material from time to time.  They are not particularly painful.  She did not have any symptoms including the fever until after the Moderna vaccine.  Her symptoms are relatively acute in onset and severe but now she is feeling  better.  Nothing in particular made her better or worse.     Past Medical History:  Diagnosis Date   Chronic pain syndrome    Depression    Diabetes mellitus without complication (HCC)    GERD (gastroesophageal reflux disease)    History of hiatal hernia    Hypertension    Iron deficiency anemia    Microalbuminuric diabetic nephropathy (HCC)    Mitral regurgitation    Obesity    Shortness of breath dyspnea    Sleep apnea     Patient Active Problem List   Diagnosis Date Noted   Acute respiratory distress 07/16/2020   Pneumonia due to COVID-19 virus 07/15/2020   Acute respiratory failure with hypoxia ([place]) 07/15/2020   AKI (acute kidney injury) ([place]) 07/15/2020   HTN (hypertension) 07/15/2020   A-fib ([place]) 07/15/2020   Chronic pain syndrome 07/15/2020   Depression 07/15/2020   Iron deficiency 07/15/2020   Diabetes ([place]) 07/15/2020   GERD (gastroesophageal reflux disease) 07/15/2020   HLD (hyperlipidemia) 07/15/2020   OSA (obstructive sleep apnea) 07/15/2020   Chest pain 09/07/2015    Past Surgical History:  Procedure Laterality Date   ABDOMINAL HYSTERECTOMY     BACK SURGERY     BREAST BIOPSY Right 03/2003   CARDIAC CATHETERIZATION N/A 10/10/2014   Procedure: Left Heart Cath;  Surgeon: Isaias Cowman, MD;  Location: [facility];  Service: Cardiovascular;  Laterality:  N/A;   CARDIAC CATHETERIZATION     carpal tunnell release  10/23/11   COLONOSCOPY WITH PROPOFOL N/A 02/20/2015   Procedure: COLONOSCOPY WITH PROPOFOL;  Surgeon: Hulen Luster, MD;  Location: [facility] ENDOSCOPY;  Service: Gastroenterology;  Laterality: N/A;   COLONOSCOPY WITH PROPOFOL N/A 04/26/2017   Procedure: COLONOSCOPY WITH PROPOFOL;  Surgeon: Toledo, Benay Pike, MD;  Location: [facility] ENDOSCOPY;  Service: Gastroenterology;  Laterality: N/A;   ESOPHAGOGASTRODUODENOSCOPY (EGD) WITH PROPOFOL N/A 02/20/2015   Procedure: ESOPHAGOGASTRODUODENOSCOPY (EGD) WITH PROPOFOL;  Surgeon: Hulen Luster, MD;  Location:  [facility] ENDOSCOPY;  Service: Gastroenterology;  Laterality: N/A;   ESOPHAGOGASTRODUODENOSCOPY (EGD) WITH PROPOFOL N/A 04/26/2017   Procedure: ESOPHAGOGASTRODUODENOSCOPY (EGD) WITH PROPOFOL;  Surgeon: Toledo, Benay Pike, MD;  Location: [facility] ENDOSCOPY;  Service: Gastroenterology;  Laterality: N/A;   HERNIA REPAIR     LAPAROSCOPIC OVARIAN CYSTECTOMY  08/2001   nissen fundoplycation  11/99   NOSE SURGERY      Prior to Admission medications   Medication Sig Start Date End Date Taking? Authorizing Provider  cephALEXin (KEFLEX) 500 MG capsule Take 1 capsule (500 mg total) by mouth 4 (four) times daily. 11/20/20  Yes Hinda Kehr, MD  doxycycline (VIBRAMYCIN) 100 MG capsule Take 1 capsule (100 mg total) by mouth 2 (two) times daily for 10 days. 11/20/20 11/30/20 Yes Hinda Kehr, MD  acetaminophen (TYLENOL) 650 MG CR tablet Take 650 mg by mouth every 8 (eight) hours as needed for pain.    [provider]  albuterol (VENTOLIN HFA) 108 (90 Base) MCG/ACT inhaler Inhale 1-2 puffs into the lungs every 6 (six) hours as needed for wheezing or shortness of breath. 07/18/20   Amin, Jeanella Flattery, MD  amLODipine (NORVASC) 10 MG tablet Take 10 mg by mouth daily.    [provider]  apixaban (ELIQUIS) 5 MG TABS tablet Take 1 tablet (5 mg total) by mouth 2 (two) times daily. 09/07/15   Hillary Bow, MD  atorvastatin (LIPITOR) 40 MG tablet Take 40 mg by mouth daily.    [provider]  cetirizine (ZYRTEC) 10 MG tablet Take 10 mg by mouth daily.    [provider]  diltiazem (CARDIZEM CD) 360 MG 24 hr capsule Take 360 mg by mouth daily. 05/30/20   [provider]  ferrous sulfate 325 (65 FE) MG tablet Take 1 tablet (325 mg total) by mouth 2 (two) times daily with a meal. 07/18/20   Amin, Jeanella Flattery, MD  fluticasone (FLONASE) 50 MCG/ACT nasal spray Place 2 sprays into both nostrils daily.    [provider]  furosemide (LASIX) 80 MG tablet Take 80 mg by mouth 2 (two) times  daily.    [provider]  glimepiride (AMARYL) 2 MG tablet Take 2-4 mg by mouth 2 (two) times daily. Take 4mg  in the morning and 2mg  in the evening    [provider]  lisinopril (PRINIVIL,ZESTRIL) 40 MG tablet Take 40 mg by mouth daily.    [provider]  metFORMIN (GLUCOPHAGE) 1000 MG tablet Take 1,000 mg by mouth 2 (two) times daily with a meal.    [provider]  metoprolol succinate (TOPROL-XL) 100 MG 24 hr tablet Take 50-100 mg by mouth 2 (two) times daily. Take 1 tablet in the morning and one-half tablet in the evening. Take with or immediately following a meal.    [provider]  minoxidil (LONITEN) 10 MG tablet Take 10 mg by mouth daily.    [provider]  montelukast (SINGULAIR) 10 MG  tablet Take 10 mg by mouth daily as needed (allergies).  06/16/15   [provider]  Multiple Vitamin (MULTIVITAMIN WITH MINERALS) TABS tablet Take 1 tablet by mouth daily.    [provider]  omeprazole (PRILOSEC) 20 MG capsule Take 20 mg by mouth daily as needed (heartburn).     [provider]  polyethylene glycol powder (GLYCOLAX/MIRALAX) powder Take 17 g by mouth daily.     [provider]  spironolactone (ALDACTONE) 25 MG tablet Take 25 mg by mouth daily. 06/20/20   [provider]    Allergies Bupropion, Gabapentin, Pioglitazone, Vicodin [hydrocodone-acetaminophen], and Vicoprofen [hydrocodone-ibuprofen]  Family History  Problem Relation Age of Onset   Heart attack Mother    Hypertension Father    Stroke Father    Heart attack Brother     Social History Social History   Tobacco Use   Smoking status: Never   Smokeless tobacco: Current    Types: Snuff  Vaping Use   Vaping Use: Never used  Substance Use Topics   Alcohol use: No   Drug use: No    Review of Systems Constitutional: Positive for fever.  Positive for generalized weakness and malaise. Eyes: No visual changes. ENT: No  sore throat. Cardiovascular: Denies chest pain. Respiratory: Mild shortness of breath and cough, now improved. Gastrointestinal: No abdominal pain.  +{nausea, no vomiting.  No diarrhea.  No constipation. Genitourinary: Negative for dysuria. Musculoskeletal: Negative for neck pain.  Negative for back pain. Integumentary: Negative for rash. Neurological: Generalized weakness and generalized headache.  No focal weakness or numbness.   ____________________________________________   PHYSICAL EXAM:  VITAL SIGNS: ED Triage Vitals  Enc Vitals Group     BP 11/19/20 2029 134/75     Pulse Rate 11/19/20 2029 82     Resp 11/19/20 2029 16     Temp 11/19/20 2029 100 F (37.8 C)     Temp Source 11/19/20 2029 Oral     SpO2 11/19/20 2029 96 %     Weight 11/19/20 2030 108.9 kg (240 lb)     Height 11/19/20 2030 1.524 m (5')     Head Circumference --      Peak Flow --      Pain Score 11/19/20 2030 7     Pain Loc --      Pain Edu? --      Excl. in [place]? --     Constitutional: Alert and oriented.  Eyes: Conjunctivae are normal.  Head: Atraumatic. Nose: No congestion/rhinnorhea. Mouth/Throat: Patient is wearing a mask. Neck: No stridor.  No meningeal signs.   Cardiovascular: Normal rate, regular rhythm. Good peripheral circulation. Respiratory: Normal respiratory effort.  No retractions. Gastrointestinal: Soft and nontender. No distention.  Musculoskeletal: No lower extremity tenderness nor edema. No gross deformities of extremities. Neurologic:  Normal speech and language. No gross focal neurologic deficits are appreciated.  Psychiatric: Mood and affect are normal. Speech and behavior are normal. Skin: Patient has 2 wounds on her left lower quadrant of her abdomen that appear chronic.  The upper wound is about 2 cm in diameter with no surrounding cellulitis.  It is indurated but not fluctuant.  There is a small amount of draining purulent material in the center.  The larger wound is nearly 5 cm  in diameter and appears more chronic with some purulent material but also some granulation tissue.  There is no surrounding cellulitis, no crepitus, no pain out of proportion, no suggestion of necrotizing fasciitis or other emergent  condition.  See photo below.    ____________________________________________   LABS (all labs ordered are listed, but only abnormal results are displayed)  Labs Reviewed  CBC - Abnormal; Notable for the following components:      Result Value   RBC 3.39 (*)    Hemoglobin 9.6 (*)    HCT 29.4 (*)    All other components within normal limits  BASIC METABOLIC PANEL - Abnormal; Notable for the following components:   Sodium 132 (*)    Glucose, Bld 174 (*)    BUN 38 (*)    Creatinine, Ser 1.99 (*)    GFR, Estimated 27 (*)    All other components within normal limits  LIPASE, BLOOD  LACTIC ACID, PLASMA  TROPONIN I (HIGH SENSITIVITY)   ____________________________________________  EKG  ED ECG REPORT I, Hinda Kehr, the attending physician, personally viewed and interpreted this ECG.  Date: 11/19/2020 EKG Time: 20:26 Rate: 81 Rhythm: Sinus rhythm with first-degree AV block QRS Axis: Left axis deviation Intervals: Incomplete right bundle branch block, LVH, or borderline prolonged QTC at 490 ms ST/T Wave abnormalities: Non-specific ST segment / T-wave changes, but no clear evidence of acute ischemia. Narrative Interpretation: no definitive evidence of acute ischemia; does not meet STEMI criteria.  ____________________________________________  RADIOLOGY I, Hinda Kehr, personally viewed and evaluated these images (plain radiographs) as part of my medical decision making, as well as reviewing the written report by the radiologist.  ED MD interpretation: No acute abnormalities on chest x-ray  Official radiology report(s): DG Chest 2 View  Result Date: 11/19/2020 CLINICAL DATA:  Short of breath and fever. EXAM: CHEST - 2 VIEW COMPARISON:   07/15/2020 FINDINGS: Normal mediastinum and cardiac silhouette. Normal pulmonary vasculature. No evidence of effusion, infiltrate, or pneumothorax. No acute bony abnormality. IMPRESSION: No acute cardiopulmonary process. Electronically Signed   By: Suzy Bouchard M.D.   On: 11/19/2020 18:47    ____________________________________________   INITIAL IMPRESSION / [facility] / [place] / ED COURSE  As part of my medical decision making, I reviewed the following data within the [facility] notes reviewed and incorporated, Labs reviewed , EKG interpreted , Old chart reviewed, and Notes from prior ED visits   Differential diagnosis includes, but is not limited to, side effect of COVID vaccination, pneumonia, cellulitis, bacteremia, sepsis,  Patient is generally well-appearing in spite of her age and her symptoms.  Vital signs are essentially stable; she has a slightly elevated temperature and slightly elevated blood pressure but overall her vital signs are reassuring.  She feels better now than she did earlier today and has been observed in the ED for more than 6 hours.  Her lab work was also generally reassuring with no leukocytosis, stable hemoglobin, normal lactic acid, normal lipase, and essentially normal high-sensitivity troponin.  Her basic metabolic panel shows a slightly decreased sodium level and more significantly a creatinine of 1.99 with a corresponding BUN of 38.  She has had elevated creatinine in the past but it does not seem to be her baseline.  I talked with her about it and she is having no trouble tolerating oral intake and fluids.  She does not want to stay in the hospital she does not have to and I do not think she would benefit significantly from an inpatient stay.  I gave her 500 mL of normal saline for hydration encourage p.o. fluids.  She knows the importance of rehydration as an outpatient and she will also follow-up  with her primary care doctor to  discuss whether or not she would benefit from repeat lab work to see if her creatinine is improving.  Regarding her chronic wounds, I started her on a course of Keflex and doxycycline for broader coverage.  However I believe that she would benefit from wound care consult was given the chronic nature of these wounds.  She agrees with this plan and I put in the ambulatory wound care center consult order.  I gave my usual and customary return precautions.  The patient understands and agrees with the plan.  ____________________________________________  FINAL CLINICAL IMPRESSION(S) / ED DIAGNOSES  Final diagnoses:  Vaccination side effects, initial encounter  Fever, unspecified fever cause  Malaise and fatigue  Chronic abdominal wound infection, initial encounter     MEDICATIONS GIVEN DURING THIS VISIT:  Medications  sodium chloride 0.9 % bolus 500 mL (500 mLs Intravenous New Bag/Given 11/20/20 0218)  doxycycline (VIBRA-TABS) tablet 100 mg (100 mg Oral Given 11/20/20 0228)  cephALEXin (KEFLEX) capsule 500 mg (500 mg Oral Given 11/20/20 0228)     ED Discharge Orders          Ordered    AMB referral to wound care center        11/20/20 0230    doxycycline (VIBRAMYCIN) 100 MG capsule  2 times daily        11/20/20 0231    cephALEXin (KEFLEX) 500 MG capsule  4 times daily        11/20/20 0231             Note:  This document was prepared using Dragon voice recognition software and may include unintentional dictation errors.   Hinda Kehr, MD 11/20/20 [phone]

## 2020-11-26 DIAGNOSIS — I340 Nonrheumatic mitral (valve) insufficiency: Secondary | ICD-10-CM | POA: Diagnosis not present

## 2020-11-26 DIAGNOSIS — R600 Localized edema: Secondary | ICD-10-CM | POA: Diagnosis not present

## 2020-11-26 DIAGNOSIS — R0600 Dyspnea, unspecified: Secondary | ICD-10-CM | POA: Diagnosis not present

## 2020-11-27 ENCOUNTER — Encounter: Payer: Medicare HMO | Attending: Physician Assistant | Admitting: Physician Assistant

## 2020-11-27 ENCOUNTER — Other Ambulatory Visit: Payer: Self-pay

## 2020-11-27 DIAGNOSIS — Z7901 Long term (current) use of anticoagulants: Secondary | ICD-10-CM | POA: Insufficient documentation

## 2020-11-27 DIAGNOSIS — I480 Paroxysmal atrial fibrillation: Secondary | ICD-10-CM | POA: Diagnosis not present

## 2020-11-27 DIAGNOSIS — Z888 Allergy status to other drugs, medicaments and biological substances status: Secondary | ICD-10-CM | POA: Insufficient documentation

## 2020-11-27 DIAGNOSIS — E11622 Type 2 diabetes mellitus with other skin ulcer: Secondary | ICD-10-CM | POA: Insufficient documentation

## 2020-11-27 DIAGNOSIS — Z885 Allergy status to narcotic agent status: Secondary | ICD-10-CM | POA: Insufficient documentation

## 2020-11-27 DIAGNOSIS — L98492 Non-pressure chronic ulcer of skin of other sites with fat layer exposed: Secondary | ICD-10-CM | POA: Insufficient documentation

## 2020-11-28 DIAGNOSIS — L98492 Non-pressure chronic ulcer of skin of other sites with fat layer exposed: Secondary | ICD-10-CM | POA: Diagnosis not present

## 2020-11-28 NOTE — Progress Notes (Signed)
Cindy [name], Cindy [name] ([id number]) Visit Report for 11/27/2020 Abuse/Suicide Risk Screen Details Patient Name: Cindy [name], Cindy [name]. Date of Service: 11/27/2020 10:15 AM Medical Record Number: [id number] Patient Account Number: [id number] Date of Birth/Sex: 06-06-1950 (70 y.o. F) Treating RN: Carlene Coria Primary Care Kacey Vicuna: Thereasa Distance Other Clinician: Referring Mathilde Mcwherter: Hinda Kehr Treating Charlee Whitebread/Extender: Jeri Cos Weeks in Treatment: 0 Abuse/Suicide Risk Screen Items Answer ABUSE RISK SCREEN: Has anyone close to you tried to hurt or harm you recentlyo No Do you feel uncomfortable with anyone in your familyo No Has anyone forced you do things that you didnot want to doo No Electronic Signature(s) Signed: 11/27/2020 6:25:03 PM By: Carlene Coria RN Entered By: Carlene Coria on 11/27/2020 10:30:23 Symanski, Leen J. ([id number]) -------------------------------------------------------------------------------- Activities of Daily Living Details Patient Name: Cindy [name]. Date of Service: 11/27/2020 10:15 AM Medical Record Number: [id number] Patient Account Number: [id number] Date of Birth/Sex: January 11, 1951 (70 y.o. F) Treating RN: Carlene Coria Primary Care Deann Mclaine: Thereasa Distance Other Clinician: Referring Burwell Bethel: Hinda Kehr Treating Nadiya Pieratt/Extender: Jeri Cos Weeks in Treatment: 0 Activities of Daily Living Items Answer Activities of Daily Living (Please select one for each item) Drive Automobile Not Able Take Medications Completely Able Use Telephone Completely Able Care for Appearance Need Assistance Use Toilet Need Assistance Bath / Shower Need Assistance Dress Self Need Assistance Feed Self Completely Able Walk Not Able Get In / Out Bed Need Assistance Housework Not Able Prepare Meals Not Able Handle Money Completely Able Shop for Self Not Able Electronic Signature(s) Signed: 11/27/2020 6:25:03 PM By: Carlene Coria RN Entered By: Carlene Coria on  11/27/2020 10:33:55 Hillery, Nevaeh J. ([id number]) -------------------------------------------------------------------------------- Education Screening Details Patient Name: Cindy [name]. Date of Service: 11/27/2020 10:15 AM Medical Record Number: [id number] Patient Account Number: [id number] Date of Birth/Sex: [birth year] (70 y.o. F) Treating RN: Carlene Coria Primary Care Emre Stock: Thereasa Distance Other Clinician: Referring Jasiel Belisle: Hinda Kehr Treating Melea Prezioso/Extender: Skipper Cliche in Treatment: 0 Primary Learner Assessed: Patient Learning Preferences/Education Level/Primary Language Learning Preference: Explanation Highest Education Level: High School Preferred Language: English Cognitive Barrier Language Barrier: No Translator Needed: No Memory Deficit: No Emotional Barrier: No Cultural/Religious Beliefs Affecting Medical Care: No Physical Barrier Impaired Vision: Yes Glasses Impaired Hearing: No Decreased Hand dexterity: No Knowledge/Comprehension Knowledge Level: Medium Comprehension Level: High Ability to understand written instructions: High Ability to understand verbal instructions: High Motivation Anxiety Level: Anxious Cooperation: Cooperative Education Importance: Acknowledges Need Interest in Health Problems: Asks Questions Perception: Coherent Willingness to Engage in Self-Management High Activities: Readiness to Engage in Self-Management High Activities: Electronic Signature(s) Signed: 11/27/2020 6:25:03 PM By: Carlene Coria RN Entered By: Carlene Coria on 11/27/2020 10:34:30 Harbuck, Jazmon J. ([id number]) -------------------------------------------------------------------------------- Fall Risk Assessment Details Patient Name: Cindy [name]. Date of Service: 11/27/2020 10:15 AM Medical Record Number: [id number] Patient Account Number: [id number] Date of Birth/Sex: 1950/06/07 (70 y.o. F) Treating RN: Carlene Coria Primary Care Valda Christenson:  Thereasa Distance Other Clinician: Referring Kennth Vanbenschoten: Hinda Kehr Treating Arzella Rehmann/Extender: Jeri Cos Weeks in Treatment: 0 Fall Risk Assessment Items Have you had 2 or more falls in the last 12 monthso 0 No Have you had any fall that resulted in injury in the last 12 monthso 0 No FALLS RISK SCREEN History of falling - immediate or within 3 months 0 No Secondary diagnosis (Do you have 2 or more medical diagnoseso) 0 No Ambulatory aid None/bed rest/wheelchair/nurse 0 No Crutches/cane/walker 0 No Furniture 0 No Intravenous therapy Access/Saline/Heparin Lock 0 No Gait/Transferring Normal/ bed rest/ wheelchair 0 No Weak (short steps with  or without shuffle, stooped but able to lift head while walking, may 0 No seek support from furniture) Impaired (short steps with shuffle, may have difficulty arising from chair, head down, impaired 0 No balance) Mental Status Oriented to own ability 0 No Electronic Signature(s) Signed: 11/27/2020 6:25:03 PM By: Carlene Coria RN Entered By: Carlene Coria on 11/27/2020 10:34:43 Savitz, Yatziry J. ([id number]) -------------------------------------------------------------------------------- Foot Assessment Details Patient Name: Cindy [name]. Date of Service: 11/27/2020 10:15 AM Medical Record Number: [id number] Patient Account Number: [id number] Date of Birth/Sex: September 23, 1950 (70 y.o. F) Treating RN: Carlene Coria Primary Care Keiden Deskin: Thereasa Distance Other Clinician: Referring Renatta Shrieves: Hinda Kehr Treating Woods Gangemi/Extender: Jeri Cos Weeks in Treatment: 0 Foot Assessment Items Site Locations + = Sensation present, - = Sensation absent, C = Callus, U = Ulcer R = Redness, W = Warmth, M = Maceration, PU = Pre-ulcerative lesion F = Fissure, S = Swelling, D = Dryness Assessment Right: Left: Other Deformity: No No Prior Foot Ulcer: No No Prior Amputation: No No Charcot Joint: No No Ambulatory Status: Non-ambulatory Assistance Device:  Wheelchair Gait: Administrator, arts) Signed: 11/27/2020 6:25:03 PM By: Carlene Coria RN Entered By: Carlene Coria on 11/27/2020 10:35:31 Herskowitz, Bernadene J. ([id number]) -------------------------------------------------------------------------------- Nutrition Risk Screening Details Patient Name: Cindy [name]. Date of Service: 11/27/2020 10:15 AM Medical Record Number: [id number] Patient Account Number: [id number] Date of Birth/Sex: 1950/09/27 (70 y.o. F) Treating RN: Carlene Coria Primary Care Oluwatamilore Starnes: Thereasa Distance Other Clinician: Referring Aasim Restivo: Hinda Kehr Treating Laney Bagshaw/Extender: Jeri Cos Weeks in Treatment: 0 Height (in): 60 Weight (lbs): 242 Body Mass Index (BMI): 47.3 Nutrition Risk Screening Items Score Screening NUTRITION RISK SCREEN: I have an illness or condition that made me change the kind and/or amount of food I eat 0 No I eat fewer than two meals per day 0 No I eat few fruits and vegetables, or milk products 0 No I have three or more drinks of beer, liquor or wine almost every day 0 No I have tooth or mouth problems that make it hard for me to eat 0 No I don't always have enough money to buy the food I need 0 No I eat alone most of the time 0 No I take three or more different prescribed or over-the-counter drugs a day 1 Yes Without wanting to, I have lost or gained 10 pounds in the last six months 0 No I am not always physically able to shop, cook and/or feed myself 2 Yes Nutrition Protocols Good Risk Protocol Moderate Risk Protocol 0 Provide education on nutrition High Risk Proctocol Risk Level: Moderate Risk Score: 3 Electronic Signature(s) Signed: 11/27/2020 6:25:03 PM By: Carlene Coria RN Entered By: Carlene Coria on 11/27/2020 10:35:09

## 2020-11-28 NOTE — Progress Notes (Signed)
Cindy [name] ([id number]) Visit Report for 11/27/2020 Allergy List Details Patient Name: Cindy [name], Cindy [name]. Date of Service: 11/27/2020 10:15 AM Medical Record Number: [id number] Patient Account Number: [id number] Date of Birth/Sex: 02/24/1951 (70 y.o. F) Treating RN: Cindy [name] Primary Care Cindy [name]: Cindy [name] Other Clinician: Referring Cindy [name]: Cindy [name] Treating Mavryk Pino/Extender: Cindy [name] Weeks in Treatment: 0 Allergies Active Allergies bupropion gabapentin pioglitazone Vicodin Vicoprofen Allergy Notes Electronic Signature(s) Signed: 11/27/2020 6:25:03 PM By: Cindy Coria RN Entered By: Cindy [name] on 11/27/2020 10:24:46 [name], [name] ([id number]) -------------------------------------------------------------------------------- Arrival Information Details Patient Name: Bland Span. Date of Service: 11/27/2020 10:15 AM Medical Record Number: [id number] Patient Account Number: [id number] Date of Birth/Sex: 11-28-1950 (70 y.o. F) Treating RN: Cindy [name] Primary Care Kaydin Labo: Cindy [name] Other Clinician: Referring Cindy [name]: Cindy [name] Treating Deryn Massengale/Extender: Cindy [name] in Treatment: 0 Visit Information Patient Arrived: Wheel Chair Arrival Time: 10:12 Accompanied By: daughter Transfer Assistance: None Patient Identification Verified: Yes Secondary Verification Process Completed: Yes Patient Requires Transmission-Based Precautions: No Patient Has Alerts: No Electronic Signature(s) Signed: 11/27/2020 6:25:03 PM By: Cindy Coria RN Entered By: Cindy [name] on 11/27/2020 10:19:49 [name], [name] ([id number]) -------------------------------------------------------------------------------- Clinic Level of Care Assessment Details Patient Name: Bland Span. Date of Service: 11/27/2020 10:15 AM Medical Record Number: [id number] Patient Account Number: [id number] Date of Birth/Sex: January 07, 1951 (70 y.o. F) Treating RN: Cindy [name] Primary Care [place]aco [place]: Cindy [name] Other Clinician: Referring Elkin Belfield: Cindy [name] Treating Iyla Balzarini/Extender: Cindy [name] Weeks in Treatment: 0 Clinic Level of Care Assessment Items TOOL 1 Quantity Score X - Use when EandM and Procedure is performed on INITIAL visit 1 0 ASSESSMENTS - Nursing Assessment / Reassessment X - General Physical Exam (combine w/ comprehensive assessment (listed just below) when performed on new 1 20 pt. evals) X- 1 25 Comprehensive Assessment (HX, ROS, Risk Assessments, Wounds Hx, etc.) ASSESSMENTS - Wound and Skin Assessment / Reassessment X - Dermatologic / Skin Assessment (not related to wound area) 1 10 ASSESSMENTS - Ostomy and/or Continence Assessment and Care []  - Incontinence Assessment and Management 0 []  - 0 Ostomy Care Assessment and Management (repouching, etc.) PROCESS - Coordination of Care X - Simple Patient / Family Education for ongoing care 1 15 []  - 0 Complex (extensive) Patient / Family Education for ongoing care X- 1 10 Staff obtains Programmer, systems, Records, Test Results / Process Orders []  - 0 Staff telephones HHA, Nursing Homes / Clarify orders / etc []  - 0 Routine Transfer to another Facility (non-emergent condition) []  - 0 Routine Hospital Admission (non-emergent condition) []  - 0 New Admissions / Biomedical engineer / Ordering NPWT, Apligraf, etc. []  - 0 Emergency Hospital Admission (emergent condition) PROCESS - Special Needs []  - Pediatric / Minor Patient Management 0 []  - 0 Isolation Patient Management []  - 0 Hearing / Language / Visual special needs []  - 0 Assessment of Community assistance (transportation, D/C planning, etc.) []  - 0 Additional assistance / Altered mentation []  - 0 Support Surface(s) Assessment (bed, cushion, seat, etc.) INTERVENTIONS - Miscellaneous []  - External ear exam 0 []  - 0 Patient Transfer (multiple staff / Civil Service fast streamer / Similar devices) []  - 0 Simple Staple / Suture  removal (25 or less) []  - 0 Complex Staple / Suture removal (26 or more) []  - 0 Hypo/Hyperglycemic Management (do not check if billed separately) []  - 0 Ankle / Brachial Index (ABI) - do not check if billed separately Has the patient been seen at the hospital within the last three years: Yes Total Score:  80 Level Of Care: New/Established - Level 3 [name], [name] ([id number]) Electronic Signature(s) Signed: 11/27/2020 4:24:12 PM By: Cindy Amen RN Entered By: Cindy [name], Cindy [name] on 11/27/2020 11:10:09 [name], [name] ([id number]) -------------------------------------------------------------------------------- Encounter Discharge Information Details Patient Name: Bland Span. Date of Service: 11/27/2020 10:15 AM Medical Record Number: [id number] Patient Account Number: [id number] Date of Birth/Sex: 04-27-1951 (70 y.o. F) Treating RN: Cindy [name] Primary Care Kwasi Joung: Cindy [name] Other Clinician: Referring Betsabe Iglesia: Cindy [name] Treating Storm Sovine/Extender: Cindy [name] Weeks in Treatment: 0 Encounter Discharge Information Items Post Procedure Vitals Discharge Condition: Stable Temperature (F): 98.7 Ambulatory Status: Wheelchair Pulse (bpm): 60 Discharge Destination: Home Respiratory Rate (breaths/min): 16 Transportation: Private Auto Blood Pressure (mmHg): 147/77 Accompanied By: daughter Schedule Follow-up Appointment: Yes Clinical Summary of Care: Electronic Signature(s) Signed: 11/27/2020 4:45:13 PM By: Cindy [name] Entered By: Cindy [name] on 11/27/2020 11:31:44 [name], [name] ([id number]) -------------------------------------------------------------------------------- Lower Extremity Assessment Details Patient Name: Bland Span. Date of Service: 11/27/2020 10:15 AM Medical Record Number: [id number] Patient Account Number: [id number] Date of Birth/Sex: 1950-12-05 (70 y.o. F) Treating RN: Cindy [name] Primary Care Cashawn Yanko: Cindy [name] Other  Clinician: Referring Joeann Steppe: Cindy [name] Treating Harun Brumley/Extender: Cindy [name] Weeks in Treatment: 0 Electronic Signature(s) Signed: 11/27/2020 6:25:03 PM By: Cindy Coria RN Entered By: Cindy [name] on 11/27/2020 10:41:37 Dredge, Aminat J. ([id number]) -------------------------------------------------------------------------------- Multi Wound Chart Details Patient Name: Bland Span. Date of Service: 11/27/2020 10:15 AM Medical Record Number: [id number] Patient Account Number: [id number] Date of Birth/Sex: Sep 27, 1950 (70 y.o. F) Treating RN: Cindy [name] Primary Care Rhondalyn Clingan: Cindy [name] Other Clinician: Referring Malita Ignasiak: Cindy [name] Treating Marabella Popiel/Extender: Cindy [name] Weeks in Treatment: 0 Vital Signs Height(in): 60 Pulse(bpm): 60 Weight(lbs): 242 Blood Pressure(mmHg): 147/77 Body Mass Index(BMI): 47 Temperature(F): 98.6 Respiratory Rate(breaths/min): 18 Photos: [N/A:N/A] Wound Location: Left Abdomen - Lower Quadrant N/A N/A Wounding Event: Gradually Appeared N/A N/A Primary Etiology: Inflammatory N/A N/A Comorbid History: Hypertension, Type II Diabetes N/A N/A Date Acquired: 08/29/2020 N/A N/A Weeks of Treatment: 0 N/A N/A Wound Status: Open N/A N/A Measurements L x W x D (cm) 2.2x3.5x0.1 N/A N/A Area (cm) : 6.048 N/A N/A Volume (cm) : 0.605 N/A N/A Classification: Full Thickness Without Exposed N/A N/A Support Structures Exudate Amount: Medium N/A N/A Exudate Type: Serosanguineous N/A N/A Exudate Color: red, brown N/A N/A Granulation Amount: Small (1-33%) N/A N/A Granulation Quality: Red, Pink N/A N/A Necrotic Amount: Large (67-100%) N/A N/A Exposed Structures: Fat Layer (Subcutaneous Tissue): N/A N/A Yes Fascia: No Tendon: No Muscle: No Joint: No Bone: No Epithelialization: None N/A N/A Treatment Notes Electronic Signature(s) Signed: 11/27/2020 4:24:12 PM By: Cindy Amen RN Entered By: Cindy [name], Cindy [name] on 11/27/2020  11:01:45 Torry, Charl J. ([id number]) -------------------------------------------------------------------------------- Multi-Disciplinary Care Plan Details Patient Name: Bland Span. Date of Service: 11/27/2020 10:15 AM Medical Record Number: [id number] Patient Account Number: [id number] Date of Birth/Sex: 08/13/1950 (70 y.o. F) Treating RN: Cindy [name] Primary Care Catalina Salasar: Cindy [name] Other Clinician: Referring Madeeha Costantino: Cindy [name] Treating Gladyes Kudo/Extender: Cindy [name] Weeks in Treatment: 0 Active Inactive Orientation to the Wound Care Program Nursing Diagnoses: Knowledge deficit related to the wound healing center program Goals: Patient/caregiver will verbalize understanding of the [facility] Date Initiated: 11/27/2020 Target Resolution Date: 11/27/2020 Goal Status: Active Interventions: Provide education on orientation to the wound center Notes: Wound/Skin Impairment Nursing Diagnoses: Impaired tissue integrity Goals: Patient/caregiver will verbalize understanding of skin care regimen Date Initiated: 11/27/2020 Target Resolution Date: 11/27/2020 Goal Status: Active Ulcer/skin breakdown will have a volume reduction  of 30% by week 4 Date Initiated: 11/27/2020 Target Resolution Date: 12/27/2020 Goal Status: Active Ulcer/skin breakdown will have a volume reduction of 50% by week 8 Date Initiated: 11/27/2020 Target Resolution Date: 01/27/2021 Goal Status: Active Ulcer/skin breakdown will have a volume reduction of 80% by week 12 Date Initiated: 11/27/2020 Target Resolution Date: 02/27/2021 Goal Status: Active Ulcer/skin breakdown will heal within 14 weeks Date Initiated: 11/27/2020 Target Resolution Date: 03/29/2021 Goal Status: Active Interventions: Assess patient/caregiver ability to obtain necessary supplies Assess patient/caregiver ability to perform ulcer/skin care regimen upon admission and as needed Assess ulceration(s) every  visit Provide education on ulcer and skin care Treatment Activities: Referred to DME [facility] Ulatowski for dressing supplies : 11/27/2020 Skin care regimen initiated : 11/27/2020 Notes: Electronic Signature(s) Signed: 11/27/2020 4:24:12 PM By: Cindy Amen RN Entered By: Cindy [name], Cindy [name] on 11/27/2020 11:01:35 Brumley, Gerre J. ([id number]) Mccorkle, Timeka J. ([id number]) -------------------------------------------------------------------------------- Pain Assessment Details Patient Name: Bland Span. Date of Service: 11/27/2020 10:15 AM Medical Record Number: [id number] Patient Account Number: [id number] Date of Birth/Sex: 22-Jan-1951 (70 y.o. F) Treating RN: Cindy [name] Primary Care Chez Bulnes: Cindy [name] Other Clinician: Referring Jamarie J[place]lin: Cindy [name] Treating Glayds Insco/Extender: Cindy [name] Weeks in Treatment: 0 Active Problems Location of Pain Severity and Description of Pain Patient Has Paino No Site Locations Pain Management and Medication Current Pain Management: Electronic Signature(s) Signed: 11/27/2020 6:25:03 PM By: Cindy Coria RN Entered By: Cindy [name] on 11/27/2020 10:20:02 Mcgrail, Margeaux Lenna Sciara ([id number]) -------------------------------------------------------------------------------- Patient/Caregiver Education Details Patient Name: Bland Span. Date of Service: 11/27/2020 10:15 AM Medical Record Number: [id number] Patient Account Number: [id number] Date of Birth/Gender: [birth year] (70 y.o. F) Treating RN: Cindy [name] Primary Care Physician: Cindy [name] Other Clinician: Referring Physician: Hinda [name] Treating Physician/Extender: Cindy [name] in Treatment: 0 Education Assessment Education Provided To: Patient Education Topics Provided Welcome To [facility]: Methods: Explain/Verbal Responses: State content correctly Wound/Skin Impairment: Methods: Explain/Verbal Responses: State content correctly Electronic  Signature(s) Signed: 11/27/2020 4:24:12 PM By: Cindy Amen RN Entered By: Cindy [name], Cindy [name] on 11/27/2020 11:10:30 Pracht, Melvena J. ([id number]) -------------------------------------------------------------------------------- Wound Assessment Details Patient Name: Bland Span. Date of Service: 11/27/2020 10:15 AM Medical Record Number: [id number] Patient Account Number: [id number] Date of Birth/Sex: Dec 24, 1950 (70 y.o. F) Treating RN: Cindy [name] Primary Care Cattleya Dobratz: Cindy [name] Other Clinician: Referring Arun Herrod: Cindy [name] Treating Zyden Suman/Extender: Cindy [name] Weeks in Treatment: 0 Wound Status Wound Number: 1 Primary Etiology: Inflammatory Wound Location: Left Abdomen - Lower Quadrant Wound Status: Open Wounding Event: Gradually Appeared Comorbid History: Hypertension, Type II Diabetes Date Acquired: 08/29/2020 Weeks Of Treatment: 0 Clustered Wound: No Photos Wound Measurements Length: (cm) 2.2 Width: (cm) 3.5 Depth: (cm) 0.1 Area: (cm) 6.048 Volume: (cm) 0.605 % Reduction in Area: % Reduction in Volume: Epithelialization: None Tunneling: No Undermining: No Wound Description Classification: Full Thickness Without Exposed Support Structures Exudate Amount: Medium Exudate Type: Serosanguineous Exudate Color: red, brown Foul Odor After Cleansing: No Slough/Fibrino Yes Wound Bed Granulation Amount: Small (1-33%) Exposed Structure Granulation Quality: Red, Pink Fascia Exposed: No Necrotic Amount: Large (67-100%) Fat Layer (Subcutaneous Tissue) Exposed: Yes Necrotic Quality: Adherent Slough Tendon Exposed: No Muscle Exposed: No Joint Exposed: No Bone Exposed: No Treatment Notes Wound #1 (Abdomen - Lower Quadrant) Wound Laterality: Left Cleanser [place] Ancillary Kit - 15 Day Supply Discharge Instruction: Use supplies as instructed; Kit contains: (15) Saline Bullets; (15) 3x3 Gauze; 15 pr Gloves Soap and Water Discharge Instruction:  Gently cleanse wound with antibacterial soap, rinse and pat dry prior  to dressing wounds Schnackenberg, Alila J. ([id number]) Peri-Wound Care Topical Primary Dressing Xeroform-HBD 2x2 (in/in) Discharge Instruction: Apply Xeroform-HBD 2x2 (in/in) as directed Secondary Dressing ABD Pad 5x9 (in/in) Discharge Instruction: Cover with ABD pad Secured With 51M [place] Surgical Tape, 2x2 (in/yd) Discharge Instruction: Secure ABD Compression Wrap Compression Stockings Add-Ons Electronic Signature(s) Signed: 11/27/2020 6:25:03 PM By: Cindy Coria RN Entered By: Cindy [name] on 11/27/2020 10:41:15 Beavin, Ariza J. ([id number]) -------------------------------------------------------------------------------- Vitals Details Patient Name: Bland Span. Date of Service: 11/27/2020 10:15 AM Medical Record Number: [id number] Patient Account Number: [id number] Date of Birth/Sex: 09/15/1950 (70 y.o. F) Treating RN: Cindy [name] Primary Care Kina Shiffman: Cindy [name] Other Clinician: Referring Roselin Wiemann: Cindy [name] Treating Joandry Slagter/Extender: Cindy [name] Weeks in Treatment: 0 Vital Signs Time Taken: 10:20 Temperature (F): 98.6 Height (in): 60 Pulse (bpm): 60 Source: Stated Respiratory Rate (breaths/min): 18 Weight (lbs): 242 Blood Pressure (mmHg): 147/77 Source: Stated Reference Range: 80 - 120 mg / dl Body Mass Index (BMI): 47.3 Electronic Signature(s) Signed: 11/27/2020 6:25:03 PM By: Cindy Coria RN Entered By: Cindy [name] on 11/27/2020 10:22:58

## 2020-11-28 NOTE — Progress Notes (Signed)
[name], [name] ([id number]) Visit Report for 11/27/2020 Chief Complaint Document Details Patient Name: Cindy [name], [name] Date of Service: 11/27/2020 10:15 AM Medical Record Number: [id number] Patient Account Number: [id number] Date of Birth/Sex: 03/30/1951 (70 y.o. F) Treating RN: Sanchez, Kenia Primary Care Provider: Feldpausch, Dale Other Clinician: Referring Provider: Forbach, Cory Treating Provider/Extender: Stone, Hoyt Weeks in Treatment: 0 Information Obtained from: Patient Chief Complaint Abdominal Ulcer Electronic Signature(s) Signed: 11/27/2020 10:58:15 AM By: Stone III, Hoyt PA-C Entered By: Stone III, Hoyt on 11/27/2020 10:58:15 Crevier, Lashayla J. ([id number]) -------------------------------------------------------------------------------- Debridement Details Patient Name: [name], [name] Date of Service: 11/27/2020 10:15 AM Medical Record Number: [id number] Patient Account Number: [id number] Date of Birth/Sex: 11/01/1950 (70 y.o. F) Treating RN: Sanchez, Kenia Primary Care Provider: Feldpausch, Dale Other Clinician: Referring Provider: Forbach, Cory Treating Provider/Extender: Stone, Hoyt Weeks in Treatment: 0 Debridement Performed for Wound #1 Left Abdomen - Lower Quadrant Assessment: Performed By: Physician Stone, Hoyt E., PA-C Debridement Type: Debridement Level of Consciousness (Pre- Awake and Alert procedure): Pre-procedure Verification/Time Out Yes - 11:03 Taken: Start Time: 11:03 Total Area Debrided (L x W): 2.2 (cm) x 3.5 (cm) = 7.7 (cm) Tissue and other material Viable, Non-Viable, Subcutaneous, Fibrin/Exudate debrided: Level: Skin/Subcutaneous Tissue Debridement Description: Excisional Instrument: Curette Bleeding: Minimum Hemostasis Achieved: Pressure Response to Treatment: Procedure was tolerated well Level of Consciousness (Post- Awake and Alert procedure): Post Debridement Measurements of Total Wound Length: (cm) 2.2 Width: (cm)  3.5 Depth: (cm) 0.2 Volume: (cm) 1.21 Character of Wound/Ulcer Post Debridement: Stable Post Procedure Diagnosis Same as Pre-procedure Electronic Signature(s) Signed: 11/27/2020 4:24:12 PM By: Sanchez , Kenia RN Signed: 11/27/2020 11:03:25 PM By: Stone III, Hoyt PA-C Entered By: Sanchez Pereyda, Kenia on 11/27/2020 11:05:38 Femia, Corie J. ([id number]) -------------------------------------------------------------------------------- HPI Details Patient Name: [name], [name] Date of Service: 11/27/2020 10:15 AM Medical Record Number: [id number] Patient Account Number: [id number] Date of Birth/Sex: 03/04/1951 (70 y.o. F) Treating RN: Sanchez, Kenia Primary Care Provider: Feldpausch, Dale Other Clinician: Referring Provider: Forbach, Cory Treating Provider/Extender: Stone, Hoyt Weeks in Treatment: 0 History of Present Illness HPI Description: 11/27/2020 upon evaluation today patient presents today for initial inspection here in our clinic concerning issues that she has been having with a wound over the left abdominal wall. Subsequently this is actually something that to be honest appears to have potentially been a infection type issue. Subsequently the patient states that she has been having spots pop up all over and none of them have been quite as large as what this is currently. Nonetheless she tells me that she has been try to manage these as much as possible on her own she has been using Hibiclens to wash them with but again not washing head to toe necessarily. Fortunately there does not appear to be any signs of overall systemic infection and even locally I do not see anything obvious at the moment. She is on doxycycline and has been using triamcinolone as given to her by Dr. Graham who is a local dermatologist. This wound in particular has been present for about 2 months although she has been having the issue for quite some time. Patient does have a history of hypertension, atrial  fibrillation, and long-term use of anticoagulant therapy, Electronic Signature(s) Signed: 11/27/2020 3:07:51 PM By: Stone III, Hoyt PA-C Entered By: Stone III, Hoyt on 11/27/2020 15:07:50 Cuello, Earla J. ([id number]) -------------------------------------------------------------------------------- Physical Exam Details Patient Name: [name], [name] Date of Service: 11/27/2020 10:15 AM Medical Record Number: [id number] Patient Account Number: [id number] Date of Birth/Sex: 03/12/1951 (  70 y.o. F) Treating RN: Sanchez, Kenia Primary Care Provider: Feldpausch, Dale Other Clinician: Referring Provider: Forbach, Cory Treating Provider/Extender: Stone, Hoyt Weeks in Treatment: 0 Constitutional sitting or standing blood pressure is within target range for patient.. pulse regular and within target range for patient.. respirations regular, non- labored and within target range for patient.. temperature within target range for patient.. Well-nourished and well-hydrated in no acute distress. Eyes conjunctiva clear no eyelid edema noted. pupils equal round and reactive to light and accommodation. Ears, Nose, Mouth, and Throat no gross abnormality of ear auricles or external auditory canals. normal hearing noted during conversation. mucus membranes moist. Respiratory normal breathing without difficulty. Cardiovascular 2+ dorsalis pedis/posterior tibialis pulses. no clubbing, cyanosis, significant edema, <3 sec cap refill. Musculoskeletal normal gait and posture. no significant deformity or arthritic changes, no loss or range of motion, no clubbing. Psychiatric this patient is able to make decisions and demonstrates good insight into disease process. Alert and Oriented x 3. pleasant and cooperative. Notes Upon inspection patient's wound bed actually showed signs of necrotic tissue on the surface of the wound. Fortunately there does not appear to be any signs of active infection at this time which is  great news and overall I am extremely pleased with where things stand today. No fevers, chills, nausea, vomiting, or diarrhea. I did perform debridement clear away the necrotic tissue and underneath it appears to be fairly healthy in my opinion. Fortunately there is no signs of cellulitis or infection in the immediate area. Electronic Signature(s) Signed: 11/27/2020 3:08:55 PM By: Stone III, Hoyt PA-C Entered By: Stone III, Hoyt on 11/27/2020 15:08:54 Tafoya, Kacy J. ([id number]) -------------------------------------------------------------------------------- Physician Orders Details Patient Name: Spadaccini, Nadalyn J. Date of Service: 11/27/2020 10:15 AM Medical Record Number: [id number] Patient Account Number: [id number] Date of Birth/Sex: 06/30/1950 (70 y.o. F) Treating RN: Sanchez, Kenia Primary Care Provider: Feldpausch, Dale Other Clinician: Referring Provider: Forbach, Cory Treating Provider/Extender: Stone, Hoyt Weeks in Treatment: 0 Verbal / Phone Orders: No Diagnosis Coding ICD-10 Coding Code Description L98.492 Non-pressure chronic ulcer of skin of other sites with fat layer exposed I10 Essential (primary) hypertension I48.0 Paroxysmal atrial fibrillation Z79.01 Long term (current) use of anticoagulants Follow-up Appointments o Return Appointment in 1 week. Bathing/ Shower/ Hygiene o May shower; gently cleanse wound with antibacterial soap, rinse and pat dry prior to dressing wounds Additional Orders / Instructions o Other: - Start Mupirocin regimen-instructions on prescription Wound Treatment Wound #1 - Abdomen - Lower Quadrant Wound Laterality: Left Cleanser: Byram Ancillary Kit - 15 Day Supply (DME) (Generic) 1 x Per Day/15 Days Discharge Instructions: Use supplies as instructed; Kit contains: (15) Saline Bullets; (15) 3x3 Gauze; 15 pr Gloves Cleanser: Soap and Water 1 x Per Day/15 Days Discharge Instructions: Gently cleanse wound with antibacterial soap, rinse and  pat dry prior to dressing wounds Primary Dressing: Xeroform-HBD 2x2 (in/in) (DME) (Generic) 1 x Per Day/15 Days Discharge Instructions: Apply Xeroform-HBD 2x2 (in/in) as directed Secondary Dressing: ABD Pad 5x9 (in/in) (DME) (Generic) 1 x Per Day/15 Days Discharge Instructions: Cover with ABD pad Secured With: 3M Medipore H Soft Cloth Surgical Tape, 2x2 (in/yd) (DME) (Generic) 1 x Per Day/15 Days Discharge Instructions: Secure ABD Patient Medications Allergies: bupropion, gabapentin, pioglitazone, Vicodin, Vicoprofen Notifications Medication Indication Start End mupirocin DOSE topical 2 % ointment - topically Apply a thin film in the nostrils and under your fingernails 1 time per day for 1 week then 1 time per week for 4 weeks following Electronic Signature(s) Signed: 11/27/2020 11:18:56 AM By:   Stone III, Hoyt PA-C Entered By: Stone III, Hoyt on 11/27/2020 11:18:56 Shishido, Jamiria J. ([id number]) -------------------------------------------------------------------------------- Problem List Details Patient Name: Mcnay, Kennita J. Date of Service: 11/27/2020 10:15 AM Medical Record Number: [id number] Patient Account Number: [id number] Date of Birth/Sex: 03/31/1951 (70 y.o. F) Treating RN: Sanchez, Kenia Primary Care Provider: Feldpausch, Dale Other Clinician: Referring Provider: Forbach, Cory Treating Provider/Extender: Stone, Hoyt Weeks in Treatment: 0 Active Problems ICD-10 Encounter Code Description Active Date MDM Diagnosis L03.311 Cellulitis of abdominal wall 11/27/2020 No Yes L98.492 Non-pressure chronic ulcer of skin of other sites with fat layer exposed 11/27/2020 No Yes I10 Essential (primary) hypertension 11/27/2020 No Yes I48.0 Paroxysmal atrial fibrillation 11/27/2020 No Yes Z79.01 Long term (current) use of anticoagulants 11/27/2020 No Yes Inactive Problems Resolved Problems Electronic Signature(s) Signed: 11/27/2020 11:20:44 AM By: Stone III, Hoyt PA-C Previous Signature:  11/27/2020 10:58:02 AM Version By: Stone III, Hoyt PA-C Entered By: Stone III, Hoyt on 11/27/2020 11:20:44 Kackley, Tawsha J. ([id number]) -------------------------------------------------------------------------------- Progress Note Details Patient Name: Knoch, Leshia J. Date of Service: 11/27/2020 10:15 AM Medical Record Number: [id number] Patient Account Number: [id number] Date of Birth/Sex: 05/20/1951 (70 y.o. F) Treating RN: Sanchez, Kenia Primary Care Provider: Feldpausch, Dale Other Clinician: Referring Provider: Forbach, Cory Treating Provider/Extender: Stone, Hoyt Weeks in Treatment: 0 Subjective Chief Complaint Information obtained from Patient Abdominal Ulcer History of Present Illness (HPI) 11/27/2020 upon evaluation today patient presents today for initial inspection here in our clinic concerning issues that she has been having with a wound over the left abdominal wall. Subsequently this is actually something that to be honest appears to have potentially been a infection type issue. Subsequently the patient states that she has been having spots pop up all over and none of them have been quite as large as what this is currently. Nonetheless she tells me that she has been try to manage these as much as possible on her own she has been using Hibiclens to wash them with but again not washing head to toe necessarily. Fortunately there does not appear to be any signs of overall systemic infection and even locally I do not see anything obvious at the moment. She is on doxycycline and has been using triamcinolone as given to her by Dr. Graham who is a local dermatologist. This wound in particular has been present for about 2 months although she has been having the issue for quite some time. Patient does have a history of hypertension, atrial fibrillation, and long-term use of anticoagulant therapy, Patient History Information obtained from Patient. Allergies bupropion, gabapentin,  pioglitazone, Vicodin, Vicoprofen Social History Never smoker, Marital Status - Married, Alcohol Use - Never, Drug Use - No History, Caffeine Use - Daily. Medical History Cardiovascular Patient has history of Hypertension Endocrine Patient has history of Type II Diabetes - 20 Patient is treated with Insulin, Oral Agents. Review of Systems (ROS) Eyes Complains or has symptoms of Glasses / Contacts. Ear/Nose/Mouth/Throat Denies complaints or symptoms of Difficult clearing ears, Sinusitis. Genitourinary Denies complaints or symptoms of Kidney failure/ Dialysis, Incontinence/dribbling. Integumentary (Skin) Complains or has symptoms of Wounds. Objective Constitutional sitting or standing blood pressure is within target range for patient.. pulse regular and within target range for patient.. respirations regular, non- labored and within target range for patient.. temperature within target range for patient.. Well-nourished and well-hydrated in no acute distress. Vitals Time Taken: 10:20 AM, Height: 60 in, Source: Stated, Weight: 242 lbs, Source: Stated, BMI: 47.3, Temperature: 98.6 °F, Pulse: 60 bpm, Respiratory Rate: 18 breaths/min, Blood Pressure:   147/77 mmHg. Froemming, Shaquana J. ([id number]) Eyes conjunctiva clear no eyelid edema noted. pupils equal round and reactive to light and accommodation. Ears, Nose, Mouth, and Throat no gross abnormality of ear auricles or external auditory canals. normal hearing noted during conversation. mucus membranes moist. Respiratory normal breathing without difficulty. Cardiovascular 2+ dorsalis pedis/posterior tibialis pulses. no clubbing, cyanosis, significant edema, Musculoskeletal normal gait and posture. no significant deformity or arthritic changes, no loss or range of motion, no clubbing. Psychiatric this patient is able to make decisions and demonstrates good insight into disease process. Alert and Oriented x 3. pleasant and cooperative. General  Notes: Upon inspection patient's wound bed actually showed signs of necrotic tissue on the surface of the wound. Fortunately there does not appear to be any signs of active infection at this time which is great news and overall I am extremely pleased with where things stand today. No fevers, chills, nausea, vomiting, or diarrhea. I did perform debridement clear away the necrotic tissue and underneath it appears to be fairly healthy in my opinion. Fortunately there is no signs of cellulitis or infection in the immediate area. Integumentary (Hair, Skin) Wound #1 status is Open. Original cause of wound was Gradually Appeared. The date acquired was: 08/29/2020. The wound is located on the Left Abdomen - Lower Quadrant. The wound measures 2.2cm length x 3.5cm width x 0.1cm depth; 6.048cm^2 area and 0.605cm^3 volume. There is Fat Layer (Subcutaneous Tissue) exposed. There is no tunneling or undermining noted. There is a medium amount of serosanguineous drainage noted. There is small (1-33%) red, pink granulation within the wound bed. There is a large (67-100%) amount of necrotic tissue within the wound bed including Adherent Slough. Assessment Active Problems ICD-10 Cellulitis of abdominal wall Non-pressure chronic ulcer of skin of other sites with fat layer exposed Essential (primary) hypertension Paroxysmal atrial fibrillation Long term (current) use of anticoagulants Procedures Wound #1 Pre-procedure diagnosis of Wound #1 is an Inflammatory located on the Left Abdomen - Lower Quadrant . There was a Excisional Skin/Subcutaneous Tissue Debridement with a total area of 7.7 sq cm performed by Tommie Sams., PA-C. With the following instrument(s): Curette to remove Viable and Non-Viable tissue/material. Material removed includes Subcutaneous Tissue and Fibrin/Exudate and. A time out was conducted at 11:03, prior to the start of the procedure. A Minimum amount of bleeding was controlled with Pressure.  The procedure was tolerated well. Post Debridement Measurements: 2.2cm length x 3.5cm width x 0.2cm depth; 1.21cm^3 volume. Character of Wound/Ulcer Post Debridement is stable. Post procedure Diagnosis Wound #1: Same as Pre-Procedure Plan Follow-up Appointments: Return Appointment in 1 week. Bathing/ Shower/ Hygiene: May shower; gently cleanse wound with antibacterial soap, rinse and pat dry prior to dressing wounds Additional Orders / Instructions: Other: - Start Mupirocin regimen-instructions on prescription The following medication(s) was prescribed: mupirocin topical 2 % ointment topically Apply a thin film in the nostrils and under your fingernails 1 Rampy, Lovely J. ([id number]) time per day for 1 week then 1 time per week for 4 weeks following WOUND #1: - Abdomen - Lower Quadrant Wound Laterality: Left Cleanser: Byram Ancillary Kit - 15 Day Supply (DME) (Generic) 1 x Per Day/15 Days Discharge Instructions: Use supplies as instructed; Kit contains: (15) Saline Bullets; (15) 3x3 Gauze; 15 pr Gloves Cleanser: Soap and Water 1 x Per Day/15 Days Discharge Instructions: Gently cleanse wound with antibacterial soap, rinse and pat dry prior to dressing wounds Primary Dressing: Xeroform-HBD 2x2 (in/in) (DME) (Generic) 1 x Per PRF/16 Days Discharge Instructions:  Apply Xeroform-HBD 2x2 (in/in) as directed Secondary Dressing: ABD Pad 5x9 (in/in) (DME) (Generic) 1 x Per Day/15 Days Discharge Instructions: Cover with ABD pad Secured With: 3M Medipore H Soft Cloth Surgical Tape, 2x2 (in/yd) (DME) (Generic) 1 x Per Day/15 Days Discharge Instructions: Secure ABD 1. I would recommend currently based on what I am seeing that we should probably have the patientInitiate a regimen over the next 5 weeks of Hibiclens washing daily head to toe for 1 week and then weekly thereafter for [redacted] weeks along with using the mupirocin on the same schedule underneath her nails and in her nose that was prescribed for  her today. 2. I am also can recommend at this time that we have the patient continue to use Xeroform over the wound seems to be very dry I think Xeroform is good to be the best option. 3. I am also can recommend that we cover this with an ABD pad currently. We will see patient back for reevaluation in 1 week here in the clinic. If anything worsens or changes patient will contact our office for additional recommendations. Electronic Signature(s) Signed: 11/27/2020 3:09:55 PM By: Stone III, Hoyt PA-C Entered By: Stone III, Hoyt on 11/27/2020 15:09:55 Welty, Emaly J. ([id number]) -------------------------------------------------------------------------------- ROS/PFSH Details Patient Name: Streiff, Berklee J. Date of Service: 11/27/2020 10:15 AM Medical Record Number: [id number] Patient Account Number: [id number] Date of Birth/Sex: 05/14/1951 (70 y.o. F) Treating RN: Epps, Carrie Primary Care Provider: Feldpausch, Dale Other Clinician: Referring Provider: Forbach, Cory Treating Provider/Extender: Stone, Hoyt Weeks in Treatment: 0 Information Obtained From Patient Eyes Complaints and Symptoms: Positive for: Glasses / Contacts Ear/Nose/Mouth/Throat Complaints and Symptoms: Negative for: Difficult clearing ears; Sinusitis Genitourinary Complaints and Symptoms: Negative for: Kidney failure/ Dialysis; Incontinence/dribbling Integumentary (Skin) Complaints and Symptoms: Positive for: Wounds Cardiovascular Medical History: Positive for: Hypertension Endocrine Medical History: Positive for: Type II Diabetes - 20 Time with diabetes: 20 years Treated with: Insulin, Oral agents Immunizations Pneumococcal Vaccine: Received Pneumococcal Vaccination: No Implantable Devices None Family and Social History Never smoker; Marital Status - Married; Alcohol Use: Never; Drug Use: No History; Caffeine Use: Daily; Financial Concerns: No; Food, Clothing or Shelter Needs: No; Support System Lacking:  No; Transportation Concerns: No Electronic Signature(s) Signed: 11/27/2020 6:25:03 PM By: Epps, Carrie RN Signed: 11/27/2020 11:03:25 PM By: Stone III, Hoyt PA-C Entered By: Epps, Carrie on 11/27/2020 10:30:06 Maull, Cing J. ([id number]) -------------------------------------------------------------------------------- SuperBill Details Patient Name: Meyering, Chelle J. Date of Service: 11/27/2020 Medical Record Number: [id number] Patient Account Number: [id number] Date of Birth/Sex: 01/27/1951 (70 y.o. F) Treating RN: Sanchez, Kenia Primary Care Provider: Feldpausch, Dale Other Clinician: Referring Provider: Forbach, Cory Treating Provider/Extender: Stone, Hoyt Weeks in Treatment: 0 Diagnosis Coding ICD-10 Codes Code Description L98.492 Non-pressure chronic ulcer of skin of other sites with fat layer exposed I10 Essential (primary) hypertension I48.0 Paroxysmal atrial fibrillation Z79.01 Long term (current) use of anticoagulants Facility Procedures CPT4 Code: 76100138 Description: 99213 - WOUND CARE VISIT-LEV 3 EST PT Modifier: Quantity: 1 CPT4 Code: 36100012 Description: 11042 - DEB SUBQ TISSUE 20 SQ CM/< Modifier: Quantity: 1 CPT4 Code: Description: ICD-10 Diagnosis Description L98.492 Non-pressure chronic ulcer of skin of other sites with fat layer expose Modifier: d Quantity: Physician Procedures CPT4 Code: 6770473 Description: 99204 - WC PHYS LEVEL 4 - NEW PT Modifier: 25 Quantity: 1 CPT4 Code: Description: ICD-10 Diagnosis Description L98.492 Non-pressure chronic ulcer of skin of other sites with fat layer expose I10 Essential (primary) hypertension I48.0 Paroxysmal atrial fibrillation Z79.01 Long term (current) use of anticoagulants   Modifier: d Quantity: CPT4 Code: [id number] Description: 11042 - WC PHYS SUBQ TISS 20 SQ CM Modifier: Quantity: 1 CPT4 Code: Description: ICD-10 Diagnosis Description L98.492 Non-pressure chronic ulcer of skin of other sites with fat layer  expose Modifier: d Quantity: Electronic Signature(s) Signed: 11/27/2020 3:10:14 PM By: Stone III, Hoyt PA-C Entered By: Stone III, Hoyt on 11/27/2020 15:10:14 

## 2020-12-01 ENCOUNTER — Encounter: Payer: Self-pay | Admitting: Nurse Practitioner

## 2020-12-01 DIAGNOSIS — I700 Atherosclerosis of aorta: Secondary | ICD-10-CM | POA: Insufficient documentation

## 2020-12-01 DIAGNOSIS — E1122 Type 2 diabetes mellitus with diabetic chronic kidney disease: Secondary | ICD-10-CM | POA: Insufficient documentation

## 2020-12-03 ENCOUNTER — Other Ambulatory Visit: Payer: Self-pay

## 2020-12-03 ENCOUNTER — Ambulatory Visit (INDEPENDENT_AMBULATORY_CARE_PROVIDER_SITE_OTHER): Payer: Medicare HMO | Admitting: Nurse Practitioner

## 2020-12-03 ENCOUNTER — Encounter: Payer: Self-pay | Admitting: Nurse Practitioner

## 2020-12-03 VITALS — BP 126/56 | HR 59 | Temp 98.8°F | Ht 61.0 in | Wt 238.8 lb

## 2020-12-03 DIAGNOSIS — U099 Post covid-19 condition, unspecified: Secondary | ICD-10-CM

## 2020-12-03 DIAGNOSIS — L0293 Carbuncle, unspecified: Secondary | ICD-10-CM

## 2020-12-03 DIAGNOSIS — E1169 Type 2 diabetes mellitus with other specified complication: Secondary | ICD-10-CM | POA: Diagnosis not present

## 2020-12-03 DIAGNOSIS — E785 Hyperlipidemia, unspecified: Secondary | ICD-10-CM

## 2020-12-03 DIAGNOSIS — E1159 Type 2 diabetes mellitus with other circulatory complications: Secondary | ICD-10-CM | POA: Diagnosis not present

## 2020-12-03 DIAGNOSIS — F321 Major depressive disorder, single episode, moderate: Secondary | ICD-10-CM

## 2020-12-03 DIAGNOSIS — Z7689 Persons encountering health services in other specified circumstances: Secondary | ICD-10-CM | POA: Diagnosis not present

## 2020-12-03 DIAGNOSIS — D508 Other iron deficiency anemias: Secondary | ICD-10-CM

## 2020-12-03 DIAGNOSIS — E1122 Type 2 diabetes mellitus with diabetic chronic kidney disease: Secondary | ICD-10-CM

## 2020-12-03 DIAGNOSIS — I480 Paroxysmal atrial fibrillation: Secondary | ICD-10-CM | POA: Diagnosis not present

## 2020-12-03 DIAGNOSIS — D6869 Other thrombophilia: Secondary | ICD-10-CM

## 2020-12-03 DIAGNOSIS — I340 Nonrheumatic mitral (valve) insufficiency: Secondary | ICD-10-CM

## 2020-12-03 DIAGNOSIS — I152 Hypertension secondary to endocrine disorders: Secondary | ICD-10-CM | POA: Diagnosis not present

## 2020-12-03 DIAGNOSIS — N184 Chronic kidney disease, stage 4 (severe): Secondary | ICD-10-CM | POA: Diagnosis not present

## 2020-12-03 DIAGNOSIS — I700 Atherosclerosis of aorta: Secondary | ICD-10-CM | POA: Diagnosis not present

## 2020-12-03 MED ORDER — FLUTICASONE PROPIONATE 50 MCG/ACT NA SUSP: 2.0000 | Freq: Every day | NASAL | 7 refills | Status: DC

## 2020-12-03 MED ORDER — ALBUTEROL SULFATE HFA 108 (90 BASE) MCG/ACT IN AERS: 1.0000 | INHALATION_SPRAY | Freq: Four times a day (QID) | RESPIRATORY_TRACT | 1 refills | Status: DC | PRN

## 2020-12-03 NOTE — Assessment & Plan Note (Signed)
Ongoing, initially noted on labs during Covid and continues to be present.  Recheck CMP and CBC today + iron and B12 levels.  If eGFR remains <30 this check discussed with patient will need to discontinue Metformin or if above >30 but still low then will reduce Metformin to 500 MG BID, consider addition of GLP 1, discussed with patient and her daughter.  Continue Glimepiride and Jardiance at this time, will place CCM referral for assist with SGLT2.  Referral to nephrology placed after discussion with patient, for further work-up of ongoing kidney disease.  Urine ALB next visit.  Return in 3 months.

## 2020-12-03 NOTE — Patient Instructions (Signed)
Diabetes Mellitus and Nutrition, Adult When you have diabetes, or diabetes mellitus, it is very important to have healthy eating habits because your blood sugar (glucose) levels are greatly affected by what you eat and drink. Eating healthy foods in the right amounts, at about the same times every day, can help you:  Control your blood glucose.  Lower your risk of heart disease.  Improve your blood pressure.  Reach or maintain a healthy weight. What can affect my meal plan? Every person with diabetes is different, and each person has different needs for a meal plan. Your health care provider may recommend that you work with a dietitian to make a meal plan that is best for you. Your meal plan may vary depending on factors such as:  The calories you need.  The medicines you take.  Your weight.  Your blood glucose, blood pressure, and cholesterol levels.  Your activity level.  Other health conditions you have, such as heart or kidney disease. How do carbohydrates affect me? Carbohydrates, also called carbs, affect your blood glucose level more than any other type of food. Eating carbs naturally raises the amount of glucose in your blood. Carb counting is a method for keeping track of how many carbs you eat. Counting carbs is important to keep your blood glucose at a healthy level, especially if you use insulin or take certain oral diabetes medicines. It is important to know how many carbs you can safely have in each meal. This is different for every person. Your dietitian can help you calculate how many carbs you should have at each meal and for each snack. How does alcohol affect me? Alcohol can cause a sudden decrease in blood glucose (hypoglycemia), especially if you use insulin or take certain oral diabetes medicines. Hypoglycemia can be a life-threatening condition. Symptoms of hypoglycemia, such as sleepiness, dizziness, and confusion, are similar to symptoms of having too much  alcohol.  Do not drink alcohol if: ? Your health care provider tells you not to drink. ? You are pregnant, may be pregnant, or are planning to become pregnant.  If you drink alcohol: ? Do not drink on an empty stomach. ? Limit how much you use to:  0-1 drink a day for women.  0-2 drinks a day for men. ? Be aware of how much alcohol is in your drink. In the [place] one drink equals one 12 oz bottle of beer (355 mL), one 5 oz glass of wine (148 mL), or one 1 oz glass of hard liquor (44 mL). ? Keep yourself hydrated with water, diet soda, or unsweetened iced tea.  Keep in mind that regular soda, juice, and other mixers may contain a lot of sugar and must be counted as carbs. What are tips for following this plan? Reading food labels  Start by checking the serving size on the "Nutrition Facts" label of packaged foods and drinks. The amount of calories, carbs, fats, and other nutrients listed on the label is based on one serving of the item. Many items contain more than one serving per package.  Check the total grams (g) of carbs in one serving. You can calculate the number of servings of carbs in one serving by dividing the total carbs by 15. For example, if a food has 30 g of total carbs per serving, it would be equal to 2 servings of carbs.  Check the number of grams (g) of saturated fats and trans fats in one serving. Choose foods that have   a low amount or none of these fats.  Check the number of milligrams (mg) of salt (sodium) in one serving. Most people should limit total sodium intake to less than 2,300 mg per day.  Always check the nutrition information of foods labeled as "low-fat" or "nonfat." These foods may be higher in added sugar or refined carbs and should be avoided.  Talk to your dietitian to identify your daily goals for nutrients listed on the label. Shopping  Avoid buying canned, pre-made, or processed foods. These foods tend to be high in fat, sodium, and added  sugar.  Shop around the outside edge of the grocery store. This is where you will most often find fresh fruits and vegetables, bulk grains, fresh meats, and fresh dairy. Cooking  Use low-heat cooking methods, such as baking, instead of high-heat cooking methods like deep frying.  Cook using healthy oils, such as olive, canola, or sunflower oil.  Avoid cooking with butter, cream, or high-fat meats. Meal planning  Eat meals and snacks regularly, preferably at the same times every day. Avoid going long periods of time without eating.  Eat foods that are high in fiber, such as fresh fruits, vegetables, beans, and whole grains. Talk with your dietitian about how many servings of carbs you can eat at each meal.  Eat 4-6 oz (112-168 g) of lean protein each day, such as lean meat, chicken, fish, eggs, or tofu. One ounce (oz) of lean protein is equal to: ? 1 oz (28 g) of meat, chicken, or fish. ? 1 egg. ?  cup (62 g) of tofu.  Eat some foods each day that contain healthy fats, such as avocado, nuts, seeds, and fish.   What foods should I eat? Fruits Berries. Apples. Oranges. Peaches. Apricots. Plums. Grapes. Mango. Papaya. Pomegranate. Kiwi. Cherries. Vegetables Lettuce. Spinach. Leafy greens, including kale, chard, collard greens, and mustard greens. Beets. Cauliflower. Cabbage. Broccoli. Carrots. Green beans. Tomatoes. Peppers. Onions. Cucumbers. Brussels sprouts. Grains Whole grains, such as whole-wheat or whole-grain bread, crackers, tortillas, cereal, and pasta. Unsweetened oatmeal. Quinoa. Brown or wild rice. Meats and other proteins Seafood. Poultry without skin. Lean cuts of poultry and beef. Tofu. Nuts. Seeds. Dairy Low-fat or fat-free dairy products such as milk, yogurt, and cheese. The items listed above may not be a complete list of foods and beverages you can eat. Contact a dietitian for more information. What foods should I avoid? Fruits Fruits canned with  syrup. Vegetables Canned vegetables. Frozen vegetables with butter or cream sauce. Grains Refined white flour and flour products such as bread, pasta, snack foods, and cereals. Avoid all processed foods. Meats and other proteins Fatty cuts of meat. Poultry with skin. Breaded or fried meats. Processed meat. Avoid saturated fats. Dairy Full-fat yogurt, cheese, or milk. Beverages Sweetened drinks, such as soda or iced tea. The items listed above may not be a complete list of foods and beverages you should avoid. Contact a dietitian for more information. Questions to ask a health care provider  Do I need to meet with a diabetes educator?  Do I need to meet with a dietitian?  What number can I call if I have questions?  When are the best times to check my blood glucose? Where to find more information:  American Diabetes Association: diabetes.org  Academy of Nutrition and Dietetics: www.eatright.org  National Institute of Diabetes and Digestive and Kidney Diseases: www.niddk.nih.gov  Association of Diabetes Care and Education Specialists: www.diabeteseducator.org Summary  It is important to have healthy eating   habits because your blood sugar (glucose) levels are greatly affected by what you eat and drink.  A healthy meal plan will help you control your blood glucose and maintain a healthy lifestyle.  Your health care provider may recommend that you work with a dietitian to make a meal plan that is best for you.  Keep in mind that carbohydrates (carbs) and alcohol have immediate effects on your blood glucose levels. It is important to count carbs and to use alcohol carefully. This information is not intended to replace advice given to you by your health care provider. Make sure you discuss any questions you have with your health care provider. Document Revised: 04/24/2019 Document Reviewed: 04/24/2019 Elsevier Patient Education  2021 [employer]  

## 2020-12-03 NOTE — Assessment & Plan Note (Signed)
Mild mitral and tricuspid regurgitation noted on echo June 2022.  Continue collaboration with cardiology and current medication regimen. 

## 2020-12-03 NOTE — Assessment & Plan Note (Signed)
Noted on 07/15/20 imaging.  At this time continue statin and Eliquis daily for prevention.   °

## 2020-12-03 NOTE — Assessment & Plan Note (Signed)
Chronic, ongoing.  Denies SI/HI.  Continue Duloxetine at this time, which also offers benefit to chronic pain.  Refills as needed.  Will adjust dosing or add medication as needed.  Return in 3 months.

## 2020-12-03 NOTE — Assessment & Plan Note (Signed)
Chronic, ongoing with recent April A1c 8.2%, above goal.  Recheck CMP and CBC today + iron and B12 levels.  If eGFR remains <30 this check discussed with patient will need to discontinue Metformin or if above >30 but still low then will reduce Metformin to 500 MG BID, consider addition of GLP 1, discussed with patient and her daughter.  Continue Glimepiride and Jardiance at this time, will place [facility] referral for assist with SGLT2.  Check BS TID and focus on diabetic diet.   Return in 2 weeks for diabetic regimen chat and will consider GLP1 sample in office to initiate, which will allow for lowering or discontinuing of Metformin -- will need assist from [facility] for cost if continue alternate regimen.

## 2020-12-03 NOTE — Assessment & Plan Note (Signed)
Chronic, ongoing.  Continue current medication regimen and adjust as needed. Lipid panel today. 

## 2020-12-03 NOTE — Progress Notes (Signed)
New Patient Office Visit  Subjective:  Patient ID: Cindy [name], female    DOB: 1950-10-15  Age: 70 y.o. MRN: [id number]  CC:  Chief Complaint  Patient presents with   Establish Care    Patient states she was hospitalized in February. Patient states she was recently hospitalized and having similar symptoms of COVID which she had in February, but patient got her first vaccine last 11-18-2020.    Diabetes    I requested Diabetic Eye Exam at today's visit.     HPI Cindy [name] presents for new patient visit to establish care.  Introduced to Designer, jewellery role and practice setting.  All questions answered.  Discussed provider/patient relationship and expectations.  Was going to [facility] in [place] and her PCP is no longer there.    Has history of Covid with pneumonia in February and was hospitalized -- has ongoing SOB and loss of taste with this.    She was recently treated on 11/19/20 for sinus infection and boils to abdominal area with Doxycycline and Keflex.  Doxycycline is completed and Keflex has one pill left.  She is currently being followed by wound clinic for the boils -- returns to them tomorrow -- needs to return every week for one month.  She has seen a dermatologist for frequent boils and is using Hibiclens as needed + a cream.    DIABETES A1c in April was 8.2% with previous PCP.  Taking Metformin and Glimepiride. Was ordered Jardiance by PCP recently, but was very expensive -- only got a month worth which her daughter purchased for her.   Hypoglycemic episodes:no Polydipsia/polyuria: no Visual disturbance: no Chest pain: no Paresthesias: no Glucose Monitoring: yes  Accucheck frequency: Daily  Fasting glucose: 84 to 220  Post prandial:  Evening:  Before meals: Taking Insulin?: no  Long acting insulin:  Short acting insulin: Blood Pressure Monitoring: a few times a week Retinal Examination: Up to Date -- [facility] Foot Exam: Not up to Date   Diabetic Education: Completed Pneumovax: Not up to Date Influenza: Not up to Date Aspirin: no   HYPERTENSION / HYPERLIPIDEMIA/A-FIB Currently followed by cardiology and last saw 10/08/20, they are monitoring her ongoing SOB post Covid in February.  Continues on Eliquis, Atorvastatin, Diltiazem, Lasix, Lisinopril, Metoprolol, Spironolactone, and Minoxidil.  Does endorse difficulty with cost of Eliquis.  Discussed CCM team in office with her.  Has recent echo with EF 60%.  Aortic atherosclerosis noted on 07/15/20 imaging. Satisfied with current treatment? yes Duration of hypertension: chronic BP monitoring frequency: a few times a week BP range: 120-130/70-80 BP medication side effects: no Duration of hyperlipidemia: chronic Cholesterol medication side effects: no Cholesterol supplements: none Medication compliance: good compliance Aspirin: no Recent stressors: no Recurrent headaches: no Visual changes: no Palpitations: no Dyspnea: worsening after Covid Chest pain: no Lower extremity edema: no Dizzy/lightheaded: no   CHRONIC KIDNEY DISEASE Labs on 11/19/20 = H/H 9.6/29.4, NA+ 132, CRT 1.99, GFR 27 -- recheck CRP 2.2.  In October last year GFR had been >60. CKD status: controlled Medications renally dose:  discussed with patient Previous renal evaluation: no Pneumovax:  Not up to Date Influenza Vaccine:  Not up to Date  DEPRESSION Continues on Duloxetine 60 MG daily for mood and neuropathy. Stays in room a lot post Covid. Mood status: stable Satisfied with current treatment?: yes Symptom severity: moderate  Duration of current treatment : chronic Side effects: no Medication compliance: good compliance Psychotherapy/counseling: none Depressed mood: yes  Anxious mood: no Anhedonia: no Significant weight loss or gain: no Insomnia: yes hard to fall asleep Fatigue: yes Feelings of worthlessness or guilt: no Impaired concentration/indecisiveness: no Suicidal ideations:  no Hopelessness: no Crying spells: no Depression screen [facility] 2/9 12/03/2020 12/03/2020  Decreased Interest 1 1  Down, Depressed, Hopeless 1 1  PHQ - 2 Score 2 2  Altered sleeping 2 2  Tired, decreased energy 1 2  Change in appetite 2 -  Feeling bad or failure about yourself  0 0  Trouble concentrating 0 0  Moving slowly or fidgety/restless 0 0  Suicidal thoughts 0 0  PHQ-9 Score 7 6  Difficult doing work/chores Somewhat difficult Somewhat difficult     Past Medical History:  Diagnosis Date   Chronic pain syndrome    Depression    Diabetes mellitus without complication (HCC)    GERD (gastroesophageal reflux disease)    History of hiatal hernia    Hypertension    Iron deficiency anemia    Microalbuminuric diabetic nephropathy (HCC)    Mitral regurgitation    Obesity    Shortness of breath dyspnea    Sleep apnea     Past Surgical History:  Procedure Laterality Date   ABDOMINAL HYSTERECTOMY     BACK SURGERY     BREAST BIOPSY Right 03/2003   CARDIAC CATHETERIZATION N/A 10/10/2014   Procedure: Left Heart Cath;  Surgeon: Isaias Cowman, MD;  Location: [facility];  Service: Cardiovascular;  Laterality: N/A;   CARDIAC CATHETERIZATION     carpal tunnell release  10/23/11   COLONOSCOPY WITH PROPOFOL N/A 02/20/2015   Procedure: COLONOSCOPY WITH PROPOFOL;  Surgeon: Hulen Luster, MD;  Location: [facility] ENDOSCOPY;  Service: Gastroenterology;  Laterality: N/A;   COLONOSCOPY WITH PROPOFOL N/A 04/26/2017   Procedure: COLONOSCOPY WITH PROPOFOL;  Surgeon: Toledo, Benay Pike, MD;  Location: [facility] ENDOSCOPY;  Service: Gastroenterology;  Laterality: N/A;   ESOPHAGOGASTRODUODENOSCOPY (EGD) WITH PROPOFOL N/A 02/20/2015   Procedure: ESOPHAGOGASTRODUODENOSCOPY (EGD) WITH PROPOFOL;  Surgeon: Hulen Luster, MD;  Location: [facility] ENDOSCOPY;  Service: Gastroenterology;  Laterality: N/A;   ESOPHAGOGASTRODUODENOSCOPY (EGD) WITH PROPOFOL N/A 04/26/2017   Procedure: ESOPHAGOGASTRODUODENOSCOPY (EGD) WITH  PROPOFOL;  Surgeon: Toledo, Benay Pike, MD;  Location: [facility] ENDOSCOPY;  Service: Gastroenterology;  Laterality: N/A;   HERNIA REPAIR     LAPAROSCOPIC OVARIAN CYSTECTOMY  08/2001   nissen fundoplycation  11/99   NOSE SURGERY      Family History  Problem Relation Age of Onset   Heart attack Mother    Hypertension Father    Stroke Father    Heart attack Brother     Social History   Socioeconomic History   Marital status: Married    Spouse name: Not on file   Number of children: Not on file   Years of education: Not on file   Highest education level: Not on file  Occupational History   Not on file  Tobacco Use   Smoking status: Never   Smokeless tobacco: Current    Types: Snuff  Vaping Use   Vaping Use: Never used  Substance and Sexual Activity   Alcohol use: No   Drug use: No   Sexual activity: Not Currently  Other Topics Concern   Not on file  Social History Narrative   Not on file   Social Determinants of Health   Financial Resource Strain: Medium Risk   Difficulty of Paying Living Expenses: Somewhat hard  Food Insecurity: No Food Insecurity   Worried About Running  Out of Food in the Last Year: Never true   Ran Out of Food in the Last Year: Never true  Transportation Needs: No Transportation Needs   Lack of Transportation (Medical): No   Lack of Transportation (Non-Medical): No  Physical Activity: Inactive   Days of Exercise per Week: 0 days   Minutes of Exercise per Session: 0 min  Stress: No Stress Concern Present   Feeling of Stress : Only a little  Social Connections: Moderately Integrated   Frequency of Communication with Friends and Family: More than three times a week   Frequency of Social Gatherings with Friends and Family: More than three times a week   Attends Religious Services: More than 4 times per year   Active Member of [employer] or Organizations: No   Attends Archivist Meetings: Never   Marital Status: Married  Human resources officer  Violence: Not At Risk   Fear of Current or Ex-Partner: No   Emotionally Abused: No   Physically Abused: No   Sexually Abused: No    ROS Review of Systems  Constitutional:  Positive for fatigue. Negative for activity change, appetite change, diaphoresis and fever.  Respiratory:  Positive for cough, shortness of breath and wheezing. Negative for chest tightness.   Cardiovascular:  Negative for chest pain, palpitations and leg swelling.  Gastrointestinal: Negative.   Endocrine: Negative for cold intolerance, heat intolerance, polydipsia, polyphagia and polyuria.  Skin:  Positive for wound.  Neurological: Negative.   Psychiatric/Behavioral:  Positive for sleep disturbance. Negative for decreased concentration, self-injury and suicidal ideas. The patient is not nervous/anxious.    Objective:   Today's Vitals: BP (!) 126/56   Pulse (!) 59   Temp 98.8 F (37.1 C) (Oral)   Ht '5\' 1"'  (1.549 m)   Wt 238 lb 12.8 oz (108.3 kg)   SpO2 96%   BMI 45.12 kg/m   Physical Exam Vitals and nursing note reviewed.  Constitutional:      General: She is awake. She is not in acute distress.    Appearance: She is well-developed and well-groomed. She is morbidly obese. She is not ill-appearing or toxic-appearing.  HENT:     Head: Normocephalic.     Right Ear: Hearing normal.     Left Ear: Hearing normal.  Eyes:     General: Lids are normal.        Right eye: No discharge.        Left eye: No discharge.     Conjunctiva/sclera: Conjunctivae normal.     Pupils: Pupils are equal, round, and reactive to light.  Neck:     Thyroid: No thyromegaly.     Vascular: No carotid bruit.  Cardiovascular:     Rate and Rhythm: Normal rate and regular rhythm.     Heart sounds: Murmur heard.  Systolic murmur is present with a grade of 2/6.    No gallop.  Pulmonary:     Effort: Pulmonary effort is normal. No accessory muscle usage or respiratory distress.     Breath sounds: Wheezing present. No decreased breath  sounds.     Comments: Occasional expiratory wheezes noted throughout. Abdominal:     General: Bowel sounds are normal. There is no distension.     Palpations: Abdomen is soft.  Musculoskeletal:     Cervical back: Normal range of motion and neck supple.     Right lower leg: No edema.     Left lower leg: No edema.  Lymphadenopathy:     Cervical:  No cervical adenopathy.  Skin:    General: Skin is warm and dry.  Neurological:     Mental Status: She is alert and oriented to person, place, and time.     Deep Tendon Reflexes: Reflexes are normal and symmetric.     Reflex Scores:      Brachioradialis reflexes are 2+ on the right side and 2+ on the left side.      Patellar reflexes are 2+ on the right side and 2+ on the left side. Psychiatric:        Attention and Perception: Attention normal.        Mood and Affect: Mood normal.        Speech: Speech normal.        Behavior: Behavior normal. Behavior is cooperative.        Thought Content: Thought content normal.    Assessment & Plan:   Problem List Items Addressed This Visit       Cardiovascular and Mediastinum   Hypertension associated with diabetes ([place])    Chronic, ongoing with BP below goal today on exam, could consider medication reduction in future due to age and good control.  At this time continue current regimen and collaboration with cardiology.  Recommend she monitor BP at least a few mornings a week at home and document.  DASH diet at home.  Labs today: CMP, TSH, lipid.  Return in 3 months.        Relevant Medications   empagliflozin (JARDIANCE) 10 MG TABS tablet   Other Relevant Orders   HgB A1c   TSH   Ambulatory referral to Nephrology   AMB Referral to [place]   A-fib [facility])    Chronic, ongoing, followed by cardiology.  Continue Eliquis and Metoprolol + collaboration with cardiology team.  Rate controlled on exam today.  Will place CCM referral to get assist with Eliquis costs.       Mitral  regurgitation    Mild mitral and tricuspid regurgitation noted on echo June 2022.  Continue collaboration with cardiology and current medication regimen.       Aortic atherosclerosis ([place])    Noted on 07/15/20 imaging.  At this time continue statin and Eliquis daily for prevention.           Endocrine   Type 2 diabetes mellitus with morbid obesity (HCC)    Chronic, ongoing with recent April A1c 8.2%, above goal.  Recheck CMP and CBC today + iron and B12 levels.  If eGFR remains <30 this check discussed with patient will need to discontinue Metformin or if above >30 but still low then will reduce Metformin to 500 MG BID, consider addition of GLP 1, discussed with patient and her daughter.  Continue Glimepiride and Jardiance at this time, will place CCM referral for assist with SGLT2.  Check BS TID and focus on diabetic diet.   Return in 2 weeks for diabetic regimen chat and will consider GLP1 sample in office to initiate, which will allow for lowering or discontinuing of Metformin -- will need assist from CCM for cost if continue alternate regimen.        Relevant Medications   empagliflozin (JARDIANCE) 10 MG TABS tablet   Other Relevant Orders   HgB A1c   Ambulatory referral to Nephrology   [facility]B Referral [facility]   Hyperlipidemia associated with type 2 diabetes mellitus (HCC)    Chronic, ongoing.  Continue current medication regimen and adjust as needed.  Lipid panel today.          Relevant Medications   empagliflozin (JARDIANCE) 10 MG TABS tablet   Other Relevant Orders   Comprehensive metabolic panel   Lipid Panel w/o Chol/HDL Ratio   CKD stage 4 due to type 2 diabetes mellitus ([place])    Ongoing, initially noted on labs during Covid and continues to be present.  Recheck CMP and CBC today + iron and B12 levels.  If eGFR remains <30 this check discussed with patient will need to discontinue Metformin or if above >30 but still low then will reduce Metformin to 500  MG BID, consider addition of GLP 1, discussed with patient and her daughter.  Continue Glimepiride and Jardiance at this time, will place CCM referral for assist with SGLT2.  Referral to nephrology placed after discussion with patient, for further work-up of ongoing kidney disease.  Urine ALB next visit.  Return in 3 months.       Relevant Medications   empagliflozin (JARDIANCE) 10 MG TABS tablet   Other Relevant Orders   HgB A1c   Comprehensive metabolic panel   Ambulatory referral to Nephrology   AMB Referral to [place]     Hematopoietic and Hemostatic   Other thrombophilia ([place])    Related to a-fib and long term Eliquis.  Monitor CBC regularly and monitor skin for bruising or breakdown, notify provider if present.         Other   Depression, major, single episode, moderate (HCC)    Chronic, ongoing.  Denies SI/HI.  Continue Duloxetine at this time, which also offers benefit to chronic pain.  Refills as needed.  Will adjust dosing or add medication as needed.  Return in 3 months.       Relevant Medications   DULoxetine (CYMBALTA) 60 MG capsule   Iron deficiency anemia    Ongoing, noted on labs post Covid.  Recheck CBC, iron/ferritin, and B12 today.  Consider FOBT in future if ongoing.  Suspect related to CKD.  Return in 2 weeks.       Relevant Orders   Vitamin B12   CBC with Differential/Platelet   Iron, TIBC and Ferritin Panel   Post-COVID syndrome    Covid with PNA in February 2022, having ongoing SOB and loss of taste + fatigue.  Followed by cardiology with recent EF 60%.  At this time will place referral to pulmonary for further lung testing, concern for new lung disease post-Covid.  Obtain CXR to further assess outpatient + use Albuterol inhaler as needed and Flonase spray + Singulair.  Check CBC and CMP + TSH today.  Return in 2 weeks for follow-up.         Relevant Orders   Ambulatory referral to Pulmonology   AMB Referral to [facility]   DG Chest 2 View   Recurrent boils    Continue collaboration with wound care at this time and current treatment as ordered by them.       Morbid obesity (HCC)    BMI 45.12 with T2DM, HTN, CKD.  Recommended eating smaller high protein, low fat meals more frequently and exercising 30 mins a day 5 times a week with a goal of 10-15lb weight loss in the next 3 months. Patient voiced their understanding and motivation to adhere to these recommendations.        Relevant Medications   empagliflozin (JARDIANCE) 10 MG TABS tablet   Other Visit Diagnoses     Encounter to  establish care    -  Primary       Outpatient Encounter Medications as of 12/03/2020  Medication Sig   acetaminophen (TYLENOL) 650 MG CR tablet Take 650 mg by mouth every 8 (eight) hours as needed for pain.   amLODipine (NORVASC) 10 MG tablet Take 10 mg by mouth daily.   apixaban (ELIQUIS) 5 MG TABS tablet Take 1 tablet (5 mg total) by mouth 2 (two) times daily.   atorvastatin (LIPITOR) 40 MG tablet Take 40 mg by mouth daily.   cephALEXin (KEFLEX) 500 MG capsule Take 1 capsule (500 mg total) by mouth 4 (four) times daily.   cetirizine (ZYRTEC) 10 MG tablet Take 10 mg by mouth daily.   diltiazem (CARDIZEM CD) 360 MG 24 hr capsule Take 360 mg by mouth daily.   DULoxetine (CYMBALTA) 60 MG capsule Take 60 mg by mouth daily.   empagliflozin (JARDIANCE) 10 MG TABS tablet Take 10 mg by mouth daily.   ferrous sulfate 325 (65 FE) MG tablet Take 1 tablet (325 mg total) by mouth 2 (two) times daily with a meal.   furosemide (LASIX) 80 MG tablet Take 80 mg by mouth 2 (two) times daily.   glimepiride (AMARYL) 2 MG tablet Take 2-4 mg by mouth 2 (two) times daily. Take 54m in the morning and 272min the evening   lisinopril (PRINIVIL,ZESTRIL) 40 MG tablet Take 40 mg by mouth daily.   metFORMIN (GLUCOPHAGE) 1000 MG tablet Take 1,000 mg by mouth 2 (two) times daily with a meal.   metoprolol succinate (TOPROL-XL) 100 MG 24 hr  tablet Take 50-100 mg by mouth 2 (two) times daily. Take 1 tablet in the morning and one-half tablet in the evening. Take with or immediately following a meal.   minoxidil (LONITEN) 10 MG tablet Take 10 mg by mouth daily.   montelukast (SINGULAIR) 10 MG tablet Take 10 mg by mouth daily as needed (allergies).    Multiple Vitamin (MULTIVITAMIN WITH MINERALS) TABS tablet Take 1 tablet by mouth daily.   omeprazole (PRILOSEC) 20 MG capsule Take 20 mg by mouth daily as needed (heartburn).    polyethylene glycol powder (GLYCOLAX/MIRALAX) powder Take 17 g by mouth daily.    spironolactone (ALDACTONE) 25 MG tablet Take 25 mg by mouth daily.   [DISCONTINUED] albuterol (VENTOLIN HFA) 108 (90 Base) MCG/ACT inhaler Inhale 1-2 puffs into the lungs every 6 (six) hours as needed for wheezing or shortness of breath.   [DISCONTINUED] fluticasone (FLONASE) 50 MCG/ACT nasal spray Place 2 sprays into both nostrils daily.   albuterol (VENTOLIN HFA) 108 (90 Base) MCG/ACT inhaler Inhale 1-2 puffs into the lungs every 6 (six) hours as needed for wheezing or shortness of breath.   fluticasone (FLONASE) 50 MCG/ACT nasal spray Place 2 sprays into both nostrils daily.   No facility-administered encounter medications on file as of 12/03/2020.    Follow-up: Return in about 2 weeks (around 12/17/2020) for Diabetes and Post Covid -- 40 minutes please.   JOVenita LickNP

## 2020-12-03 NOTE — Assessment & Plan Note (Signed)
Chronic, ongoing, followed by cardiology.  Continue Eliquis and Metoprolol + collaboration with cardiology team.  Rate controlled on exam today.  Will place CCM referral to get assist with Eliquis costs.

## 2020-12-03 NOTE — Assessment & Plan Note (Addendum)
Chronic, ongoing with BP below goal today on exam, could consider medication reduction in future due to age and good control.  At this time continue current regimen and collaboration with cardiology.  Recommend she monitor BP at least a few mornings a week at home and document.  DASH diet at home.  Labs today: CMP, TSH, lipid.  Return in 3 months.

## 2020-12-03 NOTE — Assessment & Plan Note (Signed)
Ongoing, noted on labs post Covid.  Recheck CBC, iron/ferritin, and B12 today.  Consider FOBT in future if ongoing.  Suspect related to CKD.  Return in 2 weeks.

## 2020-12-03 NOTE — Assessment & Plan Note (Signed)
Continue collaboration with wound care at this time and current treatment as ordered by them.

## 2020-12-03 NOTE — Assessment & Plan Note (Signed)
BMI 45.12 with T2DM, HTN, CKD.  Recommended eating smaller high protein, low fat meals more frequently and exercising 30 mins a day 5 times a week with a goal of 10-15lb weight loss in the next 3 months. Patient voiced their understanding and motivation to adhere to these recommendations.

## 2020-12-03 NOTE — Assessment & Plan Note (Signed)
Covid with PNA in February 2022, having ongoing SOB and loss of taste + fatigue.  Followed by cardiology with recent EF 60%.  At this time will place referral to pulmonary for further lung testing, concern for new lung disease post-Covid.  Obtain CXR to further assess outpatient + use Albuterol inhaler as needed and Flonase spray + Singulair.  Check CBC and CMP + TSH today.  Return in 2 weeks for follow-up.

## 2020-12-03 NOTE — Assessment & Plan Note (Signed)
Related to a-fib and long term Eliquis.  Monitor CBC regularly and monitor skin for bruising or breakdown, notify provider if present. 

## 2020-12-04 ENCOUNTER — Encounter: Payer: Medicare HMO | Attending: Physician Assistant | Admitting: Physician Assistant

## 2020-12-04 DIAGNOSIS — L03311 Cellulitis of abdominal wall: Secondary | ICD-10-CM | POA: Insufficient documentation

## 2020-12-04 DIAGNOSIS — L98492 Non-pressure chronic ulcer of skin of other sites with fat layer exposed: Secondary | ICD-10-CM | POA: Insufficient documentation

## 2020-12-04 DIAGNOSIS — Z7901 Long term (current) use of anticoagulants: Secondary | ICD-10-CM | POA: Insufficient documentation

## 2020-12-04 DIAGNOSIS — I10 Essential (primary) hypertension: Secondary | ICD-10-CM | POA: Diagnosis not present

## 2020-12-04 DIAGNOSIS — I480 Paroxysmal atrial fibrillation: Secondary | ICD-10-CM | POA: Insufficient documentation

## 2020-12-04 LAB — COMPREHENSIVE METABOLIC PANEL
ALT: 16 IU/L (ref 0–32)
AST: 17 IU/L (ref 0–40)
Albumin/Globulin Ratio: 1.7 (ref 1.2–2.2)
Albumin: 4.7 g/dL (ref 3.8–4.8)
Alkaline Phosphatase: 76 IU/L (ref 44–121)
BUN/Creatinine Ratio: 20 (ref 12–28)
BUN: 32 mg/dL — ABNORMAL HIGH (ref 8–27)
Bilirubin Total: 0.3 mg/dL (ref 0.0–1.2)
CO2: 23 mmol/L (ref 20–29)
Calcium: 10 mg/dL (ref 8.7–10.3)
Chloride: 99 mmol/L (ref 96–106)
Creatinine, Ser: 1.61 mg/dL — ABNORMAL HIGH (ref 0.57–1.00)
Globulin, Total: 2.7 g/dL (ref 1.5–4.5)
Glucose: 80 mg/dL (ref 65–99)
Potassium: 4.9 mmol/L (ref 3.5–5.2)
Sodium: 140 mmol/L (ref 134–144)
Total Protein: 7.4 g/dL (ref 6.0–8.5)
eGFR: 34 mL/min/{1.73_m2} — ABNORMAL LOW (ref >59)

## 2020-12-04 LAB — CBC WITH DIFFERENTIAL/PLATELET
Basophils Absolute: 0 10*3/uL (ref 0.0–0.2)
Basos: 1 %
EOS (ABSOLUTE): 0.2 10*3/uL (ref 0.0–0.4)
Eos: 3 %
Hematocrit: 32.2 % — ABNORMAL LOW (ref 34.0–46.6)
Hemoglobin: 10.3 g/dL — ABNORMAL LOW (ref 11.1–15.9)
Immature Grans (Abs): 0.1 10*3/uL (ref 0.0–0.1)
Immature Granulocytes: 1 %
Lymphocytes Absolute: 1.3 10*3/uL (ref 0.7–3.1)
Lymphs: 22 %
MCH: 27.8 pg (ref 26.6–33.0)
MCHC: 32 g/dL (ref 31.5–35.7)
MCV: 87 fL (ref 79–97)
Monocytes Absolute: 0.5 10*3/uL (ref 0.1–0.9)
Monocytes: 9 %
Neutrophils Absolute: 3.8 10*3/uL (ref 1.4–7.0)
Neutrophils: 64 %
Platelets: 314 10*3/uL (ref 150–450)
RBC: 3.7 x10E6/uL — ABNORMAL LOW (ref 3.77–5.28)
RDW: 13.7 % (ref 11.7–15.4)
WBC: 5.8 10*3/uL (ref 3.4–10.8)

## 2020-12-04 LAB — VITAMIN B12: Vitamin B-12: 1128 pg/mL (ref 232–1245)

## 2020-12-04 LAB — LIPID PANEL W/O CHOL/HDL RATIO
Cholesterol, Total: 152 mg/dL (ref 100–199)
HDL: 43 mg/dL (ref >39)
LDL Chol Calc (NIH): 87 mg/dL (ref 0–99)
Triglycerides: 123 mg/dL (ref 0–149)
VLDL Cholesterol Cal: 22 mg/dL (ref 5–40)

## 2020-12-04 LAB — TSH: TSH: 1.4 u[IU]/mL (ref 0.450–4.500)

## 2020-12-04 LAB — IRON,TIBC AND FERRITIN PANEL
Ferritin: 63 ng/mL (ref 15–150)
Iron Saturation: 16 % (ref 15–55)
Iron: 59 ug/dL (ref 27–139)
Total Iron Binding Capacity: 379 ug/dL (ref 250–450)
UIBC: 320 ug/dL (ref 118–369)

## 2020-12-04 LAB — HEMOGLOBIN A1C
Est. average glucose Bld gHb Est-mCnc: 171 mg/dL
Hgb A1c MFr Bld: 7.6 % — ABNORMAL HIGH (ref 4.8–5.6)

## 2020-12-04 NOTE — Progress Notes (Signed)
Good morning, please let Cindy [name] know her labs have returned.  For now I want her to continue all medications as we discussed and at upcoming visit we will discuss reducing Metformin to 500 MG twice a day and starting something like the injectable I discussed to help keep kidneys safe, as labs continue to show kidney disease stage 3b.  Her A1c is coming down a little, now 7.6%, but goal is less then 7%.  We will work on this with medication changes upcoming.  She continues to show some anemia, but improving.  Most likely from kidney disease, continue iron supplement daily.  We will discuss all more next visit.  Any questions? Keep being awesome!!  Thank you for allowing me to participate in your care.  I appreciate you. Kindest regards, Mercer Stallworth

## 2020-12-04 NOTE — Progress Notes (Addendum)
Cindy, [name] ([id number]) Visit Report for 12/04/2020 Chief Complaint Document Details Patient Name: Cindy [name], Cindy [name]. Date of Service: 12/04/2020 12:30 PM Medical Record Number: [id number] Patient Account Number: [id number] Date of Birth/Sex: 02-May-1951 (70 y.o. F) Treating RN: Dolan Amen Primary Care Provider: Marnee Guarneri Other Clinician: Referring Provider: Thereasa Distance Treating Provider/Extender: Skipper Cliche in Treatment: 1 Information Obtained from: Patient Chief Complaint Abdominal Ulcer Electronic Signature(s) Signed: 12/04/2020 1:14:51 PM By: Worthy Keeler PA-C Entered By: Worthy Keeler on 12/04/2020 13:14:50 Bethard, Tessie J. ([id number]) -------------------------------------------------------------------------------- HPI Details Patient Name: Cindy [name]. Date of Service: 12/04/2020 12:30 PM Medical Record Number: [id number] Patient Account Number: [id number] Date of Birth/Sex: 1951/05/20 (70 y.o. F) Treating RN: Dolan Amen Primary Care Provider: Marnee Guarneri Other Clinician: Referring Provider: Thereasa Distance Treating Provider/Extender: Skipper Cliche in Treatment: 1 History of Present Illness HPI Description: 11/27/2020 upon evaluation today patient presents today for initial inspection here in our clinic concerning issues that she has been having with a wound over the left abdominal wall. Subsequently this is actually something that to be honest appears to have potentially been a infection type issue. Subsequently the patient states that she has been having spots pop up all over and none of them have been quite as large as what this is currently. Nonetheless she tells me that she has been try to manage these as much as possible on her own she has been using Hibiclens to wash them with but again not washing head to toe necessarily. Fortunately there does not appear to be any signs of overall systemic infection and even locally I do not see  anything obvious at the moment. She is on doxycycline and has been using triamcinolone as given to her by Dr. Phillip Heal who is a local dermatologist. This wound in particular has been present for about 2 months although she has been having the issue for quite some time. Patient does have a history of hypertension, atrial fibrillation, and long-term use of anticoagulant therapy, 12/04/2020 upon evaluation today patient appears to be doing well with regard to her wound. She has been tolerating the dressing changes without complication. Fortunately there is no signs of active infection which is great news and overall very pleased with where things stand today. I think that the use of the mupirocin as well as the Hibiclens washes has been helpful as well she has had no new areas pop up. Electronic Signature(s) Signed: 12/04/2020 1:30:07 PM By: Worthy Keeler PA-C Entered By: Worthy Keeler on 12/04/2020 13:30:07 Sermeno, Belkis J. ([id number]) -------------------------------------------------------------------------------- Physical Exam Details Patient Name: Cindy, Dwan [name]. Date of Service: 12/04/2020 12:30 PM Medical Record Number: [id number] Patient Account Number: [id number] Date of Birth/Sex: April 19, 1951 (70 y.o. F) Treating RN: Dolan Amen Primary Care Provider: Marnee Guarneri Other Clinician: Referring Provider: Thereasa Distance Treating Provider/Extender: Skipper Cliche in Treatment: 1 Constitutional Obese and well-hydrated in no acute distress. Respiratory normal breathing without difficulty. Psychiatric this patient is able to make decisions and demonstrates good insight into disease process. Alert and Oriented x 3. pleasant and cooperative. Notes Upon inspection patient's wound again showed signs of good granulation epithelization at this point. I do not see any signs of active infection which is great news and overall very pleased with where things stand today. No fevers, chills,  nausea, vomiting, or diarrhea. Electronic Signature(s) Signed: 12/04/2020 1:30:24 PM By: Worthy Keeler PA-C Entered By: Worthy Keeler on 12/04/2020 13:30:24 Benito, Allysia J. ([id number]) -------------------------------------------------------------------------------- Physician Orders  Details Patient Name: Cindy, [name]. Date of Service: 12/04/2020 12:30 PM Medical Record Number: [id number] Patient Account Number: [id number] Date of Birth/Sex: 10/06/1950 (70 y.o. F) Treating RN: Dolan Amen Primary Care Provider: Marnee Guarneri Other Clinician: Referring Provider: Thereasa Distance Treating Provider/Extender: Skipper Cliche in Treatment: 1 Verbal / Phone Orders: No Diagnosis Coding ICD-10 Coding Code Description L03.311 Cellulitis of abdominal wall L98.492 Non-pressure chronic ulcer of skin of other sites with fat layer exposed I10 Essential (primary) hypertension I48.0 Paroxysmal atrial fibrillation Z79.01 Long term (current) use of anticoagulants Follow-up Appointments o Return Appointment in 1 week. Bathing/ Shower/ Hygiene o May shower; gently cleanse wound with antibacterial soap, rinse and pat dry prior to dressing wounds Additional Orders / Instructions o Other: - Start Mupirocin regimen-instructions on prescription Wound Treatment Wound #1 - Abdomen - Lower Quadrant Wound Laterality: Left Cleanser: Soap and Water 1 x Per Day/15 Days Discharge Instructions: Gently cleanse wound with antibacterial soap, rinse and pat dry prior to dressing wounds Primary Dressing: Xeroform-HBD 2x2 (in/in) (Generic) 1 x Per Day/15 Days Discharge Instructions: Apply Xeroform-HBD 2x2 (in/in) as directed Secondary Dressing: Mepilex Border Flex, 4x4 (in/in) (Generic) 1 x Per Day/15 Days Discharge Instructions: Apply to wound as directed. Do not cut. Electronic Signature(s) Signed: 12/04/2020 3:34:00 PM By: Dolan Amen RN Signed: 12/04/2020 5:08:48 PM By: Worthy Keeler  PA-C Entered By: Dolan Amen on 12/04/2020 13:23:03 Forde, Mickelle J. ([id number]) -------------------------------------------------------------------------------- Problem List Details Patient Name: Cindy, [name]. Date of Service: 12/04/2020 12:30 PM Medical Record Number: [id number] Patient Account Number: [id number] Date of Birth/Sex: December 23, 1950 (70 y.o. F) Treating RN: Dolan Amen Primary Care Provider: Marnee Guarneri Other Clinician: Referring Provider: Thereasa Distance Treating Provider/Extender: Skipper Cliche in Treatment: 1 Active Problems ICD-10 Encounter Code Description Active Date MDM Diagnosis L03.311 Cellulitis of abdominal wall 11/27/2020 No Yes L98.492 Non-pressure chronic ulcer of skin of other sites with fat layer exposed 11/27/2020 No Yes I10 Essential (primary) hypertension 11/27/2020 No Yes I48.0 Paroxysmal atrial fibrillation 11/27/2020 No Yes Z79.01 Long term (current) use of anticoagulants 11/27/2020 No Yes Inactive Problems Resolved Problems Electronic Signature(s) Signed: 12/04/2020 1:14:42 PM By: Worthy Keeler PA-C Entered By: Worthy Keeler on 12/04/2020 13:14:42 Mancias, Jora J. ([id number]) -------------------------------------------------------------------------------- Progress Note Details Patient Name: Cindy [name]. Date of Service: 12/04/2020 12:30 PM Medical Record Number: [id number] Patient Account Number: [id number] Date of Birth/Sex: [birth year] (70 y.o. F) Treating RN: Dolan Amen Primary Care Provider: Marnee Guarneri Other Clinician: Referring Provider: Thereasa Distance Treating Provider/Extender: Skipper Cliche in Treatment: 1 Subjective Chief Complaint Information obtained from Patient Abdominal Ulcer History of Present Illness (HPI) 11/27/2020 upon evaluation today patient presents today for initial inspection here in our clinic concerning issues that she has been having with a wound over the left abdominal wall.  Subsequently this is actually something that to be honest appears to have potentially been a infection type issue. Subsequently the patient states that she has been having spots pop up all over and none of them have been quite as large as what this is currently. Nonetheless she tells me that she has been try to manage these as much as possible on her own she has been using Hibiclens to wash them with but again not washing head to toe necessarily. Fortunately there does not appear to be any signs of overall systemic infection and even locally I do not see anything obvious at the moment. She is on doxycycline and has been using triamcinolone as given to  her by Dr. Phillip Heal who is a local dermatologist. This wound in particular has been present for about 2 months although she has been having the issue for quite some time. Patient does have a history of hypertension, atrial fibrillation, and long-term use of anticoagulant therapy, 12/04/2020 upon evaluation today patient appears to be doing well with regard to her wound. She has been tolerating the dressing changes without complication. Fortunately there is no signs of active infection which is great news and overall very pleased with where things stand today. I think that the use of the mupirocin as well as the Hibiclens washes has been helpful as well she has had no new areas pop up. Objective Constitutional Obese and well-hydrated in no acute distress. Vitals Time Taken: 1:02 PM, Height: 60 in, Weight: 242 lbs, BMI: 47.3, Temperature: 98.8 F, Pulse: 73 bpm, Respiratory Rate: 18 breaths/min, Blood Pressure: 136/73 mmHg. Respiratory normal breathing without difficulty. Psychiatric this patient is able to make decisions and demonstrates good insight into disease process. Alert and Oriented x 3. pleasant and cooperative. General Notes: Upon inspection patient's wound again showed signs of good granulation epithelization at this point. I do not see any  signs of active infection which is great news and overall very pleased with where things stand today. No fevers, chills, nausea, vomiting, or diarrhea. Integumentary (Hair, Skin) Wound #1 status is Open. Original cause of wound was Gradually Appeared. The date acquired was: 08/29/2020. The wound has been in treatment 1 weeks. The wound is located on the Left Abdomen - Lower Quadrant. The wound measures 1.7cm length x 2.6cm width x 0.1cm depth; 3.471cm^2 area and 0.347cm^3 volume. There is Fat Layer (Subcutaneous Tissue) exposed. There is no tunneling or undermining noted. There is a medium amount of serosanguineous drainage noted. There is large (67-100%) red granulation within the wound bed. There is a small (1-33%) amount of necrotic tissue within the wound bed including Adherent Slough. Assessment Active Problems ICD-10 ELVERNA, CAFFEE. ([id number]) Cellulitis of abdominal wall Non-pressure chronic ulcer of skin of other sites with fat layer exposed Essential (primary) hypertension Paroxysmal atrial fibrillation Long term (current) use of anticoagulants Plan Follow-up Appointments: Return Appointment in 1 week. Bathing/ Shower/ Hygiene: May shower; gently cleanse wound with antibacterial soap, rinse and pat dry prior to dressing wounds Additional Orders / Instructions: Other: - Start Mupirocin regimen-instructions on prescription WOUND #1: - Abdomen - Lower Quadrant Wound Laterality: Left Cleanser: Soap and Water 1 x Per Day/15 Days Discharge Instructions: Gently cleanse wound with antibacterial soap, rinse and pat dry prior to dressing wounds Primary Dressing: Xeroform-HBD 2x2 (in/in) (Generic) 1 x Per Day/15 Days Discharge Instructions: Apply Xeroform-HBD 2x2 (in/in) as directed Secondary Dressing: Mepilex Border Flex, 4x4 (in/in) (Generic) 1 x Per Day/15 Days Discharge Instructions: Apply to wound as directed. Do not cut. 1. Would recommend that we going continue with wound care  measures as before and the patient is in agreement with plan. This includes the use of the Xeroform gauze dressing which I think is doing a great job at this point. I am very pleased with that. 2. I am also can recommend that we continue with the border foam dressing which I think is also doing a great job. 3. I am also can recommend the patient continue to monitor for any signs of worsening from the standpoint of infection. We will see patient back for reevaluation in 1 week here in the clinic. If anything worsens or changes patient will contact our office for  additional recommendations. Electronic Signature(s) Signed: 12/04/2020 1:31:10 PM By: Worthy Keeler PA-C Entered By: Worthy Keeler on 12/04/2020 13:31:09 Jiminez, Bevelyn J. ([id number]) -------------------------------------------------------------------------------- SuperBill Details Patient Name: Cindy [name]. Date of Service: 12/04/2020 Medical Record Number: [id number] Patient Account Number: [id number] Date of Birth/Sex: 06/07/1950 (70 y.o. F) Treating RN: Dolan Amen Primary Care Provider: Marnee Guarneri Other Clinician: Referring Provider: Thereasa Distance Treating Provider/Extender: Skipper Cliche in Treatment: 1 Diagnosis Coding ICD-10 Codes Code Description E10.071 Cellulitis of abdominal wall L98.492 Non-pressure chronic ulcer of skin of other sites with fat layer exposed I10 Essential (primary) hypertension I48.0 Paroxysmal atrial fibrillation Z79.01 Long term (current) use of anticoagulants Facility Procedures CPT4 Code: 21975883 Description: 99213 - WOUND CARE VISIT-LEV 3 EST PT Modifier: Quantity: 1 Physician Procedures CPT4 Code: 2549826 Description: 41583 - WC PHYS LEVEL 4 - EST PT Modifier: Quantity: 1 CPT4 Code: Description: ICD-10 Diagnosis Description E94.076 Cellulitis of abdominal wall L98.492 Non-pressure chronic ulcer of skin of other sites with fat layer expos I10 Essential (primary)  hypertension I48.0 Paroxysmal atrial fibrillation Modifier: ed Quantity: Electronic Signature(s) Signed: 12/04/2020 1:39:12 PM By: Worthy Keeler PA-C Entered By: Worthy Keeler on 12/04/2020 13:39:12

## 2020-12-04 NOTE — Progress Notes (Signed)
Cindy [name], Cindy [name] ([id number]) Visit Report for 12/04/2020 Arrival Information Details Patient Name: Cindy [name], Cindy [name]. Date of Service: 12/04/2020 12:30 PM Medical Record Number: [id number] Patient Account Number: [id number] Date of Birth/Sex: 1951/01/10 (70 y.o. F) Treating RN: Dolan Amen Primary Care Zorah Backes: Marnee Guarneri Other Clinician: Referring Canuto Kingston: Thereasa Distance Treating Oveda Dadamo/Extender: Skipper Cliche in Treatment: 1 Visit Information History Since Last Visit Has Dressing in Place as Prescribed: Yes Patient Arrived: Walker Pain Present Now: No Arrival Time: 13:01 Accompanied By: daughter Transfer Assistance: Manual Patient Identification Verified: Yes Secondary Verification Process Completed: Yes Patient Requires Transmission-Based Precautions: No Patient Has Alerts: No Electronic Signature(s) Signed: 12/04/2020 3:34:00 PM By: Dolan Amen RN Entered By: Dolan Amen on 12/04/2020 13:02:03 [name], [name] ([id number]) -------------------------------------------------------------------------------- Clinic Level of Care Assessment Details Patient Name: Cindy [name]. Date of Service: 12/04/2020 12:30 PM Medical Record Number: [id number] Patient Account Number: [id number] Date of Birth/Sex: Sep 17, 1950 (70 y.o. F) Treating RN: Dolan Amen Primary Care Auston Halfmann: Marnee Guarneri Other Clinician: Referring Dakoda Bassette: Thereasa Distance Treating Alaynah Schutter/Extender: Skipper Cliche in Treatment: 1 Clinic Level of Care Assessment Items TOOL 4 Quantity Score X - Use when only an EandM is performed on FOLLOW-UP visit 1 0 ASSESSMENTS - Nursing Assessment / Reassessment X - Reassessment of Co-morbidities (includes updates in patient status) 1 10 X- 1 5 Reassessment of Adherence to Treatment Plan ASSESSMENTS - Wound and Skin Assessment / Reassessment X - Simple Wound Assessment / Reassessment - one wound 1 5 []  - 0 Complex Wound Assessment / Reassessment -  multiple wounds []  - 0 Dermatologic / Skin Assessment (not related to wound area) ASSESSMENTS - Focused Assessment []  - Circumferential Edema Measurements - multi extremities 0 []  - 0 Nutritional Assessment / Counseling / Intervention []  - 0 Lower Extremity Assessment (monofilament, tuning fork, pulses) []  - 0 Peripheral Arterial Disease Assessment (using hand held doppler) ASSESSMENTS - Ostomy and/or Continence Assessment and Care []  - Incontinence Assessment and Management 0 []  - 0 Ostomy Care Assessment and Management (repouching, etc.) PROCESS - Coordination of Care X - Simple Patient / Family Education for ongoing care 1 15 []  - 0 Complex (extensive) Patient / Family Education for ongoing care []  - 0 Staff obtains Programmer, systems, Records, Test Results / Process Orders []  - 0 Staff telephones HHA, Nursing Homes / Clarify orders / etc []  - 0 Routine Transfer to another Facility (non-emergent condition) []  - 0 Routine Hospital Admission (non-emergent condition) []  - 0 New Admissions / Biomedical engineer / Ordering NPWT, Apligraf, etc. []  - 0 Emergency Hospital Admission (emergent condition) X- 1 10 Simple Discharge Coordination []  - 0 Complex (extensive) Discharge Coordination PROCESS - Special Needs []  - Pediatric / Minor Patient Management 0 []  - 0 Isolation Patient Management []  - 0 Hearing / Language / Visual special needs []  - 0 Assessment of Community assistance (transportation, D/C planning, etc.) []  - 0 Additional assistance / Altered mentation []  - 0 Support Surface(s) Assessment (bed, cushion, seat, etc.) INTERVENTIONS - Wound Cleansing / Measurement [name], [name] ([id number]) X- 1 5 Simple Wound Cleansing - one wound []  - 0 Complex Wound Cleansing - multiple wounds X- 1 5 Wound Imaging (photographs - any number of wounds) []  - 0 Wound Tracing (instead of photographs) X- 1 5 Simple Wound Measurement - one wound []  - 0 Complex Wound  Measurement - multiple wounds INTERVENTIONS - Wound Dressings []  - Small Wound Dressing one or multiple wounds 0 X- 1 15 Medium Wound Dressing one or  multiple wounds []  - 0 Large Wound Dressing one or multiple wounds []  - 0 Application of Medications - topical []  - 0 Application of Medications - injection INTERVENTIONS - Miscellaneous []  - External ear exam 0 []  - 0 Specimen Collection (cultures, biopsies, blood, body fluids, etc.) []  - 0 Specimen(s) / Culture(s) sent or taken to Lab for analysis []  - 0 Patient Transfer (multiple staff / Civil Service fast streamer / Similar devices) []  - 0 Simple Staple / Suture removal (25 or less) []  - 0 Complex Staple / Suture removal (26 or more) []  - 0 Hypo / Hyperglycemic Management (close monitor of Blood Glucose) []  - 0 Ankle / Brachial Index (ABI) - do not check if billed separately X- 1 5 Vital Signs Has the patient been seen at the hospital within the last three years: Yes Total Score: 80 Level Of Care: New/Established - Level 3 Electronic Signature(s) Signed: 12/04/2020 3:34:00 PM By: Dolan Amen RN Entered By: Dolan Amen on 12/04/2020 13:23:22 [name], [name] ([id number]) -------------------------------------------------------------------------------- Encounter Discharge Information Details Patient Name: Cindy [name]. Date of Service: 12/04/2020 12:30 PM Medical Record Number: [id number] Patient Account Number: [id number] Date of Birth/Sex: 05/22/1951 (70 y.o. F) Treating RN: Dolan Amen Primary Care Venba Zenner: Marnee Guarneri Other Clinician: Referring Kinesha Auten: Thereasa Distance Treating Nicolas Sisler/Extender: Skipper Cliche in Treatment: 1 Encounter Discharge Information Items Discharge Condition: Stable Ambulatory Status: Wheelchair Discharge Destination: Home Transportation: Private Auto Accompanied By: daughter Schedule Follow-up Appointment: Yes Clinical Summary of Care: Electronic Signature(s) Signed: 12/04/2020  3:34:00 PM By: Dolan Amen RN Entered By: Dolan Amen on 12/04/2020 13:24:01 [name], [name] ([id number]) -------------------------------------------------------------------------------- Lower Extremity Assessment Details Patient Name: Cindy [name]. Date of Service: 12/04/2020 12:30 PM Medical Record Number: [id number] Patient Account Number: [id number] Date of Birth/Sex: 06/28/1950 (70 y.o. F) Treating RN: Dolan Amen Primary Care Nastassja Witkop: Marnee Guarneri Other Clinician: Referring Jairy Angulo: Thereasa Distance Treating Derrious Bologna/Extender: Jeri Cos Weeks in Treatment: 1 Electronic Signature(s) Signed: 12/04/2020 3:34:00 PM By: Dolan Amen RN Entered By: Dolan Amen on 12/04/2020 13:11:42 Nowotny, Zavannah J. ([id number]) -------------------------------------------------------------------------------- Multi Wound Chart Details Patient Name: Cindy [name]. Date of Service: 12/04/2020 12:30 PM Medical Record Number: [id number] Patient Account Number: [id number] Date of Birth/Sex: 1951/04/04 (70 y.o. F) Treating RN: Dolan Amen Primary Care Florence Yeung: Marnee Guarneri Other Clinician: Referring Diannia Hogenson: Thereasa Distance Treating Erinn Huskins/Extender: Skipper Cliche in Treatment: 1 Vital Signs Height(in): 60 Pulse(bpm): 73 Weight(lbs): 242 Blood Pressure(mmHg): 136/73 Body Mass Index(BMI): 47 Temperature(F): 98.8 Respiratory Rate(breaths/min): 18 Photos: [N/A:N/A] Wound Location: Left Abdomen - Lower Quadrant N/A N/A Wounding Event: Gradually Appeared N/A N/A Primary Etiology: Inflammatory N/A N/A Comorbid History: Hypertension, Type II Diabetes N/A N/A Date Acquired: 08/29/2020 N/A N/A Weeks of Treatment: 1 N/A N/A Wound Status: Open N/A N/A Measurements L x W x D (cm) 1.7x2.6x0.1 N/A N/A Area (cm) : 3.471 N/A N/A Volume (cm) : 0.347 N/A N/A % Reduction in Area: 42.60% N/A N/A % Reduction in Volume: 42.60% N/A N/A Classification: Full Thickness Without  Exposed N/A N/A Support Structures Exudate Amount: Medium N/A N/A Exudate Type: Serosanguineous N/A N/A Exudate Color: red, brown N/A N/A Granulation Amount: Large (67-100%) N/A N/A Granulation Quality: Red N/A N/A Necrotic Amount: Small (1-33%) N/A N/A Exposed Structures: Fat Layer (Subcutaneous Tissue): N/A N/A Yes Fascia: No Tendon: No Muscle: No Joint: No Bone: No Epithelialization: Small (1-33%) N/A N/A Treatment Notes Electronic Signature(s) Signed: 12/04/2020 3:34:00 PM By: Dolan Amen RN Entered By: Dolan Amen on 12/04/2020  13:22:02 SAHER, DAVEE ([id number]) -------------------------------------------------------------------------------- Multi-Disciplinary Care Plan Details Patient Name: ISOBELLE, TUCKETT. Date of Service: 12/04/2020 12:30 PM Medical Record Number: [id number] Patient Account Number: [id number] Date of Birth/Sex: Sep 07, 1950 (70 y.o. F) Treating RN: Dolan Amen Primary Care Niharika Savino: Marnee Guarneri Other Clinician: Referring Santos Hardwick: Thereasa Distance Treating Tinia Oravec/Extender: Skipper Cliche in Treatment: 1 Active Inactive Wound/Skin Impairment Nursing Diagnoses: Impaired tissue integrity Goals: Patient/caregiver will verbalize understanding of skin care regimen Date Initiated: 11/27/2020 Target Resolution Date: 11/27/2020 Goal Status: Active Ulcer/skin breakdown will have a volume reduction of 30% by week 4 Date Initiated: 11/27/2020 Target Resolution Date: 12/27/2020 Goal Status: Active Ulcer/skin breakdown will have a volume reduction of 50% by week 8 Date Initiated: 11/27/2020 Target Resolution Date: 01/27/2021 Goal Status: Active Ulcer/skin breakdown will have a volume reduction of 80% by week 12 Date Initiated: 11/27/2020 Target Resolution Date: 02/27/2021 Goal Status: Active Ulcer/skin breakdown will heal within 14 weeks Date Initiated: 11/27/2020 Target Resolution Date: 03/29/2021 Goal Status: Active Interventions: Assess  patient/caregiver ability to obtain necessary supplies Assess patient/caregiver ability to perform ulcer/skin care regimen upon admission and as needed Assess ulceration(s) every visit Provide education on ulcer and skin care Treatment Activities: Referred to DME Srihith Aquilino for dressing supplies : 11/27/2020 Skin care regimen initiated : 11/27/2020 Notes: Electronic Signature(s) Signed: 12/04/2020 3:34:00 PM By: Dolan Amen RN Entered By: Dolan Amen on 12/04/2020 13:21:56 Rampersad, Camela J. ([id number]) -------------------------------------------------------------------------------- Pain Assessment Details Patient Name: Cindy [name]. Date of Service: 12/04/2020 12:30 PM Medical Record Number: [id number] Patient Account Number: [id number] Date of Birth/Sex: [birth year] (70 y.o. F) Treating RN: Dolan Amen Primary Care Ulisses Vondrak: Marnee Guarneri Other Clinician: Referring Chanson Teems: Thereasa Distance Treating Khallid Pasillas/Extender: Skipper Cliche in Treatment: 1 Active Problems Location of Pain Severity and Description of Pain Patient Has Paino No Site Locations Rate the pain. Current Pain Level: 0 Pain Management and Medication Current Pain Management: Electronic Signature(s) Signed: 12/04/2020 3:34:00 PM By: Dolan Amen RN Entered By: Dolan Amen on 12/04/2020 13:05:03 Malatesta, Cohen J. ([id number]) -------------------------------------------------------------------------------- Patient/Caregiver Education Details Patient Name: Cindy [name]. Date of Service: 12/04/2020 12:30 PM Medical Record Number: [id number] Patient Account Number: [id number] Date of Birth/Gender: November 07, 1950 (70 y.o. F) Treating RN: Dolan Amen Primary Care Physician: Marnee Guarneri Other Clinician: Referring Physician: Thereasa Distance Treating Physician/Extender: Skipper Cliche in Treatment: 1 Education Assessment Education Provided To: Patient Education Topics Provided Wound/Skin  Impairment: Methods: Explain/Verbal Responses: State content correctly Electronic Signature(s) Signed: 12/04/2020 3:34:00 PM By: Dolan Amen RN Entered By: Dolan Amen on 12/04/2020 13:23:34 Reitz, Delanna J. ([id number]) -------------------------------------------------------------------------------- Wound Assessment Details Patient Name: Cindy [name]. Date of Service: 12/04/2020 12:30 PM Medical Record Number: [id number] Patient Account Number: [id number] Date of Birth/Sex: 23-Nov-1950 (70 y.o. F) Treating RN: Dolan Amen Primary Care Astryd Pearcy: Marnee Guarneri Other Clinician: Referring Lucciana Head: Thereasa Distance Treating Nadeen Shipman/Extender: Skipper Cliche in Treatment: 1 Wound Status Wound Number: 1 Primary Etiology: Inflammatory Wound Location: Left Abdomen - Lower Quadrant Wound Status: Open Wounding Event: Gradually Appeared Comorbid History: Hypertension, Type II Diabetes Date Acquired: 08/29/2020 Weeks Of Treatment: 1 Clustered Wound: No Photos Wound Measurements Length: (cm) 1.7 Width: (cm) 2.6 Depth: (cm) 0.1 Area: (cm) 3.471 Volume: (cm) 0.347 % Reduction in Area: 42.6% % Reduction in Volume: 42.6% Epithelialization: Small (1-33%) Tunneling: No Undermining: No Wound Description Classification: Full Thickness Without Exposed Support Structures Exudate Amount: Medium Exudate Type: Serosanguineous Exudate Color: red, brown Foul Odor After Cleansing: No Slough/Fibrino Yes Wound Bed Granulation Amount:  Large (67-100%) Exposed Structure Granulation Quality: Red Fascia Exposed: No Necrotic Amount: Small (1-33%) Fat Layer (Subcutaneous Tissue) Exposed: Yes Necrotic Quality: Adherent Slough Tendon Exposed: No Muscle Exposed: No Joint Exposed: No Bone Exposed: No Treatment Notes Wound #1 (Abdomen - Lower Quadrant) Wound Laterality: Left Cleanser Soap and Water Discharge Instruction: Gently cleanse wound with antibacterial soap, rinse and pat dry  prior to dressing wounds Peri-Wound Care Koppen, Angelyne J. ([id number]) Topical Primary Dressing Xeroform-HBD 2x2 (in/in) Discharge Instruction: Apply Xeroform-HBD 2x2 (in/in) as directed Secondary Dressing Mepilex Border Flex, 4x4 (in/in) Discharge Instruction: Apply to wound as directed. Do not cut. Secured With Compression Wrap Compression Stockings Environmental education officer) Signed: 12/04/2020 3:34:00 PM By: Dolan Amen RN Entered By: Dolan Amen on 12/04/2020 13:09:38 Humble, Monika J. ([id number]) -------------------------------------------------------------------------------- Vitals Details Patient Name: Cindy [name]. Date of Service: 12/04/2020 12:30 PM Medical Record Number: [id number] Patient Account Number: [id number] Date of Birth/Sex: 1951/05/17 (70 y.o. F) Treating RN: Dolan Amen Primary Care Jocelyne Reinertsen: Marnee Guarneri Other Clinician: Referring Andi Mahaffy: Thereasa Distance Treating Lynnwood Beckford/Extender: Skipper Cliche in Treatment: 1 Vital Signs Time Taken: 13:02 Temperature (F): 98.8 Height (in): 60 Pulse (bpm): 73 Weight (lbs): 242 Respiratory Rate (breaths/min): 18 Body Mass Index (BMI): 47.3 Blood Pressure (mmHg): 136/73 Reference Range: 80 - 120 mg / dl Electronic Signature(s) Signed: 12/04/2020 3:34:00 PM By: Dolan Amen RN Entered By: Dolan Amen on 12/04/2020 13:04:41

## 2020-12-05 ENCOUNTER — Telehealth: Payer: Self-pay

## 2020-12-05 NOTE — Chronic Care Management (AMB) (Signed)
  Chronic Care Management   Note  12/05/2020 Name: Cindy [name] MRN: [id number] DOB: August 27, 1950  Cindy [name] is a 70 y.o. year old female who is a primary care patient of Cannady, Barbaraann Faster, NP. I reached out to Bland Span by phone today in response to a referral sent by Ms. [name] Millette's PCP, Venita Lick, NP      Ms. Eppes was given information about Chronic Care Management services today including:  CCM service includes personalized support from designated clinical staff supervised by her physician, including individualized plan of care and coordination with other care providers 24/7 contact phone numbers for assistance for urgent and routine care needs. Service will only be billed when office clinical staff spend 20 minutes or more in a month to coordinate care. Only one practitioner may furnish and bill the service in a calendar month. The patient may stop CCM services at any time (effective at the end of the month) by phone call to the office staff. The patient will be responsible for cost sharing (co-pay) of up to 20% of the service fee (after annual deductible is met).  Patient agreed to services and verbal consent obtained.   Follow up plan: Telephone appointment with care management team member scheduled for:12/24/2020  Noreene Larsson, [place], [place],  [id number] Direct Dial: [phone] [email or web address]: [email or web address]

## 2020-12-12 ENCOUNTER — Encounter: Payer: Medicare HMO | Admitting: Physician Assistant

## 2020-12-12 ENCOUNTER — Other Ambulatory Visit: Payer: Self-pay

## 2020-12-12 DIAGNOSIS — I480 Paroxysmal atrial fibrillation: Secondary | ICD-10-CM | POA: Diagnosis not present

## 2020-12-12 DIAGNOSIS — L98492 Non-pressure chronic ulcer of skin of other sites with fat layer exposed: Secondary | ICD-10-CM | POA: Diagnosis not present

## 2020-12-12 DIAGNOSIS — I10 Essential (primary) hypertension: Secondary | ICD-10-CM | POA: Diagnosis not present

## 2020-12-12 DIAGNOSIS — L03311 Cellulitis of abdominal wall: Secondary | ICD-10-CM | POA: Diagnosis not present

## 2020-12-12 DIAGNOSIS — Z7901 Long term (current) use of anticoagulants: Secondary | ICD-10-CM | POA: Diagnosis not present

## 2020-12-12 NOTE — Progress Notes (Addendum)
CASH, MEADOW ([id number]) Visit Report for 12/12/2020 Chief Complaint Document Details Patient Name: Cindy [name], Cindy [name]. Date of Service: 12/12/2020 12:30 PM Medical Record Number: [id number] Patient Account Number: [id number] Date of Birth/Sex: 10-24-1950 (70 y.o. F) Treating RN: Carlene Coria Primary Care Provider: Marnee Guarneri Other Clinician: Referring Provider: Thereasa Distance Treating Provider/Extender: Skipper Cliche in Treatment: 2 Information Obtained from: Patient Chief Complaint Abdominal Ulcer Electronic Signature(s) Signed: 12/12/2020 1:08:18 PM By: Worthy Keeler PA-C Entered By: Worthy Keeler on 12/12/2020 13:08:18 [name], [name] ([id number]) -------------------------------------------------------------------------------- HPI Details Patient Name: Cindy [name]. Date of Service: 12/12/2020 12:30 PM Medical Record Number: [id number] Patient Account Number: [id number] Date of Birth/Sex: [birth year] (70 y.o. F) Treating RN: Carlene Coria Primary Care Provider: Marnee Guarneri Other Clinician: Referring Provider: Thereasa Distance Treating Provider/Extender: Skipper Cliche in Treatment: 2 History of Present Illness HPI Description: 11/27/2020 upon evaluation today patient presents today for initial inspection here in our clinic concerning issues that she has been having with a wound over the left abdominal wall. Subsequently this is actually something that to be honest appears to have potentially been a infection type issue. Subsequently the patient states that she has been having spots pop up all over and none of them have been quite as large as what this is currently. Nonetheless she tells me that she has been try to manage these as much as possible on her own she has been using Hibiclens to wash them with but again not washing head to toe necessarily. Fortunately there does not appear to be any signs of overall systemic infection and even locally I do not see  anything obvious at the moment. She is on doxycycline and has been using triamcinolone as given to her by Dr. Phillip Heal who is a local dermatologist. This wound in particular has been present for about 2 months although she has been having the issue for quite some time. Patient does have a history of hypertension, atrial fibrillation, and long-term use of anticoagulant therapy, 12/04/2020 upon evaluation today patient appears to be doing well with regard to her wound. She has been tolerating the dressing changes without complication. Fortunately there is no signs of active infection which is great news and overall very pleased with where things stand today. I think that the use of the mupirocin as well as the Hibiclens washes has been helpful as well she has had no new areas pop up. 12/12/2020 upon evaluation today patient appears to be doing much better in regard to her wound on the abdominal area laterally. She has been tolerating the dressing changes without complication. Fortunately there does not appear to be any evidence of infection at this time which is great news and overall extremely pleased with where things stand today. The patient likewise is happy that this is doing much better. Electronic Signature(s) Signed: 12/12/2020 1:33:50 PM By: Worthy Keeler PA-C Entered By: Worthy Keeler on 12/12/2020 13:33:50 [name], [name] ([id number]) -------------------------------------------------------------------------------- Physical Exam Details Patient Name: Cindy [name]. Date of Service: 12/12/2020 12:30 PM Medical Record Number: [id number] Patient Account Number: [id number] Date of Birth/Sex: February 28, 1951 (70 y.o. F) Treating RN: Carlene Coria Primary Care Provider: Marnee Guarneri Other Clinician: Referring Provider: Thereasa Distance Treating Provider/Extender: Skipper Cliche in Treatment: 2 Constitutional Obese and well-hydrated in no acute distress. Respiratory normal breathing  without difficulty. Psychiatric this patient is able to make decisions and demonstrates good insight into disease process. Alert and Oriented x 3. pleasant and cooperative. Notes Upon  inspection patient's wound bed actually showed signs of good granulation epithelization at this point. There does not appear to be any evidence of active infection which is great and overall I am extremely pleased with where things stand. I do believe that she is responding extremely well to the Xeroform at this point. Electronic Signature(s) Signed: 12/12/2020 1:34:09 PM By: Worthy Keeler PA-C Entered By: Worthy Keeler on 12/12/2020 13:34:09 [name], [name] ([id number]) -------------------------------------------------------------------------------- Physician Orders Details Patient Name: Cindy [name]. Date of Service: 12/12/2020 12:30 PM Medical Record Number: [id number] Patient Account Number: [id number] Date of Birth/Sex: 12/30/1950 (70 y.o. F) Treating RN: Carlene Coria Primary Care Provider: Marnee Guarneri Other Clinician: Referring Provider: Thereasa Distance Treating Provider/Extender: Skipper Cliche in Treatment: 2 Verbal / Phone Orders: No Diagnosis Coding ICD-10 Coding Code Description L03.311 Cellulitis of abdominal wall L98.492 Non-pressure chronic ulcer of skin of other sites with fat layer exposed I10 Essential (primary) hypertension I48.0 Paroxysmal atrial fibrillation Z79.01 Long term (current) use of anticoagulants Follow-up Appointments o Return Appointment in 1 week. Bathing/ Shower/ Hygiene o May shower; gently cleanse wound with antibacterial soap, rinse and pat dry prior to dressing wounds Additional Orders / Instructions o Other: - Start Mupirocin regimen-instructions on prescription Wound Treatment Wound #1 - Abdomen - Lower Quadrant Wound Laterality: Left Cleanser: Soap and Water 1 x Per Day/15 Days Discharge Instructions: Gently cleanse wound with  antibacterial soap, rinse and pat dry prior to dressing wounds Primary Dressing: Xeroform-HBD 2x2 (in/in) (Generic) 1 x Per Day/15 Days Discharge Instructions: Apply Xeroform-HBD 2x2 (in/in) as directed Secondary Dressing: Mepilex Border Flex, 4x4 (in/in) (Generic) 1 x Per Day/15 Days Discharge Instructions: Apply to wound as directed. Do not cut. Electronic Signature(s) Signed: 12/12/2020 4:42:29 PM By: Worthy Keeler PA-C Signed: 12/15/2020 2:11:15 PM By: Carlene Coria RN Entered By: Carlene Coria on 12/12/2020 13:13:19 [name], [name] ([id number]) -------------------------------------------------------------------------------- Problem List Details Patient Name: Cindy [name], Cindy [name]. Date of Service: 12/12/2020 12:30 PM Medical Record Number: [id number] Patient Account Number: [id number] Date of Birth/Sex: [birth year] (70 y.o. F) Treating RN: Carlene Coria Primary Care Provider: Marnee Guarneri Other Clinician: Referring Provider: Thereasa Distance Treating Provider/Extender: Skipper Cliche in Treatment: 2 Active Problems ICD-10 Encounter Code Description Active Date MDM Diagnosis L03.311 Cellulitis of abdominal wall 11/27/2020 No Yes L98.492 Non-pressure chronic ulcer of skin of other sites with fat layer exposed 11/27/2020 No Yes I10 Essential (primary) hypertension 11/27/2020 No Yes I48.0 Paroxysmal atrial fibrillation 11/27/2020 No Yes Z79.01 Long term (current) use of anticoagulants 11/27/2020 No Yes Inactive Problems Resolved Problems Electronic Signature(s) Signed: 12/12/2020 1:04:28 PM By: Worthy Keeler PA-C Entered By: Worthy Keeler on 12/12/2020 13:04:28 [name], [name] ([id number]) -------------------------------------------------------------------------------- Progress Note Details Patient Name: Cindy [name]. Date of Service: 12/12/2020 12:30 PM Medical Record Number: [id number] Patient Account Number: [id number] Date of Birth/Sex: 07/22/1950 (70 y.o. F) Treating RN:  Carlene Coria Primary Care Provider: Marnee Guarneri Other Clinician: Referring Provider: Thereasa Distance Treating Provider/Extender: Skipper Cliche in Treatment: 2 Subjective Chief Complaint Information obtained from Patient Abdominal Ulcer History of Present Illness (HPI) 11/27/2020 upon evaluation today patient presents today for initial inspection here in our clinic concerning issues that she has been having with a wound over the left abdominal wall. Subsequently this is actually something that to be honest appears to have potentially been a infection type issue. Subsequently the patient states that she has been having spots pop up all over and none of them have been quite as large as  what this is currently. Nonetheless she tells me that she has been try to manage these as much as possible on her own she has been using Hibiclens to wash them with but again not washing head to toe necessarily. Fortunately there does not appear to be any signs of overall systemic infection and even locally I do not see anything obvious at the moment. She is on doxycycline and has been using triamcinolone as given to her by Dr. Phillip Heal who is a local dermatologist. This wound in particular has been present for about 2 months although she has been having the issue for quite some time. Patient does have a history of hypertension, atrial fibrillation, and long-term use of anticoagulant therapy, 12/04/2020 upon evaluation today patient appears to be doing well with regard to her wound. She has been tolerating the dressing changes without complication. Fortunately there is no signs of active infection which is great news and overall very pleased with where things stand today. I think that the use of the mupirocin as well as the Hibiclens washes has been helpful as well she has had no new areas pop up. 12/12/2020 upon evaluation today patient appears to be doing much better in regard to her wound on the abdominal area  laterally. She has been tolerating the dressing changes without complication. Fortunately there does not appear to be any evidence of infection at this time which is great news and overall extremely pleased with where things stand today. The patient likewise is happy that this is doing much better. Objective Constitutional Obese and well-hydrated in no acute distress. Vitals Time Taken: 12:51 PM, Height: 60 in, Weight: 242 lbs, BMI: 47.3, Temperature: 98.8 F, Pulse: 62 bpm, Respiratory Rate: 18 breaths/min, Blood Pressure: 106/50 mmHg. Respiratory normal breathing without difficulty. Psychiatric this patient is able to make decisions and demonstrates good insight into disease process. Alert and Oriented x 3. pleasant and cooperative. General Notes: Upon inspection patient's wound bed actually showed signs of good granulation epithelization at this point. There does not appear to be any evidence of active infection which is great and overall I am extremely pleased with where things stand. I do believe that she is responding extremely well to the Xeroform at this point. Integumentary (Hair, Skin) Wound #1 status is Open. Original cause of wound was Gradually Appeared. The date acquired was: 08/29/2020. The wound has been in treatment 2 weeks. The wound is located on the Left Abdomen - Lower Quadrant. The wound measures 1.5cm length x 1.5cm width x 0.1cm depth; 1.767cm^2 area and 0.177cm^3 volume. There is Fat Layer (Subcutaneous Tissue) exposed. There is no tunneling or undermining noted. There is a medium amount of serosanguineous drainage noted. There is large (67-100%) red granulation within the wound bed. There is a small (1-33%) amount of necrotic tissue within the wound bed including Adherent Slough. [name], Cindy ([id number]) Assessment Active Problems ICD-10 Cellulitis of abdominal wall Non-pressure chronic ulcer of skin of other sites with fat layer exposed Essential (primary)  hypertension Paroxysmal atrial fibrillation Long term (current) use of anticoagulants Plan Follow-up Appointments: Return Appointment in 1 week. Bathing/ Shower/ Hygiene: May shower; gently cleanse wound with antibacterial soap, rinse and pat dry prior to dressing wounds Additional Orders / Instructions: Other: - Start Mupirocin regimen-instructions on prescription WOUND #1: - Abdomen - Lower Quadrant Wound Laterality: Left Cleanser: Soap and Water 1 x Per WUJ/81 Days Discharge Instructions: Gently cleanse wound with antibacterial soap, rinse and pat dry prior to dressing wounds Primary Dressing:  Xeroform-HBD 2x2 (in/in) (Generic) 1 x Per Day/15 Days Discharge Instructions: Apply Xeroform-HBD 2x2 (in/in) as directed Secondary Dressing: Mepilex Border Flex, 4x4 (in/in) (Generic) 1 x Per Day/15 Days Discharge Instructions: Apply to wound as directed. Do not cut. 1. I would recommend that we going continue with wound care measures as before and the patient is in agreement the plan this includes the use of the Xeroform gauze which I think is doing a great job. 2. Also can cover this with a border foam dressing which I think is doing well. We will see patient back for reevaluation in 1 week here in the clinic. If anything worsens or changes patient will contact our office for additional recommendations. Electronic Signature(s) Signed: 12/12/2020 1:34:24 PM By: Worthy Keeler PA-C Entered By: Worthy Keeler on 12/12/2020 13:34:24 [name], [name] ([id number]) -------------------------------------------------------------------------------- SuperBill Details Patient Name: Cindy [name]. Date of Service: 12/12/2020 Medical Record Number: [id number] Patient Account Number: [id number] Date of Birth/Sex: [birth year] (70 y.o. F) Treating RN: Carlene Coria Primary Care Provider: Marnee Guarneri Other Clinician: Referring Provider: Thereasa Distance Treating Provider/Extender: Skipper Cliche  in Treatment: 2 Diagnosis Coding ICD-10 Codes Code Description T70.017 Cellulitis of abdominal wall L98.492 Non-pressure chronic ulcer of skin of other sites with fat layer exposed I10 Essential (primary) hypertension I48.0 Paroxysmal atrial fibrillation Z79.01 Long term (current) use of anticoagulants Facility Procedures CPT4 Code: [id number] Description: 99213 - WOUND CARE VISIT-LEV 3 EST PT Modifier: Quantity: 1 Physician Procedures CPT4 Code: [id number] Description: 65993 - WC PHYS LEVEL 3 - EST PT Modifier: Quantity: 1 CPT4 Code: Description: ICD-10 Diagnosis Description T70.177 Cellulitis of abdominal wall L98.492 Non-pressure chronic ulcer of skin of other sites with fat layer expos I10 Essential (primary) hypertension I48.0 Paroxysmal atrial fibrillation Modifier: ed Quantity: Electronic Signature(s) Signed: 12/12/2020 1:34:35 PM By: Worthy Keeler PA-C Entered By: Worthy Keeler on 12/12/2020 13:34:35

## 2020-12-15 NOTE — Progress Notes (Signed)
Cindy [name] ([id number]) Visit Report for 12/12/2020 Arrival Information Details Patient Name: Cindy [name], Cindy [name]. Date of Service: 12/12/2020 12:30 PM Medical Record Number: [id number] Patient Account Number: [id number] Date of Birth/Sex: 02/14/1951 (70 y.o. F) Treating RN: Carlene Coria Primary Care Kirstan Fentress: Marnee Guarneri Other Clinician: Referring Saisha Hogue: Thereasa Distance Treating Jahking Lesser/Extender: Skipper Cliche in Treatment: 2 Visit Information History Since Last Visit All ordered tests and consults were completed: No Patient Arrived: Wheel Chair Added or deleted any medications: No Arrival Time: 12:41 Any new allergies or adverse reactions: No Accompanied By: self Had a fall or experienced change in No Transfer Assistance: None activities of daily living that may affect Patient Identification Verified: Yes risk of falls: Secondary Verification Process Completed: Yes Signs or symptoms of abuse/neglect since last visito No Patient Requires Transmission-Based Precautions: No Hospitalized since last visit: No Patient Has Alerts: No Implantable device outside of the clinic excluding No cellular tissue based products placed in the center since last visit: Has Dressing in Place as Prescribed: Yes Pain Present Now: No Electronic Signature(s) Signed: 12/15/2020 2:11:15 PM By: Carlene Coria RN Entered By: Carlene Coria on 12/12/2020 12:51:31 Torelli, Loucile J. ([id number]) -------------------------------------------------------------------------------- Clinic Level of Care Assessment Details Patient Name: Cindy [name]. Date of Service: 12/12/2020 12:30 PM Medical Record Number: [id number] Patient Account Number: [id number] Date of Birth/Sex: 1950/08/08 (70 y.o. F) Treating RN: Carlene Coria Primary Care Paula Busenbark: Marnee Guarneri Other Clinician: Referring Keianna Signer: Thereasa Distance Treating Walden Statz/Extender: Skipper Cliche in Treatment: 2 Clinic Level of Care  Assessment Items TOOL 4 Quantity Score X - Use when only an EandM is performed on FOLLOW-UP visit 1 0 ASSESSMENTS - Nursing Assessment / Reassessment X - Reassessment of Co-morbidities (includes updates in patient status) 1 10 X- 1 5 Reassessment of Adherence to Treatment Plan ASSESSMENTS - Wound and Skin Assessment / Reassessment X - Simple Wound Assessment / Reassessment - one wound 1 5 []  - 0 Complex Wound Assessment / Reassessment - multiple wounds []  - 0 Dermatologic / Skin Assessment (not related to wound area) ASSESSMENTS - Focused Assessment []  - Circumferential Edema Measurements - multi extremities 0 []  - 0 Nutritional Assessment / Counseling / Intervention []  - 0 Lower Extremity Assessment (monofilament, tuning fork, pulses) []  - 0 Peripheral Arterial Disease Assessment (using hand held doppler) ASSESSMENTS - Ostomy and/or Continence Assessment and Care []  - Incontinence Assessment and Management 0 []  - 0 Ostomy Care Assessment and Management (repouching, etc.) PROCESS - Coordination of Care X - Simple Patient / Family Education for ongoing care 1 15 []  - 0 Complex (extensive) Patient / Family Education for ongoing care []  - 0 Staff obtains Programmer, systems, Records, Test Results / Process Orders []  - 0 Staff telephones HHA, Nursing Homes / Clarify orders / etc []  - 0 Routine Transfer to another Facility (non-emergent condition) []  - 0 Routine Hospital Admission (non-emergent condition) []  - 0 New Admissions / Biomedical engineer / Ordering NPWT, Apligraf, etc. []  - 0 Emergency Hospital Admission (emergent condition) X- 1 10 Simple Discharge Coordination []  - 0 Complex (extensive) Discharge Coordination PROCESS - Special Needs []  - Pediatric / Minor Patient Management 0 []  - 0 Isolation Patient Management []  - 0 Hearing / Language / Visual special needs []  - 0 Assessment of Community assistance (transportation, D/C planning, etc.) []  - 0 Additional  assistance / Altered mentation []  - 0 Support Surface(s) Assessment (bed, cushion, seat, etc.) INTERVENTIONS - Wound Cleansing / Measurement Karrer, Aniston J. ([id number]) X- 1 5 Simple Wound Cleansing -  one wound []  - 0 Complex Wound Cleansing - multiple wounds X- 1 5 Wound Imaging (photographs - any number of wounds) []  - 0 Wound Tracing (instead of photographs) X- 1 5 Simple Wound Measurement - one wound []  - 0 Complex Wound Measurement - multiple wounds INTERVENTIONS - Wound Dressings X - Small Wound Dressing one or multiple wounds 1 10 []  - 0 Medium Wound Dressing one or multiple wounds []  - 0 Large Wound Dressing one or multiple wounds X- 1 5 Application of Medications - topical []  - 0 Application of Medications - injection INTERVENTIONS - Miscellaneous []  - External ear exam 0 []  - 0 Specimen Collection (cultures, biopsies, blood, body fluids, etc.) []  - 0 Specimen(s) / Culture(s) sent or taken to Lab for analysis []  - 0 Patient Transfer (multiple staff / Civil Service fast streamer / Similar devices) []  - 0 Simple Staple / Suture removal (25 or less) []  - 0 Complex Staple / Suture removal (26 or more) []  - 0 Hypo / Hyperglycemic Management (close monitor of Blood Glucose) []  - 0 Ankle / Brachial Index (ABI) - do not check if billed separately X- 1 5 Vital Signs Has the patient been seen at the hospital within the last three years: Yes Total Score: 80 Level Of Care: New/Established - Level 3 Electronic Signature(s) Signed: 12/15/2020 2:11:15 PM By: Carlene Coria RN Entered By: Carlene Coria on 12/12/2020 13:13:53 Hostler, Neyla J. ([id number]) -------------------------------------------------------------------------------- Encounter Discharge Information Details Patient Name: Cindy [name]. Date of Service: 12/12/2020 12:30 PM Medical Record Number: [id number] Patient Account Number: [id number] Date of Birth/Sex: 1950/07/06 (70 y.o. F) Treating RN: Carlene Coria Primary  Care Karlton Maya: Marnee Guarneri Other Clinician: Referring Cindy [name]: Thereasa Distance Treating Kolsen Choe/Extender: Skipper Cliche in Treatment: 2 Encounter Discharge Information Items Discharge Condition: Stable Ambulatory Status: Wheelchair Discharge Destination: Home Transportation: Private Auto Accompanied By: caregiver Schedule Follow-up Appointment: Yes Clinical Summary of Care: Patient Declined Electronic Signature(s) Signed: 12/15/2020 2:11:15 PM By: Carlene Coria RN Entered By: Carlene Coria on 12/12/2020 13:20:52 Hamer, [place]. ([id number]) -------------------------------------------------------------------------------- Lower Extremity Assessment Details Patient Name: Cindy [name]. Date of Service: 12/12/2020 12:30 PM Medical Record Number: [id number] Patient Account Number: [id number] Date of Birth/Sex: [birth year] (70 y.o. F) Treating RN: Carlene Coria Primary Care Zacary Bauer: Marnee Guarneri Other Clinician: Referring Monica Zahler: Thereasa Distance Treating Dashiel Bergquist/Extender: Skipper Cliche in Treatment: 2 Electronic Signature(s) Signed: 12/15/2020 2:11:15 PM By: Carlene Coria RN Entered By: Carlene Coria on 12/12/2020 12:53:03 Haywood, Oretta J. ([id number]) -------------------------------------------------------------------------------- Multi Wound Chart Details Patient Name: Cindy [name]. Date of Service: 12/12/2020 12:30 PM Medical Record Number: [id number] Patient Account Number: [id number] Date of Birth/Sex: 20-May-1951 (70 y.o. F) Treating RN: Carlene Coria Primary Care Joesph Marcy: Marnee Guarneri Other Clinician: Referring Krystal Teachey: Thereasa Distance Treating Aleshka Corney/Extender: Skipper Cliche in Treatment: 2 Vital Signs Height(in): 60 Pulse(bpm): 83 Weight(lbs): 242 Blood Pressure(mmHg): 106/50 Body Mass Index(BMI): 47 Temperature(F): 98.8 Respiratory Rate(breaths/min): 18 Photos: [N/A:N/A] Wound Location: Left Abdomen - Lower Quadrant N/A N/A Wounding  Event: Gradually Appeared N/A N/A Primary Etiology: Inflammatory N/A N/A Comorbid History: Hypertension, Type II Diabetes N/A N/A Date Acquired: 08/29/2020 N/A N/A Weeks of Treatment: 2 N/A N/A Wound Status: Open N/A N/A Measurements L x W x D (cm) 1.5x1.5x0.1 N/A N/A Area (cm) : 1.767 N/A N/A Volume (cm) : 0.177 N/A N/A % Reduction in Area: 70.80% N/A N/A % Reduction in Volume: 70.70% N/A N/A Classification: Full Thickness Without Exposed N/A N/A Support Structures Exudate Amount: Medium N/A N/A Exudate Type: Serosanguineous N/A N/A  Exudate Color: red, brown N/A N/A Granulation Amount: Large (67-100%) N/A N/A Granulation Quality: Red N/A N/A Necrotic Amount: Small (1-33%) N/A N/A Exposed Structures: Fat Layer (Subcutaneous Tissue): N/A N/A Yes Fascia: No Tendon: No Muscle: No Joint: No Bone: No Epithelialization: Small (1-33%) N/A N/A Treatment Notes Electronic Signature(s) Signed: 12/15/2020 2:11:15 PM By: Carlene Coria RN Entered By: Carlene Coria on 12/12/2020 13:12:35 Thang, Keyairra J. ([id number]) -------------------------------------------------------------------------------- Multi-Disciplinary Care Plan Details Patient Name: Cindy [name]. Date of Service: 12/12/2020 12:30 PM Medical Record Number: [id number] Patient Account Number: [id number] Date of Birth/Sex: [birth year] (70 y.o. F) Treating RN: Carlene Coria Primary Care Mellony Danziger: Marnee Guarneri Other Clinician: Referring Adelina Collard: Thereasa Distance Treating Domnique Vantine/Extender: Skipper Cliche in Treatment: 2 Active Inactive Wound/Skin Impairment Nursing Diagnoses: Impaired tissue integrity Goals: Patient/caregiver will verbalize understanding of skin care regimen Date Initiated: 11/27/2020 Target Resolution Date: 12/27/2020 Goal Status: Active Ulcer/skin breakdown will have a volume reduction of 30% by week 4 Date Initiated: 11/27/2020 Target Resolution Date: 12/27/2020 Goal Status: Active Ulcer/skin  breakdown will have a volume reduction of 50% by week 8 Date Initiated: 11/27/2020 Target Resolution Date: 01/27/2021 Goal Status: Active Ulcer/skin breakdown will have a volume reduction of 80% by week 12 Date Initiated: 11/27/2020 Target Resolution Date: 02/27/2021 Goal Status: Active Ulcer/skin breakdown will heal within 14 weeks Date Initiated: 11/27/2020 Target Resolution Date: 03/29/2021 Goal Status: Active Interventions: Assess patient/caregiver ability to obtain necessary supplies Assess patient/caregiver ability to perform ulcer/skin care regimen upon admission and as needed Assess ulceration(s) every visit Provide education on ulcer and skin care Treatment Activities: Referred to DME Kenlee Maler for dressing supplies : 11/27/2020 Skin care regimen initiated : 11/27/2020 Notes: Electronic Signature(s) Signed: 12/15/2020 2:11:15 PM By: Carlene Coria RN Entered By: Carlene Coria on 12/12/2020 13:12:21 Venable, Cinda J. ([id number]) -------------------------------------------------------------------------------- Pain Assessment Details Patient Name: Cindy [name]. Date of Service: 12/12/2020 12:30 PM Medical Record Number: [id number] Patient Account Number: [id number] Date of Birth/Sex: 1950-06-13 (70 y.o. F) Treating RN: Carlene Coria Primary Care Claron Rosencrans: Marnee Guarneri Other Clinician: Referring Garnie Borchardt: Thereasa Distance Treating Kiele Heavrin/Extender: Skipper Cliche in Treatment: 2 Active Problems Location of Pain Severity and Description of Pain Patient Has Paino No Site Locations Pain Management and Medication Current Pain Management: Electronic Signature(s) Signed: 12/15/2020 2:11:15 PM By: Carlene Coria RN Entered By: Carlene Coria on 12/12/2020 12:51:54 Attaway, Novelle J. ([id number]) -------------------------------------------------------------------------------- Patient/Caregiver Education Details Patient Name: Cindy [name]. Date of Service: 12/12/2020 12:30  PM Medical Record Number: [id number] Patient Account Number: [id number] Date of Birth/Gender: December 03, 1950 (70 y.o. F) Treating RN: Carlene Coria Primary Care Physician: Marnee Guarneri Other Clinician: Referring Physician: Thereasa Distance Treating Physician/Extender: Skipper Cliche in Treatment: 2 Education Assessment Education Provided To: Patient Education Topics Provided Wound/Skin Impairment: Methods: Explain/Verbal Responses: State content correctly Electronic Signature(s) Signed: 12/15/2020 2:11:15 PM By: Carlene Coria RN Entered By: Carlene Coria on 12/12/2020 13:14:09 Ekblad, Tiernan J. ([id number]) -------------------------------------------------------------------------------- Wound Assessment Details Patient Name: Cindy [name]. Date of Service: 12/12/2020 12:30 PM Medical Record Number: [id number] Patient Account Number: [id number] Date of Birth/Sex: 1951/01/13 (70 y.o. F) Treating RN: Carlene Coria Primary Care Aubrii Sharpless: Marnee Guarneri Other Clinician: Referring Herron Fero: Thereasa Distance Treating Fredrico Beedle/Extender: Skipper Cliche in Treatment: 2 Wound Status Wound Number: 1 Primary Etiology: Inflammatory Wound Location: Left Abdomen - Lower Quadrant Wound Status: Open Wounding Event: Gradually Appeared Comorbid History: Hypertension, Type II Diabetes Date Acquired: 08/29/2020 Weeks Of Treatment: 2 Clustered Wound: No Photos Wound Measurements Length: (cm) 1.5 Width: (cm) 1.5 Depth: (cm) 0.1 Area: (  cm) 1.767 Volume: (cm) 0.177 % Reduction in Area: 70.8% % Reduction in Volume: 70.7% Epithelialization: Small (1-33%) Tunneling: No Undermining: No Wound Description Classification: Full Thickness Without Exposed Support Structures Exudate Amount: Medium Exudate Type: Serosanguineous Exudate Color: red, brown Foul Odor After Cleansing: No Slough/Fibrino Yes Wound Bed Granulation Amount: Large (67-100%) Exposed Structure Granulation Quality:  Red Fascia Exposed: No Necrotic Amount: Small (1-33%) Fat Layer (Subcutaneous Tissue) Exposed: Yes Necrotic Quality: Adherent Slough Tendon Exposed: No Muscle Exposed: No Joint Exposed: No Bone Exposed: No Treatment Notes Wound #1 (Abdomen - Lower Quadrant) Wound Laterality: Left Cleanser Soap and Water Discharge Instruction: Gently cleanse wound with antibacterial soap, rinse and pat dry prior to dressing wounds Peri-Wound Care Cabral, Nylan J. ([id number]) Topical Primary Dressing Xeroform-HBD 2x2 (in/in) Discharge Instruction: Apply Xeroform-HBD 2x2 (in/in) as directed Secondary Dressing Mepilex Border Flex, 4x4 (in/in) Discharge Instruction: Apply to wound as directed. Do not cut. Secured With Compression Wrap Compression Stockings Environmental education officer) Signed: 12/15/2020 2:11:15 PM By: Carlene Coria RN Entered By: Carlene Coria on 12/12/2020 12:52:44 Shouse, Zennie J. ([id number]) -------------------------------------------------------------------------------- Vitals Details Patient Name: Cindy [name]. Date of Service: 12/12/2020 12:30 PM Medical Record Number: [id number] Patient Account Number: [id number] Date of Birth/Sex: 1951/01/23 (70 y.o. F) Treating RN: Carlene Coria Primary Care Anastasya Jewell: Marnee Guarneri Other Clinician: Referring Sondos Wolfman: Thereasa Distance Treating Peirce Deveney/Extender: Skipper Cliche in Treatment: 2 Vital Signs Time Taken: 12:51 Temperature (F): 98.8 Height (in): 60 Pulse (bpm): 62 Weight (lbs): 242 Respiratory Rate (breaths/min): 18 Body Mass Index (BMI): 47.3 Blood Pressure (mmHg): 106/50 Reference Range: 80 - 120 mg / dl Electronic Signature(s) Signed: 12/15/2020 2:11:15 PM By: Carlene Coria RN Entered By: Carlene Coria on 12/12/2020 12:51:48

## 2020-12-16 ENCOUNTER — Telehealth: Payer: Self-pay | Admitting: Pharmacy Technician

## 2020-12-16 DIAGNOSIS — Z596 Low income: Secondary | ICD-10-CM

## 2020-12-16 NOTE — Progress Notes (Signed)
[facility])                                            [place] Team    12/16/2020  GALIT URICH 24-Aug-1950 [id number]                                      Medication Assistance Referral  Referral From: [facility]  Medication/Company: Vania [employer]a / BI Patient application portion:  Mailed Provider application portion: Faxed  to Marnee Guarneri, NP Provider address/fax verified via: Office website  Medication/Company: Eliquis / BMS Patient application portion:  Mailed Provider application portion: Faxed  to Marnee Guarneri, NP Provider address/fax verified via: Office website   Ersie Savino P. Rafay Dahan, [place]  [phone]

## 2020-12-17 ENCOUNTER — Other Ambulatory Visit: Payer: Self-pay

## 2020-12-17 ENCOUNTER — Ambulatory Visit (INDEPENDENT_AMBULATORY_CARE_PROVIDER_SITE_OTHER): Payer: Medicare HMO | Admitting: Nurse Practitioner

## 2020-12-17 ENCOUNTER — Encounter: Payer: Self-pay | Admitting: Nurse Practitioner

## 2020-12-17 DIAGNOSIS — N184 Chronic kidney disease, stage 4 (severe): Secondary | ICD-10-CM

## 2020-12-17 DIAGNOSIS — I480 Paroxysmal atrial fibrillation: Secondary | ICD-10-CM | POA: Diagnosis not present

## 2020-12-17 DIAGNOSIS — E1169 Type 2 diabetes mellitus with other specified complication: Secondary | ICD-10-CM

## 2020-12-17 DIAGNOSIS — E1122 Type 2 diabetes mellitus with diabetic chronic kidney disease: Secondary | ICD-10-CM | POA: Diagnosis not present

## 2020-12-17 DIAGNOSIS — E785 Hyperlipidemia, unspecified: Secondary | ICD-10-CM | POA: Diagnosis not present

## 2020-12-17 DIAGNOSIS — E1159 Type 2 diabetes mellitus with other circulatory complications: Secondary | ICD-10-CM

## 2020-12-17 DIAGNOSIS — U099 Post covid-19 condition, unspecified: Secondary | ICD-10-CM | POA: Diagnosis not present

## 2020-12-17 DIAGNOSIS — I152 Hypertension secondary to endocrine disorders: Secondary | ICD-10-CM | POA: Diagnosis not present

## 2020-12-17 MED ORDER — OZEMPIC (0.25 OR 0.5 MG/DOSE) 2 MG/1.5ML ~~LOC~~ SOPN: PEN_INJECTOR | SUBCUTANEOUS | 3 refills | Status: DC

## 2020-12-17 NOTE — Assessment & Plan Note (Signed)
BMI 44.98 with T2DM, HTN, CKD.  Recommended eating smaller high protein, low fat meals more frequently and exercising 30 mins a day 5 times a week with a goal of 10-15lb weight loss in the next 3 months. Patient voiced their understanding and motivation to adhere to these recommendations.

## 2020-12-17 NOTE — Patient Instructions (Addendum)
Take 500 MG (1/2 tablet) Metformin a day -- we are reducing this because of your kidneys. Stop Glimepiride and Jardiance. Start Ozempic (every Wednesday) take 0.25 MG into skin for 4 weeks and then increase to 0.5 MG.  Semaglutide injection solution What is this medication? SEMAGLUTIDE (Sem a GLOO tide) is used to improve blood sugar control in adults with type 2 diabetes. This medicine may be used with other diabetes medicines. This drug may also reduce the risk of heart attack or stroke if you have type 2diabetes and risk factors for heart disease. This medicine may be used for other purposes; ask your health care provider orpharmacist if you have questions. COMMON BRAND NAME(S): OZEMPIC What should I tell my care team before I take this medication? They need to know if you have any of these conditions: endocrine tumors (MEN 2) or if someone in your family had these tumors eye disease, vision problems history of pancreatitis kidney disease stomach problems thyroid cancer or if someone in your family had thyroid cancer an unusual or allergic reaction to semaglutide, other medicines, foods, dyes, or preservatives pregnant or trying to get pregnant breast-feeding How should I use this medication? This medicine is for injection under the skin of your upper leg (thigh), stomach area, or upper arm. It is given once every week (every 7 days). You will be taught how to prepare and give this medicine. Use exactly as directed. Take your medicine at regular intervals. Do not take it more often thandirected. If you use this medicine with insulin, you should inject this medicine and the insulin separately. Do not mix them together. Do not give the injections rightnext to each other. Change (rotate) injection sites with each injection. It is important that you put your used needles and syringes in a special sharps container. Do not put them in a trash can. If you do not have a sharpscontainer, call your  pharmacist or healthcare provider to get one. A special MedGuide will be given to you by the pharmacist with eachprescription and refill. Be sure to read this information carefully each time. This drug comes with INSTRUCTIONS FOR USE. Ask your pharmacist for directions on how to use this drug. Read the information carefully. Talk to yourpharmacist or health care provider if you have questions. Talk to your pediatrician regarding the use of this medicine in children.Special care may be needed. Overdosage: If you think you have taken too much of this medicine contact apoison control center or emergency room at once. NOTE: This medicine is only for you. Do not share this medicine with others. What if I miss a dose? If you miss a dose, take it as soon as you can within 5 days after the missed dose. Then take your next dose at your regular weekly time. If it has been longer than 5 days after the missed dose, do not take the missed dose. Take the next dose at your regular time. Do not take double or extra doses. If you havequestions about a missed dose, contact your health care provider for advice. What may interact with this medication? other medicines for diabetes Many medications may cause changes in blood sugar, these include: alcohol containing beverages antiviral medicines for HIV or AIDS aspirin and aspirin-like drugs certain medicines for blood pressure, heart disease, irregular heart beat chromium diuretics female hormones, such as estrogens or progestins, birth control pills fenofibrate gemfibrozil isoniazid lanreotide female hormones or anabolic steroids MAOIs like Carbex, Eldepryl, Marplan, Nardil, and Parnate medicines for  weight loss medicines for allergies, asthma, cold, or cough medicines for depression, anxiety, or psychotic disturbances niacin nicotine NSAIDs, medicines for pain and inflammation, like ibuprofen or  naproxen octreotide pasireotide pentamidine phenytoin probenecid quinolone antibiotics such as ciprofloxacin, levofloxacin, ofloxacin some herbal dietary supplements steroid medicines such as prednisone or cortisone sulfamethoxazole; trimethoprim thyroid hormones Some medications can hide the warning symptoms of low blood sugar (hypoglycemia). You may need to monitor your blood sugar more closely if youare taking one of these medications. These include: beta-blockers, often used for high blood pressure or heart problems (examples include atenolol, metoprolol, propranolol) clonidine guanethidine reserpine This list may not describe all possible interactions. Give your health care provider a list of all the medicines, herbs, non-prescription drugs, or dietary supplements you use. Also tell them if you smoke, drink alcohol, or use illegaldrugs. Some items may interact with your medicine. What should I watch for while using this medication? Visit your doctor or health care professional for regular checks on yourprogress. Drink plenty of fluids while taking this medicine. Check with your doctor or health care professional if you get an attack of severe diarrhea, nausea, and vomiting. The loss of too much body fluid can make it dangerous for you to takethis medicine. A test called the HbA1C (A1C) will be monitored. This is a simple blood test. It measures your blood sugar control over the last 2 to 3 months. You willreceive this test every 3 to 6 months. Learn how to check your blood sugar. Learn the symptoms of low and high bloodsugar and how to manage them. Always carry a quick-source of sugar with you in case you have symptoms of low blood sugar. Examples include hard sugar candy or glucose tablets. Make sure others know that you can choke if you eat or drink when you develop serious symptoms of low blood sugar, such as seizures or unconsciousness. They must getmedical help at once. Tell your  doctor or health care professional if you have high blood sugar. You might need to change the dose of your medicine. If you are sick or exercisingmore than usual, you might need to change the dose of your medicine. Do not skip meals. Ask your doctor or health care professional if you should avoid alcohol. Many nonprescription cough and cold products contain sugar oralcohol. These can affect blood sugar. Pens should never be shared. Even if the needle is changed, sharing may resultin passing of viruses like hepatitis or HIV. Wear a medical ID bracelet or chain, and carry a card that describes yourdisease and details of your medicine and dosage times. Do not become pregnant while taking this medicine. Women should inform their doctor if they wish to become pregnant or think they might be pregnant. There is a potential for serious side effects to an unborn child. Talk to your healthcare professional or pharmacist for more information. What side effects may I notice from receiving this medication? Side effects that you should report to your doctor or health care professionalas soon as possible: allergic reactions like skin rash, itching or hives, swelling of the face, lips, or tongue breathing problems changes in vision diarrhea that continues or is severe lump or swelling on the neck severe nausea signs and symptoms of infection like fever or chills; cough; sore throat; pain or trouble passing urine signs and symptoms of low blood sugar such as feeling anxious, confusion, dizziness, increased hunger, unusually weak or tired, sweating, shakiness, cold, irritable, headache, blurred vision, fast heartbeat, loss of consciousness signs  and symptoms of kidney injury like trouble passing urine or change in the amount of urine trouble swallowing unusual stomach upset or pain vomiting Side effects that usually do not require medical attention (report to yourdoctor or health care professional if they continue  or are bothersome): constipation diarrhea nausea pain, redness, or irritation at site where injected stomach upset This list may not describe all possible side effects. Call your doctor for medical advice about side effects. You may report side effects to FDA at1-800-FDA-1088. Where should I keep my medication? Keep out of the reach of children. Store unopened pens in a refrigerator between 2 and 8 degrees C (36 and 46 degrees F). Do not freeze. Protect from light and heat. After you first use the pen, it can be stored for 56 days at room temperature between 15 and 30 degrees C (59 and 86 degrees F) or in a refrigerator. Throw away your used pen after 56days or after the expiration date, whichever comes first. Do not store your pen with the needle attached. If the needle is left on,medicine may leak from the pen. NOTE: This sheet is a summary. It may not cover all possible information. If you have questions about this medicine, talk to your doctor, pharmacist, orhealth care provider.  2022 Elsevier/Gold Standard (2019-01-30 09:41:51)

## 2020-12-17 NOTE — Assessment & Plan Note (Signed)
Covid with PNA in February 2022, having ongoing SOB and loss of taste + fatigue.  Followed by cardiology with recent EF 60%.  Scheduled to see pulmonary in upcoming months, concern for new lung disease post-Covid.  Use Albuterol inhaler as needed and Flonase spray + Singulair.  Return in October.

## 2020-12-17 NOTE — Progress Notes (Signed)
BP (!) 128/55 (BP Location: Left Arm, Cuff Size: Normal)   Pulse 72   Temp 99.1 F (37.3 C) (Oral)   Ht 5' 1.2" (1.554 m)   Wt 239 lb 9.6 oz (108.7 kg)   SpO2 97%   BMI 44.98 kg/m    Subjective:    Patient ID: Cindy [name], female    DOB: 20-Mar-1951, 70 y.o.   MRN: [id number]  HPI: Cindy [name] is a 70 y.o. female  Chief Complaint  Patient presents with   Diabetes    2 week f/up   DIABETES A1c in July was 7.6%.  Taking Metformin and Glimepiride. Was ordered Jardiance by PCP recently, but was very expensive -- only got a month worth which her daughter purchased for her.   Hypoglycemic episodes:no Polydipsia/polyuria: no Visual disturbance: no Chest pain: no Paresthesias: no Glucose Monitoring: yes             Accucheck frequency: Daily             Fasting glucose: 84 to 120 recently             Post prandial:             Evening:             Before meals: Taking Insulin?: no             Long acting insulin:             Short acting insulin: Blood Pressure Monitoring: a few times a week Retinal Examination: Up to Date -- [facility] Foot Exam: Up to Date  Diabetic Education: Completed in past Pneumovax: Not up to Date - refuses Influenza: Not up to Date - refuses Aspirin: no    HYPERTENSION / HYPERLIPIDEMIA/A-FIB Followed by cardiology and last saw 10/08/20, they are monitoring her ongoing SOB post Covid in February 2022.  Continues on Eliquis, Atorvastatin, Diltiazem, Lasix, Lisinopril, Metoprolol, Spironolactone, and Minoxidil.  Does endorse difficulty with cost of Eliquis, a CCM referral was placed last visit.   Has recent echo with EF 60%.  Aortic atherosclerosis noted on 07/15/20 imaging.  A pulmonary referral was placed last visit and she is scheduled to see them upcoming she reports to further assess Post-Covid shortness of breath. Satisfied with current treatment? yes Duration of hypertension: chronic BP monitoring frequency: a few times a week BP  range: 120-130/70-80 BP medication side effects: no Duration of hyperlipidemia: chronic Cholesterol medication side effects: no Cholesterol supplements: none Medication compliance: good compliance Aspirin: no Recent stressors: no Recurrent headaches: no Visual changes: no Palpitations: no Dyspnea: worsening after Covid Chest pain: no Lower extremity edema: no Dizzy/lightheaded: no    CHRONIC KIDNEY DISEASE Labs on 12/03/20 = H/H 10.3/32.2, CRT 1.61, GFR 34.  In October last year GFR had been >60, CKD noted after Covid infection.  Sees Dr. Holley Raring for initial visit tomorrow with nephrology.   CKD status: controlled Medications renally dose:  discussed with patient Previous renal evaluation: no Pneumovax:  Not up to Date --refuses Influenza Vaccine:  Not up to Date --refuses  Relevant past medical, surgical, family and social history reviewed and updated as indicated. Interim medical history since our last visit reviewed. Allergies and medications reviewed and updated.  Review of Systems  Constitutional:  Positive for fatigue. Negative for activity change, appetite change, diaphoresis and fever.  Respiratory:  Positive for cough, shortness of breath and wheezing. Negative for chest tightness.   Cardiovascular:  Negative for chest  pain, palpitations and leg swelling.  Gastrointestinal: Negative.   Endocrine: Negative for cold intolerance, heat intolerance, polydipsia, polyphagia and polyuria.  Neurological: Negative.   Psychiatric/Behavioral: Negative.     Per HPI unless specifically indicated above     Objective:    BP (!) 128/55 (BP Location: Left Arm, Cuff Size: Normal)   Pulse 72   Temp 99.1 F (37.3 C) (Oral)   Ht 5' 1.2" (1.554 m)   Wt 239 lb 9.6 oz (108.7 kg)   SpO2 97%   BMI 44.98 kg/m   Wt Readings from Last 3 Encounters:  12/17/20 239 lb 9.6 oz (108.7 kg)  12/03/20 238 lb 12.8 oz (108.3 kg)  11/19/20 240 lb (108.9 kg)    Physical Exam Vitals and nursing  note reviewed.  Constitutional:      General: She is awake. She is not in acute distress.    Appearance: She is well-developed and well-groomed. She is morbidly obese. She is not ill-appearing or toxic-appearing.  HENT:     Head: Normocephalic.     Right Ear: Hearing normal.     Left Ear: Hearing normal.  Eyes:     General: Lids are normal.        Right eye: No discharge.        Left eye: No discharge.     Conjunctiva/sclera: Conjunctivae normal.     Pupils: Pupils are equal, round, and reactive to light.  Neck:     Thyroid: No thyromegaly.     Vascular: No carotid bruit.  Cardiovascular:     Rate and Rhythm: Normal rate and regular rhythm.     Heart sounds: Murmur heard.  Systolic murmur is present with a grade of 2/6.    No gallop.  Pulmonary:     Effort: Pulmonary effort is normal. No accessory muscle usage or respiratory distress.     Breath sounds: Wheezing present. No decreased breath sounds.     Comments: Occasional expiratory wheezes noted throughout. Abdominal:     General: Bowel sounds are normal. There is no distension.     Palpations: Abdomen is soft.  Musculoskeletal:     Cervical back: Normal range of motion and neck supple.     Right lower leg: No edema.     Left lower leg: No edema.  Lymphadenopathy:     Cervical: No cervical adenopathy.  Skin:    General: Skin is warm and dry.  Neurological:     Mental Status: She is alert and oriented to person, place, and time.     Deep Tendon Reflexes: Reflexes are normal and symmetric.     Reflex Scores:      Brachioradialis reflexes are 2+ on the right side and 2+ on the left side.      Patellar reflexes are 2+ on the right side and 2+ on the left side. Psychiatric:        Attention and Perception: Attention normal.        Mood and Affect: Mood normal.        Speech: Speech normal.        Behavior: Behavior normal. Behavior is cooperative.        Thought Content: Thought content normal.   Simple Foot  Form Visual Inspection No deformities, no ulcerations, no other skin breakdown bilaterally: Yes Sensation Testing Intact to touch and monofilament testing bilaterally: Yes Pulse Check Posterior Tibialis and Dorsalis pulse intact bilaterally: Yes Comments    Results for orders placed or performed in visit  on 12/03/20  HgB A1c  Result Value Ref Range   Hgb A1c MFr Bld 7.6 (H) 4.8 - 5.6 %   Est. average glucose Bld gHb Est-mCnc 171 mg/dL  Comprehensive metabolic panel  Result Value Ref Range   Glucose 80 65 - 99 mg/dL   BUN 32 (H) 8 - 27 mg/dL   Creatinine, Ser 1.61 (H) 0.57 - 1.00 mg/dL   eGFR 34 (L) >59 mL/min/1.73   BUN/Creatinine Ratio 20 12 - 28   Sodium 140 134 - 144 mmol/L   Potassium 4.9 3.5 - 5.2 mmol/L   Chloride 99 96 - 106 mmol/L   CO2 23 20 - 29 mmol/L   Calcium 10.0 8.7 - 10.3 mg/dL   Total Protein 7.4 6.0 - 8.5 g/dL   Albumin 4.7 3.8 - 4.8 g/dL   Globulin, Total 2.7 1.5 - 4.5 g/dL   Albumin/Globulin Ratio 1.7 1.2 - 2.2   Bilirubin Total 0.3 0.0 - 1.2 mg/dL   Alkaline Phosphatase 76 44 - 121 IU/L   AST 17 0 - 40 IU/L   ALT 16 0 - 32 IU/L  Lipid Panel w/o Chol/HDL Ratio  Result Value Ref Range   Cholesterol, Total 152 100 - 199 mg/dL   Triglycerides 123 0 - 149 mg/dL   HDL 43 >39 mg/dL   VLDL Cholesterol Cal 22 5 - 40 mg/dL   LDL Chol Calc (NIH) 87 0 - 99 mg/dL  TSH  Result Value Ref Range   TSH 1.400 0.450 - 4.500 uIU/mL  Vitamin B12  Result Value Ref Range   Vitamin B-12 1,128 232 - 1,245 pg/mL  CBC with Differential/Platelet  Result Value Ref Range   WBC 5.8 3.4 - 10.8 x10E3/uL   RBC 3.70 (L) 3.77 - 5.28 x10E6/uL   Hemoglobin 10.3 (L) 11.1 - 15.9 g/dL   Hematocrit 32.2 (L) 34.0 - 46.6 %   MCV 87 79 - 97 fL   MCH 27.8 26.6 - 33.0 pg   MCHC 32.0 31.5 - 35.7 g/dL   RDW 13.7 11.7 - 15.4 %   Platelets 314 150 - 450 x10E3/uL   Neutrophils 64 Not Estab. %   Lymphs 22 Not Estab. %   Monocytes 9 Not Estab. %   Eos 3 Not Estab. %   Basos 1 Not Estab.  %   Neutrophils Absolute 3.8 1.4 - 7.0 x10E3/uL   Lymphocytes Absolute 1.3 0.7 - 3.1 x10E3/uL   Monocytes Absolute 0.5 0.1 - 0.9 x10E3/uL   EOS (ABSOLUTE) 0.2 0.0 - 0.4 x10E3/uL   Basophils Absolute 0.0 0.0 - 0.2 x10E3/uL   Immature Granulocytes 1 Not Estab. %   Immature Grans (Abs) 0.1 0.0 - 0.1 x10E3/uL  Iron, TIBC and Ferritin Panel  Result Value Ref Range   Total Iron Binding Capacity 379 250 - 450 ug/dL   UIBC 320 118 - 369 ug/dL   Iron 59 27 - 139 ug/dL   Iron Saturation 16 15 - 55 %   Ferritin 63 15 - 150 ng/mL      Assessment & Plan:   Problem List Items Addressed This Visit       Cardiovascular and Mediastinum   Hypertension associated with diabetes ([place])    Chronic, ongoing with BP below goal today on exam, could consider medication reduction in future due to age and good control.  At this time continue current regimen and collaboration with cardiology.  Recommend she monitor BP at least a few mornings a week at home and document.  DASH diet at home.  Labs up to date.  Return in 3 months.        Relevant Medications   Semaglutide,0.25 or 0.5MG/DOS, (OZEMPIC, 0.25 OR 0.5 MG/DOSE,) 2 MG/1.5ML SOPN   A-fib (HCC)    Chronic, ongoing, followed by cardiology.  Continue Eliquis and Metoprolol + collaboration with cardiology team.  Rate controlled on exam today.  Continue to collaborate with CCM team.         Endocrine   Type 2 diabetes mellitus with morbid obesity ([place]) - Primary    Chronic, ongoing with recent A1c 7.6%, downward trend.   - Due to ongoing lower eGFR discussed with patient need to reduce Metformin to 500 MG BID, which she understands -- she will split pills in 1/2 and alert provider when needs refill on new dosing.    - Discontinue Glimepiride due to her age and risk for hypoglyemia and discontinue Jardiance at this time due to cost, CCM referral in place.  - Will start Ozempic 0.25 MG weekly and increase in 4 weeks to 0.5 MG (samples provided today for 2  months and script sent), see if CCM can assist. No family or personal h/o thyroid cancer.  First injection in office today tolerated well. - Check BS TID and focus on diabetic diet.    - Return in October for A1c -- but alert provider prior to if elevations in BS >300 or <70.         Relevant Medications   Semaglutide,0.25 or 0.5MG/DOS, (OZEMPIC, 0.25 OR 0.5 MG/DOSE,) 2 MG/1.5ML SOPN   Hyperlipidemia associated with type 2 diabetes mellitus ([facility])    Chronic, ongoing.  Continue current medication regimen and adjust as needed.  Lipid panel up to date.        Relevant Medications   Semaglutide,0.25 or 0.5MG/DOS, (OZEMPIC, 0.25 OR 0.5 MG/DOSE,) 2 MG/1.5ML SOPN   CKD stage 4 due to type 2 diabetes mellitus ([place])    Ongoing, initially noted on labs during Covid and continues to be present.  Scheduled to see nephrology tomorrow, will review note at that time.  Metformin reduced today.       Relevant Medications   Semaglutide,0.25 or 0.5MG/DOS, (OZEMPIC, 0.25 OR 0.5 MG/DOSE,) 2 MG/1.5ML SOPN     Other   Post-COVID syndrome    Covid with PNA in February 2022, having ongoing SOB and loss of taste + fatigue.  Followed by cardiology with recent EF 60%.  Scheduled to see pulmonary in upcoming months, concern for new lung disease post-Covid.  Use Albuterol inhaler as needed and Flonase spray + Singulair.  Return in October.       Morbid obesity (HCC)    BMI 44.98 with T2DM, HTN, CKD.  Recommended eating smaller high protein, low fat meals more frequently and exercising 30 mins a day 5 times a week with a goal of 10-15lb weight loss in the next 3 months. Patient voiced their understanding and motivation to adhere to these recommendations.        Relevant Medications   Semaglutide,0.25 or 0.5MG/DOS, (OZEMPIC, 0.25 OR 0.5 MG/DOSE,) 2 MG/1.5ML SOPN     Follow up plan: Return in about 3 months (around 03/06/2021) for T2DM, HTN/HLD, MOOD, POST COVID SYNDROME.

## 2020-12-17 NOTE — Assessment & Plan Note (Signed)
Chronic, ongoing with BP below goal today on exam, could consider medication reduction in future due to age and good control.  At this time continue current regimen and collaboration with cardiology.  Recommend she monitor BP at least a few mornings a week at home and document.  DASH diet at home.  Labs up to date.  Return in 3 months.

## 2020-12-17 NOTE — Assessment & Plan Note (Signed)
Ongoing, initially noted on labs during Covid and continues to be present.  Scheduled to see nephrology tomorrow, will review note at that time.  Metformin reduced today.

## 2020-12-17 NOTE — Assessment & Plan Note (Signed)
Chronic, ongoing with recent A1c 7.6%, downward trend.   - Due to ongoing lower eGFR discussed with patient need to reduce Metformin to 500 MG BID, which she understands -- she will split pills in 1/2 and alert provider when needs refill on new dosing.    - Discontinue Glimepiride due to her age and risk for hypoglyemia and discontinue Jardiance at this time due to cost, CCM referral in place.  - Will start Ozempic 0.25 MG weekly and increase in 4 weeks to 0.5 MG (samples provided today for 2 months and script sent), see if CCM can assist. No family or personal h/o thyroid cancer.  First injection in office today tolerated well. - Check BS TID and focus on diabetic diet.    - Return in October for A1c -- but alert provider prior to if elevations in BS >300 or <70.

## 2020-12-17 NOTE — Assessment & Plan Note (Signed)
Chronic, ongoing.  Continue current medication regimen and adjust as needed.  Lipid panel up to date. 

## 2020-12-17 NOTE — Assessment & Plan Note (Signed)
Chronic, ongoing, followed by cardiology.  Continue Eliquis and Metoprolol + collaboration with cardiology team.  Rate controlled on exam today.  Continue to collaborate with CCM team. ?

## 2020-12-18 DIAGNOSIS — D631 Anemia in chronic kidney disease: Secondary | ICD-10-CM | POA: Diagnosis not present

## 2020-12-18 DIAGNOSIS — N1832 Chronic kidney disease, stage 3b: Secondary | ICD-10-CM | POA: Diagnosis not present

## 2020-12-18 DIAGNOSIS — I10 Essential (primary) hypertension: Secondary | ICD-10-CM | POA: Diagnosis not present

## 2020-12-18 DIAGNOSIS — E1122 Type 2 diabetes mellitus with diabetic chronic kidney disease: Secondary | ICD-10-CM | POA: Diagnosis not present

## 2020-12-19 ENCOUNTER — Telehealth: Payer: Self-pay

## 2020-12-19 ENCOUNTER — Encounter: Payer: Medicare HMO | Admitting: Physician Assistant

## 2020-12-19 ENCOUNTER — Other Ambulatory Visit: Payer: Self-pay

## 2020-12-19 DIAGNOSIS — I480 Paroxysmal atrial fibrillation: Secondary | ICD-10-CM | POA: Diagnosis not present

## 2020-12-19 DIAGNOSIS — I10 Essential (primary) hypertension: Secondary | ICD-10-CM | POA: Diagnosis not present

## 2020-12-19 DIAGNOSIS — L03311 Cellulitis of abdominal wall: Secondary | ICD-10-CM | POA: Diagnosis not present

## 2020-12-19 DIAGNOSIS — G4733 Obstructive sleep apnea (adult) (pediatric): Secondary | ICD-10-CM | POA: Diagnosis not present

## 2020-12-19 DIAGNOSIS — Z7901 Long term (current) use of anticoagulants: Secondary | ICD-10-CM | POA: Diagnosis not present

## 2020-12-19 DIAGNOSIS — L98492 Non-pressure chronic ulcer of skin of other sites with fat layer exposed: Secondary | ICD-10-CM | POA: Diagnosis not present

## 2020-12-19 NOTE — Telephone Encounter (Signed)
Per pharmacy Ozempic is covered but patient is in the "donut hole" so patient has to pay a copay for Ozempic which is $238 and patient cant afford that. Pharmacy states trulicity will be just as expensive or more. Pharmacist gave patient, patient assistance information for jardiance.

## 2020-12-19 NOTE — Telephone Encounter (Signed)
error 

## 2020-12-19 NOTE — Progress Notes (Addendum)
IVADELL, GAUL ([id number]) Visit Report for 12/19/2020 Chief Complaint Document Details Patient Name: Cindy [name], Cindy [name]. Date of Service: 12/19/2020 12:30 PM Medical Record Number: [id number] Patient Account Number: [id number] Date of Birth/Sex: 22-Apr-1951 (69 y.o. F) Treating RN: Carlene Coria Primary Care Provider: Marnee Guarneri Other Clinician: Referring Provider: Thereasa Distance Treating Provider/Extender: Skipper Cliche in Treatment: 3 Information Obtained from: Patient Chief Complaint Abdominal Ulcer Electronic Signature(s) Signed: 12/19/2020 12:52:57 PM By: Worthy Keeler PA-C Entered By: Worthy Keeler on 12/19/2020 12:52:57 [name], [name] ([id number]) -------------------------------------------------------------------------------- HPI Details Patient Name: Cindy [name]. Date of Service: 12/19/2020 12:30 PM Medical Record Number: [id number] Patient Account Number: [id number] Date of Birth/Sex: November 29, 1950 (70 y.o. F) Treating RN: Carlene Coria Primary Care Provider: Marnee Guarneri Other Clinician: Referring Provider: Thereasa Distance Treating Provider/Extender: Skipper Cliche in Treatment: 3 History of Present Illness HPI Description: 11/27/2020 upon evaluation today patient presents today for initial inspection here in our clinic concerning issues that she has been having with a wound over the left abdominal wall. Subsequently this is actually something that to be honest appears to have potentially been a infection type issue. Subsequently the patient states that she has been having spots pop up all over and none of them have been quite as large as what this is currently. Nonetheless she tells me that she has been try to manage these as much as possible on her own she has been using Hibiclens to wash them with but again not washing head to toe necessarily. Fortunately there does not appear to be any signs of overall systemic infection and even locally I do not see  anything obvious at the moment. She is on doxycycline and has been using triamcinolone as given to her by Dr. Phillip Heal who is a local dermatologist. This wound in particular has been present for about 2 months although she has been having the issue for quite some time. Patient does have a history of hypertension, atrial fibrillation, and long-term use of anticoagulant therapy, 12/04/2020 upon evaluation today patient appears to be doing well with regard to her wound. She has been tolerating the dressing changes without complication. Fortunately there is no signs of active infection which is great news and overall very pleased with where things stand today. I think that the use of the mupirocin as well as the Hibiclens washes has been helpful as well she has had no new areas pop up. 12/12/2020 upon evaluation today patient appears to be doing much better in regard to her wound on the abdominal area laterally. She has been tolerating the dressing changes without complication. Fortunately there does not appear to be any evidence of infection at this time which is great news and overall extremely pleased with where things stand today. The patient likewise is happy that this is doing much better. 12/19/2020 upon evaluation today patient appears to be doing decently well today in regard to her wound this is measuring smaller and overall very pleased with how the abdominal ulcer is progressing. There does not appear to be any signs of infection which is great news. No fevers, chills, nausea, vomiting, or diarrhea. Electronic Signature(s) Signed: 12/19/2020 5:58:48 PM By: Worthy Keeler PA-C Entered By: Worthy Keeler on 12/19/2020 17:58:48 [name], [name] ([id number]) -------------------------------------------------------------------------------- Physical Exam Details Patient Name: [name], [name] Date of Service: 12/19/2020 12:30 PM Medical Record Number: [id number] Patient Account Number:  [id number] Date of Birth/Sex: April 16, 1951 (70 y.o. F) Treating RN: Carlene Coria Primary Care Provider: Ned Card,  Jolene Other Clinician: Referring Provider: Thereasa Distance Treating Provider/Extender: Skipper Cliche in Treatment: 3 Constitutional Obese and well-hydrated in no acute distress. Respiratory normal breathing without difficulty. Psychiatric this patient is able to make decisions and demonstrates good insight into disease process. Alert and Oriented x 3. pleasant and cooperative. Notes Upon inspection patient's wound bed showed signs of good granulation epithelization I do think that the Xeroform is doing a great job I see no reason to make any changes fortunately she has not had any new areas come up either. Electronic Signature(s) Signed: 12/19/2020 5:59:01 PM By: Worthy Keeler PA-C Entered By: Worthy Keeler on 12/19/2020 17:59:01 [name], [name] ([id number]) -------------------------------------------------------------------------------- Physician Orders Details Patient Name: Cindy [name]. Date of Service: 12/19/2020 12:30 PM Medical Record Number: [id number] Patient Account Number: [id number] Date of Birth/Sex: Oct 08, 1950 (70 y.o. F) Treating RN: Carlene Coria Primary Care Provider: Marnee Guarneri Other Clinician: Referring Provider: Thereasa Distance Treating Provider/Extender: Skipper Cliche in Treatment: 3 Verbal / Phone Orders: No Diagnosis Coding ICD-10 Coding Code Description L03.311 Cellulitis of abdominal wall L98.492 Non-pressure chronic ulcer of skin of other sites with fat layer exposed I10 Essential (primary) hypertension I48.0 Paroxysmal atrial fibrillation Z79.01 Long term (current) use of anticoagulants Follow-up Appointments o Return Appointment in 1 week. Bathing/ Shower/ Hygiene o May shower; gently cleanse wound with antibacterial soap, rinse and pat dry prior to dressing wounds Additional Orders / Instructions o Other: -  Start Mupirocin regimen-instructions on prescription Wound Treatment Wound #1 - Abdomen - Lower Quadrant Wound Laterality: Left Cleanser: Soap and Water 1 x Per Day/15 Days Discharge Instructions: Gently cleanse wound with antibacterial soap, rinse and pat dry prior to dressing wounds Primary Dressing: Xeroform-HBD 2x2 (in/in) (Generic) 1 x Per Day/15 Days Discharge Instructions: Apply Xeroform-HBD 2x2 (in/in) as directed Secondary Dressing: Mepilex Border Flex, 4x4 (in/in) (Generic) 1 x Per Day/15 Days Discharge Instructions: Apply to wound as directed. Do not cut. Electronic Signature(s) Signed: 12/19/2020 6:45:55 PM By: Worthy Keeler PA-C Signed: 12/26/2020 4:33:49 PM By: Carlene Coria RN Entered By: Carlene Coria on 12/19/2020 12:54:04 Mione, Kayelee J. ([id number]) -------------------------------------------------------------------------------- Problem List Details Patient Name: Cindy [name]. Date of Service: 12/19/2020 12:30 PM Medical Record Number: [id number] Patient Account Number: [id number] Date of Birth/Sex: Oct 26, 1950 (70 y.o. F) Treating RN: Carlene Coria Primary Care Provider: Marnee Guarneri Other Clinician: Referring Provider: Thereasa Distance Treating Provider/Extender: Skipper Cliche in Treatment: 3 Active Problems ICD-10 Encounter Code Description Active Date MDM Diagnosis L03.311 Cellulitis of abdominal wall 11/27/2020 No Yes L98.492 Non-pressure chronic ulcer of skin of other sites with fat layer exposed 11/27/2020 No Yes I10 Essential (primary) hypertension 11/27/2020 No Yes I48.0 Paroxysmal atrial fibrillation 11/27/2020 No Yes Z79.01 Long term (current) use of anticoagulants 11/27/2020 No Yes Inactive Problems Resolved Problems Electronic Signature(s) Signed: 12/19/2020 12:52:51 PM By: Worthy Keeler PA-C Entered By: Worthy Keeler on 12/19/2020 12:52:51 Study, Afton J.  ([id number]) -------------------------------------------------------------------------------- Progress Note Details Patient Name: Cindy [name]. Date of Service: 12/19/2020 12:30 PM Medical Record Number: [id number] Patient Account Number: [id number] Date of Birth/Sex: 12-08-1950 (70 y.o. F) Treating RN: Carlene Coria Primary Care Provider: Marnee Guarneri Other Clinician: Referring Provider: Thereasa Distance Treating Provider/Extender: Skipper Cliche in Treatment: 3 Subjective Chief Complaint Information obtained from Patient Abdominal Ulcer History of Present Illness (HPI) 11/27/2020 upon evaluation today patient presents today for initial inspection here in our clinic concerning issues that she has been having with a wound over the left abdominal wall. Subsequently this  is actually something that to be honest appears to have potentially been a infection type issue. Subsequently the patient states that she has been having spots pop up all over and none of them have been quite as large as what this is currently. Nonetheless she tells me that she has been try to manage these as much as possible on her own she has been using Hibiclens to wash them with but again not washing head to toe necessarily. Fortunately there does not appear to be any signs of overall systemic infection and even locally I do not see anything obvious at the moment. She is on doxycycline and has been using triamcinolone as given to her by Dr. Phillip Heal who is a local dermatologist. This wound in particular has been present for about 2 months although she has been having the issue for quite some time. Patient does have a history of hypertension, atrial fibrillation, and long-term use of anticoagulant therapy, 12/04/2020 upon evaluation today patient appears to be doing well with regard to her wound. She has been tolerating the dressing changes without complication. Fortunately there is no signs of active infection which is  great news and overall very pleased with where things stand today. I think that the use of the mupirocin as well as the Hibiclens washes has been helpful as well she has had no new areas pop up. 12/12/2020 upon evaluation today patient appears to be doing much better in regard to her wound on the abdominal area laterally. She has been tolerating the dressing changes without complication. Fortunately there does not appear to be any evidence of infection at this time which is great news and overall extremely pleased with where things stand today. The patient likewise is happy that this is doing much better. 12/19/2020 upon evaluation today patient appears to be doing decently well today in regard to her wound this is measuring smaller and overall very pleased with how the abdominal ulcer is progressing. There does not appear to be any signs of infection which is great news. No fevers, chills, nausea, vomiting, or diarrhea. Objective Constitutional Obese and well-hydrated in no acute distress. Vitals Time Taken: 12:42 PM, Height: 60 in, Weight: 242 lbs, BMI: 47.3, Temperature: 98.7 F, Pulse: 61 bpm, Respiratory Rate: 18 breaths/min, Blood Pressure: 124/53 mmHg. Respiratory normal breathing without difficulty. Psychiatric this patient is able to make decisions and demonstrates good insight into disease process. Alert and Oriented x 3. pleasant and cooperative. General Notes: Upon inspection patient's wound bed showed signs of good granulation epithelization I do think that the Xeroform is doing a great job I see no reason to make any changes fortunately she has not had any new areas come up either. Integumentary (Hair, Skin) Wound #1 status is Open. Original cause of wound was Gradually Appeared. The date acquired was: 08/29/2020. The wound has been in treatment 3 weeks. The wound is located on the Left Abdomen - Lower Quadrant. The wound measures 0.7cm length x 1.3cm width x 0.1cm depth; 0.715cm^2  area and 0.071cm^3 volume. There is Fat Layer (Subcutaneous Tissue) exposed. There is no tunneling or undermining noted. There is a medium amount of serosanguineous drainage noted. There is large (67-100%) red granulation within the wound bed. There is no necrotic tissue within the wound bed. HACKER, Pooler ([id number]) Assessment Active Problems ICD-10 Cellulitis of abdominal wall Non-pressure chronic ulcer of skin of other sites with fat layer exposed Essential (primary) hypertension Paroxysmal atrial fibrillation Long term (current) use of anticoagulants Plan Follow-up  Appointments: Return Appointment in 1 week. Bathing/ Shower/ Hygiene: May shower; gently cleanse wound with antibacterial soap, rinse and pat dry prior to dressing wounds Additional Orders / Instructions: Other: - Start Mupirocin regimen-instructions on prescription WOUND #1: - Abdomen - Lower Quadrant Wound Laterality: Left Cleanser: Soap and Water 1 x Per Day/15 Days Discharge Instructions: Gently cleanse wound with antibacterial soap, rinse and pat dry prior to dressing wounds Primary Dressing: Xeroform-HBD 2x2 (in/in) (Generic) 1 x Per Day/15 Days Discharge Instructions: Apply Xeroform-HBD 2x2 (in/in) as directed Secondary Dressing: Mepilex Border Flex, 4x4 (in/in) (Generic) 1 x Per Day/15 Days Discharge Instructions: Apply to wound as directed. Do not cut. 1. I would recommend that we going continue with wound care measures as before and the patient is in agreement with plan this includes the use of Xeroform gauze which is doing a great job. 2. Also recommend that we have the patient continue with the daily dressing changes as I think that is best. We will see patient back for reevaluation in 1 week here in the clinic. If anything worsens or changes patient will contact our office for additional recommendations. Electronic Signature(s) Signed: 12/19/2020 6:07:40 PM By: Worthy Keeler PA-C Entered By: Worthy Keeler on 12/19/2020 18:07:40 Durden, Annarae J. ([id number]) -------------------------------------------------------------------------------- SuperBill Details Patient Name: Cindy [name]. Date of Service: 12/19/2020 Medical Record Number: [id number] Patient Account Number: [id number] Date of Birth/Sex: 1950-11-29 (70 y.o. F) Treating RN: Carlene Coria Primary Care Provider: Marnee Guarneri Other Clinician: Referring Provider: Thereasa Distance Treating Provider/Extender: Skipper Cliche in Treatment: 3 Diagnosis Coding ICD-10 Codes Code Description R17.356 Cellulitis of abdominal wall L98.492 Non-pressure chronic ulcer of skin of other sites with fat layer exposed I10 Essential (primary) hypertension I48.0 Paroxysmal atrial fibrillation Z79.01 Long term (current) use of anticoagulants Facility Procedures CPT4 Code: [id number] Description: [phone] - WOUND CARE VISIT-LEV 2 EST PT Modifier: Quantity: 1 Physician Procedures CPT4 Code: [id number] Description: 72820 - WC PHYS LEVEL 3 - EST PT Modifier: Quantity: 1 CPT4 Code: Description: ICD-10 Diagnosis Description U01.561 Cellulitis of abdominal wall L98.492 Non-pressure chronic ulcer of skin of other sites with fat layer expos I10 Essential (primary) hypertension I48.0 Paroxysmal atrial fibrillation Modifier: ed Quantity: Electronic Signature(s) Signed: 12/19/2020 6:07:54 PM By: Worthy Keeler PA-C Entered By: Worthy Keeler on 12/19/2020 18:07:54

## 2020-12-22 ENCOUNTER — Telehealth: Payer: Self-pay

## 2020-12-22 NOTE — Progress Notes (Signed)
Chronic Care Management Pharmacy Note  12/22/2020 Name:  Cindy [name] MRN:  [id number] DOB:  1950-07-15  Plan: No Rx Changes, will send PAP for ozempic and check on status of eliquis as this was previously sent in per pt.   Subjective: Cindy [name] is an 70 y.o. year old female who is a primary patient of Cannady, Barbaraann Faster, NP.  The CCM team was consulted for assistance with disease management and care coordination needs.    Engaged with patient by telephone for initial visit in response to provider referral for pharmacy case management and/or care coordination services.   Consent to Services:  The patient was given the following information about Chronic Care Management services today, agreed to services, and gave verbal consent: 1. CCM service includes personalized support from designated clinical staff supervised by the primary care provider, including individualized plan of care and coordination with other care providers 2. 24/7 contact phone numbers for assistance for urgent and routine care needs. 3. Service will only be billed when office clinical staff spend 20 minutes or more in a month to coordinate care. 4. Only one practitioner may furnish and bill the service in a calendar month. 5.The patient may stop CCM services at any time (effective at the end of the month) by phone call to the office staff. 6. The patient will be responsible for cost sharing (co-pay) of up to 20% of the service fee (after annual deductible is met). Patient agreed to services and consent obtained.  Patient Care Team: Venita Lick, NP as PCP - General (Nurse Practitioner) Vanita Ingles, RN as Registered Nurse (General Practice)   Objective:  Lab Results  Component Value Date   CREATININE 1.61 (H) 12/03/2020   CREATININE 1.99 (H) 11/19/2020   CREATININE 1.33 (H) 07/18/2020    Lab Results  Component Value Date   HGBA1C 7.6 (H) 12/03/2020   Last diabetic Eye exam: No results found for:  HMDIABEYEEXA  Last diabetic Foot exam: No results found for: HMDIABFOOTEX      Component Value Date/Time   CHOL 152 12/03/2020 1428   TRIG 123 12/03/2020 1428   HDL 43 12/03/2020 1428   LDLCALC 87 12/03/2020 1428    Hepatic Function Latest Ref Rng & Units 12/03/2020 07/18/2020 07/17/2020  Total Protein 6.0 - 8.5 g/dL 7.4 7.1 7.8  Albumin 3.8 - 4.8 g/dL 4.7 3.5 3.8  AST 0 - 40 IU/L 17 32 33  ALT 0 - 32 IU/L '16 21 17  ' Alk Phosphatase 44 - 121 IU/L 76 57 61  Total Bilirubin 0.0 - 1.2 mg/dL 0.3 0.4 0.2(L)    Lab Results  Component Value Date/Time   TSH 1.400 12/03/2020 02:28 PM   TSH 2.185 09/07/2015 11:16 AM    CBC Latest Ref Rng & Units 12/03/2020 11/19/2020 07/18/2020  WBC 3.4 - 10.8 x10E3/uL 5.8 6.2 4.9  Hemoglobin 11.1 - 15.9 g/dL 10.3(L) 9.6(L) 8.9(L)  Hematocrit 34.0 - 46.6 % 32.2(L) 29.4(L) 26.6(L)  Platelets 150 - 450 x10E3/uL 314 235 246    No results found for: VD25OH  Clinical ASCVD:  The 10-year ASCVD risk score Mikey Bussing DC Jr., et al., 2013) is: 40%   Values used to calculate the score:     Age: 67 years     Sex: Female     Is Non-Hispanic African American: Yes     Diabetic: Yes     Tobacco smoker: Yes     Systolic Blood Pressure: 263 mmHg  Is BP treated: Yes     HDL Cholesterol: 43 mg/dL     Total Cholesterol: 152 mg/dL    Social History   Tobacco Use  Smoking Status Never  Smokeless Tobacco Current   Types: Snuff   BP Readings from Last 3 Encounters:  12/17/20 (!) 128/55  12/03/20 (!) 126/56  11/20/20 (!) 149/60   Pulse Readings from Last 3 Encounters:  12/17/20 72  12/03/20 (!) 59  11/20/20 88   Wt Readings from Last 3 Encounters:  12/17/20 239 lb 9.6 oz (108.7 kg)  12/03/20 238 lb 12.8 oz (108.3 kg)  11/19/20 240 lb (108.9 kg)    Assessment: Review of patient past medical history, allergies, medications, health status, including review of consultants reports, laboratory and other test data, was performed as part of comprehensive evaluation  and provision of chronic care management services.   SDOH:  (Social Determinants of Health) assessments and interventions performed: Yes   CCM Care Plan  Allergies  Allergen Reactions   Bupropion Other (See Comments)   Gabapentin Palpitations   Pioglitazone Palpitations   Vicodin [Hydrocodone-Acetaminophen] Other (See Comments)    Reaction: racing heart   Vicoprofen [Hydrocodone-Ibuprofen] Other (See Comments)    Reaction: racing heart    Medications Reviewed Today     Reviewed by Venita Lick, NP (Nurse Practitioner) on 12/17/20 at 1144  Med List Status: <None>   Medication Order Taking? Sig Documenting Provider Last Dose Status Informant  acetaminophen (TYLENOL) 650 MG CR tablet [id number] Yes Take 650 mg by mouth every 8 (eight) hours as needed for pain. [provider] Taking Active Multiple Informants  albuterol (VENTOLIN HFA) 108 (90 Base) MCG/ACT inhaler [id number] Yes Inhale 1-2 puffs into the lungs every 6 (six) hours as needed for wheezing or shortness of breath. Marnee Guarneri T, NP Taking Active   amLODipine (NORVASC) 10 MG tablet [id number] Yes Take 10 mg by mouth daily. [provider] Taking Active Multiple Informants  apixaban (ELIQUIS) 5 MG TABS tablet [id number] Yes Take 1 tablet (5 mg total) by mouth 2 (two) times daily. Hillary Bow, MD Taking Active   atorvastatin (LIPITOR) 40 MG tablet [id number] Yes Take 40 mg by mouth daily. [provider] Taking Active Multiple Informants  cetirizine (ZYRTEC) 10 MG tablet [id number] Yes Take 10 mg by mouth daily. [provider] Taking Active Multiple Informants  diltiazem (CARDIZEM CD) 360 MG 24 hr capsule [id number] Yes Take 360 mg by mouth daily. [provider] Taking Active   DULoxetine (CYMBALTA) 60 MG capsule [id number] Yes Take 60 mg by mouth daily. [provider] Taking Active   empagliflozin (JARDIANCE) 10 MG TABS tablet [id number] Yes Take 10 mg by mouth daily.  [provider] Taking Active   ferrous sulfate 325 (65 FE) MG tablet [id number] Yes Take 1 tablet (325 mg total) by mouth 2 (two) times daily with a meal. Amin, Jeanella Flattery, MD Taking Active   fluticasone (FLONASE) 50 MCG/ACT nasal spray [id number] Yes Place 2 sprays into both nostrils daily. Marnee Guarneri T, NP Taking Active   furosemide (LASIX) 80 MG tablet [id number] Yes Take 80 mg by mouth 2 (two) times daily. [provider] Taking Active Multiple Informants  glimepiride (AMARYL) 2 MG tablet [id number] Yes Take 2-4 mg by mouth 2 (two) times daily. Take 50m in the morning and 251min the evening [provider] Taking Active Multiple Informants  lisinopril (PRINIVIL,ZESTRIL) 40 MG tablet [id number] Take 40 mg by mouth daily. [provider]  Taking Active Multiple Informants  metFORMIN (GLUCOPHAGE) 1000 MG tablet [id number] Yes Take 1,000 mg by mouth 2 (two) times daily with a meal. [provider] Taking Active Multiple Informants  metoprolol succinate (TOPROL-XL) 100 MG 24 hr tablet [id number] Yes Take 50-100 mg by mouth 2 (two) times daily. Take 1 tablet in the morning and one-half tablet in the evening. Take with or immediately following a meal. [provider] Taking Active Multiple Informants  minoxidil (LONITEN) 10 MG tablet [id number] Yes Take 10 mg by mouth daily. [provider] Taking Active Multiple Informants  montelukast (SINGULAIR) 10 MG tablet [id number] Yes Take 10 mg by mouth daily as needed (allergies).  [provider] Taking Active Multiple Informants  Multiple Vitamin (MULTIVITAMIN WITH MINERALS) TABS tablet [id number] Yes Take 1 tablet by mouth daily. [provider] Taking Active Multiple Informants  omeprazole (PRILOSEC) 20 MG capsule [id number] Yes Take 20 mg by mouth daily as needed (heartburn).  [provider] Taking Active Multiple Informants  polyethylene glycol powder  (GLYCOLAX/MIRALAX) powder [id number] Yes Take 17 g by mouth daily.  [provider] Taking Active Multiple Informants  Semaglutide,0.25 or 0.5MG/DOS, (OZEMPIC, 0.25 OR 0.5 MG/DOSE,) 2 MG/1.5ML SOPN [id number] Yes Start with 0.25MG once a week x 4 weeks, then increase to 0.5MG weekly. Marnee Guarneri T, NP  Active   spironolactone (ALDACTONE) 25 MG tablet [id number] Yes Take 25 mg by mouth daily. [provider] Taking Active             Patient Active Problem List   Diagnosis Date Noted   Post-COVID syndrome 12/03/2020   Recurrent boils 12/03/2020   Morbid obesity ([place]) 12/03/2020   CKD stage 4 due to type 2 diabetes mellitus ([place]) 12/01/2020   Aortic atherosclerosis ([place]) 12/01/2020   Hypertension associated with diabetes ([place]) 07/15/2020   A-fib ([place]) 07/15/2020   Chronic pain syndrome 07/15/2020   Depression, major, single episode, moderate ([place]) 07/15/2020   Type 2 diabetes mellitus with morbid obesity ([place]) 07/15/2020   GERD (gastroesophageal reflux disease) 07/15/2020   Hyperlipidemia associated with type 2 diabetes mellitus ([place]) 07/15/2020   OSA (obstructive sleep apnea) 07/15/2020   Other thrombophilia ([place]) 03/12/2020   History of lumbar spinal fusion 04/26/2018   Diuretic-induced hypokalemia 03/24/2018   Hypomagnesemia 03/24/2018   Hypertensive retinopathy of both eyes 06/08/2017   DDD (degenerative disc disease), lumbar 10/31/2014   Cervical spondylosis with myelopathy 02/22/2014   Iron deficiency anemia 02/22/2014   Mitral regurgitation 09/08/2013    Immunization History  Administered Date(s) Administered   Moderna Sars-Covid-2 Vaccination 11/18/2020, 12/16/2020    Conditions to be addressed/monitored: T2DM, MDD, HLD, HTN, IDA, GERD, OSA, Obesity  Care Plan : [place]  Updates made by Madelin Rear, [facility] since 12/23/2020 12:00 AM     Problem: T2DM, MDD, HLD, HTN, IDA, GERD, OSA, Obesity   Priority: High     Long-Range Goal:  Disease Management   Start Date: 12/23/2020  Expected End Date: 12/23/2021  This Visit's Progress: On track  Priority: High  Note:   Current Barriers:  Medication affordability for diabetes and afib medications  Pharmacist Clinical Goal(s):  Patient will contact provider office for questions/concerns as evidenced notation of same in electronic health record through collaboration with PharmD and provider.   Interventions: 1:1 collaboration with Venita Lick, NP regarding development and update of comprehensive plan of care as evidenced by provider attestation and co-signature Inter-disciplinary care team collaboration (see longitudinal plan of care) Comprehensive medication review performed;  medication list updated in electronic medical record  Hypertension (BP goal <130/80) -Controlled -Tolerating well for the most part. Did note some occasional symptomatic low BP at home. Has not been recurrent, will let us know if becomes recurrent. -Current treatment: Diltiazem 360 mg CD 24H once daily  Furosemide 80 mg twice daily Metoprolol succinate 100 mg tablet - whole tablet (100 mg) every morning, half tablet (50 mg) every evening Minoxidil 10 mg once daily Spironolactone 25 mg once daily  -Medications previously tried: amlodipine  -Current home readings: [id number] systolic -Current dietary habits: limited appetite, will try to eat every 3 hours  -Current exercise habits: walks as tolerated w/ SOB and back pain. Has upcoming echo and pfts. -Denies hypotensive/hypertensive symptoms -Educated on Symptoms of hypotension and importance of maintaining adequate hydration; -Counseled to monitor BP at home 3x/week, document, and provide log at future appointments -Recommended to continue current medication  Hyperlipidemia: (LDL goal < 70) -Not ideally controlled -Current treatment: Atorvastatin 40 mg daily  -Educated on Cholesterol goals;  Benefits of statin for ASCVD risk  reduction; -Recommended to continue current medication Could consider atorvastatin dose increase to 84m due to LDL >70 and several CV risk factors if LDL elevated at follow-up.  Diabetes (A1c goal <7%) -Not ideally controlled -Recent GFRs 34 12/03/2020 and 27 11/19/20 -Current medications: Metformin 500 mg twice daily (reduced 11/2020) Ozempic 0.25 mg injection once weekly for four weeks then increase to 0.5 mg once weekly -Medications previously tried: Jardiance (GFR/Cost), glimepiride, pioglitazone   -Current home glucose readings fasting glucose: 120s -Denies hypoglycemic/hyperglycemic symptoms -Educated on A1c and blood sugar goals; -Counseled to check feet daily and get yearly eye exams -Ozempic patient assistance paper work will be sent out.  -Recommended to continue current medication  Patient Goals/Self-Care Activities Patient will:  - take medications as prescribed  Medication Assistance: Application for Ozempic  medication assistance program. in process.  Anticipated assistance start date 12/2020.  See plan of care for additional detail. Will also check on Eliquis patient assistance. Patient has not heard back after dropping off paper work, would have been submitted 1 month ago per patient.     [facility]  [facility] [address]Phone: [phone]: [phone] Follow Up:  Patient agrees to Care Plan and Follow-up.  Plan:  Pharmacist f/u 6 months or sooner, CPA to address pap per note and DM call 1 month to review readings and ensure continuity of tx.  Future Appointments  Date Time Provider [place] 12/24/2020  1:00 PM CFP CCM CASE MANAGER [facility]  12/26/2020 12:30 PM SJeri CosEday III, PA-C [facility] None  01/02/2021 12:30 PM SJeri CosEClintonIII, PA-C [facility] None  01/13/2021 10:30 AM WTanda Rockers MD [facility] None  03/18/2021 11:20 AM CVenita Lick NP  [facility] [place] PharmD, CPP Clinical Pharmacist Practitioner  [facility]  [phone]

## 2020-12-22 NOTE — Telephone Encounter (Signed)
Patient has been scheduled

## 2020-12-22 NOTE — Chronic Care Management (AMB) (Signed)
Chronic Care Management Pharmacy Assistant   Name: Cindy [name]  MRN: [id number] DOB: 10/15/1950  Cindy [name] is an 70 y.o. year old female who presents for her initial CCM visit with the clinical pharmacist.  Reason for Encounter: Chart Prep/ IQ   Recent office visits:  12/17/20- Cindy Guarneri, NP- chronic conditions addressed, started semaglutide start with 0.25MG  once a week x 4 weeks, then increase to 0.5MG  weekly, encounter noted decreased metformin to 500 mg bid due to lower GFR, discontinued glimepride due to age and hypoglycemia risk, discontinued Jardiance due to cost, follow up 3 months  12/03/20- Cindy Guarneri, NP- seen for new patient visit to establish care, labs ordered, referral to nephrology, referral to pulmonology, no medication changes, follow up 2 weeks   Recent consult visits:  12/19/20- Cindy [name]  Cindy Stone III, PA-C ( Wound)- Seen for 3rd week treatment of abdominal ulcer, no medication changes, follow up 1 week 12/18/20- Cindy Lateef, MD (Nephrology)- seen for follow up of type 2 diabetes mellitus, labs ordered, no medication changes, follow up 4 months  11/19/20- seen for evaluation of fever, SOB, and dizziness, admitted to hospital due to concern for systemic infection 10/08/20- Cindy Cowman, MD (Cardiology)- seen for follow up of dizziness, short course Keflex 500 mg bid x 10 days, 2D echocardiogram ordered, folllow up for echocardiogram  09/10/20- Cindy Kayser, MD Cindy [name])- seen for follow up of renal insufficiency, started glimepride 2 mg two tablets each morning and one tablet every evening, labs ordered,  follow up 6 months  07/15/20- Cindy Salt DO (Urgent Care)- seen for acute respiratory failure, new acute kidney injury, admitted to hospital   Hospital visits:  Medication Reconciliation was completed by comparing discharge summary, patient's EMR and Pharmacy list, and upon discussion with patient.  Admitted to the hospital on 07/15/20 due to  acute hypoxemic respiratory failure due to Covid- 19. Discharge date was 07/18/20. Discharged from [facility]?Medications Started at [facility] Discharge:?? Albuterol inhaler 108 mcg/ act  Dexamethasone- 6 day supply Ferrous Sulfate 325 mg  [place]   All other medications remain the same after Hospital Discharge   2. Admitted to the hospital on 11/19/20 due to SOB and weakness. Discharge date was 11/20/20. Discharged from [facility] Emergency Department   New?Medications Started at [facility] Discharge:?? Doxycycline 100 mg BID Cephalexin 500 mg QID   All other medications remain the same after Hospital Discharge  Medications: Outpatient Encounter Medications as of 12/22/2020  Medication Sig   acetaminophen (TYLENOL) 650 MG CR tablet Take 650 mg by mouth every 8 (eight) hours as needed for pain.   albuterol (VENTOLIN HFA) 108 (90 Base) MCG/ACT inhaler Inhale 1-2 puffs into the lungs every 6 (six) hours as needed for wheezing or shortness of breath.   amLODipine (NORVASC) 10 MG tablet Take 10 mg by mouth daily.   apixaban (ELIQUIS) 5 MG TABS tablet Take 1 tablet (5 mg total) by mouth 2 (two) times daily.   atorvastatin (LIPITOR) 40 MG tablet Take 40 mg by mouth daily.   cetirizine (ZYRTEC) 10 MG tablet Take 10 mg by mouth daily.   diltiazem (CARDIZEM CD) 360 MG 24 hr capsule Take 360 mg by mouth daily.   DULoxetine (CYMBALTA) 60 MG capsule Take 60 mg by mouth daily.   ferrous sulfate 325 (65 FE) MG tablet Take 1 tablet (325 mg total) by mouth 2 (two) times daily with a meal.   fluticasone (FLONASE) 50 MCG/ACT nasal  spray Place 2 sprays into both nostrils daily.   furosemide (LASIX) 80 MG tablet Take 80 mg by mouth 2 (two) times daily.   lisinopril (PRINIVIL,ZESTRIL) 40 MG tablet Take 40 mg by mouth daily.   metFORMIN (GLUCOPHAGE) 1000 MG tablet Take 500 mg by mouth 2 (two) times daily with a meal.   metoprolol succinate (TOPROL-XL) 100 MG  24 hr tablet Take 50-100 mg by mouth 2 (two) times daily. Take 1 tablet in the morning and one-half tablet in the evening. Take with or immediately following a meal.   minoxidil (LONITEN) 10 MG tablet Take 10 mg by mouth daily.   montelukast (SINGULAIR) 10 MG tablet Take 10 mg by mouth daily as needed (allergies).    Multiple Vitamin (MULTIVITAMIN WITH MINERALS) TABS tablet Take 1 tablet by mouth daily.   omeprazole (PRILOSEC) 20 MG capsule Take 20 mg by mouth daily as needed (heartburn).    polyethylene glycol powder (GLYCOLAX/MIRALAX) powder Take 17 g by mouth daily.    Semaglutide,0.25 or 0.5MG /DOS, (OZEMPIC, 0.25 OR 0.5 MG/DOSE,) 2 MG/1.5ML SOPN Start with 0.25MG  once a week x 4 weeks, then increase to 0.5MG  weekly.   spironolactone (ALDACTONE) 25 MG tablet Take 25 mg by mouth daily.   No facility-administered encounter medications on file as of 12/22/2020.   I spoke with Ms. Cindy [name] today and she is very pleasant. She has 4 daughters  who help her as much as they can and 1 adopted son who she hasn't seen in a year. She lives with her husband and although he is retired he continues to work part time  in order to support their necessities. Even with the added income they are still unable to afford some things which "depresses" her. Ms. Cindy [name]  stated that she was very sick and ever since her transition to CFP she has started feeling a lot better overall. She is still experiencing some SOB and stated that the albuterol inhaler has helped some, but she is still winded by walking and being active. She is able to recover after sitting for 10 minutes.   Current Documented Medications acetaminophen  650 MG CR tablet albuterol 108 MCG/ACT inhaler- 25 DS last filled 08/22/20 amLODipine  10 MG tablet apixaban 5 MG TABS - 30 DS last filled 10/17/20 atorvastatin 40 MG - 90 DS last filled 09/16/20 cetirizine 10 MG tablet diltiazem 360 MG -  - 90 DS last filled 09/02/20 DULoxetine 60 MG - 90 DS last filled  09/08/20 ferrous sulfate 325 (65 FE) MG tablet fluticasone 50 MCG/ACT nasal spray furosemide 80 MG- 90 DS last filled 11/17/20 lisinopril 40 MG- 90 DS last filled 11/01/20 metFORMIN 1000 MG- 90 DS last filled 09/08/20 Patient taking 500 mg BID metoprolol succinate  100 MG - 90 DS last filled 09/02/20 minoxidil 10 MG - 90 DS last filled 11/03/20 montelukast 10 MG- 90 DS last filled 09/16/20 Multiple Vitamin TABS tablet omeprazole  20 MG capsule polyethylene glycol powder powder Semaglutide,0.25 or 0.5MG /DOS, 2 MG/1.5ML SOPN spironolactone 25 MG- 90 DS last filled 09/17/20  Have you seen any other providers since your last visit?  Patient was seen by wound care 12/19/20   Any changes in your medications or health?  12/17/20- started semaglutide start with 0.25MG  once a week x 4 weeks, then increase to 0.5MG  weekly Decreased metformin to 500 mg bid due to lower GFR Discontinued glimepride due to age and hypoglycemia risk Discontinued Jardiance due to cost 07/18/20- started albuterol inhaler   Any side effects  from any medications?  Patient stated she currently has no side effects from medications  Do you have an symptoms or problems not managed by your medications?  Patient stated she experiences back, hip, and leg pain that she believes is related to her sciatic nerve, neuropathy,  and disintegrating back. She was receiving steroid injections from her orthopedic provider, but she stated that they do not help and are a waste of money. She would like to explore other options to get this examined by a provider.   Any concerns about your health right now?  Patient stated she has been experiencing post nasal drip when bending over and feels like she is underwater and has been this way for the last two weeks. Her PCP informed her that it may be a lingering symptom of Covid- 19.   Has your provider asked that you check blood pressure, blood sugar, or follow special diet at home?  Patient is  currently using a borrowed BP monitor to track her BP and will be needing to return it spontaneously.  Patient currently tracks her BS and stated that they have been improving drastically since the addition of Ozempic. Current ranges are 120, 160-180 Patient stated she has been making conscious decisions with her eating. She reports not having an appetite due to not being able to taste and smell. She usually eats every three hours and starts her day with fruits. Throughout the day she eats peanut butter with graham crackers, cold cut sandwiches, pb &j, and drinks Boost in between meals.  Do you get any type of exercise on a regular basis?  Patient stated she regularly uses a stationary bicycle and keeps moving when sitting down. She does a lot of exercises with her own body weight. Although she tries to walk, she has difficulty doing so and also has difficulty bending over.  Can you think of a goal you would like to reach for your health?  Patient stated she would like to lose weight and get to her goal weight of 200 lbs   Do you have any problems getting your medications?  Patient stated she is unable to comfortably afford medications when in the donut hole. At this time some medications are costing little to nothing but others are more expensive.  She stated that after purchasing medications she is unable to purchase groceries for her household. A patient assistance application was prepared and reviewed for Ozempic and Eliquis and will be mailed to patient.   Is there anything that you would like to discuss during the appointment?  Patient stated she would like to discuss consolidating BP medications as she is taking 5 which she doesn't believe is necessary. She would also like to discuss medication costs, modifications to aid in weight loss, Bp monitor options,  and advice for back pain  Please bring medications and supplements to appointment Reminded patient of initial phone visit with CPP 07/26  at 9 am   [employer], CMA

## 2020-12-23 ENCOUNTER — Telehealth: Payer: Self-pay

## 2020-12-23 ENCOUNTER — Ambulatory Visit (INDEPENDENT_AMBULATORY_CARE_PROVIDER_SITE_OTHER): Payer: Medicare HMO

## 2020-12-23 DIAGNOSIS — E785 Hyperlipidemia, unspecified: Secondary | ICD-10-CM | POA: Diagnosis not present

## 2020-12-23 DIAGNOSIS — E1122 Type 2 diabetes mellitus with diabetic chronic kidney disease: Secondary | ICD-10-CM | POA: Diagnosis not present

## 2020-12-23 DIAGNOSIS — I152 Hypertension secondary to endocrine disorders: Secondary | ICD-10-CM

## 2020-12-23 DIAGNOSIS — F321 Major depressive disorder, single episode, moderate: Secondary | ICD-10-CM | POA: Diagnosis not present

## 2020-12-23 DIAGNOSIS — N184 Chronic kidney disease, stage 4 (severe): Secondary | ICD-10-CM | POA: Diagnosis not present

## 2020-12-23 DIAGNOSIS — E1159 Type 2 diabetes mellitus with other circulatory complications: Secondary | ICD-10-CM

## 2020-12-23 DIAGNOSIS — I480 Paroxysmal atrial fibrillation: Secondary | ICD-10-CM | POA: Diagnosis not present

## 2020-12-23 DIAGNOSIS — E1169 Type 2 diabetes mellitus with other specified complication: Secondary | ICD-10-CM | POA: Diagnosis not present

## 2020-12-23 NOTE — Telephone Encounter (Signed)
Copied from [place] [id number]. Topic: Quick Communication - Rx Refill/Question >> Dec 22, 2020  3:55 PM Pawlus, Brayton Layman A wrote: Pt stated [facility] denied her refill of Semaglutide,0.25 or 0.5MG /DOS, (OZEMPIC, 0.25 OR 0.5 MG/DOSE,) 2 MG/1.5ML SOPN, pt has not been able to pick up her prescription.

## 2020-12-23 NOTE — Patient Instructions (Addendum)
Cindy [name],  Thank you for talking with me today. I have included our care plan/goals in the following pages.   Please review and call me at [phone] with any questions.  Thanks! [name]    Patient Care Plan: CCM Pharmacy Care Plan     Problem Identified: T2DM, MDD, HLD, HTN, IDA, GERD, OSA, Obesity   Priority: High     Long-Range Goal: Disease Management   Start Date: 12/23/2020  Expected End Date: 12/23/2021  This Visit's Progress: On track  Priority: High  Note:   Current Barriers:  Medication affordability for diabetes and afib medications  Pharmacist Clinical Goal(s):  Patient will contact provider office for questions/concerns as evidenced notation of same in electronic health record through collaboration with PharmD and provider.   Interventions: 1:1 collaboration with Venita Lick, NP regarding development and update of comprehensive plan of care as evidenced by provider attestation and co-signature Inter-disciplinary care team collaboration (see longitudinal plan of care) Comprehensive medication review performed; medication list updated in electronic medical record  Hypertension (BP goal <130/80) -Controlled -Tolerating well for the most part. Did note some occasional symptomatic low BP at home. Has not been recurrent, will let us know if becomes recurrent. -Current treatment: Diltiazem 360 mg CD 24H once daily  Furosemide 80 mg twice daily Metoprolol succinate 100 mg tablet - whole tablet (100 mg) every morning, half tablet (50 mg) every evening Minoxidil 10 mg once daily Spironolactone 25 mg once daily  -Medications previously tried: amlodipine  -Current home readings: 154M-086P systolic -Current dietary habits: limited appetite, will try to eat every 3 hours  -Current exercise habits: walks as tolerated w/ SOB and back pain. Has upcoming echo and pfts. -Denies hypotensive/hypertensive symptoms -Educated on Symptoms of hypotension and importance of  maintaining adequate hydration; -Counseled to monitor BP at home 3x/week, document, and provide log at future appointments -Recommended to continue current medication  Hyperlipidemia: (LDL goal < 70) -Not ideally controlled -Current treatment: Atorvastatin 40 mg daily  -Educated on Cholesterol goals;  Benefits of statin for ASCVD risk reduction; -Recommended to continue current medication Could consider atorvastatin dose increase to 61m due to LDL >70 and several CV risk factors if LDL elevated at follow-up.  Diabetes (A1c goal <7%) -Not ideally controlled -Recent GFRs 34 12/03/2020 and 27 11/19/20 -Current medications: Metformin 500 mg twice daily (reduced 11/2020) Ozempic 0.25 mg injection once weekly for four weeks then increase to 0.5 mg once weekly -Medications previously tried: Jardiance (GFR/Cost), glimepiride, pioglitazone   -Current home glucose readings fasting glucose: 120s -Denies hypoglycemic/hyperglycemic symptoms -Educated on A1c and blood sugar goals; -Counseled to check feet daily and get yearly eye exams -Ozempic patient assistance paper work will be sent out.  -Recommended to continue current medication  Patient Goals/Self-Care Activities Patient will:  - take medications as prescribed  Medication Assistance: Application for Ozempic  medication assistance program. in process.  Anticipated assistance start date 12/2020.  See plan of care for additional detail. Will also check on Eliquis patient assistance. Patient has not heard back after dropping off paper work, would have been submitted 1 month ago per patient.      The patient was given the following information about Chronic Care Management services today, agreed to services, and gave verbal consent: 1. CCM service includes personalized support from designated clinical staff supervised by the primary care provider, including individualized plan of care and coordination with other care providers 2. 24/7 contact  phone numbers for assistance for urgent and routine  care needs. 3. Service will only be billed when office clinical staff spend 20 minutes or more in a month to coordinate care. 4. Only one practitioner may furnish and bill the service in a calendar month. 5.The patient may stop CCM services at any time (effective at the end of the month) by phone call to the office staff. 6. The patient will be responsible for cost sharing (co-pay) of up to 20% of the service fee (after annual deductible is met). Patient agreed to services and consent obtained.  The patient verbalized understanding of instructions provided today and agreed to receive a mailed copy of patient instruction and/or educational materials. Telephone follow up appointment with pharmacy team member scheduled for: See next appointment with "Care Management Staff" under "What's Next" below.   Hypertension, Adult High blood pressure (hypertension) is when the force of blood pumping through the arteries is too strong. The arteries are the blood vessels that carry blood from the heart throughout the body. Hypertension forces the heart to work harder to pump blood and may cause arteries to become narrow or stiff. Untreated or uncontrolled hypertension can cause a heart attack, heart failure, a stroke, kidney disease, and otherproblems. A blood pressure reading consists of a higher number over a lower number. Ideally, your blood pressure should be below 120/80. The first ("top") number is called the systolic pressure. It is a measure of the pressure in your arteries as your heart beats. The second ("bottom") number is called the diastolic pressure. It is a measure of the pressure in your arteries as theheart relaxes. What are the causes? The exact cause of this condition is not known. There are some conditions thatresult in or are related to high blood pressure. What increases the risk? Some risk factors for high blood pressure are under your control.  The following factors may make you more likely to develop this condition: Smoking. Having type 2 diabetes mellitus, high cholesterol, or both. Not getting enough exercise or physical activity. Being overweight. Having too much fat, sugar, calories, or salt (sodium) in your diet. Drinking too much alcohol. Some risk factors for high blood pressure may be difficult or impossible to change. Some of these factors include: Having chronic kidney disease. Having a family history of high blood pressure. Age. Risk increases with age. Race. You may be at higher risk if you are African American. Gender. Men are at higher risk than women before age 57. After age 1, women are at higher risk than men. Having obstructive sleep apnea. Stress. What are the signs or symptoms? High blood pressure may not cause symptoms. Very high blood pressure (hypertensive crisis) may cause: Headache. Anxiety. Shortness of breath. Nosebleed. Nausea and vomiting. Vision changes. Severe chest pain. Seizures. How is this diagnosed? This condition is diagnosed by measuring your blood pressure while you are seated, with your arm resting on a flat surface, your legs uncrossed, and your feet flat on the floor. The cuff of the blood pressure monitor will be placed directly against the skin of your upper arm at the level of your heart. It should be measured at least twice using the same arm. Certain conditions cancause a difference in blood pressure between your right and left arms. Certain factors can cause blood pressure readings to be lower or higher than normal for a short period of time: When your blood pressure is higher when you are in a health care provider's office than when you are at home, this is called white coat  hypertension. Most people with this condition do not need medicines. When your blood pressure is higher at home than when you are in a health care provider's office, this is called masked hypertension. Most  people with this condition may need medicines to control blood pressure. If you have a high blood pressure reading during one visit or you have normal blood pressure with other risk factors, you may be asked to: Return on a different day to have your blood pressure checked again. Monitor your blood pressure at home for 1 week or longer. If you are diagnosed with hypertension, you may have other blood or imaging tests to help your health care provider understand your overall risk for otherconditions. How is this treated? This condition is treated by making healthy lifestyle changes, such as eating healthy foods, exercising more, and reducing your alcohol intake. Your health care provider may prescribe medicine if lifestyle changes are not enough to get your blood pressure under control, and if: Your systolic blood pressure is above 130. Your diastolic blood pressure is above 80. Your personal target blood pressure may vary depending on your medicalconditions, your age, and other factors. Follow these instructions at home: Eating and drinking  Eat a diet that is high in fiber and potassium, and low in sodium, added sugar, and fat. An example eating plan is called the DASH (Dietary Approaches to Stop Hypertension) diet. To eat this way: Eat plenty of fresh fruits and vegetables. Try to fill one half of your plate at each meal with fruits and vegetables. Eat whole grains, such as whole-wheat pasta, brown rice, or whole-grain bread. Fill about one fourth of your plate with whole grains. Eat or drink low-fat dairy products, such as skim milk or low-fat yogurt. Avoid fatty cuts of meat, processed or cured meats, and poultry with skin. Fill about one fourth of your plate with lean proteins, such as fish, chicken without skin, beans, eggs, or tofu. Avoid pre-made and processed foods. These tend to be higher in sodium, added sugar, and fat. Reduce your daily sodium intake. Most people with hypertension  should eat less than 1,500 mg of sodium a day. Do not drink alcohol if: Your health care provider tells you not to drink. You are pregnant, may be pregnant, or are planning to become pregnant. If you drink alcohol: Limit how much you use to: 0-1 drink a day for women. 0-2 drinks a day for men. Be aware of how much alcohol is in your drink. In the [place] one drink equals one 12 oz bottle of beer (355 mL), one 5 oz glass of wine (148 mL), or one 1 oz glass of hard liquor (44 mL).  Lifestyle  Work with your health care provider to maintain a healthy body weight or to lose weight. Ask what an ideal weight is for you. Get at least 30 minutes of exercise most days of the week. Activities may include walking, swimming, or biking. Include exercise to strengthen your muscles (resistance exercise), such as Pilates or lifting weights, as part of your weekly exercise routine. Try to do these types of exercises for 30 minutes at least 3 days a week. Do not use any products that contain nicotine or tobacco, such as cigarettes, e-cigarettes, and chewing tobacco. If you need help quitting, ask your health care provider. Monitor your blood pressure at home as told by your health care provider. Keep all follow-up visits as told by your health care provider. This is important.  Medicines Take over-the-counter  and prescription medicines only as told by your health care provider. Follow directions carefully. Blood pressure medicines must be taken as prescribed. Do not skip doses of blood pressure medicine. Doing this puts you at risk for problems and can make the medicine less effective. Ask your health care provider about side effects or reactions to medicines that you should watch for. Contact a health care provider if you: Think you are having a reaction to a medicine you are taking. Have headaches that keep coming back (recurring). Feel dizzy. Have swelling in your ankles. Have trouble with your  vision. Get help right away if you: Develop a severe headache or confusion. Have unusual weakness or numbness. Feel faint. Have severe pain in your chest or abdomen. Vomit repeatedly. Have trouble breathing. Summary Hypertension is when the force of blood pumping through your arteries is too strong. If this condition is not controlled, it may put you at risk for serious complications. Your personal target blood pressure may vary depending on your medical conditions, your age, and other factors. For most people, a normal blood pressure is less than 120/80. Hypertension is treated with lifestyle changes, medicines, or a combination of both. Lifestyle changes include losing weight, eating a healthy, low-sodium diet, exercising more, and limiting alcohol. This information is not intended to replace advice given to you by your health care provider. Make sure you discuss any questions you have with your healthcare provider. Document Revised: 01/25/2018 Document Reviewed: 01/25/2018 [employer] Patient Education  [place].

## 2020-12-23 NOTE — Telephone Encounter (Signed)
Spoke to patient regarding medication, pharmacy states she is in a donut hole and medication is very expensive. Made patient aware provider has referred her to the CCM team for assistance. Has apt tomorrow for initial visit.

## 2020-12-23 NOTE — Telephone Encounter (Signed)
Attempted to call patient, no answer/unable to leave VM. 

## 2020-12-24 ENCOUNTER — Ambulatory Visit: Payer: Self-pay | Admitting: General Practice

## 2020-12-24 ENCOUNTER — Telehealth: Payer: Medicare HMO | Admitting: General Practice

## 2020-12-24 DIAGNOSIS — I152 Hypertension secondary to endocrine disorders: Secondary | ICD-10-CM

## 2020-12-24 DIAGNOSIS — E1159 Type 2 diabetes mellitus with other circulatory complications: Secondary | ICD-10-CM

## 2020-12-24 DIAGNOSIS — F321 Major depressive disorder, single episode, moderate: Secondary | ICD-10-CM

## 2020-12-24 DIAGNOSIS — E785 Hyperlipidemia, unspecified: Secondary | ICD-10-CM

## 2020-12-24 DIAGNOSIS — E1122 Type 2 diabetes mellitus with diabetic chronic kidney disease: Secondary | ICD-10-CM

## 2020-12-24 DIAGNOSIS — E1169 Type 2 diabetes mellitus with other specified complication: Secondary | ICD-10-CM

## 2020-12-24 DIAGNOSIS — G894 Chronic pain syndrome: Secondary | ICD-10-CM

## 2020-12-24 DIAGNOSIS — N184 Chronic kidney disease, stage 4 (severe): Secondary | ICD-10-CM

## 2020-12-24 DIAGNOSIS — I480 Paroxysmal atrial fibrillation: Secondary | ICD-10-CM

## 2020-12-24 NOTE — Chronic Care Management (AMB) (Signed)
Chronic Care Management   CCM RN Visit Note  12/24/2020 Name: Cindy [name] MRN: [id number] DOB: Oct 03, 1950  Subjective: Cindy [name] is a 70 y.o. year old female who is a primary care patient of Cannady, Barbaraann Faster, NP. The care management team was consulted for assistance with disease management and care coordination needs.    Engaged with patient by telephone for initial visit in response to provider referral for case management and/or care coordination services.   Consent to Services:  The patient was given the following information about Chronic Care Management services today, agreed to services, and gave verbal consent: 1. CCM service includes personalized support from designated clinical staff supervised by the primary care provider, including individualized plan of care and coordination with other care providers 2. 24/7 contact phone numbers for assistance for urgent and routine care needs. 3. Service will only be billed when office clinical staff spend 20 minutes or more in a month to coordinate care. 4. Only one practitioner may furnish and bill the service in a calendar month. 5.The patient may stop CCM services at any time (effective at the end of the month) by phone call to the office staff. 6. The patient will be responsible for cost sharing (co-pay) of up to 20% of the service fee (after annual deductible is met). Patient agreed to services and consent obtained.  Patient agreed to services and verbal consent obtained.   Assessment: Review of patient past medical history, allergies, medications, health status, including review of consultants reports, laboratory and other test data, was performed as part of comprehensive evaluation and provision of chronic care management services.   SDOH (Social Determinants of Health) assessments and interventions performed:    CCM Care Plan  Allergies  Allergen Reactions   Bupropion Other (See Comments)   Gabapentin Palpitations    Pioglitazone Palpitations   Vicodin [Hydrocodone-Acetaminophen] Other (See Comments)    Reaction: racing heart   Vicoprofen [Hydrocodone-Ibuprofen] Other (See Comments)    Reaction: racing heart    Outpatient Encounter Medications as of 12/24/2020  Medication Sig   acetaminophen (TYLENOL) 650 MG CR tablet Take 650 mg by mouth every 8 (eight) hours as needed for pain.   albuterol (VENTOLIN HFA) 108 (90 Base) MCG/ACT inhaler Inhale 1-2 puffs into the lungs every 6 (six) hours as needed for wheezing or shortness of breath.   apixaban (ELIQUIS) 5 MG TABS tablet Take 1 tablet (5 mg total) by mouth 2 (two) times daily.   atorvastatin (LIPITOR) 40 MG tablet Take 40 mg by mouth daily.   cetirizine (ZYRTEC) 10 MG tablet Take 10 mg by mouth daily.   diltiazem (CARDIZEM CD) 360 MG 24 hr capsule Take 360 mg by mouth daily.   DULoxetine (CYMBALTA) 60 MG capsule Take 60 mg by mouth daily.   ferrous sulfate 325 (65 FE) MG tablet Take 1 tablet (325 mg total) by mouth 2 (two) times daily with a meal.   fluticasone (FLONASE) 50 MCG/ACT nasal spray Place 2 sprays into both nostrils daily.   furosemide (LASIX) 80 MG tablet Take 80 mg by mouth 2 (two) times daily.   lisinopril (PRINIVIL,ZESTRIL) 40 MG tablet Take 40 mg by mouth daily.   metFORMIN (GLUCOPHAGE) 1000 MG tablet Take 500 mg by mouth 2 (two) times daily with a meal.   metoprolol succinate (TOPROL-XL) 100 MG 24 hr tablet Take 50-100 mg by mouth 2 (two) times daily. Take 1 tablet in the morning and one-half tablet in the evening. Take with or  immediately following a meal.   minoxidil (LONITEN) 10 MG tablet Take 10 mg by mouth daily.   montelukast (SINGULAIR) 10 MG tablet Take 10 mg by mouth daily as needed (allergies).    Multiple Vitamin (MULTIVITAMIN WITH MINERALS) TABS tablet Take 1 tablet by mouth daily.   omeprazole (PRILOSEC) 20 MG capsule Take 20 mg by mouth daily as needed (heartburn).    polyethylene glycol powder (GLYCOLAX/MIRALAX) powder Take  17 g by mouth daily.    Semaglutide,0.25 or 0.5MG/DOS, (OZEMPIC, 0.25 OR 0.5 MG/DOSE,) 2 MG/1.5ML SOPN Start with 0.25MG once a week x 4 weeks, then increase to 0.5MG weekly.   spironolactone (ALDACTONE) 25 MG tablet Take 25 mg by mouth daily.   No facility-administered encounter medications on file as of 12/24/2020.    Patient Active Problem List   Diagnosis Date Noted   Post-COVID syndrome 12/03/2020   Recurrent boils 12/03/2020   Morbid obesity ([place]) 12/03/2020   CKD stage 4 due to type 2 diabetes mellitus ([place]) 12/01/2020   Aortic atherosclerosis ([place]) 12/01/2020   Hypertension associated with diabetes ([place]) 07/15/2020   A-fib ([place]) 07/15/2020   Chronic pain syndrome 07/15/2020   Depression, major, single episode, moderate ([place]) 07/15/2020   Type 2 diabetes mellitus with morbid obesity ([place]) 07/15/2020   GERD (gastroesophageal reflux disease) 07/15/2020   Hyperlipidemia associated with type 2 diabetes mellitus ([place]) 07/15/2020   OSA (obstructive sleep apnea) 07/15/2020   Other thrombophilia ([place]) 03/12/2020   History of lumbar spinal fusion 04/26/2018   Diuretic-induced hypokalemia 03/24/2018   Hypomagnesemia 03/24/2018   Hypertensive retinopathy of both eyes 06/08/2017   DDD (degenerative disc disease), lumbar 10/31/2014   Cervical spondylosis with myelopathy 02/22/2014   Iron deficiency anemia 02/22/2014   Mitral regurgitation 09/08/2013    Conditions to be addressed/monitored:Atrial Fibrillation, HTN, HLD, DMII, CKD Stage 4, Depression, and chronic pain   Care Plan : RNCM: General plan of care for AFIB, HTN, HLD, CKD4, DM, Chronic pain, and Depression  Updates made by Vanita Ingles since 12/24/2020 12:00 AM     Problem: RNCM: General plan of care for AFIB, HTN, HLD, CKD4, DM, Chronic pain, and Depression   Priority: High     Long-Range Goal: RNCM: General plan of care for AFIB, HTN, HLD, CKD4, DM, Chronic pain, and Depression   Start Date: 12/24/2020  Expected End  Date: 12/24/2021  This Visit's Progress: On track  Priority: High  Note:   Current Barriers:  Knowledge Deficits related to plan of care for management of Atrial Fibrillation, HTN, HLD, DMII, CKD Stage 4, Depression, and chronic pain   Care Coordination needs related to Financial constraints related to difficulty in affording medications , Level of care concerns, and Mental Health Concerns  in a patient with Atrial Fibrillation, HTN, HLD, DMII, CKD Stage 4, Depression, and chronic pain  Chronic Disease Management support and education needs related to Atrial Fibrillation, HTN, HLD, DMII, CKD Stage 4, Depression, and chronic pain  Difficulty obtaining medications  RNCM Clinical Goal(s):  Patient will verbalize understanding of plan for management of Atrial Fibrillation, HTN, HLD, DMII, CKD Stage 4, Depression, and chronic pain  work with All embedded care coordination to address needs related to Atrial Fibrillation, HTN, HLD, DMII, CKD Stage 4, Depression, and chronic pain  and Financial constraints related to medication cost  and Mental Health Concerns  take all medications exactly as prescribed and will call provider for medication related questions attend all scheduled medical appointments: 03-18-2021 at 1120 am demonstrate a decrease  in Atrial Fibrillation, HTN, HLD, DMII, CKD Stage 4, Depression, and Chronic pain  exacerbations demonstrate improved adherence to prescribed treatment plan for Atrial Fibrillation, HTN, HLD, DMII, CKD Stage 4, Depression, and chronic pain  demonstrate improved health management independence verbalize basic understanding of Atrial Fibrillation, HTN, HLD, DMII, CKD Stage 4, Depression, and Chronic pain  disease process and self health management plan demonstrate understanding of rationale for each prescribed medication work with CM clinical social worker to help with building coping skills and handle stress over multiple chronic conditions  demonstrate ongoing self  health care management ability through collaboration with Consulting civil engineer, provider, and care team.   Interventions: 1:1 collaboration with primary care provider regarding development and update of comprehensive plan of care as evidenced by provider attestation and co-signature Inter-disciplinary care team collaboration (see longitudinal plan of care)   A-fib:  (Status: New goal. Goal on track: YES.) Counseled on increased risk of stroke due to Afib and benefits of anticoagulation for stroke prevention; Reviewed importance of adherence to anticoagulant exactly as prescribed; Advised patient to discuss changes in AFIB, concerns or questions  with provider; Counseled on bleeding risk associated with Eliquis and importance of self-monitoring for signs/symptoms of bleeding; Counseled on avoidance of NSAIDs due to increased bleeding risk with anticoagulants; Counseled on importance of regular laboratory monitoring as prescribed; Counseled on seeking medical attention after a head injury or if there is blood in the urine/stool; Afib action plan reviewed;  Diabetes:  (Status: New goal. Goal on track: YES.) Lab Results  Component Value Date   HGBA1C 7.6 (H) 12/03/2020  Assessed patient's understanding of A1c goal: <7% Provided education to patient about basic DM disease process; Reviewed medications with patient and discussed importance of medication adherence; Counseled on importance of regular laboratory monitoring as prescribed; Discussed plans with patient for ongoing care management follow up and provided patient with direct contact information for care management team; Provided patient with written educational materials related to hypo and hyperglycemia and importance of correct treatment; Reviewed scheduled/upcoming provider appointments including: 03-18-2021 at 1120 am; Advised patient, providing education and rationale, to check cbg bid and record, calling pcp for findings outside  established parameters; Referral made to pharmacy team for assistance with obtaining financial assistance with Ozempic and other medications, patient spoke to pharmacist on 12-23-2020; Review of patient status, including review of consultants reports, relevant laboratory and other test results, and medications completed; Advised patient to discuss questions or concern about her DM  with provider, the patient was upset that she "messed up" her Ozempic dose this am and it was a wasted dose. Reflective listening and support given. Education on how to take [place]. Her daughters came and assisted her and will assist again next week until she is comfortable giving the Ozempic to herself. The patient is a little concerned about the paperwork and feels the insurance will not pay for the Ozempic. Reviewed conversation she had with pharm D on 12-23-2020, per the notes the pharm D is working with the patient to help with financial constraints of medications, including Ozempic;  Hyperlipidemia:  (Status: New goal. Goal on track: YES.) Lab Results  Component Value Date   CHOL 152 12/03/2020   Lab Results  Component Value Date   HDL 43 12/03/2020   Lab Results  Component Value Date   LDLCALC 87 12/03/2020   Lab Results  Component Value Date   TRIG 123 12/03/2020  No results found for: CHOLHDL No results found for: LDLDIRECT  Medication  review performed; medication list updated in electronic medical record. Takes Lipitor 40 mg for HLD control  Provider established cholesterol goals reviewed; Counseled on importance of regular laboratory monitoring as prescribed; Provided HLD educational materials; Reviewed role and benefits of statin for ASCVD risk reduction; Discussed strategies to manage statin-induced myalgias; Reviewed importance of limiting foods high in cholesterol;  Hypertension: (Status: New goal. Goal on track: YES.) Last practice recorded BP readings:  BP Readings from Last 3 Encounters:   12/17/20 (!) 128/55  12/03/20 (!) 126/56  11/20/20 (!) 149/60  Most recent eGFR/CrCl:  Lab Results  Component Value Date   EGFR 34 (L) 12/03/2020    No components found for: CRCL  Evaluation of current treatment plan related to hypertension self management and patient's adherence to plan as established by provider. The patient states that sometimes her blood pressure goes lower but states recently her systolic has been 659 to 935 range and diastolic 50 to 60. Says that she does get light headed some times and dizzy but not recently. Discussed changing position slowly and monitoring for orthostatic hypotension. Explained what orthostatic hypotension was and to be safe when changing positions due to the possibility of blood pressure dropping.  Provided education to patient re: stroke prevention, s/s of heart attack and stroke; Reviewed medications with patient and discussed importance of compliance; Discussed plans with patient for ongoing care management follow up and provided patient with direct contact information for care management team; Advised patient, providing education and rationale, to monitor blood pressure daily and record, calling PCP for findings outside established parameters;  Reviewed scheduled/upcoming provider appointments including: 03-18-2021 at 1120 am Provided education on prescribed diet heart healthy/ADA diet ;  Discussed complications of poorly controlled blood pressure such as heart disease, stroke, circulatory complications, vision complications, kidney impairment, sexual dysfunction;    CKD4 (Status: New goal. Goal on track: YES.) Evaluation of current treatment plan related to CKD Stage 4 ,  self-management and patient's adherence to plan as established by provider. Discussed plans with patient for ongoing care management follow up and provided patient with direct contact information for care management team Evaluation of current treatment plan related to CKD4 and  patient's adherence to plan as established by provider; Advised patient to call the office for changes in condition, questions or concerns; Provided education to patient re: staying hydrated, eating healthy, pacing activity, keeping follow up with pcp and specialist; Reviewed medications with patient and discussed compliance ; Reviewed scheduled/upcoming provider appointments including 03-18-2021 at 1120 am; Discussed plans with patient for ongoing care management follow up and provided patient with direct contact information for care management team;   Depression (Status: New goal. Goal on track: YES.) Evaluation of current treatment plan related to Depression, Mental Health Concerns  self-management and patient's adherence to plan as established by provider. Discussed plans with patient for ongoing care management follow up and provided patient with direct contact information for care management team Evaluation of current treatment plan related to depression  and patient's adherence to plan as established by provider; Advised patient to call the office for changes in mood, anxiety, or increased episodes of depression; Provided education to patient re: working with CCM team to effectively manage her depression; Reviewed medications with patient and discussed compliance ; Reviewed scheduled/upcoming provider appointments including: 03-18-2021 at 1120 am Social Work referral for assistance with coping skills and recommendations for effective management of depression; Discussed plans with patient for ongoing care management follow up and provided patient with direct  contact information for care management team; Screening for signs and symptoms of depression related to chronic disease state;  Assessed social determinant of health barriers;  Pain:  (Status: New goal. Goal on track: YES.) Pain assessment performed Medications reviewed Reviewed provider established plan for pain  management; Discussed importance of adherence to all scheduled medical appointments; Counseled on the importance of reporting any/all new or changed pain symptoms or management strategies to pain management provider; Advised patient to report to care team affect of pain on daily activities; Discussed use of relaxation techniques and/or diversional activities to assist with pain reduction (distraction, imagery, relaxation, massage, acupressure, TENS, heat, and cold application; Reviewed with patient prescribed pharmacological and nonpharmacological pain relief strategies; Advised patient to discuss increased level or intensity of pain or uncontrolled pain  with provider;  Patient Goals/Self-Care Activities: Patient will self administer medications as prescribed Patient will attend all scheduled provider appointments Patient will call pharmacy for medication refills Patient will attend church or other social activities Patient will continue to perform ADL's independently Patient will continue to perform IADL's independently Patient will call provider office for new concerns or questions  Follow Up Plan: Telephone follow up appointment with care management team member scheduled for: 01-27-2021 at 1 pm      Plan:Telephone follow up appointment with care management team member scheduled for:  01-27-2021 at 1 pm  [place], MSN, [facility] Mobile: [phone]

## 2020-12-24 NOTE — Patient Instructions (Signed)
Visit Information   PATIENT GOALS:   Goals Addressed             This Visit's Progress    RNCM: Manage Chronic Pain       Timeframe:  Long-Range Goal Priority:  High Start Date:    12-24-2020                         Expected End Date:  12-24-2021                     Follow Up Date 01/27/2021    - call for medicine refill 2 or 3 days before it runs out - develop a personal pain management plan - keep track of prescription refills - plan exercise or activity when pain is best controlled - prioritize tasks for the day - track times pain is worst and when it is best - track what makes the pain worse and what makes it better - use ice or heat for pain relief - work slower and less intense when having pain    Why is this important?   Day-to-day life can be hard when you have chronic pain.  Pain medicine is just one piece of the treatment puzzle.  You can try these action steps to help you manage your pain.    Notes: 12-24-2020: The patient states that she has pain a lot but it is not bad every day. She states lots of times the pain in in her back and goes down her hip and leg. She takes one or two tylenol for pain relief. She also takes Cymbalta. She will use heat and cold application when needed, she states this is helpful. Denies any acute pain today. Will continue to monitor.      RNCM: Manage My Emotions       Timeframe:  Long-Range Goal Priority:  High Start Date:    12-24-2020                         Expected End Date:     12-24-2021                  Follow Up Date 01/27/2021    - call and visit an old friend - laugh; watch a funny movie or comedian - learn and use visualization or guided imagery - perform a random act of kindness - practice relaxation or meditation daily - talk about feelings with a friend, family or spiritual advisor - practice positive thinking and self-talk    Why is this important?   When you are stressed, down or upset, your body reacts too.  For  example, your blood pressure may get higher; you may have a headache or stomachache.  When your emotions get the best of you, your body's ability to fight off cold and flu gets weak.  These steps will help you manage your emotions.     Notes: 12-24-2020: The patient says that she is stressed out about her health right now. Since having COVID she has had set backs in her condition. She still does not have taste or smell but is eating. She is appreciative of CCM team and helping her with questions and concerns she has. She denies any acute distress. Is taking medications as directed. Will continue to monitor.      RNCM: Manage My Medicine       Timeframe:  Long-Range Goal Priority:  High Start Date:      12-24-2020                       Expected End Date:       12-24-2021                Follow Up Date 01/27/2021    - call for medicine refill 2 or 3 days before it runs out - call if I am sick and can't take my medicine - keep a list of all the medicines I take; vitamins and herbals too - learn to read medicine labels - use a pillbox to sort medicine - use an alarm clock or phone to remind me to take my medicine    Why is this important?   These steps will help you keep on track with your medicines.   Notes: 12-24-2020: The patient spoke to the pharm D on 12-23-2020, they are working with her for assistance with some of her medications. The patient was upset with herself this am because she was trying to give herself her first dose of Ozempic and she forgot to take the cap off and wasted it in the tube. Her daughters came over and helped her with the dose. She said I watched carefully last week when Jolene gave it to me and I thought I was doing it right. Reflective listening and encouragement given to the patient. Explained this happens to all of [place] and let her talk her feelings out. Will continue to work with patient for education and support.      RNCM: Track and Manage My Symptoms-Depression        Timeframe:  Long-Range Goal Priority:  High Start Date:     12-24-2020                        Expected End Date:        12-24-2021               Follow Up Date 01/27/2021    - avoid negative self-talk - develop a personal safety plan - develop a plan to deal with triggers like holidays, anniversaries - exercise at least 2 to 3 times per week - have a plan for how to handle bad days - journal feelings and what helps to feel better or worse - spend time or talk with others at least 2 to 3 times per week - spend time or talk with others every day - watch for early signs of feeling worse    Why is this important?   Keeping track of your progress will help your treatment team find the right mix of medicine and therapy for you.  Write in your journal every day.  Day-to-day changes in depression symptoms are normal. It may be more helpful to check your progress at the end of each week instead of every day.     Notes: 12-24-2020: The patient has a good family support system. She has had a hard year and wants to improve her health and well being. She states she has to work through things and she is thankful for the help and support she is getting.          CLINICAL CARE PLAN: Patient Care Plan: CCM Pharmacy Care Plan     Problem Identified: T2DM, MDD, HLD, HTN, IDA, GERD, OSA, Obesity   Priority: High     Long-Range Goal: Disease Management  Start Date: 12/23/2020  Expected End Date: 12/23/2021  This Visit's Progress: On track  Priority: High  Note:   Current Barriers:  Medication affordability for diabetes and afib medications  Pharmacist Clinical Goal(s):  Patient will contact provider office for questions/concerns as evidenced notation of same in electronic health record through collaboration with PharmD and provider.   Interventions: 1:1 collaboration with Venita Lick, NP regarding development and update of comprehensive plan of care as evidenced by provider  attestation and co-signature Inter-disciplinary care team collaboration (see longitudinal plan of care) Comprehensive medication review performed; medication list updated in electronic medical record  Hypertension (BP goal <130/80) -Controlled -Tolerating well for the most part. Did note some occasional symptomatic low BP at home. Has not been recurrent, will let us know if becomes recurrent. -Current treatment: Diltiazem 360 mg CD 24H once daily  Furosemide 80 mg twice daily Metoprolol succinate 100 mg tablet - whole tablet (100 mg) every morning, half tablet (50 mg) every evening Minoxidil 10 mg once daily Spironolactone 25 mg once daily  -Medications previously tried: amlodipine  -Current home readings: [id number] systolic -Current dietary habits: limited appetite, will try to eat every 3 hours  -Current exercise habits: walks as tolerated w/ SOB and back pain. Has upcoming echo and pfts. -Denies hypotensive/hypertensive symptoms -Educated on Symptoms of hypotension and importance of maintaining adequate hydration; -Counseled to monitor BP at home 3x/week, document, and provide log at future appointments -Recommended to continue current medication  Hyperlipidemia: (LDL goal < 70) -Not ideally controlled -Current treatment: Atorvastatin 40 mg daily  -Educated on Cholesterol goals;  Benefits of statin for ASCVD risk reduction; -Recommended to continue current medication Could consider atorvastatin dose increase to 53m due to LDL >70 and several CV risk factors if LDL elevated at follow-up.  Diabetes (A1c goal <7%) -Not ideally controlled -Recent GFRs 34 12/03/2020 and 27 11/19/20 -Current medications: Metformin 500 mg twice daily (reduced 11/2020) Ozempic 0.25 mg injection once weekly for four weeks then increase to 0.5 mg once weekly -Medications previously tried: Jardiance (GFR/Cost), glimepiride, pioglitazone   -Current home glucose readings fasting glucose: 120s -Denies  hypoglycemic/hyperglycemic symptoms -Educated on A1c and blood sugar goals; -Counseled to check feet daily and get yearly eye exams -Ozempic patient assistance paper work will be sent out.  -Recommended to continue current medication  Patient Goals/Self-Care Activities Patient will:  - take medications as prescribed  Medication Assistance: Application for Ozempic  medication assistance program. in process.  Anticipated assistance start date 12/2020.  See plan of care for additional detail. Will also check on Eliquis patient assistance. Patient has not heard back after dropping off paper work, would have been submitted 1 month ago per patient.     Patient Care Plan: RNCM: General plan of care for AFIB, HTN, HLD, CKD4, DM, Chronic pain, and Depression     Problem Identified: RNCM: General plan of care for AFIB, HTN, HLD, CKD4, DM, Chronic pain, and Depression   Priority: High     Long-Range Goal: RNCM: General plan of care for AFIB, HTN, HLD, CKD4, DM, Chronic pain, and Depression   Start Date: 12/24/2020  Expected End Date: 12/24/2021  This Visit's Progress: On track  Priority: High  Note:   Current Barriers:  Knowledge Deficits related to plan of care for management of Atrial Fibrillation, HTN, HLD, DMII, CKD Stage 4, Depression, and chronic pain   Care Coordination needs related to Financial constraints related to difficulty in affording medications , Level of care concerns, and  Mental Health Concerns  in a patient with Atrial Fibrillation, HTN, HLD, DMII, CKD Stage 4, Depression, and chronic pain  Chronic Disease Management support and education needs related to Atrial Fibrillation, HTN, HLD, DMII, CKD Stage 4, Depression, and chronic pain  Difficulty obtaining medications  RNCM Clinical Goal(s):  Patient will verbalize understanding of plan for management of Atrial Fibrillation, HTN, HLD, DMII, CKD Stage 4, Depression, and chronic pain  work with All embedded care coordination to  address needs related to Atrial Fibrillation, HTN, HLD, DMII, CKD Stage 4, Depression, and chronic pain  and Financial constraints related to medication cost  and Mental Health Concerns  take all medications exactly as prescribed and will call provider for medication related questions attend all scheduled medical appointments: 03-18-2021 at 1120 am demonstrate a decrease in Atrial Fibrillation, HTN, HLD, DMII, CKD Stage 4, Depression, and Chronic pain  exacerbations demonstrate improved adherence to prescribed treatment plan for Atrial Fibrillation, HTN, HLD, DMII, CKD Stage 4, Depression, and chronic pain  demonstrate improved health management independence verbalize basic understanding of Atrial Fibrillation, HTN, HLD, DMII, CKD Stage 4, Depression, and Chronic pain  disease process and self health management plan demonstrate understanding of rationale for each prescribed medication work with CM clinical social worker to help with building coping skills and handle stress over multiple chronic conditions  demonstrate ongoing self health care management ability through collaboration with Consulting civil engineer, provider, and care team.   Interventions: 1:1 collaboration with primary care provider regarding development and update of comprehensive plan of care as evidenced by provider attestation and co-signature Inter-disciplinary care team collaboration (see longitudinal plan of care)   A-fib:  (Status: New goal. Goal on track: YES.) Counseled on increased risk of stroke due to Afib and benefits of anticoagulation for stroke prevention; Reviewed importance of adherence to anticoagulant exactly as prescribed; Advised patient to discuss changes in AFIB, concerns or questions  with provider; Counseled on bleeding risk associated with Eliquis and importance of self-monitoring for signs/symptoms of bleeding; Counseled on avoidance of NSAIDs due to increased bleeding risk with anticoagulants; Counseled on  importance of regular laboratory monitoring as prescribed; Counseled on seeking medical attention after a head injury or if there is blood in the urine/stool; Afib action plan reviewed;  Diabetes:  (Status: New goal. Goal on track: YES.) Lab Results  Component Value Date   HGBA1C 7.6 (H) 12/03/2020  Assessed patient's understanding of A1c goal: <7% Provided education to patient about basic DM disease process; Reviewed medications with patient and discussed importance of medication adherence; Counseled on importance of regular laboratory monitoring as prescribed; Discussed plans with patient for ongoing care management follow up and provided patient with direct contact information for care management team; Provided patient with written educational materials related to hypo and hyperglycemia and importance of correct treatment; Reviewed scheduled/upcoming provider appointments including: 03-18-2021 at 1120 am; Advised patient, providing education and rationale, to check cbg bid and record, calling pcp for findings outside established parameters; Referral made to pharmacy team for assistance with obtaining financial assistance with Ozempic and other medications, patient spoke to pharmacist on 12-23-2020; Review of patient status, including review of consultants reports, relevant laboratory and other test results, and medications completed; Advised patient to discuss questions or concern about her DM  with provider, the patient was upset that she "messed up" her Ozempic dose this am and it was a wasted dose. Reflective listening and support given. Education on how to take [place]. Her daughters came and assisted her  and will assist again next week until she is comfortable giving the Ozempic to herself. The patient is a little concerned about the paperwork and feels the insurance will not pay for the Ozempic. Reviewed conversation she had with pharm D on 12-23-2020, per the notes the pharm D is working  with the patient to help with financial constraints of medications, including Ozempic;  Hyperlipidemia:  (Status: New goal. Goal on track: YES.) Lab Results  Component Value Date   CHOL 152 12/03/2020   Lab Results  Component Value Date   HDL 43 12/03/2020   Lab Results  Component Value Date   LDLCALC 87 12/03/2020   Lab Results  Component Value Date   TRIG 123 12/03/2020  No results found for: CHOLHDL No results found for: LDLDIRECT  Medication review performed; medication list updated in electronic medical record. Takes Lipitor 40 mg for HLD control  Provider established cholesterol goals reviewed; Counseled on importance of regular laboratory monitoring as prescribed; Provided HLD educational materials; Reviewed role and benefits of statin for ASCVD risk reduction; Discussed strategies to manage statin-induced myalgias; Reviewed importance of limiting foods high in cholesterol;  Hypertension: (Status: New goal. Goal on track: YES.) Last practice recorded BP readings:  BP Readings from Last 3 Encounters:  12/17/20 (!) 128/55  12/03/20 (!) 126/56  11/20/20 (!) 149/60  Most recent eGFR/CrCl:  Lab Results  Component Value Date   EGFR 34 (L) 12/03/2020    No components found for: CRCL  Evaluation of current treatment plan related to hypertension self management and patient's adherence to plan as established by provider. The patient states that sometimes her blood pressure goes lower but states recently her systolic has been 092 to 330 range and diastolic 50 to 60. Says that she does get light headed some times and dizzy but not recently. Discussed changing position slowly and monitoring for orthostatic hypotension. Explained what orthostatic hypotension was and to be safe when changing positions due to the possibility of blood pressure dropping.  Provided education to patient re: stroke prevention, s/s of heart attack and stroke; Reviewed medications with patient and discussed  importance of compliance; Discussed plans with patient for ongoing care management follow up and provided patient with direct contact information for care management team; Advised patient, providing education and rationale, to monitor blood pressure daily and record, calling PCP for findings outside established parameters;  Reviewed scheduled/upcoming provider appointments including: 03-18-2021 at 1120 am Provided education on prescribed diet heart healthy/ADA diet ;  Discussed complications of poorly controlled blood pressure such as heart disease, stroke, circulatory complications, vision complications, kidney impairment, sexual dysfunction;    CKD4 (Status: New goal. Goal on track: YES.) Evaluation of current treatment plan related to CKD Stage 4 ,  self-management and patient's adherence to plan as established by provider. Discussed plans with patient for ongoing care management follow up and provided patient with direct contact information for care management team Evaluation of current treatment plan related to CKD4 and patient's adherence to plan as established by provider; Advised patient to call the office for changes in condition, questions or concerns; Provided education to patient re: staying hydrated, eating healthy, pacing activity, keeping follow up with pcp and specialist; Reviewed medications with patient and discussed compliance ; Reviewed scheduled/upcoming provider appointments including 03-18-2021 at 1120 am; Discussed plans with patient for ongoing care management follow up and provided patient with direct contact information for care management team;   Depression (Status: New goal. Goal on track: YES.) Evaluation of  current treatment plan related to Depression, Mental Health Concerns  self-management and patient's adherence to plan as established by provider. Discussed plans with patient for ongoing care management follow up and provided patient with direct contact  information for care management team Evaluation of current treatment plan related to depression  and patient's adherence to plan as established by provider; Advised patient to call the office for changes in mood, anxiety, or increased episodes of depression; Provided education to patient re: working with CCM team to effectively manage her depression; Reviewed medications with patient and discussed compliance ; Reviewed scheduled/upcoming provider appointments including: 03-18-2021 at 1120 am Social Work referral for assistance with coping skills and recommendations for effective management of depression; Discussed plans with patient for ongoing care management follow up and provided patient with direct contact information for care management team; Screening for signs and symptoms of depression related to chronic disease state;  Assessed social determinant of health barriers;  Pain:  (Status: New goal. Goal on track: YES.) Pain assessment performed Medications reviewed Reviewed provider established plan for pain management; Discussed importance of adherence to all scheduled medical appointments; Counseled on the importance of reporting any/all new or changed pain symptoms or management strategies to pain management provider; Advised patient to report to care team affect of pain on daily activities; Discussed use of relaxation techniques and/or diversional activities to assist with pain reduction (distraction, imagery, relaxation, massage, acupressure, TENS, heat, and cold application; Reviewed with patient prescribed pharmacological and nonpharmacological pain relief strategies; Advised patient to discuss increased level or intensity of pain or uncontrolled pain  with provider;  Patient Goals/Self-Care Activities: Patient will self administer medications as prescribed Patient will attend all scheduled provider appointments Patient will call pharmacy for medication refills Patient will attend  church or other social activities Patient will continue to perform ADL's independently Patient will continue to perform IADL's independently Patient will call provider office for new concerns or questions  Follow Up Plan: Telephone follow up appointment with care management team member scheduled for: 01-27-2021 at 1 pm    Consent to CCM Services: Ms. Holquin was given information about Chronic Care Management services today including:  CCM service includes personalized support from designated clinical staff supervised by her physician, including individualized plan of care and coordination with other care providers 24/7 contact phone numbers for assistance for urgent and routine care needs. Service will only be billed when office clinical staff spend 20 minutes or more in a month to coordinate care. Only one practitioner may furnish and bill the service in a calendar month. The patient may stop CCM services at any time (effective at the end of the month) by phone call to the office staff. The patient will be responsible for cost sharing (co-pay) of up to 20% of the service fee (after annual deductible is met).  Patient agreed to services and verbal consent obtained.   The patient verbalized understanding of instructions, educational materials, and care plan provided today and declined offer to receive copy of patient instructions, educational materials, and care plan.   Telephone follow up appointment with care management team member scheduled for: 01-27-2021 at 1 pm  Noreene Larsson RN, MSN, [facility] Mobile: [phone]

## 2020-12-26 ENCOUNTER — Encounter: Payer: Medicare HMO | Admitting: Physician Assistant

## 2020-12-26 ENCOUNTER — Other Ambulatory Visit: Payer: Self-pay

## 2020-12-26 ENCOUNTER — Telehealth: Payer: Self-pay

## 2020-12-26 DIAGNOSIS — I480 Paroxysmal atrial fibrillation: Secondary | ICD-10-CM | POA: Diagnosis not present

## 2020-12-26 DIAGNOSIS — Z7901 Long term (current) use of anticoagulants: Secondary | ICD-10-CM | POA: Diagnosis not present

## 2020-12-26 DIAGNOSIS — L03311 Cellulitis of abdominal wall: Secondary | ICD-10-CM | POA: Diagnosis not present

## 2020-12-26 DIAGNOSIS — I10 Essential (primary) hypertension: Secondary | ICD-10-CM | POA: Diagnosis not present

## 2020-12-26 DIAGNOSIS — L98492 Non-pressure chronic ulcer of skin of other sites with fat layer exposed: Secondary | ICD-10-CM | POA: Diagnosis not present

## 2020-12-26 NOTE — Progress Notes (Signed)
Patient has been scheduled

## 2020-12-26 NOTE — Chronic Care Management (AMB) (Signed)
  Chronic Care Management   Note  12/26/2020 Name: TAYA ASHBAUGH MRN: [id number] DOB: 15-Nov-1950  DRU LAUREL is a 70 y.o. year old female who is a primary care patient of Cannady, Barbaraann Faster, NP. LATERIA ALDERMAN is currently enrolled in care management services. An additional referral for LCSW was placed.   Follow up plan: Telephone appointment with care management team member scheduled for:01/01/2021  Noreene Larsson, [place], [place], [place] [address] Direct Dial: [phone] [email or web address]: [email or web address]

## 2020-12-26 NOTE — Progress Notes (Addendum)
Cindy [name] ([id number]) Visit Report for 12/26/2020 Chief Complaint Document Details Patient Name: Cindy [name], Cindy [name]. Date of Service: 12/26/2020 12:30 PM Medical Record Number: [id number] Patient Account Number: [id number] Date of Birth/Sex: 1950/08/19 (70 y.o. F) Treating RN: Carlene Coria Primary Care Provider: Marnee Guarneri Other Clinician: Referring Provider: Thereasa Distance Treating Provider/Extender: Skipper Cliche in Treatment: 4 Information Obtained from: Patient Chief Complaint Abdominal Ulcer Electronic Signature(s) Signed: 12/26/2020 12:48:05 PM By: Worthy Keeler PA-C Entered By: Worthy Keeler on 12/26/2020 12:48:05 [name], [name] ([id number]) -------------------------------------------------------------------------------- HPI Details Patient Name: Cindy [name]. Date of Service: 12/26/2020 12:30 PM Medical Record Number: [id number] Patient Account Number: [id number] Date of Birth/Sex: Sep 17, 1950 (70 y.o. F) Treating RN: Carlene Coria Primary Care Provider: Marnee Guarneri Other Clinician: Referring Provider: Thereasa Distance Treating Provider/Extender: Skipper Cliche in Treatment: 4 History of Present Illness HPI Description: 11/27/2020 upon evaluation today patient presents today for initial inspection here in our clinic concerning issues that she has been having with a wound over the left abdominal wall. Subsequently this is actually something that to be honest appears to have potentially been a infection type issue. Subsequently the patient states that she has been having spots pop up all over and none of them have been quite as large as what this is currently. Nonetheless she tells me that she has been try to manage these as much as possible on her own she has been using Hibiclens to wash them with but again not washing head to toe necessarily. Fortunately there does not appear to be any signs of overall systemic infection and even locally I do not see  anything obvious at the moment. She is on doxycycline and has been using triamcinolone as given to her by Dr. Phillip Heal who is a local dermatologist. This wound in particular has been present for about 2 months although she has been having the issue for quite some time. Patient does have a history of hypertension, atrial fibrillation, and long-term use of anticoagulant therapy, 12/04/2020 upon evaluation today patient appears to be doing well with regard to her wound. She has been tolerating the dressing changes without complication. Fortunately there is no signs of active infection which is great news and overall very pleased with where things stand today. I think that the use of the mupirocin as well as the Hibiclens washes has been helpful as well she has had no new areas pop up. 12/12/2020 upon evaluation today patient appears to be doing much better in regard to her wound on the abdominal area laterally. She has been tolerating the dressing changes without complication. Fortunately there does not appear to be any evidence of infection at this time which is great news and overall extremely pleased with where things stand today. The patient likewise is happy that this is doing much better. 12/19/2020 upon evaluation today patient appears to be doing decently well today in regard to her wound this is measuring smaller and overall very pleased with how the abdominal ulcer is progressing. There does not appear to be any signs of infection which is great news. No fevers, chills, nausea, vomiting, or diarrhea. 12/26/2020 upon evaluation today patient appears to be doing well with regard to her wound in fact this appears to be completely healed which is great news. Fortunately there is no signs of active infection at this time. No fevers, chills, nausea, vomiting, or diarrhea. Overall I am extremely pleased with where things stand and how she is progressing at this time.  Electronic Signature(s) Signed:  12/26/2020 12:52:03 PM By: Worthy Keeler PA-C Entered By: Worthy Keeler on 12/26/2020 12:52:02 Cindy [name], Cindy [name] Kitchen ([id number]) -------------------------------------------------------------------------------- Physical Exam Details Patient Name: Cindy [name]. Date of Service: 12/26/2020 12:30 PM Medical Record Number: [id number] Patient Account Number: [id number] Date of Birth/Sex: 07-08-1950 (70 y.o. F) Treating RN: Carlene Coria Primary Care Provider: Marnee Guarneri Other Clinician: Referring Provider: Thereasa Distance Treating Provider/Extender: Skipper Cliche in Treatment: 4 Constitutional Well-nourished and well-hydrated in no acute distress. Respiratory normal breathing without difficulty. Psychiatric this patient is able to make decisions and demonstrates good insight into disease process. Alert and Oriented x 3. pleasant and cooperative. Notes Patient's wound again showed signs of complete epithelization which is great news and overall I am extremely pleased with where we stand today there does not appear to be any evidence of infection which is great news. Electronic Signature(s) Signed: 12/26/2020 12:52:18 PM By: Worthy Keeler PA-C Entered By: Worthy Keeler on 12/26/2020 12:52:17 Cindy [name], [name] ([id number]) -------------------------------------------------------------------------------- Physician Orders Details Patient Name: Cindy [name]. Date of Service: 12/26/2020 12:30 PM Medical Record Number: [id number] Patient Account Number: [id number] Date of Birth/Sex: 1950-08-26 (70 y.o. F) Treating RN: Carlene Coria Primary Care Provider: Marnee Guarneri Other Clinician: Referring Provider: Thereasa Distance Treating Provider/Extender: Skipper Cliche in Treatment: 4 Verbal / Phone Orders: No Diagnosis Coding ICD-10 Coding Code Description Y09.983 Cellulitis of abdominal wall L98.492 Non-pressure chronic ulcer of skin of other sites with fat layer exposed I10  Essential (primary) hypertension I48.0 Paroxysmal atrial fibrillation Z79.01 Long term (current) use of anticoagulants Discharge From [facility] Services o Discharge from [place] Treatment Complete - apply lotion every evening, patient to wear cover dressing in the daytime times 1 to 2 weeks Wound Treatment Electronic Signature(s) Signed: 12/26/2020 4:21:57 PM By: Worthy Keeler PA-C Signed: 12/26/2020 4:32:50 PM By: Carlene Coria RN Entered By: Carlene Coria on 12/26/2020 12:50:10 Cindy [name], [name] ([id number]) -------------------------------------------------------------------------------- Problem List Details Patient Name: Cindy [name]. Date of Service: 12/26/2020 12:30 PM Medical Record Number: [id number] Patient Account Number: [id number] Date of Birth/Sex: November 07, 1950 (70 y.o. F) Treating RN: Carlene Coria Primary Care Provider: Marnee Guarneri Other Clinician: Referring Provider: Thereasa Distance Treating Provider/Extender: Skipper Cliche in Treatment: 4 Active Problems ICD-10 Encounter Code Description Active Date MDM Diagnosis L03.311 Cellulitis of abdominal wall 11/27/2020 No Yes L98.492 Non-pressure chronic ulcer of skin of other sites with fat layer exposed 11/27/2020 No Yes I10 Essential (primary) hypertension 11/27/2020 No Yes I48.0 Paroxysmal atrial fibrillation 11/27/2020 No Yes Z79.01 Long term (current) use of anticoagulants 11/27/2020 No Yes Inactive Problems Resolved Problems Electronic Signature(s) Signed: 12/26/2020 12:47:56 PM By: Worthy Keeler PA-C Entered By: Worthy Keeler on 12/26/2020 12:47:56 Cindy [name], [name] ([id number]) -------------------------------------------------------------------------------- Progress Note Details Patient Name: Cindy [name]. Date of Service: 12/26/2020 12:30 PM Medical Record Number: [id number] Patient Account Number: [id number] Date of Birth/Sex: 14-Aug-1950 (70 y.o. F) Treating RN: Carlene Coria Primary Care  Provider: Marnee Guarneri Other Clinician: Referring Provider: Thereasa Distance Treating Provider/Extender: Skipper Cliche in Treatment: 4 Subjective Chief Complaint Information obtained from Patient Abdominal Ulcer History of Present Illness (HPI) 11/27/2020 upon evaluation today patient presents today for initial inspection here in our clinic concerning issues that she has been having with a wound over the left abdominal wall. Subsequently this is actually something that to be honest appears to have potentially been a infection type issue. Subsequently the patient states that she has been having spots pop up all  over and none of them have been quite as large as what this is currently. Nonetheless she tells me that she has been try to manage these as much as possible on her own she has been using Hibiclens to wash them with but again not washing head to toe necessarily. Fortunately there does not appear to be any signs of overall systemic infection and even locally I do not see anything obvious at the moment. She is on doxycycline and has been using triamcinolone as given to her by Dr. Phillip Heal who is a local dermatologist. This wound in particular has been present for about 2 months although she has been having the issue for quite some time. Patient does have a history of hypertension, atrial fibrillation, and long-term use of anticoagulant therapy, 12/04/2020 upon evaluation today patient appears to be doing well with regard to her wound. She has been tolerating the dressing changes without complication. Fortunately there is no signs of active infection which is great news and overall very pleased with where things stand today. I think that the use of the mupirocin as well as the Hibiclens washes has been helpful as well she has had no new areas pop up. 12/12/2020 upon evaluation today patient appears to be doing much better in regard to her wound on the abdominal area laterally. She has  been tolerating the dressing changes without complication. Fortunately there does not appear to be any evidence of infection at this time which is great news and overall extremely pleased with where things stand today. The patient likewise is happy that this is doing much better. 12/19/2020 upon evaluation today patient appears to be doing decently well today in regard to her wound this is measuring smaller and overall very pleased with how the abdominal ulcer is progressing. There does not appear to be any signs of infection which is great news. No fevers, chills, nausea, vomiting, or diarrhea. 12/26/2020 upon evaluation today patient appears to be doing well with regard to her wound in fact this appears to be completely healed which is great news. Fortunately there is no signs of active infection at this time. No fevers, chills, nausea, vomiting, or diarrhea. Overall I am extremely pleased with where things stand and how she is progressing at this time. Objective Constitutional Well-nourished and well-hydrated in no acute distress. Vitals Time Taken: 12:40 PM, Height: 60 in, Weight: 242 lbs, BMI: 47.3, Temperature: 98.6 F, Pulse: 60 bpm, Respiratory Rate: 18 breaths/min, Blood Pressure: 130/71 mmHg. Respiratory normal breathing without difficulty. Psychiatric this patient is able to make decisions and demonstrates good insight into disease process. Alert and Oriented x 3. pleasant and cooperative. General Notes: Patient's wound again showed signs of complete epithelization which is great news and overall I am extremely pleased with where we stand today there does not appear to be any evidence of infection which is great news. Integumentary (Hair, Skin) Wound #1 status is Open. Original cause of wound was Gradually Appeared. The date acquired was: 08/29/2020. The wound has been in treatment 4 weeks. The wound is located on the Left Abdomen - Lower Quadrant. The wound measures 0cm length x 0cm  width x 0cm depth; 0cm^2 area and 0cm^3 volume. There is no tunneling or undermining noted. There is a medium amount of serosanguineous drainage noted. There is no Roads, Janisse J. ([id number]) granulation within the wound bed. There is no necrotic tissue within the wound bed. Assessment Active Problems ICD-10 Cellulitis of abdominal wall Non-pressure chronic ulcer of skin of  other sites with fat layer exposed Essential (primary) hypertension Paroxysmal atrial fibrillation Long term (current) use of anticoagulants Plan Discharge From [facility] Services: Discharge from [place] Treatment Complete - apply lotion every evening, patient to wear cover dressing in the daytime times 1 to 2 weeks 1. Would recommend that she continue to cover this for about a week or 2 just with a protective dressing no Xeroform needed just to ensure nothing rubs or causes anything to reopen here. The patient is in agreement with the plan. 2. I am also can recommend at this time that we have the patient go ahead and continue to take this off at bedtime and apply some lotion to the healed area this will help with the integrity of the skin she is in agreement with that plan. We will see the patient back for follow-up visit as needed if anything changes or worsens. Electronic Signature(s) Signed: 12/26/2020 12:53:05 PM By: Worthy Keeler PA-C Entered By: Worthy Keeler on 12/26/2020 12:53:05 Cindy [name], [name] ([id number]) -------------------------------------------------------------------------------- SuperBill Details Patient Name: Cindy [name]. Date of Service: 12/26/2020 Medical Record Number: [id number] Patient Account Number: [id number] Date of Birth/Sex: 05-Dec-1950 (69 y.o. F) Treating RN: Carlene Coria Primary Care Provider: Marnee Guarneri Other Clinician: Referring Provider: Thereasa Distance Treating Provider/Extender: Skipper Cliche in Treatment: 4 Diagnosis Coding ICD-10 Codes Code  Description J62.836 Cellulitis of abdominal wall L98.492 Non-pressure chronic ulcer of skin of other sites with fat layer exposed I10 Essential (primary) hypertension I48.0 Paroxysmal atrial fibrillation Z79.01 Long term (current) use of anticoagulants Facility Procedures CPT4 Code: [id number] Description: [phone] - WOUND CARE VISIT-LEV 2 EST PT Modifier: Quantity: 1 Physician Procedures CPT4 Code: [id number] Description: 75170 - WC PHYS LEVEL 3 - EST PT Modifier: Quantity: 1 CPT4 Code: Description: ICD-10 Diagnosis Description Y17.494 Cellulitis of abdominal wall L98.492 Non-pressure chronic ulcer of skin of other sites with fat layer expos I10 Essential (primary) hypertension I48.0 Paroxysmal atrial fibrillation Modifier: ed Quantity: Electronic Signature(s) Signed: 12/26/2020 12:54:29 PM By: Worthy Keeler PA-C Entered By: Worthy Keeler on 12/26/2020 12:54:28

## 2020-12-27 NOTE — Progress Notes (Signed)
CHENOAH, [name] ([id number]) Visit Report for 12/26/2020 Arrival Information Details Patient Name: Cindy [name], Cindy [name]. Date of Service: 12/26/2020 12:30 PM Medical Record Number: [id number] Patient Account Number: [id number] Date of Birth/Sex: 1950-12-10 (70 y.o. F) Treating RN: Cindy [name] Primary Care Cindy [name]: Cindy [name] Other Clinician: Referring Cindy [name]: Cindy [name] Treating Cindy [name]/Extender: Cindy [name] in Treatment: 4 Visit Information History Since Last Visit All ordered tests and consults were completed: No Patient Arrived: Wheel Chair Added or deleted any medications: No Arrival Time: 12:36 Any new allergies or adverse reactions: No Accompanied By: self Had a fall or experienced change in No Transfer Assistance: None activities of daily living that may affect Patient Identification Verified: Yes risk of falls: Secondary Verification Process Completed: Yes Signs or symptoms of abuse/neglect since last visito No Patient Requires Transmission-Based Precautions: No Hospitalized since last visit: No Patient Has Alerts: No Implantable device outside of the clinic excluding No cellular tissue based products placed in the center since last visit: Has Dressing in Place as Prescribed: Yes Pain Present Now: No Electronic Signature(s) Signed: 12/26/2020 4:32:50 PM By: Cindy Coria RN Entered By: Cindy [name] on 12/26/2020 12:40:18 Steinhardt, [name] ([id number]) -------------------------------------------------------------------------------- Clinic Level of Care Assessment Details Patient Name: Cindy [name]. Date of Service: 12/26/2020 12:30 PM Medical Record Number: [id number] Patient Account Number: [id number] Date of Birth/Sex: September 14, 1950 (70 y.o. F) Treating RN: Cindy [name] Primary Care Cindy [name]: Cindy [name] Other Clinician: Referring Cindy [name]: Cindy [name] Treating Cindy [name]/Extender: Cindy [name] in Treatment: 4 Clinic Level of Care  Assessment Items TOOL 4 Quantity Score X - Use when only an EandM is performed on FOLLOW-UP visit 1 0 ASSESSMENTS - Nursing Assessment / Reassessment []  - Reassessment of Co-morbidities (includes updates in patient status) 0 []  - 0 Reassessment of Adherence to Treatment Plan ASSESSMENTS - Wound and Skin Assessment / Reassessment X - Simple Wound Assessment / Reassessment - one wound 1 5 []  - 0 Complex Wound Assessment / Reassessment - multiple wounds []  - 0 Dermatologic / Skin Assessment (not related to wound area) ASSESSMENTS - Focused Assessment []  - Circumferential Edema Measurements - multi extremities 0 []  - 0 Nutritional Assessment / Counseling / Intervention []  - 0 Lower Extremity Assessment (monofilament, tuning fork, pulses) []  - 0 Peripheral Arterial Disease Assessment (using hand held doppler) ASSESSMENTS - Ostomy and/or Continence Assessment and Care []  - Incontinence Assessment and Management 0 []  - 0 Ostomy Care Assessment and Management (repouching, etc.) PROCESS - Coordination of Care X - Simple Patient / Family Education for ongoing care 1 15 []  - 0 Complex (extensive) Patient / Family Education for ongoing care []  - 0 Staff obtains Programmer, systems, Records, Test Results / Process Orders []  - 0 Staff telephones HHA, Nursing Homes / Clarify orders / etc []  - 0 Routine Transfer to another Facility (non-emergent condition) []  - 0 Routine Hospital Admission (non-emergent condition) []  - 0 New Admissions / Biomedical engineer / Ordering NPWT, Apligraf, etc. []  - 0 Emergency Hospital Admission (emergent condition) X- 1 10 Simple Discharge Coordination []  - 0 Complex (extensive) Discharge Coordination PROCESS - Special Needs []  - Pediatric / Minor Patient Management 0 []  - 0 Isolation Patient Management []  - 0 Hearing / Language / Visual special needs []  - 0 Assessment of Community assistance (transportation, D/C planning, etc.) []  - 0 Additional  assistance / Altered mentation []  - 0 Support Surface(s) Assessment (bed, cushion, seat, etc.) INTERVENTIONS - Wound Cleansing / Measurement Tienda, Breah J. ([id number]) X- 1 5 Simple Wound Cleansing -  one wound []  - 0 Complex Wound Cleansing - multiple wounds X- 1 5 Wound Imaging (photographs - any number of wounds) []  - 0 Wound Tracing (instead of photographs) X- 1 5 Simple Wound Measurement - one wound []  - 0 Complex Wound Measurement - multiple wounds INTERVENTIONS - Wound Dressings X - Small Wound Dressing one or multiple wounds 1 10 []  - 0 Medium Wound Dressing one or multiple wounds []  - 0 Large Wound Dressing one or multiple wounds []  - 0 Application of Medications - topical []  - 0 Application of Medications - injection INTERVENTIONS - Miscellaneous []  - External ear exam 0 []  - 0 Specimen Collection (cultures, biopsies, blood, body fluids, etc.) []  - 0 Specimen(s) / Culture(s) sent or taken to Lab for analysis []  - 0 Patient Transfer (multiple staff / Civil Service fast streamer / Similar devices) []  - 0 Simple Staple / Suture removal (25 or less) []  - 0 Complex Staple / Suture removal (26 or more) []  - 0 Hypo / Hyperglycemic Management (close monitor of Blood Glucose) []  - 0 Ankle / Brachial Index (ABI) - do not check if billed separately X- 1 5 Vital Signs Has the patient been seen at the hospital within the last three years: Yes Total Score: 60 Level Of Care: New/Established - Level 2 Electronic Signature(s) Signed: 12/26/2020 4:32:50 PM By: Cindy Coria RN Entered By: Cindy [name] on 12/26/2020 12:50:41 Micallef, [name] ([id number]) -------------------------------------------------------------------------------- Encounter Discharge Information Details Patient Name: Cindy [name]. Date of Service: 12/26/2020 12:30 PM Medical Record Number: [id number] Patient Account Number: [id number] Date of Birth/Sex: [birth year] (70 y.o. F) Treating RN: Cindy [name] Primary  Care Andrej [name]: Cindy [name] Other Clinician: Referring Rooney Gladwin: Cindy [name] Treating Chia Rock/Extender: Cindy [name] in Treatment: 4 Encounter Discharge Information Items Discharge Condition: Stable Ambulatory Status: Wheelchair Discharge Destination: Home Transportation: Private Auto Accompanied By: self Schedule Follow-up Appointment: Yes Clinical Summary of Care: Patient Declined Electronic Signature(s) Signed: 12/26/2020 4:32:50 PM By: Cindy Coria RN Entered By: Cindy [name] on 12/26/2020 12:52:25 Goldin, Davisha J. ([id number]) -------------------------------------------------------------------------------- Lower Extremity Assessment Details Patient Name: Cindy [name]. Date of Service: 12/26/2020 12:30 PM Medical Record Number: [id number] Patient Account Number: [id number] Date of Birth/Sex: 1950/06/29 (70 y.o. F) Treating RN: Cindy [name] Primary Care Breelyn Icard: Cindy [name] Other Clinician: Referring Reana Chacko: Cindy [name] Treating Fatuma Dowers/Extender: Cindy [name] in Treatment: 4 Electronic Signature(s) Signed: 12/26/2020 4:32:50 PM By: Cindy Coria RN Entered By: Cindy [name] on 12/26/2020 12:45:33 Preiss, Danyetta J. ([id number]) -------------------------------------------------------------------------------- Multi Wound Chart Details Patient Name: Cindy [name]. Date of Service: 12/26/2020 12:30 PM Medical Record Number: [id number] Patient Account Number: [id number] Date of Birth/Sex: 12/10/1950 (70 y.o. F) Treating RN: Cindy [name] Primary Care Ayo Guarino: Cindy [name] Other Clinician: Referring Kamalei Roeder: Cindy [name] Treating Araina Butrick/Extender: Cindy [name] in Treatment: 4 Vital Signs Height(in): 60 Pulse(bpm): 60 Weight(lbs): 242 Blood Pressure(mmHg): 130/71 Body Mass Index(BMI): 47 Temperature(F): 98.6 Respiratory Rate(breaths/min): 18 Photos: [N/A:N/A] Wound Location: Left Abdomen - Lower Quadrant N/A N/A Wounding  Event: Gradually Appeared N/A N/A Primary Etiology: Inflammatory N/A N/A Comorbid History: Hypertension, Type II Diabetes N/A N/A Date Acquired: 08/29/2020 N/A N/A Weeks of Treatment: 4 N/A N/A Wound Status: Open N/A N/A Measurements L x W x D (cm) 0x0x0 N/A N/A Area (cm) : 0 N/A N/A Volume (cm) : 0 N/A N/A % Reduction in Area: 100.00% N/A N/A % Reduction in Volume: 100.00% N/A N/A Classification: Full Thickness Without Exposed N/A N/A Support Structures Exudate Amount: Medium N/A N/A Exudate Type: Serosanguineous N/A N/A  Exudate Color: red, brown N/A N/A Granulation Amount: None Present (0%) N/A N/A Necrotic Amount: None Present (0%) N/A N/A Exposed Structures: Fascia: No N/A N/A Fat Layer (Subcutaneous Tissue): No Tendon: No Muscle: No Joint: No Bone: No Epithelialization: Small (1-33%) N/A N/A Treatment Notes Electronic Signature(s) Signed: 12/26/2020 4:32:50 PM By: Cindy Coria RN Entered By: Cindy [name] on 12/26/2020 Algona, Stickney. ([id number]) -------------------------------------------------------------------------------- Warner Details Patient Name: Cindy [name]. Date of Service: 12/26/2020 12:30 PM Medical Record Number: [id number] Patient Account Number: [id number] Date of Birth/Sex: 04-02-1951 (70 y.o. F) Treating RN: Cindy [name] Primary Care Jailynne Opperman: Cindy [name] Other Clinician: Referring Shauniece Kwan: Cindy [name] Treating Netasha Wehrli/Extender: Cindy [name] in Treatment: 4 Active Inactive Electronic Signature(s) Signed: 12/26/2020 4:32:50 PM By: Cindy Coria RN Entered By: Cindy [name] on 12/26/2020 12:49:07 Cadenhead, Delona J. ([id number]) -------------------------------------------------------------------------------- Pain Assessment Details Patient Name: Cindy [name]. Date of Service: 12/26/2020 12:30 PM Medical Record Number: [id number] Patient Account Number: [id number] Date of Birth/Sex: 02/13/1951 (70 y.o.  F) Treating RN: Cindy [name] Primary Care Niccolas Loeper: Cindy [name] Other Clinician: Referring Haitham Dolinsky: Cindy [name] Treating Deann Mclaine/Extender: Cindy [name] in Treatment: 4 Active Problems Location of Pain Severity and Description of Pain Patient Has Paino No Site Locations Pain Management and Medication Current Pain Management: Electronic Signature(s) Signed: 12/26/2020 4:32:50 PM By: Cindy Coria RN Entered By: Cindy [name] on 12/26/2020 12:41:09 Fennewald, Rishita J. ([id number]) -------------------------------------------------------------------------------- Patient/Caregiver Education Details Patient Name: Cindy [name]. Date of Service: 12/26/2020 12:30 PM Medical Record Number: [id number] Patient Account Number: [id number] Date of Birth/Gender: 09-09-1950 (70 y.o. F) Treating RN: Cindy [name] Primary Care Physician: Cindy [name] Other Clinician: Referring Physician: Thereasa [name] Treating Physician/Extender: Cindy [name] in Treatment: 4 Education Assessment Education Provided To: Patient Education Topics Provided Wound/Skin Impairment: Methods: Explain/Verbal Responses: State content correctly Electronic Signature(s) Signed: 12/26/2020 4:32:50 PM By: Cindy Coria RN Entered By: Cindy [name] on 12/26/2020 12:50:57 Maxton, Yassmine J. ([id number]) -------------------------------------------------------------------------------- Wound Assessment Details Patient Name: Cindy [name]. Date of Service: 12/26/2020 12:30 PM Medical Record Number: [id number] Patient Account Number: [id number] Date of Birth/Sex: 10-Aug-1950 (70 y.o. F) Treating RN: Cindy [name] Primary Care Hinata Diener: Cindy [name] Other Clinician: Referring Shirley Bolle: Cindy [name] Treating Locke Barrell/Extender: Cindy [name] in Treatment: 4 Wound Status Wound Number: 1 Primary Etiology: Inflammatory Wound Location: Left Abdomen - Lower Quadrant Wound Status: Open Wounding Event:  Gradually Appeared Comorbid History: Hypertension, Type II Diabetes Date Acquired: 08/29/2020 Weeks Of Treatment: 4 Clustered Wound: No Photos Wound Measurements Length: (cm) 0 Width: (cm) 0 Depth: (cm) 0 Area: (cm) Volume: (cm) % Reduction in Area: 100% % Reduction in Volume: 100% Epithelialization: Small (1-33%) 0 Tunneling: No 0 Undermining: No Wound Description Classification: Full Thickness Without Exposed Support Structu Exudate Amount: Medium Exudate Type: Serosanguineous Exudate Color: red, brown res Foul Odor After Cleansing: No Slough/Fibrino No Wound Bed Granulation Amount: None Present (0%) Exposed Structure Necrotic Amount: None Present (0%) Fascia Exposed: No Fat Layer (Subcutaneous Tissue) Exposed: No Tendon Exposed: No Muscle Exposed: No Joint Exposed: No Bone Exposed: No Electronic Signature(s) Signed: 12/26/2020 4:32:50 PM By: Cindy Coria RN Entered By: Cindy [name] on 12/26/2020 12:45:04 Futch, Jaszmine J. ([id number]) -------------------------------------------------------------------------------- Vitals Details Patient Name: Cindy [name]. Date of Service: 12/26/2020 12:30 PM Medical Record Number: [id number] Patient Account Number: [id number] Date of Birth/Sex: August 03, 1950 (70 y.o. F) Treating RN: Cindy [name] Primary Care Angelea Penny: Cindy [name] Other Clinician: Referring Scotlynn Noyes: Cindy [name] Treating Shelma Eiben/Extender: Cindy [name] in Treatment: 4 Vital Signs Time Taken: 12:40  Temperature (F): 98.6 Height (in): 60 Pulse (bpm): 60 Weight (lbs): 242 Respiratory Rate (breaths/min): 18 Body Mass Index (BMI): 47.3 Blood Pressure (mmHg): 130/71 Reference Range: 80 - 120 mg / dl Electronic Signature(s) Signed: 12/26/2020 4:32:50 PM By: Cindy Coria RN Entered By: Cindy [name] on 12/26/2020 12:40:38

## 2020-12-27 NOTE — Progress Notes (Signed)
BETTIE, CAPISTRAN ([id number]) Visit Report for 12/19/2020 Arrival Information Details Patient Name: Cindy [name], Cindy [name]. Date of Service: 12/19/2020 12:30 PM Medical Record Number: [id number] Patient Account Number: [id number] Date of Birth/Sex: 04-26-1951 (70 y.o. F) Treating RN: Carlene Coria Primary Care Sitlali Koerner: Marnee Guarneri Other Clinician: Referring Yashar Inclan: Thereasa Distance Treating Amaury Kuzel/Extender: Skipper Cliche in Treatment: 3 Visit Information History Since Last Visit All ordered tests and consults were completed: No Patient Arrived: Wheel Chair Added or deleted any medications: No Arrival Time: 12:40 Any new allergies or adverse reactions: No Accompanied By: daughter Had a fall or experienced change in No Transfer Assistance: None activities of daily living that may affect Patient Identification Verified: Yes risk of falls: Secondary Verification Process Completed: Yes Signs or symptoms of abuse/neglect since last visito No Patient Requires Transmission-Based Precautions: No Hospitalized since last visit: No Patient Has Alerts: No Implantable device outside of the clinic excluding No cellular tissue based products placed in the center since last visit: Has Dressing in Place as Prescribed: Yes Pain Present Now: No Electronic Signature(s) Signed: 12/26/2020 4:33:49 PM By: Carlene Coria RN Entered By: Carlene Coria on 12/19/2020 12:42:36 Grajeda, Klynn J. ([id number]) -------------------------------------------------------------------------------- Clinic Level of Care Assessment Details Patient Name: Cindy [name]. Date of Service: 12/19/2020 12:30 PM Medical Record Number: [id number] Patient Account Number: [id number] Date of Birth/Sex: 1951/04/17 (70 y.o. F) Treating RN: Carlene Coria Primary Care Stetson Pelaez: Marnee Guarneri Other Clinician: Referring Hatsue Sime: Thereasa Distance Treating Valetta Mulroy/Extender: Skipper Cliche in Treatment: 3 Clinic Level of Care  Assessment Items TOOL 4 Quantity Score X - Use when only an EandM is performed on FOLLOW-UP visit 1 0 ASSESSMENTS - Nursing Assessment / Reassessment X - Reassessment of Co-morbidities (includes updates in patient status) 1 10 X- 1 5 Reassessment of Adherence to Treatment Plan ASSESSMENTS - Wound and Skin Assessment / Reassessment X - Simple Wound Assessment / Reassessment - one wound 1 5 []  - 0 Complex Wound Assessment / Reassessment - multiple wounds []  - 0 Dermatologic / Skin Assessment (not related to wound area) ASSESSMENTS - Focused Assessment []  - Circumferential Edema Measurements - multi extremities 0 []  - 0 Nutritional Assessment / Counseling / Intervention []  - 0 Lower Extremity Assessment (monofilament, tuning fork, pulses) []  - 0 Peripheral Arterial Disease Assessment (using hand held doppler) ASSESSMENTS - Ostomy and/or Continence Assessment and Care []  - Incontinence Assessment and Management 0 []  - 0 Ostomy Care Assessment and Management (repouching, etc.) PROCESS - Coordination of Care X - Simple Patient / Family Education for ongoing care 1 15 []  - 0 Complex (extensive) Patient / Family Education for ongoing care []  - 0 Staff obtains Programmer, systems, Records, Test Results / Process Orders []  - 0 Staff telephones HHA, Nursing Homes / Clarify orders / etc []  - 0 Routine Transfer to another Facility (non-emergent condition) []  - 0 Routine Hospital Admission (non-emergent condition) []  - 0 New Admissions / Biomedical engineer / Ordering NPWT, Apligraf, etc. []  - 0 Emergency Hospital Admission (emergent condition) X- 1 10 Simple Discharge Coordination []  - 0 Complex (extensive) Discharge Coordination PROCESS - Special Needs []  - Pediatric / Minor Patient Management 0 []  - 0 Isolation Patient Management []  - 0 Hearing / Language / Visual special needs []  - 0 Assessment of Community assistance (transportation, D/C planning, etc.) []  - 0 Additional  assistance / Altered mentation []  - 0 Support Surface(s) Assessment (bed, cushion, seat, etc.) INTERVENTIONS - Wound Cleansing / Measurement Fairley, Marsela J. ([id number]) X- 1 5 Simple Wound Cleansing -  one wound []  - 0 Complex Wound Cleansing - multiple wounds X- 1 5 Wound Imaging (photographs - any number of wounds) []  - 0 Wound Tracing (instead of photographs) X- 1 5 Simple Wound Measurement - one wound []  - 0 Complex Wound Measurement - multiple wounds INTERVENTIONS - Wound Dressings X - Small Wound Dressing one or multiple wounds 1 10 []  - 0 Medium Wound Dressing one or multiple wounds []  - 0 Large Wound Dressing one or multiple wounds []  - 0 Application of Medications - topical []  - 0 Application of Medications - injection INTERVENTIONS - Miscellaneous []  - External ear exam 0 []  - 0 Specimen Collection (cultures, biopsies, blood, body fluids, etc.) []  - 0 Specimen(s) / Culture(s) sent or taken to Lab for analysis []  - 0 Patient Transfer (multiple staff / Civil Service fast streamer / Similar devices) []  - 0 Simple Staple / Suture removal (25 or less) []  - 0 Complex Staple / Suture removal (26 or more) []  - 0 Hypo / Hyperglycemic Management (close monitor of Blood Glucose) []  - 0 Ankle / Brachial Index (ABI) - do not check if billed separately X- 1 5 Vital Signs Has the patient been seen at the hospital within the last three years: Yes Total Score: 75 Level Of Care: New/Established - Level 2 Electronic Signature(s) Signed: 12/26/2020 4:33:49 PM By: Carlene Coria RN Entered By: Carlene Coria on 12/19/2020 12:54:31 Luoma, Nadeen J. ([id number]) -------------------------------------------------------------------------------- Encounter Discharge Information Details Patient Name: Cindy [name]. Date of Service: 12/19/2020 12:30 PM Medical Record Number: [id number] Patient Account Number: [id number] Date of Birth/Sex: November 03, 1950 (70 y.o. F) Treating RN: Carlene Coria Primary  Care Lazar Tierce: Marnee Guarneri Other Clinician: Referring Nathifa Ritthaler: Thereasa Distance Treating Nicholai Willette/Extender: Skipper Cliche in Treatment: 3 Encounter Discharge Information Items Discharge Condition: Stable Ambulatory Status: Wheelchair Discharge Destination: Home Transportation: Private Auto Accompanied By: self Schedule Follow-up Appointment: Yes Clinical Summary of Care: Patient Declined Electronic Signature(s) Signed: 12/26/2020 4:33:49 PM By: Carlene Coria RN Entered By: Carlene Coria on 12/19/2020 12:59:55 Brindle, Cherylin J. ([id number]) -------------------------------------------------------------------------------- Lower Extremity Assessment Details Patient Name: Cindy [name]. Date of Service: 12/19/2020 12:30 PM Medical Record Number: [id number] Patient Account Number: [id number] Date of Birth/Sex: [birth year] (70 y.o. F) Treating RN: Carlene Coria Primary Care Oceana Walthall: Marnee Guarneri Other Clinician: Referring Bentlee Benningfield: Thereasa Distance Treating Adrinne Sze/Extender: Skipper Cliche in Treatment: 3 Electronic Signature(s) Signed: 12/26/2020 4:33:49 PM By: Carlene Coria RN Entered By: Carlene Coria on 12/19/2020 12:45:48 Budai, Terence J. ([id number]) -------------------------------------------------------------------------------- Multi Wound Chart Details Patient Name: Cindy [name]. Date of Service: 12/19/2020 12:30 PM Medical Record Number: [id number] Patient Account Number: [id number] Date of Birth/Sex: May 16, 1951 (70 y.o. F) Treating RN: Carlene Coria Primary Care Rivaan Kendall: Marnee Guarneri Other Clinician: Referring Jeffery Gammell: Thereasa Distance Treating Pablo Mathurin/Extender: Skipper Cliche in Treatment: 3 Vital Signs Height(in): 60 Pulse(bpm): 61 Weight(lbs): 242 Blood Pressure(mmHg): 124/53 Body Mass Index(BMI): 47 Temperature(F): 98.7 Respiratory Rate(breaths/min): 18 Photos: [N/A:N/A] Wound Location: Left Abdomen - Lower Quadrant N/A N/A Wounding  Event: Gradually Appeared N/A N/A Primary Etiology: Inflammatory N/A N/A Comorbid History: Hypertension, Type II Diabetes N/A N/A Date Acquired: 08/29/2020 N/A N/A Weeks of Treatment: 3 N/A N/A Wound Status: Open N/A N/A Measurements L x W x D (cm) 0.7x1.3x0.1 N/A N/A Area (cm) : 0.715 N/A N/A Volume (cm) : 0.071 N/A N/A % Reduction in Area: 88.20% N/A N/A % Reduction in Volume: 88.30% N/A N/A Classification: Full Thickness Without Exposed N/A N/A Support Structures Exudate Amount: Medium N/A N/A Exudate Type: Serosanguineous N/A N/A  Exudate Color: red, brown N/A N/A Granulation Amount: Large (67-100%) N/A N/A Granulation Quality: Red N/A N/A Necrotic Amount: None Present (0%) N/A N/A Exposed Structures: Fat Layer (Subcutaneous Tissue): N/A N/A Yes Fascia: No Tendon: No Muscle: No Joint: No Bone: No Epithelialization: Small (1-33%) N/A N/A Treatment Notes Electronic Signature(s) Signed: 12/26/2020 4:33:49 PM By: Carlene Coria RN Entered By: Carlene Coria on 12/19/2020 12:53:36 Lesiak, Ramani J. ([id number]) -------------------------------------------------------------------------------- Multi-Disciplinary Care Plan Details Patient Name: Cindy [name]. Date of Service: 12/19/2020 12:30 PM Medical Record Number: [id number] Patient Account Number: [id number] Date of Birth/Sex: 1950-06-13 (70 y.o. F) Treating RN: Carlene Coria Primary Care Ronit Cranfield: Marnee Guarneri Other Clinician: Referring Kayler Buckholtz: Thereasa Distance Treating Adelaida Reindel/Extender: Skipper Cliche in Treatment: 3 Active Inactive Wound/Skin Impairment Nursing Diagnoses: Impaired tissue integrity Goals: Patient/caregiver will verbalize understanding of skin care regimen Date Initiated: 11/27/2020 Target Resolution Date: 12/27/2020 Goal Status: Active Ulcer/skin breakdown will have a volume reduction of 30% by week 4 Date Initiated: 11/27/2020 Target Resolution Date: 12/27/2020 Goal Status: Active Ulcer/skin  breakdown will have a volume reduction of 50% by week 8 Date Initiated: 11/27/2020 Target Resolution Date: 01/27/2021 Goal Status: Active Ulcer/skin breakdown will have a volume reduction of 80% by week 12 Date Initiated: 11/27/2020 Target Resolution Date: 02/27/2021 Goal Status: Active Ulcer/skin breakdown will heal within 14 weeks Date Initiated: 11/27/2020 Target Resolution Date: 03/29/2021 Goal Status: Active Interventions: Assess patient/caregiver ability to obtain necessary supplies Assess patient/caregiver ability to perform ulcer/skin care regimen upon admission and as needed Assess ulceration(s) every visit Provide education on ulcer and skin care Treatment Activities: Referred to DME [place] for dressing supplies : 11/27/2020 Skin care regimen initiated : 11/27/2020 Notes: Electronic Signature(s) Signed: 12/26/2020 4:33:49 PM By: Carlene Coria RN Entered By: Carlene Coria on 12/19/2020 12:53:23 Solana, Zarra J. ([id number]) -------------------------------------------------------------------------------- Pain Assessment Details Patient Name: Cindy [name]. Date of Service: 12/19/2020 12:30 PM Medical Record Number: [id number] Patient Account Number: [id number] Date of Birth/Sex: 10/14/1950 (70 y.o. F) Treating RN: Carlene Coria Primary Care Arnulfo Batson: Marnee Guarneri Other Clinician: Referring Clatie Kessen: Thereasa Distance Treating Zael Shuman/Extender: Skipper Cliche in Treatment: 3 Active Problems Location of Pain Severity and Description of Pain Patient Has Paino No Site Locations Pain Management and Medication Current Pain Management: Electronic Signature(s) Signed: 12/26/2020 4:33:49 PM By: Carlene Coria RN Entered By: Carlene Coria on 12/19/2020 12:42:56 Navarette, Lynnie J. ([id number]) -------------------------------------------------------------------------------- Patient/Caregiver Education Details Patient Name: Cindy [name]. Date of Service: 12/19/2020 12:30  PM Medical Record Number: [id number] Patient Account Number: [id number] Date of Birth/Gender: [birth year] (70 y.o. F) Treating RN: Carlene Coria Primary Care Physician: Marnee Guarneri Other Clinician: Referring Physician: Thereasa Distance Treating Physician/Extender: Skipper Cliche in Treatment: 3 Education Assessment Education Provided To: Patient Education Topics Provided Wound/Skin Impairment: Methods: Explain/Verbal Responses: State content correctly Electronic Signature(s) Signed: 12/26/2020 4:33:49 PM By: Carlene Coria RN Entered By: Carlene Coria on 12/19/2020 12:59:21 Fraticelli, Letzy J. ([id number]) -------------------------------------------------------------------------------- Wound Assessment Details Patient Name: Cindy [name]. Date of Service: 12/19/2020 12:30 PM Medical Record Number: [id number] Patient Account Number: [id number] Date of Birth/Sex: [birth year] (70 y.o. F) Treating RN: Carlene Coria Primary Care Daymien Goth: Marnee Guarneri Other Clinician: Referring Dinisha Cai: Thereasa Distance Treating Lyana Asbill/Extender: Skipper Cliche in Treatment: 3 Wound Status Wound Number: 1 Primary Etiology: Inflammatory Wound Location: Left Abdomen - Lower Quadrant Wound Status: Open Wounding Event: Gradually Appeared Comorbid History: Hypertension, Type II Diabetes Date Acquired: 08/29/2020 Weeks Of Treatment: 3 Clustered Wound: No Photos Wound Measurements Length: (cm) 0.7 Width: (cm) 1.3 Depth: (cm) 0.1  Area: (cm) 0.715 Volume: (cm) 0.071 % Reduction in Area: 88.2% % Reduction in Volume: 88.3% Epithelialization: Small (1-33%) Tunneling: No Undermining: No Wound Description Classification: Full Thickness Without Exposed Support Structu Exudate Amount: Medium Exudate Type: Serosanguineous Exudate Color: red, brown res Foul Odor After Cleansing: No Slough/Fibrino No Wound Bed Granulation Amount: Large (67-100%) Exposed Structure Granulation Quality:  Red Fascia Exposed: No Necrotic Amount: None Present (0%) Fat Layer (Subcutaneous Tissue) Exposed: Yes Tendon Exposed: No Muscle Exposed: No Joint Exposed: No Bone Exposed: No Electronic Signature(s) Signed: 12/26/2020 4:33:49 PM By: Carlene Coria RN Entered By: Carlene Coria on 12/19/2020 12:45:12 Padula, Shamiyah J. ([id number]) -------------------------------------------------------------------------------- Vitals Details Patient Name: Cindy [name]. Date of Service: 12/19/2020 12:30 PM Medical Record Number: [id number] Patient Account Number: [id number] Date of Birth/Sex: 07-Nov-1950 (70 y.o. F) Treating RN: Carlene Coria Primary Care Chyrl Elwell: Marnee Guarneri Other Clinician: Referring Jera Headings: Thereasa Distance Treating Kadajah Kjos/Extender: Skipper Cliche in Treatment: 3 Vital Signs Time Taken: 12:42 Temperature (F): 98.7 Height (in): 60 Pulse (bpm): 61 Weight (lbs): 242 Respiratory Rate (breaths/min): 18 Body Mass Index (BMI): 47.3 Blood Pressure (mmHg): 124/53 Reference Range: 80 - 120 mg / dl Electronic Signature(s) Signed: 12/26/2020 4:33:49 PM By: Carlene Coria RN Entered By: Carlene Coria on 12/19/2020 12:42:51

## 2021-01-01 ENCOUNTER — Telehealth: Payer: Self-pay | Admitting: Licensed Clinical Social Worker

## 2021-01-01 ENCOUNTER — Telehealth: Payer: Medicare HMO

## 2021-01-01 NOTE — Telephone Encounter (Signed)
    Clinical Social Work  Chronic Care Management   Phone Outreach    01/01/2021 Name: Cindy [name] MRN: [id number] DOB: 04-28-1951  Cindy [name] is a 70 y.o. year old female who is a primary care patient of Cannady, Barbaraann Faster, NP .   CCM LCSW reached out to patient today by phone to introduce self, assess needs and offer Care Management services and interventions.    Telephone outreach was unsuccessful A HIPPA compliant phone message was left for the patient providing contact information and requesting a return call.   CCM LCSW collaborated with CCM RN, Noreene Larsson, regarding coordination of care.  Plan:CCM LCSW will wait for return call. If no return call is received, Will route chart to Care Guide to see if patient would like to reschedule phone appointment   Review of patient status, including review of consultants reports, relevant laboratory and other test results, and collaboration with appropriate care team members and the patient's provider was performed as part of comprehensive patient evaluation and provision of care management services.    Christa See, MSW, [email or web address] Phone [phone] 3:44 PM

## 2021-01-02 ENCOUNTER — Encounter: Payer: Medicare HMO | Admitting: Physician Assistant

## 2021-01-06 ENCOUNTER — Telehealth: Payer: Self-pay | Admitting: *Deleted

## 2021-01-06 NOTE — Telephone Encounter (Signed)
Forms was brought in for [facility] to complete. Placed in [place] for review Thanks

## 2021-01-07 NOTE — Telephone Encounter (Signed)
Faxed. Completed paperwork is place in Minor folder up front beside fax machine.

## 2021-01-09 ENCOUNTER — Telehealth: Payer: Self-pay | Admitting: Pharmacy Technician

## 2021-01-09 DIAGNOSIS — Z596 Low income: Secondary | ICD-10-CM

## 2021-01-09 NOTE — Progress Notes (Signed)
[place] [facility])                                            [place] Team    01/09/2021  ZABRIA LISS 1951-04-15 [id number]  Received both patient and provider portion(s) of patient assistance application(s) for Eliquis and Jardiance. Faxed completed application and required documents into BMS and BI respectively.   Seara Hinesley P. Naylea Wigington, [place]  [phone]

## 2021-01-13 ENCOUNTER — Other Ambulatory Visit: Payer: Self-pay

## 2021-01-13 ENCOUNTER — Ambulatory Visit: Payer: Medicare HMO | Admitting: Internal Medicine

## 2021-01-13 ENCOUNTER — Encounter: Payer: Self-pay | Admitting: Internal Medicine

## 2021-01-13 DIAGNOSIS — I152 Hypertension secondary to endocrine disorders: Secondary | ICD-10-CM | POA: Diagnosis not present

## 2021-01-13 DIAGNOSIS — R0609 Other forms of dyspnea: Secondary | ICD-10-CM | POA: Insufficient documentation

## 2021-01-13 DIAGNOSIS — R0600 Dyspnea, unspecified: Secondary | ICD-10-CM | POA: Diagnosis not present

## 2021-01-13 DIAGNOSIS — E1159 Type 2 diabetes mellitus with other circulatory complications: Secondary | ICD-10-CM

## 2021-01-13 MED ORDER — OLMESARTAN MEDOXOMIL 40 MG PO TABS: 40.0000 mg | ORAL_TABLET | Freq: Every day | ORAL | 2 refills | Status: DC

## 2021-01-13 NOTE — Assessment & Plan Note (Signed)
Onset around 2016 worse since covid 19 Jul 2020  - try off acei 01/13/2021   Symptoms are markedly disproportionate to objective findings and not clear to what extent this is actually a pulmonary  problem but pt does appear to have difficult to sort out respiratory symptoms of unknown origin for which  DDX  = almost all start with A and  include Adherence, Ace Inhibitors, Acid Reflux, Active Sinus Disease, Alpha 1 Antitripsin deficiency, Anxiety masquerading as Airways dz,  ABPA,  Allergy(esp in young), Aspiration (esp in elderly), Adverse effects of meds,  Active smoking or Vaping, A bunch of PE's/clot burden (a few small clots can't cause this syndrome unless there is already severe underlying pulm or vascular dz with poor reserve),  Anemia or thyroid disorder, plus two Bs  = Bronchiectasis and Beta blocker use..and one C= CHF   ACEi adverse effects at the  top of the usual list of suspects and the only way to rule it out is a trial off > see hbp    Acid (or non-acid) GERD > always difficult to exclude as up to 75% of pts in some series report no assoc GI/ Heartburn symptoms> rec continue  PPI   ? 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  ? BB effects > In the setting of respiratory symptoms of unknown etiology,  It would be preferable to use bystolic, the most beta -1  selective Beta blocker available in sample form, with bisoprolol the most selective generic choice  on the market, at least on a trial basis, to make sure the spillover Beta 2 effects of the less specific Beta blockers are not contributing to this patient's symptoms. >>> change next to bisoprolol 10 mg bid if not better off acei    ? CHF > last bnp nl, no cm on cxr despite longstanding hbp though she could still have diastolic dysfunction   >> re-eval in 6 weeks   prn

## 2021-01-13 NOTE — Assessment & Plan Note (Signed)
Body mass index is 44.71 kg/m.    Lab Results  Component Value Date   TSH 1.400 12/03/2020      Contributing to doe and risk of worse GERD inducing more cough/ "wheeze"  >>>   reviewed the need and the process to achieve and maintain neg calorie balance > defer f/u primary care including intermittently monitoring thyroid status            Each maintenance medication was reviewed in detail including emphasizing most importantly the difference between maintenance and prns and under what circumstances the prns are to be triggered using an action plan format where appropriate.  Total time for H and P, chart review, counseling, reviewing hfa  device(s) and generating customized AVS unique to this office visit / same day charting  > 60 min

## 2021-01-13 NOTE — Assessment & Plan Note (Signed)
Changed ACEi to ARB 01/13/2021 due to new onset "wheeze/cough"   In the best review of chronic cough to date ( NEJM 2016 375 [id number]) ,  ACEi are now felt to cause cough in up to  20% of pts which is a 4 fold increase from previous reports and does not include the variety of non-specific complaints we see in pulmonary clinic in pts on ACEi but previously attributed to another dx like  Copd/asthma and  include PNDS, throat and chest congestion, "bronchitis", unexplained dyspnea and noct "strangling" sensations, and hoarseness, but also  atypical /refractory GERD symptoms like dysphagia and "bad heartburn"   The only way I know  to prove this is not an "ACEi Case" is a trial off ACEi x a minimum of 6 weeks then regroup.   >>> try d/c lisinopril and substitute olmesartan 40 mg daily in its place and return if not back to baseline p 6 weeks

## 2021-01-13 NOTE — Progress Notes (Signed)
Bland Span, female    DOB: 1950/10/25    MRN: [id number]   Brief patient profile:  77 yobf never smoker  referred to pulmonary clinic 01/13/2021 by  Marnee Guarneri NP for post covid resp cc cough/ sob/wheezing with no prior respiratory complaints other than doe    Admit date: 07/15/2020 Discharge date: 07/18/2020   Admitted From: Home Disposition: Home   Recommendations for Outpatient Follow-up:  Follow up with PCP in 1-2 weeks Please obtain BMP/CBC in one week your next doctors visit.  Oral Decadron has been prescribed for 6 more days Iron supplements with bowel regimen prescribed As needed albuterol inhaler prescribed     Discharge Condition: Stable CODE STATUS: Full code Diet recommendation: Diabetic   Brief/Interim Summary: 70 year old with past medical history of A. fib, chronic pain, depression, diabetes, GERD, HTN, HLD, iron deficiency anemia, OSA, diabetic nephropathy comes to the hospital with complaints of URI symptoms and fever.  Patient was diagnosed with COVID-19.  Started on steroids and remdesivir.  She was also noted to be in AKI with creatinine of 2.1, baseline 1.0.   Stable today for discharge. Her daughter updated as well on the day of discharge.      Assessment & Plan:   Principal Problem:   Pneumonia due to COVID-19 virus   Acute respiratory failure with hypoxia (HCC)   AKI (acute kidney injury) ([place])   HTN (hypertension)   A-fib (HCC)   Chronic pain syndrome   Depression   Iron deficiency   Diabetes (HCC)   GERD (gastroesophageal reflux disease)   HLD (hyperlipidemia)   OSA (obstructive sleep apnea)   Acute respiratory distress     Acute hypoxic distress secondary to COVID-19 pneumonia;  Improving -Patient has been weaned down to room air.  She is not vaccinated against COVID-19 -Chest x-ray-bilateral opacities  -Procalcitonin- Neg -BNP-60.9 -Decadron-we will discharge her on 6 more days -Remdesivir-day 3/3 -Advised to take home her  incentive spirometer and flutter valve -Albuterol inhaler prescribed   Acute kidney injury, slowly improving -Elevated creatinine 2.1.    Baseline creatinine 1.2.  Improved with IV fluids.  Today is 1.3.  Advised to get repeat lab work when she follows with her PCP in 1-2 weeks   Hyperkalemia -Resolved   History of paroxysmal atrial fibrillation -Continue Cardizem, metoprolol and Eliquis   History of essential hypertension -Resume her home medications.   Iron deficiency anemia -Hemoglobin around baseline of 9.5. -Iron studies showing low iron saturation.  Continue iron supplements and bowel regimen   Diabetes mellitus type 2 with low blood glucose -Continue Accu-Cheks and sliding scale. Levemir increased to 12U BID   GERD -PPI   Hyperlipidemia -Statin   Chronic pain with depression -Continue tramadol and duloxetine   Patient worked with physical therapy, she did well.  Did not have any home needs.  Her oxygen saturations remained stable with ambulation on room air         History of Present Illness  01/13/2021  Pulmonary/ 1st office eval/ Melvyn Novas / [place] s/p Covid feb 2022  Chief Complaint  Patient presents with   Consult    Sob, coughing, post nasal drip. Wheezing.    Dyspnea:  slowed more by back than breathing, last shopped at least 5 y prior to OV   Cough: worse when head hits pillow / sinuses real bad even before the virus/ pnds  Sleep: cpap / flat bed about 30 degrees with pillows SABA use: helps some/ last used  over a week prior to [place]:  x 2   No obvious day to day or daytime variability or assoc excess/ purulent sputum or mucus plugs or hemoptysis or cp or chest tightness, subjective wheeze or overt  hb symptoms.   Sleeping as above  without nocturnal  or early am exacerbation  of respiratory  c/o's or need for noct saba. Also denies any obvious fluctuation of symptoms with weather or environmental changes or other aggravating or  alleviating factors except as outlined above   No unusual exposure hx or h/o childhood pna/ asthma or knowledge of premature birth.  Current Allergies, Complete Past Medical History, Past Surgical History, Family History, and Social History were reviewed in [employer] record.  ROS  The following are not active complaints unless bolded Hoarseness, sore throat, dysphagia, dental problems, itching, sneezing,  nasal congestion or discharge of excess mucus or purulent secretions, ear ache,   fever, chills, sweats, unintended wt loss or wt gain, classically pleuritic or exertional cp,  orthopnea pnd or arm/hand swelling  or leg swelling, presyncope, palpitations, abdominal pain, anorexia, nausea, vomiting, diarrhea  or change in bowel habits or change in bladder habits, change in stools or change in urine, dysuria, hematuria,  rash, arthralgias, visual complaints, headache, numbness, weakness or ataxia or problems with walking or coordination,  change in mood or  memory.            Past Medical History:  Diagnosis Date   Chronic pain syndrome    Depression    Diabetes mellitus without complication (HCC)    GERD (gastroesophageal reflux disease)    History of hiatal hernia    Hypertension    Iron deficiency anemia    Microalbuminuric diabetic nephropathy (HCC)    Mitral regurgitation    Obesity    Shortness of breath dyspnea    Sleep apnea     Outpatient Medications Prior to Visit  Medication Sig Dispense Refill   acetaminophen (TYLENOL) 650 MG CR tablet Take 650 mg by mouth every 8 (eight) hours as needed for pain.     albuterol (VENTOLIN HFA) 108 (90 Base) MCG/ACT inhaler Inhale 1-2 puffs into the lungs every 6 (six) hours as needed for wheezing or shortness of breath. 1 each 1   apixaban (ELIQUIS) 5 MG TABS tablet Take 1 tablet (5 mg total) by mouth 2 (two) times daily. 60 tablet 0   atorvastatin (LIPITOR) 40 MG tablet Take 40 mg by mouth daily.     cetirizine  (ZYRTEC) 10 MG tablet Take 10 mg by mouth daily.     diltiazem (CARDIZEM CD) 360 MG 24 hr capsule Take 360 mg by mouth daily.     DULoxetine (CYMBALTA) 60 MG capsule Take 60 mg by mouth daily.     ferrous sulfate 325 (65 FE) MG tablet Take 1 tablet (325 mg total) by mouth 2 (two) times daily with a meal. 60 tablet 1   fluticasone (FLONASE) 50 MCG/ACT nasal spray Place 2 sprays into both nostrils daily. 16 g 7   furosemide (LASIX) 80 MG tablet Take 80 mg by mouth 2 (two) times daily.     metFORMIN (GLUCOPHAGE) 1000 MG tablet Take 500 mg by mouth 2 (two) times daily with a meal.     metoprolol succinate (TOPROL-XL) 100 MG 24 hr tablet Take 50-100 mg by mouth 2 (two) times daily. Take 1 tablet in the morning and one-half tablet in the evening. Take with or immediately following  a meal.     minoxidil (LONITEN) 10 MG tablet Take 10 mg by mouth daily.     montelukast (SINGULAIR) 10 MG tablet Take 10 mg by mouth daily as needed (allergies).   99   Multiple Vitamin (MULTIVITAMIN WITH MINERALS) TABS tablet Take 1 tablet by mouth daily.     omeprazole (PRILOSEC) 20 MG capsule Take 20 mg by mouth daily as needed (heartburn).      polyethylene glycol powder (GLYCOLAX/MIRALAX) powder Take 17 g by mouth daily.      Semaglutide,0.25 or 0.5MG /DOS, (OZEMPIC, 0.25 OR 0.5 MG/DOSE,) 2 MG/1.5ML SOPN Start with 0.25MG  once a week x 4 weeks, then increase to 0.5MG  weekly. 4.5 mL 3   spironolactone (ALDACTONE) 25 MG tablet Take 25 mg by mouth daily.     lisinopril (PRINIVIL,ZESTRIL) 40 MG tablet Take 40 mg by mouth daily.     No facility-administered medications prior to visit.     Objective:     BP 120/72 (BP Location: Left Arm, Patient Position: Sitting, Cuff Size: Normal)   Pulse 75   Temp 98.5 F (36.9 C) (Oral)   Ht 5\' 1"  (1.549 m)   Wt 236 lb 9.6 oz (107.3 kg)   SpO2 94%   BMI 44.71 kg/m   SpO2: 94 % RA  Pleasant slt hoarse amb bf / walks with 2 wheeled walker   HEENT : pt wearing mask not  removed for exam due to covid -19 concerns.    NECK :  without JVD/Nodes/TM/ nl carotid upstrokes bilaterally   LUNGS: no acc muscle use,  Nl contour chest which is clear to A and P bilaterally without cough on insp or exp maneuvers   CV:  RRR  no s3 or murmur or increase in P2, and no edema   ABD:  quite obese but soft and nontender with nl inspiratory excursion in the supine position. No bruits or organomegaly appreciated, bowel sounds nl  MS:  Nl gait/ ext warm without deformities, calf tenderness, cyanosis or clubbing No obvious joint restrictions   SKIN: warm and dry without lesions    NEURO:  alert, approp, nl sensorium with  no motor or cerebellar deficits apparent.      I personally reviewed images and agree with radiology impression as follows:  CXR:   11/19/20  No acute cardiopulmonary process. Assessment   DOE (dyspnea on exertion) Onset around 2016 worse since covid 19 Jul 2020  - try off acei 01/13/2021   Symptoms are markedly disproportionate to objective findings and not clear to what extent this is actually a pulmonary  problem but pt does appear to have difficult to sort out respiratory symptoms of unknown origin for which  DDX  = almost all start with A and  include Adherence, Ace Inhibitors, Acid Reflux, Active Sinus Disease, Alpha 1 Antitripsin deficiency, Anxiety masquerading as Airways dz,  ABPA,  Allergy(esp in young), Aspiration (esp in elderly), Adverse effects of meds,  Active smoking or Vaping, A bunch of PE's/clot burden (a few small clots can't cause this syndrome unless there is already severe underlying pulm or vascular dz with poor reserve),  Anemia or thyroid disorder, plus two Bs  = Bronchiectasis and Beta blocker use..and one C= CHF   ACEi adverse effects at the  top of the usual list of suspects and the only way to rule it out is a trial off > see hbp    Acid (or non-acid) GERD > always difficult to exclude as up to 75%  of pts in some series report  no assoc GI/ Heartburn symptoms> rec continue  PPI   ? 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  ? BB effects > In the setting of respiratory symptoms of unknown etiology,  It would be preferable to use bystolic, the most beta -1  selective Beta blocker available in sample form, with bisoprolol the most selective generic choice  on the market, at least on a trial basis, to make sure the spillover Beta 2 effects of the less specific Beta blockers are not contributing to this patient's symptoms. >>> change next to bisoprolol 10 mg bid if not better off acei    ? CHF > last bnp nl, no cm on cxr despite longstanding hbp though she could still have diastolic dysfunction   >> re-eval in 6 weeks   prn      Hypertension associated with diabetes ([place]) Changed ACEi to ARB 01/13/2021 due to new onset "wheeze/cough"   In the best review of chronic cough to date ( NEJM 2016 375 4098-1191) ,  ACEi are now felt to cause cough in up to  20% of pts which is a 4 fold increase from previous reports and does not include the variety of non-specific complaints we see in pulmonary clinic in pts on ACEi but previously attributed to another dx like  Copd/asthma and  include PNDS, throat and chest congestion, "bronchitis", unexplained dyspnea and noct "strangling" sensations, and hoarseness, but also  atypical /refractory GERD symptoms like dysphagia and "bad heartburn"   The only way I know  to prove this is not an "ACEi Case" is a trial off ACEi x a minimum of 6 weeks then regroup.   >>> try d/c lisinopril and substitute olmesartan 40 mg daily in its place and return if not back to baseline p 6 weeks     Morbid obesity ([place]) Body mass index is 44.71 kg/m.    Lab Results  Component Value Date   TSH 1.400 12/03/2020      Contributing to doe and risk of worse GERD inducing more cough/ "wheeze"  >>>   reviewed the need and the process to achieve and  maintain neg calorie balance > defer f/u primary care including intermittently monitoring thyroid status            Each maintenance medication was reviewed in detail including emphasizing most importantly the difference between maintenance and prns and under what circumstances the prns are to be triggered using an action plan format where appropriate.  Total time for H and P, chart review, counseling, reviewing hfa  device(s) and generating customized AVS unique to this office visit / same day charting  > 60 min            Christinia Gully, MD 01/13/2021

## 2021-01-13 NOTE — Patient Instructions (Signed)
Try stopping the lisinopril and starting in its place olmesartan 40  mg one tablet daily   If not doing better after a few weeks I would consider changing the metaprolol to bisoprolol 10 mg twice daily    If you are satisfied with your treatment plan,  let your doctor know and he/she can either refill your medications or you can return here when your prescription runs out.     If in any way you are not 100% satisfied,  please tell us.  If 100% better, tell your friends!  Pulmonary follow up is as needed

## 2021-01-14 ENCOUNTER — Telehealth: Payer: Self-pay | Admitting: Pharmacy Technician

## 2021-01-14 DIAGNOSIS — E1159 Type 2 diabetes mellitus with other circulatory complications: Secondary | ICD-10-CM | POA: Diagnosis not present

## 2021-01-14 DIAGNOSIS — E7800 Pure hypercholesterolemia, unspecified: Secondary | ICD-10-CM | POA: Diagnosis not present

## 2021-01-14 DIAGNOSIS — Z596 Low income: Secondary | ICD-10-CM

## 2021-01-14 DIAGNOSIS — I152 Hypertension secondary to endocrine disorders: Secondary | ICD-10-CM | POA: Diagnosis not present

## 2021-01-14 DIAGNOSIS — I517 Cardiomegaly: Secondary | ICD-10-CM | POA: Diagnosis not present

## 2021-01-14 DIAGNOSIS — I480 Paroxysmal atrial fibrillation: Secondary | ICD-10-CM | POA: Diagnosis not present

## 2021-01-14 NOTE — Progress Notes (Signed)
[facility])                                            [place] Team    01/14/2021  BABBIE DONDLINGER [birth year] [phone]  2 care coordination calls placed, first  to BMS in regards to Eliquis application and then to BI in regards to [place] application.  Spoke to O'Neill at [employer]  (Eliquis) who informed patient was APPROVED 01/09/21-05/30/21. She informed a 90 days supply of medication would be delivered to the patient's home.  Spoke to Goodman at [facility]) who informed patient was APPROVED 01/13/21-05/30/21. She informed a 90 days supply of medication would be delivered to the patient's home.  Nylen Creque P. Harleen Fineberg, [place]  [phone]

## 2021-01-16 ENCOUNTER — Ambulatory Visit (INDEPENDENT_AMBULATORY_CARE_PROVIDER_SITE_OTHER): Payer: Medicare HMO | Admitting: Licensed Clinical Social Worker

## 2021-01-16 DIAGNOSIS — G894 Chronic pain syndrome: Secondary | ICD-10-CM

## 2021-01-16 DIAGNOSIS — E1169 Type 2 diabetes mellitus with other specified complication: Secondary | ICD-10-CM

## 2021-01-16 DIAGNOSIS — F321 Major depressive disorder, single episode, moderate: Secondary | ICD-10-CM

## 2021-01-16 DIAGNOSIS — E1159 Type 2 diabetes mellitus with other circulatory complications: Secondary | ICD-10-CM

## 2021-01-16 DIAGNOSIS — I152 Hypertension secondary to endocrine disorders: Secondary | ICD-10-CM

## 2021-01-16 NOTE — Patient Instructions (Signed)
Visit Information   Goals Addressed             This Visit's Progress    Manage My Emotions   On track    Timeframe:  Long-Range Goal Priority:  Medium Start Date:      01/16/21                       Expected End Date:   03/30/21                    Follow Up Date 03/12/21   Patient Goals/Self-Care Activities: Over the next 120 days Contact clinic with any questions or concerns Utilize healthy coping skills discussed and continue compliance with meds Attend scheduled appointments with providers        Patient verbalizes understanding of instructions provided today.   Telephone follow up appointment with care management team member scheduled for:03/12/21  Christa See, MSW, [email or web address] Phone [phone] 2:16 PM

## 2021-01-16 NOTE — Chronic Care Management (AMB) (Signed)
Chronic Care Management    Clinical Social Work Note  01/16/2021 Name: Cindy [name] MRN: [id number] DOB: January 10, 1951  Cindy [name] is a 70 y.o. year old female who is a primary care patient of Cannady, Barbaraann Faster, NP. The CCM team was consulted to assist the patient with chronic disease management and/or care coordination needs related to: Mental Health Counseling and Resources.   Engaged with patient by telephone for initial visit in response to provider referral for social work chronic care management and care coordination services.   Consent to Services:  The patient was given information about Chronic Care Management services, agreed to services, and gave verbal consent prior to initiation of services.  Please see initial visit note for detailed documentation.   Patient agreed to services and consent obtained.   Consent to Services:  The patient was given information about Care Management services, agreed to services, and gave verbal consent prior to initiation of services.  Please see initial visit note for detailed documentation.   Patient agreed to services today and consent obtained.   Assessment: Engaged with patient by telephone in response to provider referral for social work care coordination services: [place] [facility].    Patient provided all information during this encounter. She reports symptoms of depression triggered by chronic medical conditions. Healthy coping skills were discussed to assist with management. Patient receives strong support system. See Care Plan below for interventions and patient self-care actives.  Recent life changes or stressors: Management of health conditions  Recommendation: Patient may benefit from, and is in agreement work with LCSW to address care coordination needs and will continue to work with the clinical team to address health care and disease management related needs.   Follow up Plan: Patient would like continued  follow-up from CCM LCSW .  per patient's request will follow up in 03/12/21.  Will call office if needed prior to next encounter.  SDOH (Social Determinants of Health) assessments and interventions performed:  SDOH Interventions    Flowsheet Row Most Recent Value  SDOH Interventions   Food Insecurity Interventions Intervention Not Indicated  Housing Interventions Intervention Not Indicated  Transportation Interventions Intervention Not Indicated        Advanced Directives Status: Not addressed in this encounter.  CCM Care Plan  Allergies  Allergen Reactions   Bupropion Other (See Comments)   Gabapentin Palpitations   Pioglitazone Palpitations   Vicodin [Hydrocodone-Acetaminophen] Other (See Comments)    Reaction: racing heart   Vicoprofen [Hydrocodone-Ibuprofen] Other (See Comments)    Reaction: racing heart    Outpatient Encounter Medications as of 01/16/2021  Medication Sig   acetaminophen (TYLENOL) 650 MG CR tablet Take 650 mg by mouth every 8 (eight) hours as needed for pain.   albuterol (VENTOLIN HFA) 108 (90 Base) MCG/ACT inhaler Inhale 1-2 puffs into the lungs every 6 (six) hours as needed for wheezing or shortness of breath.   apixaban (ELIQUIS) 5 MG TABS tablet Take 1 tablet (5 mg total) by mouth 2 (two) times daily.   atorvastatin (LIPITOR) 40 MG tablet Take 40 mg by mouth daily.   cetirizine (ZYRTEC) 10 MG tablet Take 10 mg by mouth daily.   diltiazem (CARDIZEM CD) 360 MG 24 hr capsule Take 360 mg by mouth daily.   DULoxetine (CYMBALTA) 60 MG capsule Take 60 mg by mouth daily.   ferrous sulfate 325 (65 FE) MG tablet Take 1 tablet (325 mg total) by mouth 2 (two) times daily with a meal.  fluticasone (FLONASE) 50 MCG/ACT nasal spray Place 2 sprays into both nostrils daily.   furosemide (LASIX) 80 MG tablet Take 80 mg by mouth 2 (two) times daily.   metFORMIN (GLUCOPHAGE) 1000 MG tablet Take 500 mg by mouth 2 (two) times daily with a meal.   metoprolol succinate  (TOPROL-XL) 100 MG 24 hr tablet Take 50-100 mg by mouth 2 (two) times daily. Take 1 tablet in the morning and one-half tablet in the evening. Take with or immediately following a meal.   minoxidil (LONITEN) 10 MG tablet Take 10 mg by mouth daily.   montelukast (SINGULAIR) 10 MG tablet Take 10 mg by mouth daily as needed (allergies).    Multiple Vitamin (MULTIVITAMIN WITH MINERALS) TABS tablet Take 1 tablet by mouth daily.   olmesartan (BENICAR) 40 MG tablet Take 1 tablet (40 mg total) by mouth daily.   omeprazole (PRILOSEC) 20 MG capsule Take 20 mg by mouth daily as needed (heartburn).    polyethylene glycol powder (GLYCOLAX/MIRALAX) powder Take 17 g by mouth daily.    Semaglutide,0.25 or 0.5MG /DOS, (OZEMPIC, 0.25 OR 0.5 MG/DOSE,) 2 MG/1.5ML SOPN Start with 0.25MG  once a week x 4 weeks, then increase to 0.5MG  weekly.   spironolactone (ALDACTONE) 25 MG tablet Take 25 mg by mouth daily.   No facility-administered encounter medications on file as of 01/16/2021.    Patient Active Problem List   Diagnosis Date Noted   DOE (dyspnea on exertion) 01/13/2021   Post-COVID syndrome 12/03/2020   Recurrent boils 12/03/2020   Morbid obesity ([place]) 12/03/2020   CKD stage 4 due to type 2 diabetes mellitus ([place]) 12/01/2020   Aortic atherosclerosis ([place]) 12/01/2020   Hypertension associated with diabetes ([place]) 07/15/2020   A-fib ([place]) 07/15/2020   Chronic pain syndrome 07/15/2020   Depression, major, single episode, moderate ([place]) 07/15/2020   Type 2 diabetes mellitus with morbid obesity ([place]) 07/15/2020   GERD (gastroesophageal reflux disease) 07/15/2020   Hyperlipidemia associated with type 2 diabetes mellitus ([place]) 07/15/2020   OSA (obstructive sleep apnea) 07/15/2020   Other thrombophilia ([place]) 03/12/2020   History of lumbar spinal fusion 04/26/2018   Diuretic-induced hypokalemia 03/24/2018   Hypomagnesemia 03/24/2018   Hypertensive retinopathy of both eyes 06/08/2017   DDD (degenerative disc  disease), lumbar 10/31/2014   Cervical spondylosis with myelopathy 02/22/2014   Iron deficiency anemia 02/22/2014   Mitral regurgitation 09/08/2013    Conditions to be addressed/monitored: Depression; Mental Health Concerns   Care Plan : General Social Work (Adult)  Updates made by Rebekah Chesterfield, LCSW since 01/16/2021 12:00 AM     Problem: Coping Skills (General Plan of Care)      Goal: Coping Skills Enhanced   Start Date: 01/16/2021  This Visit's Progress: On track  Priority: High  Note:   Current barriers:   Acute Mental Health needs related to depression Mental Health Concerns  Needs Support, Education, and Care Coordination in order to meet unmet mental health needs. Clinical Goal(s): Over the next 120 days, patient will work with SW, counselor and therapist to reduce or manage symptoms of agitation, mood instability, stress, and bipolar until connected for ongoing counseling. Clinical Interventions:  1:1 collaboration with primary care provider regarding development and update of comprehensive plan of care as evidenced by provider attestation and co-signature Inter-disciplinary care team collaboration (see longitudinal plan of care) Assessed patient's previous and current treatment, coping skills, support system and barriers to care  Patient reports difficulty managing symptoms of depression triggered by chronic pain, chronic health conditions,  and limited mobility She endorses recent decrease in symptoms (low energy, feeling sad, and anxiety about pain) since "feeling better and becoming stronger" after multiple hospital visits the beginning of 2022 CCM LCSW provided validation and encouragement. Healthy coping skills were discussed to assist with management of symptoms. Patient was successful in identifying healthy coping skills that she can utilize daily. Enjoys driving to maintain independence Patient has a strong support system. Patient was excited about approval for  medicine to assist with financial strain   Patient walks in the house and participates in exercise to promote positive mood. She utilizes a walker and cane Patient reports compliance with medication management CCM LCSW reviewed upcoming appointments with patient CCM LCSW discussed strategies to reduce fall risk Mindfulness or Relaxation Training, Active listening / Reflection utilized , Emotional Supportive Provided, Provided psychoeducation for mental health needs , Reviewed mental health medications with patient and discussed compliance: , Verbalization of feelings encouraged , and Suicidal Ideation/Homicidal Ideation assessed: Patient denies SI/HI Patient Goals/Self-Care Activities: Over the next 120 days Contact clinic with any questions or concerns Utilize healthy coping skills discussed and continue compliance with meds Attend scheduled appointments with providers        Christa See, MSW, [email or web address] Phone [phone] 2:14 PM

## 2021-01-19 ENCOUNTER — Ambulatory Visit (INDEPENDENT_AMBULATORY_CARE_PROVIDER_SITE_OTHER): Payer: Medicare HMO

## 2021-01-19 VITALS — Ht 61.0 in | Wt 242.0 lb

## 2021-01-19 DIAGNOSIS — Z0000 Encounter for general adult medical examination without abnormal findings: Secondary | ICD-10-CM | POA: Diagnosis not present

## 2021-01-19 DIAGNOSIS — G4733 Obstructive sleep apnea (adult) (pediatric): Secondary | ICD-10-CM | POA: Diagnosis not present

## 2021-01-19 NOTE — Progress Notes (Signed)
I connected with Lamount Cohen today by telephone and verified that I am speaking with the correct person using two identifiers. Location patient: home Location provider: work Persons participating in the virtual visit: Rahel Carlton, Glenna Durand LPN.   I discussed the limitations, risks, security and privacy concerns of performing an evaluation and management service by telephone and the availability of in person appointments. I also discussed with the patient that there may be a patient responsible charge related to this service. The patient expressed understanding and verbally consented to this telephonic visit.    Interactive audio and video telecommunications were attempted between this provider and patient, however failed, due to patient having technical difficulties OR patient did not have access to video capability.  We continued and completed visit with audio only.     Vital signs may be patient reported or missing.  Subjective:   Cindy [name] is a 70 y.o. female who presents for an Initial Medicare Annual Wellness Visit.  Review of Systems     Cardiac Risk Factors include: advanced age (>63men, >67 women);diabetes mellitus;dyslipidemia;hypertension;obesity (BMI >30kg/m2);smoking/ tobacco exposure     Objective:    Today's Vitals   01/19/21 1353  Weight: 242 lb (109.8 kg)  Height: 5\' 1"  (1.549 m)   Body mass index is 45.73 kg/m.  Advanced Directives 01/19/2021 11/19/2020 11/19/2020 07/15/2020 04/18/2018 04/26/2017 03/19/2017  Does Patient Have a Medical Advance Directive? No No No No No No No  Would patient like information on creating a medical advance directive? - No - Patient declined - No - Patient declined No - Patient declined No - Patient declined No - Patient declined    Current Medications (verified) Outpatient Encounter Medications as of 01/19/2021  Medication Sig   acetaminophen (TYLENOL) 650 MG CR tablet Take 650 mg by mouth every 8 (eight) hours as  needed for pain.   albuterol (VENTOLIN HFA) 108 (90 Base) MCG/ACT inhaler Inhale 1-2 puffs into the lungs every 6 (six) hours as needed for wheezing or shortness of breath.   apixaban (ELIQUIS) 5 MG TABS tablet Take 1 tablet (5 mg total) by mouth 2 (two) times daily.   atorvastatin (LIPITOR) 40 MG tablet Take 40 mg by mouth daily.   cetirizine (ZYRTEC) 10 MG tablet Take 10 mg by mouth daily.   diltiazem (CARDIZEM CD) 360 MG 24 hr capsule Take 360 mg by mouth daily.   DULoxetine (CYMBALTA) 60 MG capsule Take 60 mg by mouth daily.   ferrous sulfate 325 (65 FE) MG tablet Take 1 tablet (325 mg total) by mouth 2 (two) times daily with a meal.   fluticasone (FLONASE) 50 MCG/ACT nasal spray Place 2 sprays into both nostrils daily.   furosemide (LASIX) 80 MG tablet Take 80 mg by mouth 2 (two) times daily.   metFORMIN (GLUCOPHAGE) 1000 MG tablet Take 500 mg by mouth 2 (two) times daily with a meal.   metoprolol succinate (TOPROL-XL) 100 MG 24 hr tablet Take 50-100 mg by mouth 2 (two) times daily. Take 1 tablet in the morning and one-half tablet in the evening. Take with or immediately following a meal.   minoxidil (LONITEN) 10 MG tablet Take 10 mg by mouth daily.   montelukast (SINGULAIR) 10 MG tablet Take 10 mg by mouth daily as needed (allergies).    Multiple Vitamin (MULTIVITAMIN WITH MINERALS) TABS tablet Take 1 tablet by mouth daily.   olmesartan (BENICAR) 40 MG tablet Take 1 tablet (40 mg total) by mouth daily.   omeprazole (PRILOSEC) 20  MG capsule Take 20 mg by mouth daily as needed (heartburn).    polyethylene glycol powder (GLYCOLAX/MIRALAX) powder Take 17 g by mouth daily.    Semaglutide,0.25 or 0.5MG /DOS, (OZEMPIC, 0.25 OR 0.5 MG/DOSE,) 2 MG/1.5ML SOPN Start with 0.25MG  once a week x 4 weeks, then increase to 0.5MG  weekly.   spironolactone (ALDACTONE) 25 MG tablet Take 25 mg by mouth daily.   No facility-administered encounter medications on file as of 01/19/2021.    Allergies  (verified) Bupropion, Gabapentin, Pioglitazone, Vicodin [hydrocodone-acetaminophen], and Vicoprofen [hydrocodone-ibuprofen]   History: Past Medical History:  Diagnosis Date   Chronic pain syndrome    Depression    Diabetes mellitus without complication (HCC)    GERD (gastroesophageal reflux disease)    History of hiatal hernia    Hypertension    Iron deficiency anemia    Microalbuminuric diabetic nephropathy (HCC)    Mitral regurgitation    Obesity    Shortness of breath dyspnea    Sleep apnea    Past Surgical History:  Procedure Laterality Date   ABDOMINAL HYSTERECTOMY     BACK SURGERY     BREAST BIOPSY Right 03/2003   CARDIAC CATHETERIZATION N/A 10/10/2014   Procedure: Left Heart Cath;  Surgeon: Isaias Cowman, MD;  Location: [place] [facility];  Service: Cardiovascular;  Laterality: N/A;   CARDIAC CATHETERIZATION     carpal tunnell release  10/23/11   COLONOSCOPY WITH PROPOFOL N/A 02/20/2015   Procedure: COLONOSCOPY WITH PROPOFOL;  Surgeon: Hulen Luster, MD;  Location: [facility] ENDOSCOPY;  Service: Gastroenterology;  Laterality: N/A;   COLONOSCOPY WITH PROPOFOL N/A 04/26/2017   Procedure: COLONOSCOPY WITH PROPOFOL;  Surgeon: Toledo, Benay Pike, MD;  Location: [facility] ENDOSCOPY;  Service: Gastroenterology;  Laterality: N/A;   ESOPHAGOGASTRODUODENOSCOPY (EGD) WITH PROPOFOL N/A 02/20/2015   Procedure: ESOPHAGOGASTRODUODENOSCOPY (EGD) WITH PROPOFOL;  Surgeon: Hulen Luster, MD;  Location: [facility] ENDOSCOPY;  Service: Gastroenterology;  Laterality: N/A;   ESOPHAGOGASTRODUODENOSCOPY (EGD) WITH PROPOFOL N/A 04/26/2017   Procedure: ESOPHAGOGASTRODUODENOSCOPY (EGD) WITH PROPOFOL;  Surgeon: Toledo, Benay Pike, MD;  Location: [facility] ENDOSCOPY;  Service: Gastroenterology;  Laterality: N/A;   HERNIA REPAIR     LAPAROSCOPIC OVARIAN CYSTECTOMY  08/2001   nissen fundoplycation  11/99   NOSE SURGERY     Family History  Problem Relation Age of Onset   Heart attack Mother    Hypertension Father    Stroke  Father    Heart attack Brother    Social History   Socioeconomic History   Marital status: Married    Spouse name: Not on file   Number of children: Not on file   Years of education: Not on file   Highest education level: Not on file  Occupational History   Not on file  Tobacco Use   Smoking status: Never   Smokeless tobacco: Current    Types: Snuff  Vaping Use   Vaping Use: Never used  Substance and Sexual Activity   Alcohol use: No   Drug use: No   Sexual activity: Not Currently  Other Topics Concern   Not on file  Social History Narrative   Not on file   Social Determinants of Health   Financial Resource Strain: Low Risk    Difficulty of Paying Living Expenses: Not hard at all  Food Insecurity: No Food Insecurity   Worried About Charity fundraiser in the Last Year: Never true   [place] in the Last Year: Never true  Transportation Needs: No Transportation Needs  Lack of Transportation (Medical): No   Lack of Transportation (Non-Medical): No  Physical Activity: Insufficiently Active   Days of Exercise per Week: 3 days   Minutes of Exercise per Session: 30 min  Stress: No Stress Concern Present   Feeling of Stress : Not at all  Social Connections: Moderately Integrated   Frequency of Communication with Friends and Family: More than three times a week   Frequency of Social Gatherings with Friends and Family: More than three times a week   Attends Religious Services: More than 4 times per year   Active Member of [employer] or Organizations: No   Attends Music therapist: Never   Marital Status: Married    Tobacco Counseling Ready to quit: Not Answered Counseling given: Not Answered   Clinical Intake:  Pre-visit preparation completed: Yes  Pain : No/denies pain     Nutritional Status: BMI > 30  Obese Nutritional Risks: None Diabetes: Yes  How often do you need to have someone help you when you read instructions, pamphlets, or other  written materials from your doctor or pharmacy?: 1 - Never What is the last grade level you completed in school?: 12th grade  Diabetic? Yes Nutrition Risk Assessment:  Has the patient had any N/V/D within the last 2 months?  No  Does the patient have any non-healing wounds?  No  Has the patient had any unintentional weight loss or weight gain?  No   Diabetes:  Is the patient diabetic?  Yes  If diabetic, was a CBG obtained today?  No  Did the patient bring in their glucometer from home?  No  How often do you monitor your CBG's? 3 times weekly.   Financial Strains and Diabetes Management:  Are you having any financial strains with the device, your supplies or your medication? No .  Does the patient want to be seen by Chronic Care Management for management of their diabetes?  No  Would the patient like to be referred to a Nutritionist or for Diabetic Management?  No   Diabetic Exams:  Diabetic Eye Exam: Overdue for diabetic eye exam. Pt has been advised about the importance in completing this exam. Patient advised to call and schedule an eye exam. Diabetic Foot Exam: Completed 12/17/2020   Interpreter Needed?: No  Information entered by :: NAllen LPN   Activities of Daily Living In your present state of health, do you have any difficulty performing the following activities: 01/19/2021 07/16/2020  Hearing? Y N  Comment trouble understanding sometimes -  Vision? N N  Difficulty concentrating or making decisions? Y N  Walking or climbing stairs? Y N  Dressing or bathing? N N  Doing errands, shopping? Y N  Preparing Food and eating ? N -  Using the Toilet? N -  In the past six months, have you accidently leaked urine? N -  Do you have problems with loss of bowel control? N -  Managing your Medications? N -  Managing your Finances? N -  Housekeeping or managing your Housekeeping? N -  Some recent data might be hidden    Patient Care Team: Venita Lick, NP as PCP -  General (Nurse Practitioner) Vanita Ingles, RN as Registered Nurse ([place]) Rebekah Chesterfield, LCSW as Social Worker (Licensed Clinical Social Worker)  Indicate any recent [place] you may have received from other than [facility] providers in the past year (date may be approximate).     Assessment:   This is  a routine wellness examination for [facility].  Hearing/Vision screen Vision Screening - Comments:: Regular eye exams, [facility]  Dietary issues and exercise activities discussed: Current Exercise Habits: Home exercise routine, Type of exercise: Other - see comments (pedaling), Time (Minutes): 30, Frequency (Times/Week): 3, Weekly Exercise (Minutes/Week): 90   Goals Addressed             This Visit's Progress    Patient Stated       01/19/2021, wants to weigh 200 pounds       Depression Screen PHQ 2/9 Scores 01/19/2021 12/03/2020 12/03/2020  PHQ - 2 Score 0 2 2  PHQ- 9 Score - 7 6    Fall Risk Fall Risk  01/19/2021  Falls in the past year? 0  Risk for fall due to : Medication side effect  Follow up Falls evaluation completed;Education provided;Falls prevention discussed    FALL RISK PREVENTION PERTAINING TO THE HOME:  Any stairs in or around the home? Yes  If so, are there any without handrails? Yes  Home free of loose throw rugs in walkways, pet beds, electrical cords, etc? Yes  Adequate lighting in your home to reduce risk of falls? Yes   ASSISTIVE DEVICES UTILIZED TO PREVENT FALLS:  Life alert? No  Use of a cane, walker or w/c? Yes  Grab bars in the bathroom? Yes  Shower chair or bench in shower? Yes  Elevated toilet seat or a handicapped toilet? No   TIMED UP AND GO:  Was the test performed? No .      Cognitive Function:     6CIT Screen 01/19/2021  What Year? 0 points  What month? 0 points  What time? 0 points  Count back from 20 0 points  Months in reverse 2 points  Repeat phrase 0 points  Total Score 2     Immunizations Immunization History  Administered Date(s) Administered   Moderna Sars-Covid-2 Vaccination 11/18/2020, 12/16/2020    TDAP status: Due, Education has been provided regarding the importance of this vaccine. Advised may receive this vaccine at local pharmacy or Health Dept. Aware to provide a copy of the vaccination record if obtained from local pharmacy or Health Dept. Verbalized acceptance and understanding.  Flu Vaccine status: Declined, Education has been provided regarding the importance of this vaccine but patient still declined. Advised may receive this vaccine at local pharmacy or Health Dept. Aware to provide a copy of the vaccination record if obtained from local pharmacy or Health Dept. Verbalized acceptance and understanding.  Pneumococcal vaccine status: Declined,  Education has been provided regarding the importance of this vaccine but patient still declined. Advised may receive this vaccine at local pharmacy or Health Dept. Aware to provide a copy of the vaccination record if obtained from local pharmacy or Health Dept. Verbalized acceptance and understanding.   Covid-19 vaccine status: Completed vaccines  Qualifies for Shingles Vaccine? Yes   Zostavax completed No   Shingrix Completed?: No.    Education has been provided regarding the importance of this vaccine. Patient has been advised to call insurance company to determine out of pocket expense if they have not yet received this vaccine. Advised may also receive vaccine at local pharmacy or Health Dept. Verbalized acceptance and understanding.  Screening Tests Health Maintenance  Topic Date Due   OPHTHALMOLOGY EXAM  Never done   Hepatitis C Screening  Never done   DEXA SCAN  Never done   COVID-19 Vaccine (3 - Moderna risk series) 01/29/2021 (Originally 01/13/2021)  INFLUENZA VACCINE  03/01/2021 (Originally 12/29/2020)   Zoster Vaccines- Shingrix (1 of 2) 03/05/2021 (Originally 01/26/1970)   PNA vac Low Risk  Adult (1 of 2 - PCV13) 12/03/2021 (Originally 01/27/2015)   TETANUS/TDAP  12/17/2021 (Originally 01/26/1970)   HEMOGLOBIN A1C  06/05/2021   FOOT EXAM  12/17/2021   MAMMOGRAM  04/22/2022   COLONOSCOPY (Pts 45-25yrs Insurance coverage will need to be confirmed)  04/27/2027   HPV VACCINES  Aged Out    Health Maintenance  Health Maintenance Due  Topic Date Due   OPHTHALMOLOGY EXAM  Never done   Hepatitis C Screening  Never done   DEXA SCAN  Never done    Colorectal cancer screening: Type of screening: Colonoscopy. Completed 04/26/2017. Repeat every 10 years  Mammogram status: Completed 04/22/2020. Repeat every year  Bone Density status: due  Lung Cancer Screening: (Low Dose CT Chest recommended if Age 82-80 years, 30 pack-year currently smoking OR have quit w/in 15years.) does not qualify.   Lung Cancer Screening Referral: no  Additional Screening:  Hepatitis C Screening: does qualify; due  Vision Screening: Recommended annual ophthalmology exams for early detection of glaucoma and other disorders of the eye. Is the patient up to date with their annual eye exam?  No  Who is the provider or what is the name of the office in which the patient attends annual eye exams? [facility] If pt is not established with a provider, would they like to be referred to a provider to establish care? No .   Dental Screening: Recommended annual dental exams for proper oral hygiene  Community Resource Referral / Chronic Care Management: CRR required this visit?  No   CCM required this visit?  No      Plan:     I have personally reviewed and noted the following in the patient's chart:   Medical and social history Use of alcohol, tobacco or illicit drugs  Current medications and supplements including opioid prescriptions. Patient is not currently taking opioid prescriptions. Functional ability and status Nutritional status Physical activity Advanced directives List of other  physicians Hospitalizations, surgeries, and ER visits in previous 12 months Vitals Screenings to include cognitive, depression, and falls Referrals and appointments  In addition, I have reviewed and discussed with patient certain preventive protocols, quality metrics, and best practice recommendations. A written personalized care plan for preventive services as well as general preventive health recommendations were provided to patient.     Kellie Simmering, LPN   02/14/9149   Nurse Notes:

## 2021-01-19 NOTE — Patient Instructions (Signed)
Cindy [name] , Thank you for taking time to come for your Medicare Wellness Visit. I appreciate your ongoing commitment to your health goals. Please review the following plan we discussed and let me know if I can assist you in the future.   Screening recommendations/referrals: Colonoscopy: completed 04/26/2017 Mammogram: completed 04/22/2020 Bone Density: due Recommended yearly ophthalmology/optometry visit for glaucoma screening and checkup Recommended yearly dental visit for hygiene and checkup  Vaccinations: Influenza vaccine: decline Pneumococcal vaccine: decline Tdap vaccine: decline Shingles vaccine: discussed   Covid-19:12/16/2020, 11/18/2020  Advanced directives: Advance directive discussed with you today.   Conditions/risks identified: dips snuff  Next appointment: Follow up in one year for your annual wellness visit    Preventive Care 71 Years and Older, Female Preventive care refers to lifestyle choices and visits with your health care provider that can promote health and wellness. What does preventive care include? A yearly physical exam. This is also called an annual well check. Dental exams once or twice a year. Routine eye exams. Ask your health care provider how often you should have your eyes checked. Personal lifestyle choices, including: Daily care of your teeth and gums. Regular physical activity. Eating a healthy diet. Avoiding tobacco and drug use. Limiting alcohol use. Practicing safe sex. Taking low-dose aspirin every day. Taking vitamin and mineral supplements as recommended by your health care provider. What happens during an annual well check? The services and screenings done by your health care provider during your annual well check will depend on your age, overall health, lifestyle risk factors, and family history of disease. Counseling  Your health care provider may ask you questions about your: Alcohol use. Tobacco use. Drug use. Emotional  well-being. Home and relationship well-being. Sexual activity. Eating habits. History of falls. Memory and ability to understand (cognition). Work and work Statistician. Reproductive health. Screening  You may have the following tests or measurements: Height, weight, and BMI. Blood pressure. Lipid and cholesterol levels. These may be checked every 5 years, or more frequently if you are over 30 years old. Skin check. Lung cancer screening. You may have this screening every year starting at age 71 if you have a 30-pack-year history of smoking and currently smoke or have quit within the past 15 years. Fecal occult blood test (FOBT) of the stool. You may have this test every year starting at age 71. Flexible sigmoidoscopy or colonoscopy. You may have a sigmoidoscopy every 5 years or a colonoscopy every 10 years starting at age 71. Hepatitis C blood test. Hepatitis B blood test. Sexually transmitted disease (STD) testing. Diabetes screening. This is done by checking your blood sugar (glucose) after you have not eaten for a while (fasting). You may have this done every 1-3 years. Bone density scan. This is done to screen for osteoporosis. You may have this done starting at age 71. Mammogram. This may be done every 1-2 years. Talk to your health care provider about how often you should have regular mammograms. Talk with your health care provider about your test results, treatment options, and if necessary, the need for more tests. Vaccines  Your health care provider may recommend certain vaccines, such as: Influenza vaccine. This is recommended every year. Tetanus, diphtheria, and acellular pertussis (Tdap, Td) vaccine. You may need a Td booster every 10 years. Zoster vaccine. You may need this after age 1. Pneumococcal 13-valent conjugate (PCV13) vaccine. One dose is recommended after age 71. Pneumococcal polysaccharide (PPSV23) vaccine. One dose is recommended after age 71. Talk to  your  health care provider about which screenings and vaccines you need and how often you need them. This information is not intended to replace advice given to you by your health care provider. Make sure you discuss any questions you have with your health care provider. Document Released: 06/13/2015 Document Revised: 02/04/2016 Document Reviewed: 03/18/2015 Elsevier Interactive Patient Education  2017 [place] Prevention in the Home Falls can cause injuries. They can happen to people of all ages. There are many things you can do to make your home safe and to help prevent falls. What can I do on the outside of my home? Regularly fix the edges of walkways and driveways and fix any cracks. Remove anything that might make you trip as you walk through a door, such as a raised step or threshold. Trim any bushes or trees on the path to your home. Use bright outdoor lighting. Clear any walking paths of anything that might make someone trip, such as rocks or tools. Regularly check to see if handrails are loose or broken. Make sure that both sides of any steps have handrails. Any raised decks and porches should have guardrails on the edges. Have any leaves, snow, or ice cleared regularly. Use sand or salt on walking paths during winter. Clean up any spills in your garage right away. This includes oil or grease spills. What can I do in the bathroom? Use night lights. Install grab bars by the toilet and in the tub and shower. Do not use towel bars as grab bars. Use non-skid mats or decals in the tub or shower. If you need to sit down in the shower, use a plastic, non-slip stool. Keep the floor dry. Clean up any water that spills on the floor as soon as it happens. Remove soap buildup in the tub or shower regularly. Attach bath mats securely with double-sided non-slip rug tape. Do not have throw rugs and other things on the floor that can make you trip. What can I do in the bedroom? Use night  lights. Make sure that you have a light by your bed that is easy to reach. Do not use any sheets or blankets that are too big for your bed. They should not hang down onto the floor. Have a firm chair that has side arms. You can use this for support while you get dressed. Do not have throw rugs and other things on the floor that can make you trip. What can I do in the kitchen? Clean up any spills right away. Avoid walking on wet floors. Keep items that you use a lot in easy-to-reach places. If you need to reach something above you, use a strong step stool that has a grab bar. Keep electrical cords out of the way. Do not use floor polish or wax that makes floors slippery. If you must use wax, use non-skid floor wax. Do not have throw rugs and other things on the floor that can make you trip. What can I do with my stairs? Do not leave any items on the stairs. Make sure that there are handrails on both sides of the stairs and use them. Fix handrails that are broken or loose. Make sure that handrails are as long as the stairways. Check any carpeting to make sure that it is firmly attached to the stairs. Fix any carpet that is loose or worn. Avoid having throw rugs at the top or bottom of the stairs. If you do have throw rugs, attach  them to the floor with carpet tape. Make sure that you have a light switch at the top of the stairs and the bottom of the stairs. If you do not have them, ask someone to add them for you. What else can I do to help prevent falls? Wear shoes that: Do not have high heels. Have rubber bottoms. Are comfortable and fit you well. Are closed at the toe. Do not wear sandals. If you use a stepladder: Make sure that it is fully opened. Do not climb a closed stepladder. Make sure that both sides of the stepladder are locked into place. Ask someone to hold it for you, if possible. Clearly mark and make sure that you can see: Any grab bars or handrails. First and last  steps. Where the edge of each step is. Use tools that help you move around (mobility aids) if they are needed. These include: Canes. Walkers. Scooters. Crutches. Turn on the lights when you go into a dark area. Replace any light bulbs as soon as they burn out. Set up your furniture so you have a clear path. Avoid moving your furniture around. If any of your floors are uneven, fix them. If there are any pets around you, be aware of where they are. Review your medicines with your doctor. Some medicines can make you feel dizzy. This can increase your chance of falling. Ask your doctor what other things that you can do to help prevent falls. This information is not intended to replace advice given to you by your health care provider. Make sure you discuss any questions you have with your health care provider. Document Released: 03/13/2009 Document Revised: 10/23/2015 Document Reviewed: 06/21/2014 Elsevier Interactive Patient Education  2017 [employer].

## 2021-01-27 ENCOUNTER — Telehealth: Payer: Medicare HMO | Admitting: General Practice

## 2021-01-27 ENCOUNTER — Ambulatory Visit: Payer: Self-pay

## 2021-01-27 DIAGNOSIS — E1122 Type 2 diabetes mellitus with diabetic chronic kidney disease: Secondary | ICD-10-CM | POA: Diagnosis not present

## 2021-01-27 DIAGNOSIS — F321 Major depressive disorder, single episode, moderate: Secondary | ICD-10-CM

## 2021-01-27 DIAGNOSIS — E1169 Type 2 diabetes mellitus with other specified complication: Secondary | ICD-10-CM

## 2021-01-27 DIAGNOSIS — E785 Hyperlipidemia, unspecified: Secondary | ICD-10-CM

## 2021-01-27 DIAGNOSIS — I152 Hypertension secondary to endocrine disorders: Secondary | ICD-10-CM

## 2021-01-27 DIAGNOSIS — N184 Chronic kidney disease, stage 4 (severe): Secondary | ICD-10-CM | POA: Diagnosis not present

## 2021-01-27 DIAGNOSIS — L0292 Furuncle, unspecified: Secondary | ICD-10-CM

## 2021-01-27 DIAGNOSIS — I480 Paroxysmal atrial fibrillation: Secondary | ICD-10-CM | POA: Diagnosis not present

## 2021-01-27 DIAGNOSIS — G894 Chronic pain syndrome: Secondary | ICD-10-CM

## 2021-01-27 DIAGNOSIS — L0293 Carbuncle, unspecified: Secondary | ICD-10-CM

## 2021-01-27 DIAGNOSIS — E1159 Type 2 diabetes mellitus with other circulatory complications: Secondary | ICD-10-CM | POA: Diagnosis not present

## 2021-01-27 NOTE — Chronic Care Management (AMB) (Signed)
Chronic Care Management   CCM RN Visit Note  01/27/2021 Name: Cindy [name] MRN: [id number] DOB: 01/15/1951  Subjective: Cindy [name] is a 70 y.o. year old female who is a primary care patient of Cannady, Barbaraann Faster, NP. The care management team was consulted for assistance with disease management and care coordination needs.    Engaged with patient by telephone for follow up visit in response to provider referral for case management and/or care coordination services.   Consent to Services:  The patient was given information about Chronic Care Management services, agreed to services, and gave verbal consent prior to initiation of services.  Please see initial visit note for detailed documentation.   Patient agreed to services and verbal consent obtained.   Assessment: Review of patient past medical history, allergies, medications, health status, including review of consultants reports, laboratory and other test data, was performed as part of comprehensive evaluation and provision of chronic care management services.   SDOH (Social Determinants of Health) assessments and interventions performed:    CCM Care Plan  Allergies  Allergen Reactions   Bupropion Other (See Comments)   Gabapentin Palpitations   Pioglitazone Palpitations   Vicodin [Hydrocodone-Acetaminophen] Other (See Comments)    Reaction: racing heart   Vicoprofen [Hydrocodone-Ibuprofen] Other (See Comments)    Reaction: racing heart    Outpatient Encounter Medications as of 01/27/2021  Medication Sig   acetaminophen (TYLENOL) 650 MG CR tablet Take 650 mg by mouth every 8 (eight) hours as needed for pain.   albuterol (VENTOLIN HFA) 108 (90 Base) MCG/ACT inhaler Inhale 1-2 puffs into the lungs every 6 (six) hours as needed for wheezing or shortness of breath.   apixaban (ELIQUIS) 5 MG TABS tablet Take 1 tablet (5 mg total) by mouth 2 (two) times daily.   atorvastatin (LIPITOR) 40 MG tablet Take 40 mg by mouth daily.    cetirizine (ZYRTEC) 10 MG tablet Take 10 mg by mouth daily.   diltiazem (CARDIZEM CD) 360 MG 24 hr capsule Take 360 mg by mouth daily.   DULoxetine (CYMBALTA) 60 MG capsule Take 60 mg by mouth daily.   ferrous sulfate 325 (65 FE) MG tablet Take 1 tablet (325 mg total) by mouth 2 (two) times daily with a meal.   fluticasone (FLONASE) 50 MCG/ACT nasal spray Place 2 sprays into both nostrils daily.   furosemide (LASIX) 80 MG tablet Take 80 mg by mouth 2 (two) times daily.   metFORMIN (GLUCOPHAGE) 1000 MG tablet Take 500 mg by mouth 2 (two) times daily with a meal.   metoprolol succinate (TOPROL-XL) 100 MG 24 hr tablet Take 50-100 mg by mouth 2 (two) times daily. Take 1 tablet in the morning and one-half tablet in the evening. Take with or immediately following a meal.   minoxidil (LONITEN) 10 MG tablet Take 10 mg by mouth daily.   montelukast (SINGULAIR) 10 MG tablet Take 10 mg by mouth daily as needed (allergies).    Multiple Vitamin (MULTIVITAMIN WITH MINERALS) TABS tablet Take 1 tablet by mouth daily.   olmesartan (BENICAR) 40 MG tablet Take 1 tablet (40 mg total) by mouth daily.   omeprazole (PRILOSEC) 20 MG capsule Take 20 mg by mouth daily as needed (heartburn).    polyethylene glycol powder (GLYCOLAX/MIRALAX) powder Take 17 g by mouth daily.    Semaglutide,0.25 or 0.5MG/DOS, (OZEMPIC, 0.25 OR 0.5 MG/DOSE,) 2 MG/1.5ML SOPN Start with 0.25MG once a week x 4 weeks, then increase to 0.5MG weekly.   spironolactone (ALDACTONE) 25  MG tablet Take 25 mg by mouth daily.   No facility-administered encounter medications on file as of 01/27/2021.    Patient Active Problem List   Diagnosis Date Noted   DOE (dyspnea on exertion) 01/13/2021   Post-COVID syndrome 12/03/2020   Recurrent boils 12/03/2020   Morbid obesity ([place]) 12/03/2020   CKD stage 4 due to type 2 diabetes mellitus ([place]) 12/01/2020   Aortic atherosclerosis ([place]) 12/01/2020   Hypertension associated with diabetes ([place]) 07/15/2020    A-fib ([place]) 07/15/2020   Chronic pain syndrome 07/15/2020   Depression, major, single episode, moderate ([place]) 07/15/2020   Type 2 diabetes mellitus with morbid obesity ([place]) 07/15/2020   GERD (gastroesophageal reflux disease) 07/15/2020   Hyperlipidemia associated with type 2 diabetes mellitus ([place]) 07/15/2020   OSA (obstructive sleep apnea) 07/15/2020   Other thrombophilia ([place]) 03/12/2020   History of lumbar spinal fusion 04/26/2018   Diuretic-induced hypokalemia 03/24/2018   Hypomagnesemia 03/24/2018   Hypertensive retinopathy of both eyes 06/08/2017   DDD (degenerative disc disease), lumbar 10/31/2014   Cervical spondylosis with myelopathy 02/22/2014   Iron deficiency anemia 02/22/2014   Mitral regurgitation 09/08/2013    Conditions to be addressed/monitored:Atrial Fibrillation, HTN, HLD, DMII, CKD Stage 4, Depression, and chronic pain and reoccurring boils  Care Plan : RNCM: General plan of care for AFIB, HTN, HLD, CKD4, DM, Chronic pain, and Depression  Updates made by Vanita Ingles, RN since 01/27/2021 12:00 AM     Problem: RNCM: General plan of care for AFIB, HTN, HLD, CKD4, DM, Chronic pain, and Depression   Priority: High     Long-Range Goal: RNCM: General plan of care for AFIB, HTN, HLD, CKD4, DM, Chronic pain, and Depression   Start Date: 12/24/2020  Expected End Date: 12/24/2021  This Visit's Progress: On track  Recent Progress: On track  Priority: High  Note:   Current Barriers:  Knowledge Deficits related to plan of care for management of Atrial Fibrillation, HTN, HLD, DMII, CKD Stage 4, Depression, and chronic pain   Care Coordination needs related to Financial constraints related to difficulty in affording medications , Level of care concerns, and Mental Health Concerns  in a patient with Atrial Fibrillation, HTN, HLD, DMII, CKD Stage 4, Depression, and chronic pain  Chronic Disease Management support and education needs related to Atrial Fibrillation, HTN, HLD,  DMII, CKD Stage 4, Depression, and chronic pain  Difficulty obtaining medications  RNCM Clinical Goal(s):  Patient will verbalize understanding of plan for management of Atrial Fibrillation, HTN, HLD, DMII, CKD Stage 4, Depression, and chronic pain  work with All embedded care coordination to address needs related to Atrial Fibrillation, HTN, HLD, DMII, CKD Stage 4, Depression, and chronic pain  and Financial constraints related to medication cost  and Mental Health Concerns  take all medications exactly as prescribed and will call provider for medication related questions attend all scheduled medical appointments: 03-18-2021 at 1120 am demonstrate a decrease in Atrial Fibrillation, HTN, HLD, DMII, CKD Stage 4, Depression, and Chronic pain  exacerbations demonstrate improved adherence to prescribed treatment plan for Atrial Fibrillation, HTN, HLD, DMII, CKD Stage 4, Depression, and chronic pain  demonstrate improved health management independence verbalize basic understanding of Atrial Fibrillation, HTN, HLD, DMII, CKD Stage 4, Depression, and Chronic pain  disease process and self health management plan demonstrate understanding of rationale for each prescribed medication work with CM clinical social worker to help with building coping skills and handle stress over multiple chronic conditions  demonstrate ongoing self health  care management ability through collaboration with RN Care manager, provider, and care team.   Interventions: 1:1 collaboration with primary care provider regarding development and update of comprehensive plan of care as evidenced by provider attestation and co-signature Inter-disciplinary care team collaboration (see longitudinal plan of care)   A-fib:  (Status: Goal on track: YES.) Counseled on increased risk of stroke due to Afib and benefits of anticoagulation for stroke prevention; Reviewed importance of adherence to anticoagulant exactly as prescribed. 01-27-2021:  the patient was approved for help with Eliquis until the end of the year. The patient is compliant with medications and is happy that she has received help for the Eliquis as she was in the donut hole. She states getting help with the medication has helped a whole lot.  Advised patient to discuss changes in AFIB, concerns or questions  with provider; Counseled on bleeding risk associated with Eliquis and importance of self-monitoring for signs/symptoms of bleeding; Counseled on avoidance of NSAIDs due to increased bleeding risk with anticoagulants; Counseled on importance of regular laboratory monitoring as prescribed; Counseled on seeking medical attention after a head injury or if there is blood in the urine/stool; Afib action plan reviewed;  Diabetes:  (Status: Goal on track: YES.) Lab Results  Component Value Date   HGBA1C 7.6 (H) 12/03/2020  Assessed patient's understanding of A1c goal: <7% Provided education to patient about basic DM disease process; Reviewed medications with patient and discussed importance of medication adherence. 01-27-2021: The patient states that she is taking the Jardiance and ozempic as directed. She has been approved for help with the Jardiance until the end of the year; Counseled on importance of regular laboratory monitoring as prescribed. 01-27-2021: The patient likely will have new lab work in October.  Discussed plans with patient for ongoing care management follow up and provided patient with direct contact information for care management team; Provided patient with written educational materials related to hypo and hyperglycemia and importance of correct treatment; Reviewed scheduled/upcoming provider appointments including: 03-18-2021 at 1120 am; Advised patient, providing education and rationale, to check cbg bid and record, calling pcp for findings outside established parameters. 01-27-2021: The patient states that her blood sugar this am was 189.  The patient  states it has been going up and down. She has started taking the Jardiance again. Education provided that blood sugars should stabilize with taking Jardiance and Ozempic.  Education on continuing to check blood sugars and call for extreme changes in blood sugar readings.  Referral made to pharmacy team for assistance with obtaining financial assistance with Ozempic and other medications, patient spoke to pharmacist on 12-23-2020. 01-27-2021: Working with the pharmacy team for assistance with medications. ; Review of patient status, including review of consultants reports, relevant laboratory and other test results, and medications completed; Advised patient to discuss questions or concern about her DM  with provider, the patient was upset that she "messed up" her Ozempic dose this am and it was a wasted dose. Reflective listening and support given. Education on how to take [place]. Her daughters came and assisted her and will assist again next week until she is comfortable giving the Ozempic to herself. The patient is a little concerned about the paperwork and feels the insurance will not pay for the Ozempic. Reviewed conversation she had with pharm D on 12-23-2020, per the notes the pharm D is working with the patient to help with financial constraints of medications, including Ozempic. 01-27-2021: the patient is doing well with the Ozempic and  is not taking Jardiance again. The patient states she is having boils continue to come up in different places on her body, usually between her breast and buttocks. States she has one on her buttocks now. She has been putting cream from the dermatologist on it and that is helping. Has went to wound clinic before for an infected boil and was treated successfully. The patient wants to know what to do for reoccurring "boils". Advised the patient to follow previous recommendations from the provider and to discuss this also with the pcp on her visit in October. The patient states  the cream does take the soreness out. Review of sx and sx of infection and call the office for new onset of fever, chills, or drainage consistent with infection. The patient states that she has not had any sx and sx of infection. Also advised the patient that regulation of blood sugars should also help with occurrence of boils.  Will continue to monitor for changes;   Hyperlipidemia:  (Status: Goal on track: YES.) Lab Results  Component Value Date   CHOL 152 12/03/2020   Lab Results  Component Value Date   HDL 43 12/03/2020   Lab Results  Component Value Date   LDLCALC 87 12/03/2020   Lab Results  Component Value Date   TRIG 123 12/03/2020  No results found for: CHOLHDL No results found for: LDLDIRECT  Medication review performed; medication list updated in electronic medical record. Takes Lipitor 40 mg for HLD control  Provider established cholesterol goals reviewed; Counseled on importance of regular laboratory monitoring as prescribed; Provided HLD educational materials; Reviewed role and benefits of statin for ASCVD risk reduction; Discussed strategies to manage statin-induced myalgias; Reviewed importance of limiting foods high in cholesterol 01-27-2021: the patient endorses compliance with heart healthy/ADA diet  Hypertension: (Status: Goal on track: YES.) Last practice recorded BP readings:  BP Readings from Last 3 Encounters:  01/13/21 120/72  12/17/20 (!) 128/55  12/03/20 (!) 126/56  Most recent eGFR/CrCl:  Lab Results  Component Value Date   EGFR 34 (L) 12/03/2020    No components found for: CRCL  Evaluation of current treatment plan related to hypertension self management and patient's adherence to plan as established by provider. The patient states that sometimes her blood pressure goes lower but states recently her systolic has been 811 to 572 range and diastolic 50 to 60. Says that she does get light headed some times and dizzy but not recently. Discussed changing  position slowly and monitoring for orthostatic hypotension. Explained what orthostatic hypotension was and to be safe when changing positions due to the possibility of blood pressure dropping. 01-27-2021: The patient states she is doing well with management of her HTN. Saw cardiologist recently and pulmonary. Lisinopril discontinued and the patient was started on olmesartan 40 mg. The patient states her cough is better. The patient states the cardiologist may take her off of some of her medications at next visit. States she feels good.  Provided education to patient re: stroke prevention, s/s of heart attack and stroke; Reviewed medications with patient and discussed importance of compliance. 01-27-2021: States compliance with medications ; Discussed plans with patient for ongoing care management follow up and provided patient with direct contact information for care management team; Advised patient, providing education and rationale, to monitor blood pressure daily and record, calling PCP for findings outside established parameters;  Reviewed scheduled/upcoming provider appointments including: 03-18-2021 at 1120 am Provided education on prescribed diet heart healthy/ADA diet ;  Discussed complications of poorly controlled blood pressure such as heart disease, stroke, circulatory complications, vision complications, kidney impairment, sexual dysfunction;    CKD4 (Status: Goal on track: YES.) Evaluation of current treatment plan related to CKD Stage 4 ,  self-management and patient's adherence to plan as established by provider. Discussed plans with patient for ongoing care management follow up and provided patient with direct contact information for care management team Evaluation of current treatment plan related to CKD4 and patient's adherence to plan as established by provider. 01-27-2021: The patient is doing well and denies any issues at this time with her kidneys. States she is feeling much better and  thankful her health is better after COVID and hospitalization. Will continue to monitor.  Advised patient to call the office for changes in condition, questions or concerns; Provided education to patient re: staying hydrated, eating healthy, pacing activity, keeping follow up with pcp and specialist; Reviewed medications with patient and discussed compliance. 01-27-2021: Is compliant with medications.; Reviewed scheduled/upcoming provider appointments including 03-18-2021 at 1120 am; Discussed plans with patient for ongoing care management follow up and provided patient with direct contact information for care management team;   Depression (Status: Goal on track: YES.) Evaluation of current treatment plan related to Depression, Mental Health Concerns  self-management and patient's adherence to plan as established by provider. Discussed plans with patient for ongoing care management follow up and provided patient with direct contact information for care management team Evaluation of current treatment plan related to depression  and patient's adherence to plan as established by provider. 01-27-2021: The patient is doing well and just celebrated her 70st birthday. Her family took her out on Sunday and she had a good day on her actual birthday yesterday. The patient states she is doing much better and happy that her health is improving. Denies any issues with depression at this time. Will continue to monitor.  Advised patient to call the office for changes in mood, anxiety, or increased episodes of depression; Provided education to patient re: working with CCM team to effectively manage her depression; Reviewed medications with patient and discussed compliance ; Reviewed scheduled/upcoming provider appointments including: 03-18-2021 at 1120 am Social Work referral for assistance with coping skills and recommendations for effective management of depression. 01-27-2021: Actively working with the [employer] for  ongoing support and education; Discussed plans with patient for ongoing care management follow up and provided patient with direct contact information for care management team; Screening for signs and symptoms of depression related to chronic disease state;  Assessed social determinant of health barriers;   Pain:  (Status: Goal on track: YES.) Pain assessment performed Medications reviewed Reviewed provider established plan for pain management. 01-27-2021: The patient is using Tylenol arthritis when she needs to for pain. States pain varies daily. Denies any pain today. Knows what to do for flare ups. States her butt cheek hurts sometimes and she thinks it is from "sciatica". Denies any acute distress. Will continue to monitor. Discussed importance of adherence to all scheduled medical appointments; Counseled on the importance of reporting any/all new or changed pain symptoms or management strategies to pain management provider; Advised patient to report to care team affect of pain on daily activities; Discussed use of relaxation techniques and/or diversional activities to assist with pain reduction (distraction, imagery, relaxation, massage, acupressure, TENS, heat, and cold application; Reviewed with patient prescribed pharmacological and nonpharmacological pain relief strategies; Advised patient to discuss increased level or intensity of pain or uncontrolled pain  with  provider;  Patient Goals/Self-Care Activities: Patient will self administer medications as prescribed Patient will attend all scheduled provider appointments Patient will call pharmacy for medication refills Patient will attend church or other social activities Patient will continue to perform ADL's independently Patient will continue to perform IADL's independently Patient will call provider office for new concerns or questions  Follow Up Plan: Telephone follow up appointment with care management team member scheduled for:  03-31-2021 at 1 pm      Plan:Telephone follow up appointment with care management team member scheduled for:  03-31-2021 at 1 pm  Noreene Larsson RN, MSN, [facility] Mobile: [phone]

## 2021-01-27 NOTE — Patient Instructions (Signed)
Visit Information  PATIENT GOALS:  Goals Addressed             This Visit's Progress    RNCM: Manage Chronic Pain       Timeframe:  Long-Range Goal Priority:  High Start Date:    12-24-2020                         Expected End Date:  12-24-2021                     Follow Up Date 03/31/2021    - call for medicine refill 2 or 3 days before it runs out - develop a personal pain management plan - keep track of prescription refills - plan exercise or activity when pain is best controlled - prioritize tasks for the day - track times pain is worst and when it is best - track what makes the pain worse and what makes it better - use ice or heat for pain relief - work slower and less intense when having pain    Why is this important?   Day-to-day life can be hard when you have chronic pain.  Pain medicine is just one piece of the treatment puzzle.  You can try these action steps to help you manage your pain.    Notes: 12-24-2020: The patient states that she has pain a lot but it is not bad every day. She states lots of times the pain in in her back and goes down her hip and leg. She takes one or two tylenol for pain relief. She also takes Cymbalta. She will use heat and cold application when needed, she states this is helpful. Denies any acute pain today. Will continue to monitor. 01-27-2021: the patient states her pain varies from day to day. She takes Tylenol arthritis when needed and that is usually very helpful with pain control. Denies any acute distress today. Denies any safety concerns or falls at this time. Will continue to monitor.      RNCM: Manage My Emotions       Timeframe:  Long-Range Goal Priority:  High Start Date:    12-24-2020                         Expected End Date:     12-24-2021                  Follow Up Date 03/31/2021    - call and visit an old friend - laugh; watch a funny movie or comedian - learn and use visualization or guided imagery - perform a random act  of kindness - practice relaxation or meditation daily - talk about feelings with a friend, family or spiritual advisor - practice positive thinking and self-talk    Why is this important?   When you are stressed, down or upset, your body reacts too.  For example, your blood pressure may get higher; you may have a headache or stomachache.  When your emotions get the best of you, your body's ability to fight off cold and flu gets weak.  These steps will help you manage your emotions.     Notes: 12-24-2020: The patient says that she is stressed out about her health right now. Since having COVID she has had set backs in her condition. She still does not have taste or smell but is eating. She is appreciative of CCM team and  helping her with questions and concerns she has. She denies any acute distress. Is taking medications as directed. Will continue to monitor. 01-27-2021: the patient is doing so much better now and is thankful. She feels better and has seen specialist recently. She has had some medication changes that have improved her sx/sx of cough. The provider felt the Lisinopril was the cause of her cough and has changed it to olmesartan. The patient states she feels it is working well for her. Does have new concerns with the "boils" that keep popping up on her in different areas. Will collaborate with the pcp about these areas. Instructed the patient to follow previous recommendations by the provider for care of boils.      RNCM: Manage My Medicine       Timeframe:  Long-Range Goal Priority:  High Start Date:      12-24-2020                       Expected End Date:       12-24-2021                Follow Up Date 03/31/2021    - call for medicine refill 2 or 3 days before it runs out - call if I am sick and can't take my medicine - keep a list of all the medicines I take; vitamins and herbals too - learn to read medicine labels - use a pillbox to sort medicine - use an alarm clock or phone to  remind me to take my medicine    Why is this important?   These steps will help you keep on track with your medicines.   Notes: 12-24-2020: The patient spoke to the pharm D on 12-23-2020, they are working with her for assistance with some of her medications. The patient was upset with herself this am because she was trying to give herself her first dose of Ozempic and she forgot to take the cap off and wasted it in the tube. Her daughters came over and helped her with the dose. She said I watched carefully last week when Jolene gave it to me and I thought I was doing it right. Reflective listening and encouragement given to the patient. Explained this happens to all of [place] and let her talk her feelings out. Will continue to work with patient for education and support. 01-27-2021: the patient is happy that she was approved to get her jardiance and her Eliquis for the rest of the year as she was in the donut hole. She says this has helped a lot and she is so appreciative. She continues to take her Ozempic and is doing well with administration of Ozempic at this time.      RNCM: Track and Manage My Symptoms-Depression       Timeframe:  Long-Range Goal Priority:  High Start Date:     12-24-2020                        Expected End Date:        12-24-2021               Follow Up Date 03/31/2021    - avoid negative self-talk - develop a personal safety plan - develop a plan to deal with triggers like holidays, anniversaries - exercise at least 2 to 3 times per week - have a plan for how to handle bad days - journal  feelings and what helps to feel better or worse - spend time or talk with others at least 2 to 3 times per week - spend time or talk with others every day - watch for early signs of feeling worse    Why is this important?   Keeping track of your progress will help your treatment team find the right mix of medicine and therapy for you.  Write in your journal every day.  Day-to-day changes in  depression symptoms are normal. It may be more helpful to check your progress at the end of each week instead of every day.     Notes: 12-24-2020: The patient has a good family support system. She has had a hard year and wants to improve her health and well being. She states she has to work through things and she is thankful for the help and support she is getting. 01-27-2021: The patient is feeling much better and doing much better since last outreach. Working with the pcp, CCM team and specialist to get her health and wellness on track. She is happy about the help she has received with getting her jardiance and Eliquis. The patient turned 46 yesterday and she states it was a good day and her family took her out to eat on Sunday for her birthday. She is appreciative of the CCM team and all help that has been provided her.         The patient verbalized understanding of instructions, educational materials, and care plan provided today and declined offer to receive copy of patient instructions, educational materials, and care plan.   Telephone follow up appointment with care management team member scheduled for: 03-31-2021 at 1 pm  Noreene Larsson RN, MSN, [facility] Mobile: [phone]

## 2021-02-19 DIAGNOSIS — G4733 Obstructive sleep apnea (adult) (pediatric): Secondary | ICD-10-CM | POA: Diagnosis not present

## 2021-03-12 ENCOUNTER — Ambulatory Visit (INDEPENDENT_AMBULATORY_CARE_PROVIDER_SITE_OTHER): Payer: Medicare HMO | Admitting: Licensed Clinical Social Worker

## 2021-03-12 DIAGNOSIS — G894 Chronic pain syndrome: Secondary | ICD-10-CM

## 2021-03-12 DIAGNOSIS — E1159 Type 2 diabetes mellitus with other circulatory complications: Secondary | ICD-10-CM

## 2021-03-12 DIAGNOSIS — E1169 Type 2 diabetes mellitus with other specified complication: Secondary | ICD-10-CM

## 2021-03-12 DIAGNOSIS — I152 Hypertension secondary to endocrine disorders: Secondary | ICD-10-CM

## 2021-03-12 DIAGNOSIS — M5136 Other intervertebral disc degeneration, lumbar region: Secondary | ICD-10-CM

## 2021-03-12 DIAGNOSIS — F321 Major depressive disorder, single episode, moderate: Secondary | ICD-10-CM

## 2021-03-12 DIAGNOSIS — I480 Paroxysmal atrial fibrillation: Secondary | ICD-10-CM

## 2021-03-12 NOTE — Patient Instructions (Signed)
Visit Information   Goals Addressed             This Visit's Progress    Manage My Emotions   On track    Timeframe:  Long-Range Goal Priority:  Medium Start Date:      01/16/21                       Expected End Date:   05/30/21                    Follow Up Date 05/18/21   Patient Goals/Self-Care Activities: Over the next 120 days Contact clinic with any questions or concerns Utilize healthy coping skills discussed and continue compliance with meds Attend scheduled appointments with providers        Patient verbalizes understanding of instructions provided today.   Telephone follow up appointment with care management team member scheduled for:05/18/21  Christa See, MSW, [name] Phone [phone] 4:29 PM

## 2021-03-12 NOTE — Chronic Care Management (AMB) (Signed)
Chronic Care Management    Clinical Social Work Note  03/12/2021 Name: Cindy [name] MRN: [id number] DOB: 04/26/1951  Cindy [name] is a 70 y.o. year old female who is a primary care patient of Cannady, Barbaraann Faster, NP. The CCM team was consulted to assist the patient with chronic disease management and/or care coordination needs related to: Mental Health Counseling and Resources.   Engaged with patient by telephone for follow up visit in response to provider referral for social work chronic care management and care coordination services.   Consent to Services:  The patient was given information about Chronic Care Management services, agreed to services, and gave verbal consent prior to initiation of services.  Please see initial visit note for detailed documentation.   Patient agreed to services and consent obtained.   Consent to Services:  The patient was given information about Care Management services, agreed to services, and gave verbal consent prior to initiation of services.  Please see initial visit note for detailed documentation.   Patient agreed to services today and consent obtained.  Engaged with patient by phone in response to provider referral for social work care coordination services:  Assessment/Interventions:  Patient continues to maintain positive progress with care plan goals. She reports a decrease in depression symptoms and compliance with med management. Patient is interested in learning about healthy blood pressure and blood sugar levels to better manage health conditions. CCM LCSW will inform CCM RNCM. Patient has an upcoming appt with PCP to further address dizzy spells and occasional low blood sugar readings  See Care Plan below for interventions and patient self-care activities.  Recent life changes or stressors: Management of health conditions  Recommendation: Patient may benefit from, and is in agreement work with LCSW to address care coordination needs and  will continue to work with the clinical team to address health care and disease management related needs.   Follow up Plan: Patient would like continued follow-up from CCM LCSW .  per patient's request will follow up in 05/18/21.  Will call office if needed prior to next encounter.  SDOH (Social Determinants of Health) assessments and interventions performed:  NA  Advanced Directives Status: Not addressed in this encounter.  CCM Care Plan  Allergies  Allergen Reactions   Bupropion Other (See Comments)   Gabapentin Palpitations   Pioglitazone Palpitations   Vicodin [Hydrocodone-Acetaminophen] Other (See Comments)    Reaction: racing heart   Vicoprofen [Hydrocodone-Ibuprofen] Other (See Comments)    Reaction: racing heart    Outpatient Encounter Medications as of 03/12/2021  Medication Sig   acetaminophen (TYLENOL) 650 MG CR tablet Take 650 mg by mouth every 8 (eight) hours as needed for pain.   albuterol (VENTOLIN HFA) 108 (90 Base) MCG/ACT inhaler Inhale 1-2 puffs into the lungs every 6 (six) hours as needed for wheezing or shortness of breath.   apixaban (ELIQUIS) 5 MG TABS tablet Take 1 tablet (5 mg total) by mouth 2 (two) times daily.   atorvastatin (LIPITOR) 40 MG tablet Take 40 mg by mouth daily.   cetirizine (ZYRTEC) 10 MG tablet Take 10 mg by mouth daily.   diltiazem (CARDIZEM CD) 360 MG 24 hr capsule Take 360 mg by mouth daily.   DULoxetine (CYMBALTA) 60 MG capsule Take 60 mg by mouth daily.   ferrous sulfate 325 (65 FE) MG tablet Take 1 tablet (325 mg total) by mouth 2 (two) times daily with a meal.   fluticasone (FLONASE) 50 MCG/ACT nasal spray Place 2 sprays  into both nostrils daily.   furosemide (LASIX) 80 MG tablet Take 80 mg by mouth 2 (two) times daily.   metFORMIN (GLUCOPHAGE) 1000 MG tablet Take 500 mg by mouth 2 (two) times daily with a meal.   metoprolol succinate (TOPROL-XL) 100 MG 24 hr tablet Take 50-100 mg by mouth 2 (two) times daily. Take 1 tablet in the  morning and one-half tablet in the evening. Take with or immediately following a meal.   minoxidil (LONITEN) 10 MG tablet Take 10 mg by mouth daily.   montelukast (SINGULAIR) 10 MG tablet Take 10 mg by mouth daily as needed (allergies).    Multiple Vitamin (MULTIVITAMIN WITH MINERALS) TABS tablet Take 1 tablet by mouth daily.   olmesartan (BENICAR) 40 MG tablet Take 1 tablet (40 mg total) by mouth daily.   omeprazole (PRILOSEC) 20 MG capsule Take 20 mg by mouth daily as needed (heartburn).    polyethylene glycol powder (GLYCOLAX/MIRALAX) powder Take 17 g by mouth daily.    Semaglutide,0.25 or 0.5MG /DOS, (OZEMPIC, 0.25 OR 0.5 MG/DOSE,) 2 MG/1.5ML SOPN Start with 0.25MG  once a week x 4 weeks, then increase to 0.5MG  weekly.   spironolactone (ALDACTONE) 25 MG tablet Take 25 mg by mouth daily.   No facility-administered encounter medications on file as of 03/12/2021.    Patient Active Problem List   Diagnosis Date Noted   DOE (dyspnea on exertion) 01/13/2021   Post-COVID syndrome 12/03/2020   Recurrent boils 12/03/2020   Morbid obesity ([place]) 12/03/2020   CKD stage 4 due to type 2 diabetes mellitus ([place]) 12/01/2020   Aortic atherosclerosis ([place]) 12/01/2020   Hypertension associated with diabetes ([place]) 07/15/2020   A-fib ([place]) 07/15/2020   Chronic pain syndrome 07/15/2020   Depression, major, single episode, moderate ([place]) 07/15/2020   Type 2 diabetes mellitus with morbid obesity ([place]) 07/15/2020   GERD (gastroesophageal reflux disease) 07/15/2020   Hyperlipidemia associated with type 2 diabetes mellitus ([place]) 07/15/2020   OSA (obstructive sleep apnea) 07/15/2020   Other thrombophilia ([place]) 03/12/2020   History of lumbar spinal fusion 04/26/2018   Diuretic-induced hypokalemia 03/24/2018   Hypomagnesemia 03/24/2018   Hypertensive retinopathy of both eyes 06/08/2017   DDD (degenerative disc disease), lumbar 10/31/2014   Cervical spondylosis with myelopathy 02/22/2014   Iron deficiency  anemia 02/22/2014   Mitral regurgitation 09/08/2013    Conditions to be addressed/monitored: Depression; Mental Health Concerns   Care Plan : General Social Work (Adult)  Updates made by Rebekah Chesterfield, LCSW since 03/12/2021 12:00 AM     Problem: Coping Skills (General Plan of Care)      Goal: Coping Skills Enhanced   Start Date: 01/16/2021  This Visit's Progress: On track  Recent Progress: On track  Priority: High  Note:   Current barriers:   Acute Mental Health needs related to depression Mental Health Concerns  Needs Support, Education, and Care Coordination in order to meet unmet mental health needs. Clinical Goal(s): Over the next 120 days, patient will work with SW, counselor and therapist to reduce or manage symptoms of agitation, mood instability, stress, and bipolar until connected for ongoing counseling. Clinical Interventions:  1:1 collaboration with primary care provider regarding development and update of comprehensive plan of care as evidenced by provider attestation and co-signature Inter-disciplinary care team collaboration (see longitudinal plan of care) Assessed patient's previous and current treatment, coping skills, support system and barriers to care  Patient reports difficulty managing symptoms of depression triggered by chronic pain, chronic health conditions, and limited mobility She  endorses recent decrease in symptoms (low energy, feeling sad, and anxiety about pain) since "feeling better and becoming stronger" after multiple hospital visits the beginning of 2022 10/13: Patient reports depression symptoms are managed. "I still have my good and bad days but things are going pretty good" Patient endorses dizzy spells occasionally, believing it is a side effect of her medications. Denies recent falls Patient endorses instances of low blood sugar. When this occurs, patient will drink orange juice or eat a peppermint candy. Patient is unsure as to what is an  appropriate blood sugar/blood pressure reading. CCM LCSW will collaborate with CCM RNCM to provide educational information to promote health and well-being CCM LCSW provided validation and encouragement. Healthy coping skills were discussed to assist with management of symptoms Patient was successful in identifying healthy coping skills that she can utilize daily. Enjoys driving to maintain independence 10/13: Patient continues to utilize healthy coping skills when she experiences depression symptoms. No report of SI/HI Patient has a strong support system. Patient was excited about approval for medicine to assist with financial strain 10/13: Patient's daughter will provide transportation and emotional support at upcoming PCP appointment CCM LCSW informed patient of Medicare's Open Enrollement Period and answered questions patient had regarding various benefits with Medicare Plans. LCSW encouraged patient to contact [facility] with any specific questions regarding her plan; however, patient is not interested in changing her insurance plan at this time Patient walks in the house and participates in exercise to promote positive mood. She utilizes a walker and cane Patient reports compliance with medication management CCM LCSW reviewed upcoming appointments with patient CCM LCSW discussed strategies to reduce fall risk Mindfulness or Relaxation Training, Active listening / Reflection utilized , Emotional Supportive Provided, Provided psychoeducation for mental health needs , Reviewed mental health medications with patient and discussed compliance: , Verbalization of feelings encouraged , and Suicidal Ideation/Homicidal Ideation assessed: Patient denies SI/HI Patient Goals/Self-Care Activities: Over the next 120 days Contact clinic with any questions or concerns Utilize healthy coping skills discussed and continue compliance with meds Attend scheduled appointments with providers     Christa See, MSW,  [email or web address] Phone [phone] 4:25 PM

## 2021-03-13 ENCOUNTER — Ambulatory Visit: Payer: Self-pay

## 2021-03-13 DIAGNOSIS — E1159 Type 2 diabetes mellitus with other circulatory complications: Secondary | ICD-10-CM

## 2021-03-13 DIAGNOSIS — R42 Dizziness and giddiness: Secondary | ICD-10-CM

## 2021-03-13 DIAGNOSIS — E1169 Type 2 diabetes mellitus with other specified complication: Secondary | ICD-10-CM

## 2021-03-13 NOTE — Chronic Care Management (AMB) (Signed)
Chronic Care Management   CCM RN Visit Note  03/13/2021 Name: Cindy [name] MRN: [id number] DOB: 1951/02/24  Subjective: Cindy [name] is a 70 y.o. year old female who is a primary care patient of Cannady, Barbaraann Faster, NP. The care management team was consulted for assistance with disease management and care coordination needs.    Engaged with patient by telephone for follow up visit in response to provider referral for case management and/or care coordination services.   Consent to Services:  The patient was given information about Chronic Care Management services, agreed to services, and gave verbal consent prior to initiation of services.  Please see initial visit note for detailed documentation.   Patient agreed to services and verbal consent obtained.   Assessment: Review of patient past medical history, allergies, medications, health status, including review of consultants reports, laboratory and other test data, was performed as part of comprehensive evaluation and provision of chronic care management services.   SDOH (Social Determinants of Health) assessments and interventions performed:    CCM Care Plan  Allergies  Allergen Reactions   Bupropion Other (See Comments)   Gabapentin Palpitations   Pioglitazone Palpitations   Vicodin [Hydrocodone-Acetaminophen] Other (See Comments)    Reaction: racing heart   Vicoprofen [Hydrocodone-Ibuprofen] Other (See Comments)    Reaction: racing heart    Outpatient Encounter Medications as of 03/13/2021  Medication Sig   acetaminophen (TYLENOL) 650 MG CR tablet Take 650 mg by mouth every 8 (eight) hours as needed for pain.   albuterol (VENTOLIN HFA) 108 (90 Base) MCG/ACT inhaler Inhale 1-2 puffs into the lungs every 6 (six) hours as needed for wheezing or shortness of breath.   apixaban (ELIQUIS) 5 MG TABS tablet Take 1 tablet (5 mg total) by mouth 2 (two) times daily.   atorvastatin (LIPITOR) 40 MG tablet Take 40 mg by mouth daily.    cetirizine (ZYRTEC) 10 MG tablet Take 10 mg by mouth daily.   diltiazem (CARDIZEM CD) 360 MG 24 hr capsule Take 360 mg by mouth daily.   DULoxetine (CYMBALTA) 60 MG capsule Take 60 mg by mouth daily.   ferrous sulfate 325 (65 FE) MG tablet Take 1 tablet (325 mg total) by mouth 2 (two) times daily with a meal.   fluticasone (FLONASE) 50 MCG/ACT nasal spray Place 2 sprays into both nostrils daily.   furosemide (LASIX) 80 MG tablet Take 80 mg by mouth 2 (two) times daily.   metFORMIN (GLUCOPHAGE) 1000 MG tablet Take 500 mg by mouth 2 (two) times daily with a meal.   metoprolol succinate (TOPROL-XL) 100 MG 24 hr tablet Take 50-100 mg by mouth 2 (two) times daily. Take 1 tablet in the morning and one-half tablet in the evening. Take with or immediately following a meal.   minoxidil (LONITEN) 10 MG tablet Take 10 mg by mouth daily.   montelukast (SINGULAIR) 10 MG tablet Take 10 mg by mouth daily as needed (allergies).    Multiple Vitamin (MULTIVITAMIN WITH MINERALS) TABS tablet Take 1 tablet by mouth daily.   olmesartan (BENICAR) 40 MG tablet Take 1 tablet (40 mg total) by mouth daily.   omeprazole (PRILOSEC) 20 MG capsule Take 20 mg by mouth daily as needed (heartburn).    polyethylene glycol powder (GLYCOLAX/MIRALAX) powder Take 17 g by mouth daily.    Semaglutide,0.25 or 0.5MG /DOS, (OZEMPIC, 0.25 OR 0.5 MG/DOSE,) 2 MG/1.5ML SOPN Start with 0.25MG  once a week x 4 weeks, then increase to 0.5MG  weekly. (Patient not taking: Reported on  03/13/2021)   spironolactone (ALDACTONE) 25 MG tablet Take 25 mg by mouth daily.   No facility-administered encounter medications on file as of 03/13/2021.    Patient Active Problem List   Diagnosis Date Noted   DOE (dyspnea on exertion) 01/13/2021   Post-COVID syndrome 12/03/2020   Recurrent boils 12/03/2020   Morbid obesity ([place]) 12/03/2020   CKD stage 4 due to type 2 diabetes mellitus ([place]) 12/01/2020   Aortic atherosclerosis ([place]) 12/01/2020   Hypertension  associated with diabetes ([place]) 07/15/2020   A-fib ([place]) 07/15/2020   Chronic pain syndrome 07/15/2020   Depression, major, single episode, moderate ([place]) 07/15/2020   Type 2 diabetes mellitus with morbid obesity ([place]) 07/15/2020   GERD (gastroesophageal reflux disease) 07/15/2020   Hyperlipidemia associated with type 2 diabetes mellitus ([place]) 07/15/2020   OSA (obstructive sleep apnea) 07/15/2020   Other thrombophilia ([place]) 03/12/2020   History of lumbar spinal fusion 04/26/2018   Diuretic-induced hypokalemia 03/24/2018   Hypomagnesemia 03/24/2018   Hypertensive retinopathy of both eyes 06/08/2017   DDD (degenerative disc disease), lumbar 10/31/2014   Cervical spondylosis with myelopathy 02/22/2014   Iron deficiency anemia 02/22/2014   Mitral regurgitation 09/08/2013    Conditions to be addressed/monitored:HTN, DMII, and dizziness and possible orthostatic hypotension  Care Plan : RNCM: General plan of care for AFIB, HTN, HLD, CKD4, DM, Chronic pain, and Depression  Updates made by Vanita Ingles, RN since 03/13/2021 12:00 AM     Problem: RNCM: General plan of care for AFIB, HTN, HLD, CKD4, DM, Chronic pain, and Depression   Priority: High     Long-Range Goal: RNCM: General plan of care for AFIB, HTN, HLD, CKD4, DM, Chronic pain, and Depression   Start Date: 12/24/2020  Expected End Date: 12/24/2021  This Visit's Progress: On track  Recent Progress: On track  Priority: High  Note:   Current Barriers:  Knowledge Deficits related to plan of care for management of Atrial Fibrillation, HTN, HLD, DMII, CKD Stage 4, Depression, and chronic pain   Care Coordination needs related to Financial constraints related to difficulty in affording medications , Level of care concerns, and Mental Health Concerns  in a patient with Atrial Fibrillation, HTN, HLD, DMII, CKD Stage 4, Depression, and chronic pain  Chronic Disease Management support and education needs related to Atrial Fibrillation, HTN,  HLD, DMII, CKD Stage 4, Depression, and chronic pain  Difficulty obtaining medications  RNCM Clinical Goal(s):  Patient will verbalize understanding of plan for management of Atrial Fibrillation, HTN, HLD, DMII, CKD Stage 4, Depression, and chronic pain  work with All embedded care coordination to address needs related to Atrial Fibrillation, HTN, HLD, DMII, CKD Stage 4, Depression, and chronic pain  and Financial constraints related to medication cost  and Mental Health Concerns  take all medications exactly as prescribed and will call provider for medication related questions attend all scheduled medical appointments: 03-18-2021 at 1120 am demonstrate a decrease in Atrial Fibrillation, HTN, HLD, DMII, CKD Stage 4, Depression, and Chronic pain  exacerbations demonstrate improved adherence to prescribed treatment plan for Atrial Fibrillation, HTN, HLD, DMII, CKD Stage 4, Depression, and chronic pain  demonstrate improved health management independence verbalize basic understanding of Atrial Fibrillation, HTN, HLD, DMII, CKD Stage 4, Depression, and Chronic pain  disease process and self health management plan demonstrate understanding of rationale for each prescribed medication work with CM clinical social worker to help with building coping skills and handle stress over multiple chronic conditions  demonstrate ongoing self health care  management ability through collaboration with RN Care manager, provider, and care team.   Interventions: 1:1 collaboration with primary care provider regarding development and update of comprehensive plan of care as evidenced by provider attestation and co-signature Inter-disciplinary care team collaboration (see longitudinal plan of care)   A-fib:  (Status: Goal on track: YES.) Counseled on increased risk of stroke due to Afib and benefits of anticoagulation for stroke prevention; Reviewed importance of adherence to anticoagulant exactly as prescribed.  01-27-2021: the patient was approved for help with Eliquis until the end of the year. The patient is compliant with medications and is happy that she has received help for the Eliquis as she was in the donut hole. She states getting help with the medication has helped a whole lot.  Advised patient to discuss changes in AFIB, concerns or questions  with provider; Counseled on bleeding risk associated with Eliquis and importance of self-monitoring for signs/symptoms of bleeding; Counseled on avoidance of NSAIDs due to increased bleeding risk with anticoagulants; Counseled on importance of regular laboratory monitoring as prescribed; Counseled on seeking medical attention after a head injury or if there is blood in the urine/stool; Afib action plan reviewed;  Diabetes:  (Status: Goal on track: YES.) Lab Results  Component Value Date   HGBA1C 7.6 (H) 12/03/2020  Assessed patient's understanding of A1c goal: <7% Provided education to patient about basic DM disease process; Reviewed medications with patient and discussed importance of medication adherence. 01-27-2021: The patient states that she is taking the Jardiance and ozempic as directed. She has been approved for help with the Jardiance until the end of the year. 03-13-2021: The patient states that she is no longer taking the Ozempic, after finishing the samples the patient did not know that she was supposed to continue. Advised the patient that she had refills at [facility]. The patient states she is staking the Metformin and Jardiance but not the Ozempic. Advised the patient to call the pharmacy and inquire about the Ozempic. The patient also has an appointment in the office to see the pcp on 03-18-2021 and advised the patient to discuss this with the pcp. Will collaborate with the pcp and pharm D on plan of care for DM and the patient wanting to know if she is supposed to be taking all the medications for her DM.  Counseled on importance of regular  laboratory monitoring as prescribed. 01-27-2021: The patient likely will have new lab work in October. 03-13-2021: Review of most recent lab work and A1C. Education on the goal of A1C being less than 7.0. Discussed plans with patient for ongoing care management follow up and provided patient with direct contact information for care management team; Provided patient with written educational materials related to hypo and hyperglycemia and importance of correct treatment; Reviewed scheduled/upcoming provider appointments including: 03-18-2021 at 1120 am; Advised patient, providing education and rationale, to check cbg bid and record, calling pcp for findings outside established parameters. 01-27-2021: The patient states that her blood sugar this am was 189.  The patient states it has been going up and down. She has started taking the Jardiance again. Education provided that blood sugars should stabilize with taking Jardiance and Ozempic.  Education on continuing to check blood sugars and call for extreme changes in blood sugar readings. 03-13-2021: The patient states that her blood sugars this am was 189. The patient states that she knows if it gets down to 100 it is too low for her. Education on fasting blood sugar  goal of <130 and post prandial of <180.  The patient states she takes her blood sugars are various time and if she feels "funny" or different.  Education and support given.  Referral made to pharmacy team for assistance with obtaining financial assistance with Ozempic and other medications, patient spoke to pharmacist on 12-23-2020. 01-27-2021: Working with the pharmacy team for assistance with medications. 03-13-2021: Collaboration with the pharm D concerning assistance with medications compliance and treatment plan of DM ; Review of patient status, including review of consultants reports, relevant laboratory and other test results, and medications completed; Advised patient to discuss questions or  concern about her DM  with provider, the patient was upset that she "messed up" her Ozempic dose this am and it was a wasted dose. Reflective listening and support given. Education on how to take [place]. Her daughters came and assisted her and will assist again next week until she is comfortable giving the Ozempic to herself. The patient is a little concerned about the paperwork and feels the insurance will not pay for the Ozempic. Reviewed conversation she had with pharm D on 12-23-2020, per the notes the pharm D is working with the patient to help with financial constraints of medications, including Ozempic. 01-27-2021: the patient is doing well with the Ozempic and is now taking Jardiance again. The patient states she is having boils continue to come up in different places on her body, usually between her breast and buttocks. States she has one on her buttocks now. She has been putting cream from the dermatologist on it and that is helping. Has went to wound clinic before for an infected boil and was treated successfully. The patient wants to know what to do for reoccurring "boils". Advised the patient to follow previous recommendations from the provider and to discuss this also with the pcp on her visit in October. The patient states the cream does take the soreness out. Review of sx and sx of infection and call the office for new onset of fever, chills, or drainage consistent with infection. The patient states that she has not had any sx and sx of infection. Also advised the patient that regulation of blood sugars should also help with occurrence of boils.  Will continue to monitor for changes. 03-13-2021: The patient states she was not aware that she had refills for Ozempic at the pharmacy. Read the provider notes from July to the patient. The patient states she stopped taking the Ozempic when she ran out of the samples. Advised the patient to call the pharmacy and inquire about the refills for Ozempic. Also  advised the patient that the [facility] would collaborate with the pcp and pharm D about the recommendations for medication management of her DM. The patient is following up with the pcp next week. Will continue to monitor.   Hyperlipidemia:  (Status: Goal on track: YES.) Lab Results  Component Value Date   CHOL 152 12/03/2020   Lab Results  Component Value Date   HDL 43 12/03/2020   Lab Results  Component Value Date   LDLCALC 87 12/03/2020   Lab Results  Component Value Date   TRIG 123 12/03/2020  No results found for: CHOLHDL No results found for: LDLDIRECT  Medication review performed; medication list updated in electronic medical record. Takes Lipitor 40 mg for HLD control  Provider established cholesterol goals reviewed; Counseled on importance of regular laboratory monitoring as prescribed; Provided HLD educational materials; Reviewed role and benefits of statin for ASCVD risk  reduction; Discussed strategies to manage statin-induced myalgias; Reviewed importance of limiting foods high in cholesterol 01-27-2021: the patient endorses compliance with heart healthy/ADA diet  Hypertension: (Status: Goal on track: YES.) Last practice recorded BP readings:  BP Readings from Last 3 Encounters:  01/13/21 120/72  12/17/20 (!) 128/55  12/03/20 (!) 126/56  Most recent eGFR/CrCl:  Lab Results  Component Value Date   EGFR 34 (L) 12/03/2020    No components found for: CRCL  Evaluation of current treatment plan related to hypertension self management and patient's adherence to plan as established by provider. The patient states that sometimes her blood pressure goes lower but states recently her systolic has been 517 to 001 range and diastolic 50 to 60. Says that she does get light headed some times and dizzy but not recently. Discussed changing position slowly and monitoring for orthostatic hypotension. Explained what orthostatic hypotension was and to be safe when changing positions due to  the possibility of blood pressure dropping. 01-27-2021: The patient states she is doing well with management of her HTN. Saw cardiologist recently and pulmonary. Lisinopril discontinued and the patient was started on olmesartan 40 mg. The patient states her cough is better. The patient states the cardiologist may take her off of some of her medications at next visit. States she feels good. 03-13-2021: The patient had ask the LCSW to get the [facility] to call her concerning some questions she had about her blood pressures and it dropping. The patient states that sometimes her blood pressure is 106/55 and she feels like she is dizzy. She states she doesn't know if it is some of the medications she takes because the bottles say may cause dizziness or if it is vertigo which she has had before or her blood pressure dropping. Reviewed in detail about the possibility of orthostatic hypotension and the need to change position slowly because blood pressures could drop lower when changing position if moving too fast. Education to use precaution when going from a lying to a sitting, a sitting to a standing.  Also education on monitoring for sinus changes and possible vertigo. Reminded the patient to be safe and by being dizzy this put her at higher risk of falls. The patient states she is being careful and she does not want to fall.  Provided education to patient re: stroke prevention, s/s of heart attack and stroke; Reviewed medications with patient and discussed importance of compliance. 03-13-2021: States compliance with medications ; Discussed plans with patient for ongoing care management follow up and provided patient with direct contact information for care management team; Advised patient, providing education and rationale, to monitor blood pressure daily and record, calling PCP for findings outside established parameters;  Reviewed scheduled/upcoming provider appointments including: 03-18-2021 at 1120 am Provided  education on prescribed diet heart healthy/ADA diet. 03-13-2021: States she is eating well and is compliant with a heart healthy/ADA diet;  Discussed complications of poorly controlled blood pressure such as heart disease, stroke, circulatory complications, vision complications, kidney impairment, sexual dysfunction;    CKD4 (Status: Goal on track: YES.) Evaluation of current treatment plan related to CKD Stage 4 ,  self-management and patient's adherence to plan as established by provider. Discussed plans with patient for ongoing care management follow up and provided patient with direct contact information for care management team Evaluation of current treatment plan related to CKD4 and patient's adherence to plan as established by provider. 01-27-2021: The patient is doing well and denies any issues at this time  with her kidneys. States she is feeling much better and thankful her health is better after COVID and hospitalization. Will continue to monitor.  Advised patient to call the office for changes in condition, questions or concerns; Provided education to patient re: staying hydrated, eating healthy, pacing activity, keeping follow up with pcp and specialist; Reviewed medications with patient and discussed compliance. 01-27-2021: Is compliant with medications.; Reviewed scheduled/upcoming provider appointments including 03-18-2021 at 1120 am; Discussed plans with patient for ongoing care management follow up and provided patient with direct contact information for care management team;   Depression (Status: Goal on track: YES.) Evaluation of current treatment plan related to Depression, Mental Health Concerns  self-management and patient's adherence to plan as established by provider. Discussed plans with patient for ongoing care management follow up and provided patient with direct contact information for care management team Evaluation of current treatment plan related to depression  and  patient's adherence to plan as established by provider. 01-27-2021: The patient is doing well and just celebrated her 70st birthday. Her family took her out on Sunday and she had a good day on her actual birthday yesterday. The patient states she is doing much better and happy that her health is improving. Denies any issues with depression at this time. Will continue to monitor.  Advised patient to call the office for changes in mood, anxiety, or increased episodes of depression; Provided education to patient re: working with CCM team to effectively manage her depression; Reviewed medications with patient and discussed compliance ; Reviewed scheduled/upcoming provider appointments including: 03-18-2021 at 1120 am Social Work referral for assistance with coping skills and recommendations for effective management of depression. 01-27-2021: Actively working with the [facility] for ongoing support and education; Discussed plans with patient for ongoing care management follow up and provided patient with direct contact information for care management team; Screening for signs and symptoms of depression related to chronic disease state;  Assessed social determinant of health barriers;   Pain:  (Status: Goal on track: YES.) Pain assessment performed Medications reviewed Reviewed provider established plan for pain management. 01-27-2021: The patient is using Tylenol arthritis when she needs to for pain. States pain varies daily. Denies any pain today. Knows what to do for flare ups. States her butt cheek hurts sometimes and she thinks it is from "sciatica". Denies any acute distress. Will continue to monitor. Discussed importance of adherence to all scheduled medical appointments; Counseled on the importance of reporting any/all new or changed pain symptoms or management strategies to pain management provider; Advised patient to report to care team affect of pain on daily activities; Discussed use of relaxation  techniques and/or diversional activities to assist with pain reduction (distraction, imagery, relaxation, massage, acupressure, TENS, heat, and cold application; Reviewed with patient prescribed pharmacological and nonpharmacological pain relief strategies; Advised patient to discuss increased level or intensity of pain or uncontrolled pain  with provider;  Patient Goals/Self-Care Activities: Patient will self administer medications as prescribed Patient will attend all scheduled provider appointments Patient will call pharmacy for medication refills Patient will attend church or other social activities Patient will continue to perform ADL's independently Patient will continue to perform IADL's independently Patient will call provider office for new concerns or questions  Follow Up Plan: Telephone follow up appointment with care management team member scheduled for: 03-31-2021 at 1 pm      Plan:Telephone follow up appointment with care management team member scheduled for:  03-31-2021 at 1 pm  Noreene Larsson RN,  MSN, [facility] Mobile: [phone]

## 2021-03-13 NOTE — Patient Instructions (Signed)
Visit Information  PATIENT GOALS:  Goals Addressed             This Visit's Progress    RNCM: Manage My Medicine       Timeframe:  Long-Range Goal Priority:  High Start Date:      12-24-2020                       Expected End Date:       12-24-2021                Follow Up Date 03/31/2021    - call for medicine refill 2 or 3 days before it runs out - call if I am sick and can't take my medicine - keep a list of all the medicines I take; vitamins and herbals too - learn to read medicine labels - use a pillbox to sort medicine - use an alarm clock or phone to remind me to take my medicine    Why is this important?   These steps will help you keep on track with your medicines.   Notes: 12-24-2020: The patient spoke to the pharm D on 12-23-2020, they are working with her for assistance with some of her medications. The patient was upset with herself this am because she was trying to give herself her first dose of Ozempic and she forgot to take the cap off and wasted it in the tube. Her daughters came over and helped her with the dose. She said I watched carefully last week when Jolene gave it to me and I thought I was doing it right. Reflective listening and encouragement given to the patient. Explained this happens to all of [place] and let her talk her feelings out. Will continue to work with patient for education and support. 01-27-2021: the patient is happy that she was approved to get her jardiance and her Eliquis for the rest of the year as she was in the donut hole. She says this has helped a lot and she is so appreciative. She continues to take her Ozempic and is doing well with administration of Ozempic at this time. 03-13-2021: The patient has not been taking the Ozempic and did not know she had refills at the pharmacy. Education on having refills at the pharmacy. She is taking Jardiance and Metformin but after finishing the samples of Ozempic she has not taken any more of this.  Education and  support given. Will collaborate with the pcp and pharm D concerning medication questions and reconciliation.      RNCM: Track and Manage My Symptoms-Depression       Timeframe:  Long-Range Goal Priority:  High Start Date:     12-24-2020                        Expected End Date:        12-24-2021               Follow Up Date 03/31/2021    - avoid negative self-talk - develop a personal safety plan - develop a plan to deal with triggers like holidays, anniversaries - exercise at least 2 to 3 times per week - have a plan for how to handle bad days - journal feelings and what helps to feel better or worse - spend time or talk with others at least 2 to 3 times per week - spend time or talk with others every day - watch  for early signs of feeling worse    Why is this important?   Keeping track of your progress will help your treatment team find the right mix of medicine and therapy for you.  Write in your journal every day.  Day-to-day changes in depression symptoms are normal. It may be more helpful to check your progress at the end of each week instead of every day.     Notes: 12-24-2020: The patient has a good family support system. She has had a hard year and wants to improve her health and well being. She states she has to work through things and she is thankful for the help and support she is getting. 01-27-2021: The patient is feeling much better and doing much better since last outreach. Working with the pcp, CCM team and specialist to get her health and wellness on track. She is happy about the help she has received with getting her jardiance and Eliquis. The patient turned 39 yesterday and she states it was a good day and her family took her out to eat on Sunday for her birthday. She is appreciative of the CCM team and all help that has been provided her. 03-13-2021: The patient is doing well and denies any issues with her depression. She states that she is having some dizziness and does not  know what is causing this. She denies any falls. Education and support given. Will continue to monitor for changes.         The patient verbalized understanding of instructions, educational materials, and care plan provided today and declined offer to receive copy of patient instructions, educational materials, and care plan.   Telephone follow up appointment with care management team member scheduled for: 03-31-2021   Noreene Larsson RN, MSN, [facility] Mobile: [phone]

## 2021-03-14 ENCOUNTER — Encounter: Payer: Self-pay | Admitting: Nurse Practitioner

## 2021-03-14 DIAGNOSIS — E210 Primary hyperparathyroidism: Secondary | ICD-10-CM | POA: Insufficient documentation

## 2021-03-17 ENCOUNTER — Ambulatory Visit: Payer: Medicare HMO | Admitting: Nurse Practitioner

## 2021-03-18 ENCOUNTER — Ambulatory Visit: Payer: Medicare HMO | Admitting: Nurse Practitioner

## 2021-03-21 DIAGNOSIS — G4733 Obstructive sleep apnea (adult) (pediatric): Secondary | ICD-10-CM | POA: Diagnosis not present

## 2021-03-23 ENCOUNTER — Ambulatory Visit (INDEPENDENT_AMBULATORY_CARE_PROVIDER_SITE_OTHER): Payer: Medicare HMO | Admitting: Nurse Practitioner

## 2021-03-23 ENCOUNTER — Other Ambulatory Visit: Payer: Self-pay

## 2021-03-23 ENCOUNTER — Encounter: Payer: Self-pay | Admitting: Nurse Practitioner

## 2021-03-23 DIAGNOSIS — E210 Primary hyperparathyroidism: Secondary | ICD-10-CM

## 2021-03-23 DIAGNOSIS — K219 Gastro-esophageal reflux disease without esophagitis: Secondary | ICD-10-CM

## 2021-03-23 DIAGNOSIS — R0609 Other forms of dyspnea: Secondary | ICD-10-CM | POA: Diagnosis not present

## 2021-03-23 DIAGNOSIS — Z780 Asymptomatic menopausal state: Secondary | ICD-10-CM

## 2021-03-23 DIAGNOSIS — F321 Major depressive disorder, single episode, moderate: Secondary | ICD-10-CM | POA: Diagnosis not present

## 2021-03-23 DIAGNOSIS — E1169 Type 2 diabetes mellitus with other specified complication: Secondary | ICD-10-CM | POA: Diagnosis not present

## 2021-03-23 DIAGNOSIS — Z1159 Encounter for screening for other viral diseases: Secondary | ICD-10-CM | POA: Diagnosis not present

## 2021-03-23 DIAGNOSIS — I152 Hypertension secondary to endocrine disorders: Secondary | ICD-10-CM | POA: Diagnosis not present

## 2021-03-23 DIAGNOSIS — E785 Hyperlipidemia, unspecified: Secondary | ICD-10-CM

## 2021-03-23 DIAGNOSIS — N184 Chronic kidney disease, stage 4 (severe): Secondary | ICD-10-CM

## 2021-03-23 DIAGNOSIS — Z23 Encounter for immunization: Secondary | ICD-10-CM

## 2021-03-23 DIAGNOSIS — I480 Paroxysmal atrial fibrillation: Secondary | ICD-10-CM

## 2021-03-23 DIAGNOSIS — E1159 Type 2 diabetes mellitus with other circulatory complications: Secondary | ICD-10-CM

## 2021-03-23 DIAGNOSIS — G4733 Obstructive sleep apnea (adult) (pediatric): Secondary | ICD-10-CM

## 2021-03-23 DIAGNOSIS — E1122 Type 2 diabetes mellitus with diabetic chronic kidney disease: Secondary | ICD-10-CM | POA: Diagnosis not present

## 2021-03-23 LAB — BAYER DCA HB A1C WAIVED: HB A1C (BAYER DCA - WAIVED): 7.4 % — ABNORMAL HIGH (ref 4.8–5.6)

## 2021-03-23 MED ORDER — BISOPROLOL FUMARATE 10 MG PO TABS: 10.0000 mg | ORAL_TABLET | Freq: Two times a day (BID) | ORAL | 4 refills | Status: DC

## 2021-03-23 MED ORDER — SPIRONOLACTONE 25 MG PO TABS: 25.0000 mg | ORAL_TABLET | Freq: Every day | ORAL | 4 refills | Status: DC

## 2021-03-23 MED ORDER — EMPAGLIFLOZIN 10 MG PO TABS: 10.0000 mg | ORAL_TABLET | Freq: Every day | ORAL | 4 refills | Status: DC

## 2021-03-23 MED ORDER — OLMESARTAN MEDOXOMIL 20 MG PO TABS: 20.0000 mg | ORAL_TABLET | Freq: Every day | ORAL | 4 refills | Status: DC

## 2021-03-23 MED ORDER — MONTELUKAST SODIUM 10 MG PO TABS: 10.0000 mg | ORAL_TABLET | Freq: Every day | ORAL | 4 refills | Status: DC

## 2021-03-23 NOTE — Assessment & Plan Note (Signed)
Chronic, ongoing with A1c 7.4% today, downward trend.   - Continue renal dosed Metformin and Jardiance -- Jardiance obtained via assistance with CCM - Maintain off Glimepiride due to her age and risk for hypoglycemia. - If needed in future start Ozempic 0.25 MG weekly and increase in 4 weeks to 0.5 MG, see if CCM can assist. No family or personal h/o thyroid cancer. - Check BS TID and focus on diabetic diet.    - Return in January for A1c -- but alert provider prior to if elevations in BS >300 or <70.

## 2021-03-23 NOTE — Assessment & Plan Note (Signed)
Chronic, ongoing.  Continue Omeprazole as needed and will check Mag level today. 

## 2021-03-23 NOTE — Assessment & Plan Note (Signed)
Chronic, ongoing, followed by cardiology.  Continue Eliquis and Metoprolol + collaboration with cardiology team.  Rate controlled on exam today.  Continue to collaborate with CCM team. ?

## 2021-03-23 NOTE — Assessment & Plan Note (Signed)
Chronic, ongoing with BP well below goal today on exam with report occasional dizziness.  At this time will reduce Olmesartan to 20 MG daily, discussed with patient and daughter + per Dr. Melvyn Novas with pulmonary, since ongoing SOB present will stop Metoprolol and transition to Bisoprolol for rate control and BP -- discussed with patient and daughter.  At this time continue remainder of current regimen and collaboration with cardiology -- will discuss with them possibility further reduction in future.  Recommend she monitor BP at least a few mornings a week at home and document.  DASH diet at home.  Labs: CMP and CBC today.  Return in 3 months.

## 2021-03-23 NOTE — Patient Instructions (Addendum)
[facility] at [facility]  Address: [address] [place], [place], [place] [address]  Phone: [phone]   Diabetes Mellitus and Nutrition, Adult When you have diabetes, or diabetes mellitus, it is very important to have healthy eating habits because your blood sugar (glucose) levels are greatly affected by what you eat and drink. Eating healthy foods in the right amounts, at about the same times every day, can help you: Control your blood glucose. Lower your risk of heart disease. Improve your blood pressure. Reach or maintain a healthy weight. What can affect my meal plan? Every person with diabetes is different, and each person has different needs for a meal plan. Your health care provider may recommend that you work with a dietitian to make a meal plan that is best for you. Your meal plan may vary depending on factors such as: The calories you need. The medicines you take. Your weight. Your blood glucose, blood pressure, and cholesterol levels. Your activity level. Other health conditions you have, such as heart or kidney disease. How do carbohydrates affect me? Carbohydrates, also called carbs, affect your blood glucose level more than any other type of food. Eating carbs naturally raises the amount of glucose in your blood. Carb counting is a method for keeping track of how many carbs you eat. Counting carbs is important to keep your blood glucose at a healthy level, especially if you use insulin or take certain oral diabetes medicines. It is important to know how many carbs you can safely have in each meal. This is different for every person. Your dietitian can help you calculate how many carbs you should have at each meal and for each snack. How does alcohol affect me? Alcohol can cause a sudden decrease in blood glucose (hypoglycemia), especially if you use insulin or take certain oral diabetes medicines. Hypoglycemia can be a life-threatening condition. Symptoms of  hypoglycemia, such as sleepiness, dizziness, and confusion, are similar to symptoms of having too much alcohol. Do not drink alcohol if: Your health care provider tells you not to drink. You are pregnant, may be pregnant, or are planning to become pregnant. If you drink alcohol: Do not drink on an empty stomach. Limit how much you use to: 0-1 drink a day for women. 0-2 drinks a day for men. Be aware of how much alcohol is in your drink. In the [place] one drink equals one 12 oz bottle of beer (355 mL), one 5 oz glass of wine (148 mL), or one 1 oz glass of hard liquor (44 mL). Keep yourself hydrated with water, diet soda, or unsweetened iced tea. Keep in mind that regular soda, juice, and other mixers may contain a lot of sugar and must be counted as carbs. What are tips for following this plan? Reading food labels Start by checking the serving size on the "Nutrition Facts" label of packaged foods and drinks. The amount of calories, carbs, fats, and other nutrients listed on the label is based on one serving of the item. Many items contain more than one serving per package. Check the total grams (g) of carbs in one serving. You can calculate the number of servings of carbs in one serving by dividing the total carbs by 15. For example, if a food has 30 g of total carbs per serving, it would be equal to 2 servings of carbs. Check the number of grams (g) of saturated fats and trans fats in one serving. Choose foods that have a low  amount or none of these fats. Check the number of milligrams (mg) of salt (sodium) in one serving. Most people should limit total sodium intake to less than 2,300 mg per day. Always check the nutrition information of foods labeled as "low-fat" or "nonfat." These foods may be higher in added sugar or refined carbs and should be avoided. Talk to your dietitian to identify your daily goals for nutrients listed on the label. Shopping Avoid buying canned, pre-made, or processed  foods. These foods tend to be high in fat, sodium, and added sugar. Shop around the outside edge of the grocery store. This is where you will most often find fresh fruits and vegetables, bulk grains, fresh meats, and fresh dairy. Cooking Use low-heat cooking methods, such as baking, instead of high-heat cooking methods like deep frying. Cook using healthy oils, such as olive, canola, or sunflower oil. Avoid cooking with butter, cream, or high-fat meats. Meal planning Eat meals and snacks regularly, preferably at the same times every day. Avoid going long periods of time without eating. Eat foods that are high in fiber, such as fresh fruits, vegetables, beans, and whole grains. Talk with your dietitian about how many servings of carbs you can eat at each meal. Eat 4-6 oz (112-168 g) of lean protein each day, such as lean meat, chicken, fish, eggs, or tofu. One ounce (oz) of lean protein is equal to: 1 oz (28 g) of meat, chicken, or fish. 1 egg.  cup (62 g) of tofu. Eat some foods each day that contain healthy fats, such as avocado, nuts, seeds, and fish. What foods should I eat? Fruits Berries. Apples. Oranges. Peaches. Apricots. Plums. Grapes. Mango. Papaya. Pomegranate. Kiwi. Cherries. Vegetables Lettuce. Spinach. Leafy greens, including kale, chard, collard greens, and mustard greens. Beets. Cauliflower. Cabbage. Broccoli. Carrots. Green beans. Tomatoes. Peppers. Onions. Cucumbers. Brussels sprouts. Grains Whole grains, such as whole-wheat or whole-grain bread, crackers, tortillas, cereal, and pasta. Unsweetened oatmeal. Quinoa. Brown or wild rice. Meats and other proteins Seafood. Poultry without skin. Lean cuts of poultry and beef. Tofu. Nuts. Seeds. Dairy Low-fat or fat-free dairy products such as milk, yogurt, and cheese. The items listed above may not be a complete list of foods and beverages you can eat. Contact a dietitian for more information. What foods should I  avoid? Fruits Fruits canned with syrup. Vegetables Canned vegetables. Frozen vegetables with butter or cream sauce. Grains Refined white flour and flour products such as bread, pasta, snack foods, and cereals. Avoid all processed foods. Meats and other proteins Fatty cuts of meat. Poultry with skin. Breaded or fried meats. Processed meat. Avoid saturated fats. Dairy Full-fat yogurt, cheese, or milk. Beverages Sweetened drinks, such as soda or iced tea. The items listed above may not be a complete list of foods and beverages you should avoid. Contact a dietitian for more information. Questions to ask a health care provider Do I need to meet with a diabetes educator? Do I need to meet with a dietitian? What number can I call if I have questions? When are the best times to check my blood glucose? Where to find more information: American Diabetes Association: diabetes.org Academy of Nutrition and Dietetics: www.eatright.[employer] of Diabetes and Digestive and Kidney Diseases: [employer] Association of Diabetes Care and Education Specialists: www.diabeteseducator.org Summary It is important to have healthy eating habits because your blood sugar (glucose) levels are greatly affected by what you eat and drink. A healthy meal plan will help you control your blood glucose and  maintain a healthy lifestyle. Your health care provider may recommend that you work with a dietitian to make a meal plan that is best for you. Keep in mind that carbohydrates (carbs) and alcohol have immediate effects on your blood glucose levels. It is important to count carbs and to use alcohol carefully. This information is not intended to replace advice given to you by your health care provider. Make sure you discuss any questions you have with your health care provider. Document Revised: 04/24/2019 Document Reviewed: 04/24/2019 Elsevier Patient Education  2021 [employer].

## 2021-03-23 NOTE — Assessment & Plan Note (Signed)
Chronic, ongoing.  Continue collaboration with nephrology and current medication regimen. °

## 2021-03-23 NOTE — Assessment & Plan Note (Signed)
Recheck Mag level today.

## 2021-03-23 NOTE — Assessment & Plan Note (Signed)
Recent change from ACe to ARB with pulmonary, continues to report ongoing SOB.  At this time will further change medications per their recommendations -- Stop Metoprolol and start Bisoprolol for HR rate control.  Discussed at length with patient and daughter.  Continue to collaborate with cardiology and pulmonary.  Would benefit from modest weight loss for overall health and assist with SOB.

## 2021-03-23 NOTE — Assessment & Plan Note (Signed)
Chronic, ongoing.  Continue 100% adherence to CPAP use at night. 

## 2021-03-23 NOTE — Assessment & Plan Note (Signed)
Chronic, ongoing.  Continue current medication regimen and adjust as needed.  Lipid panel up to date. 

## 2021-03-23 NOTE — Progress Notes (Signed)
BP (!) 108/52   Pulse 61   Temp 98.7 F (37.1 C) (Oral)   Wt 232 lb 9.6 oz (105.5 kg)   SpO2 99%   BMI 43.95 kg/m    Subjective:    Patient ID: Cindy [name], female    DOB: 11-03-1950, 70 y.o.   MRN: [id number]  HPI: Cindy [name] is a 70 y.o. female  Chief Complaint  Patient presents with   Hyperlipidemia   Hypertension   Diabetes    Patient states she had a Diabetic Eye Exam at beginning at year.    Mood   Medication Refill    Patient is requesting refills on her medications.    DIABETES A1c in July was 7.6%.  Taking Metformin and Jardiance only at this time -- tried to add Ozempic in past but cost was issue.  Working with CCM team on cost assist with Jardiance and Eliquis. Hypoglycemic episodes:no Polydipsia/polyuria: no Visual disturbance: no Chest pain: no Paresthesias: no Glucose Monitoring: yes             Accucheck frequency: Daily             Fasting glucose: 118 to 189             Post prandial:             Evening:             Before meals: Taking Insulin?: no             Long acting insulin:             Short acting insulin: Blood Pressure Monitoring: a few times a week Retinal Examination: Up to Date -- [facility] Foot Exam: Up to Date  Diabetic Education: Completed in past Pneumovax: Given Today Influenza: Not up to Date - refuses Aspirin: no    HYPERTENSION / HYPERLIPIDEMIA/A-FIB Followed by cardiology, saw last 01/14/21. Continues on Eliquis, Atorvastatin, Diltiazem, Lasix, Olmesartan, Metoprolol, Spironolactone, and Minoxidil.  Uses CPAP every night.   Has recent echo with EF 60%.  Aortic atherosclerosis noted on 07/15/20 imaging.  She had initial visit with pulmonary, Dr. Melvyn Novas, 01/13/21 due to ongoing SOB post Covid in February 2022 == they stopped her ACE and changed her over to ARB (Olmesartan) + recommended if ongoing issues then next step would be to change to Bisoprolol 10 MG BID vs Metoprolol.  She is to return in 6 weeks.  She  continues to have shortness of breath, even with medication change.  Denies any coughing.  SOB continues with short distances, uses Albuterol at times which offers benefit. Satisfied with current treatment? yes Duration of hypertension: chronic BP monitoring frequency: a few times a week BP range: 100-130/60-70 BP medication side effects: no Duration of hyperlipidemia: chronic Cholesterol medication side effects: no Cholesterol supplements: none Medication compliance: good compliance Aspirin: no Recent stressors: no Recurrent headaches: no Visual changes: no Palpitations: no Dyspnea: worsening after Covid Chest pain: no Lower extremity edema: no Dizzy/lightheaded: with position changes  GERD Continues on Omeprazole.   GERD control status: stable Satisfied with current treatment? yes Heartburn frequency:  occasional Medication side effects: no  Medication compliance: stable Dysphagia: no Odynophagia:  no Hematemesis: no Blood in stool: no EGD: no    CHRONIC KIDNEY DISEASE Labs on 12/18/20 with nephrology = CRT 1.71, GFR 32, PTH 108. CKD status: controlled Medications renally dose:  discussed with patient Previous renal evaluation: no Pneumovax:  Given today Influenza Vaccine:  Not  up to Date --refuses  DEPRESSION Continues on Duloxetine daily. Mood status: stable Satisfied with current treatment?: yes Symptom severity: moderate  Duration of current treatment : chronic Side effects: no Medication compliance: good compliance Psychotherapy/counseling: none Depressed mood: yes Anxious mood: no Anhedonia: no Significant weight loss or gain: no Insomnia: none Fatigue: no Feelings of worthlessness or guilt: no Impaired concentration/indecisiveness: no Suicidal ideations: no Hopelessness: no Crying spells: no Depression screen [facility] 2/9 03/23/2021 01/19/2021 12/03/2020 12/03/2020  Decreased Interest 2 0 1 1  Down, Depressed, Hopeless 2 0 1 1  PHQ - 2 Score 4 0 2 2   Altered sleeping 2 - 2 2  Tired, decreased energy 2 - 1 2  Change in appetite 2 - 2 -  Feeling bad or failure about yourself  2 - 0 0  Trouble concentrating 2 - 0 0  Moving slowly or fidgety/restless 0 - 0 0  Suicidal thoughts 0 - 0 0  PHQ-9 Score 14 - 7 6  Difficult doing work/chores Somewhat difficult - Somewhat difficult Somewhat difficult     Relevant past medical, surgical, family and social history reviewed and updated as indicated. Interim medical history since our last visit reviewed. Allergies and medications reviewed and updated.  Review of Systems  Constitutional:  Positive for fatigue. Negative for activity change, appetite change, diaphoresis and fever.  Respiratory:  Positive for shortness of breath. Negative for cough, chest tightness and wheezing.   Cardiovascular:  Negative for chest pain, palpitations and leg swelling.  Gastrointestinal: Negative.   Endocrine: Negative for cold intolerance, heat intolerance, polydipsia, polyphagia and polyuria.  Neurological: Negative.   Psychiatric/Behavioral: Negative.     Per HPI unless specifically indicated above     Objective:    BP (!) 108/52   Pulse 61   Temp 98.7 F (37.1 C) (Oral)   Wt 232 lb 9.6 oz (105.5 kg)   SpO2 99%   BMI 43.95 kg/m   Wt Readings from Last 3 Encounters:  03/23/21 232 lb 9.6 oz (105.5 kg)  01/19/21 242 lb (109.8 kg)  01/13/21 236 lb 9.6 oz (107.3 kg)    Physical Exam Vitals and nursing note reviewed.  Constitutional:      General: She is awake. She is not in acute distress.    Appearance: She is well-developed and well-groomed. She is morbidly obese. She is not ill-appearing or toxic-appearing.  HENT:     Head: Normocephalic.     Right Ear: Hearing normal.     Left Ear: Hearing normal.  Eyes:     General: Lids are normal.        Right eye: No discharge.        Left eye: No discharge.     Conjunctiva/sclera: Conjunctivae normal.     Pupils: Pupils are equal, round, and reactive  to light.  Neck:     Thyroid: No thyromegaly.     Vascular: No carotid bruit.  Cardiovascular:     Rate and Rhythm: Normal rate and regular rhythm.     Heart sounds: Murmur heard.  Systolic murmur is present with a grade of 2/6.    No gallop.  Pulmonary:     Effort: Pulmonary effort is normal. No accessory muscle usage or respiratory distress.     Breath sounds: No decreased breath sounds or wheezing.  Abdominal:     General: Bowel sounds are normal. There is no distension.     Palpations: Abdomen is soft.  Musculoskeletal:     Cervical back:  Normal range of motion and neck supple.     Right lower leg: No edema.     Left lower leg: No edema.  Lymphadenopathy:     Cervical: No cervical adenopathy.  Skin:    General: Skin is warm and dry.  Neurological:     Mental Status: She is alert and oriented to person, place, and time.     Deep Tendon Reflexes: Reflexes are normal and symmetric.     Reflex Scores:      Brachioradialis reflexes are 2+ on the right side and 2+ on the left side.      Patellar reflexes are 2+ on the right side and 2+ on the left side. Psychiatric:        Attention and Perception: Attention normal.        Mood and Affect: Mood normal.        Speech: Speech normal.        Behavior: Behavior normal. Behavior is cooperative.        Thought Content: Thought content normal.    Results for orders placed or performed in visit on 12/03/20  HgB A1c  Result Value Ref Range   Hgb A1c MFr Bld 7.6 (H) 4.8 - 5.6 %   Est. average glucose Bld gHb Est-mCnc 171 mg/dL  Comprehensive metabolic panel  Result Value Ref Range   Glucose 80 65 - 99 mg/dL   BUN 32 (H) 8 - 27 mg/dL   Creatinine, Ser 1.61 (H) 0.57 - 1.00 mg/dL   eGFR 34 (L) >59 mL/min/1.73   BUN/Creatinine Ratio 20 12 - 28   Sodium 140 134 - 144 mmol/L   Potassium 4.9 3.5 - 5.2 mmol/L   Chloride 99 96 - 106 mmol/L   CO2 23 20 - 29 mmol/L   Calcium 10.0 8.7 - 10.3 mg/dL   Total Protein 7.4 6.0 - 8.5 g/dL    Albumin 4.7 3.8 - 4.8 g/dL   Globulin, Total 2.7 1.5 - 4.5 g/dL   Albumin/Globulin Ratio 1.7 1.2 - 2.2   Bilirubin Total 0.3 0.0 - 1.2 mg/dL   Alkaline Phosphatase 76 44 - 121 IU/L   AST 17 0 - 40 IU/L   ALT 16 0 - 32 IU/L  Lipid Panel w/o Chol/HDL Ratio  Result Value Ref Range   Cholesterol, Total 152 100 - 199 mg/dL   Triglycerides 123 0 - 149 mg/dL   HDL 43 >39 mg/dL   VLDL Cholesterol Cal 22 5 - 40 mg/dL   LDL Chol Calc (NIH) 87 0 - 99 mg/dL  TSH  Result Value Ref Range   TSH 1.400 0.450 - 4.500 uIU/mL  Vitamin B12  Result Value Ref Range   Vitamin B-12 1,128 232 - 1,245 pg/mL  CBC with Differential/Platelet  Result Value Ref Range   WBC 5.8 3.4 - 10.8 x10E3/uL   RBC 3.70 (L) 3.77 - 5.28 x10E6/uL   Hemoglobin 10.3 (L) 11.1 - 15.9 g/dL   Hematocrit 32.2 (L) 34.0 - 46.6 %   MCV 87 79 - 97 fL   MCH 27.8 26.6 - 33.0 pg   MCHC 32.0 31.5 - 35.7 g/dL   RDW 13.7 11.7 - 15.4 %   Platelets 314 150 - 450 x10E3/uL   Neutrophils 64 Not Estab. %   Lymphs 22 Not Estab. %   Monocytes 9 Not Estab. %   Eos 3 Not Estab. %   Basos 1 Not Estab. %   Neutrophils Absolute 3.8 1.4 - 7.0 x10E3/uL  Lymphocytes Absolute 1.3 0.7 - 3.1 x10E3/uL   Monocytes Absolute 0.5 0.1 - 0.9 x10E3/uL   EOS (ABSOLUTE) 0.2 0.0 - 0.4 x10E3/uL   Basophils Absolute 0.0 0.0 - 0.2 x10E3/uL   Immature Granulocytes 1 Not Estab. %   Immature Grans (Abs) 0.1 0.0 - 0.1 x10E3/uL  Iron, TIBC and Ferritin Panel  Result Value Ref Range   Total Iron Binding Capacity 379 250 - 450 ug/dL   UIBC 320 118 - 369 ug/dL   Iron 59 27 - 139 ug/dL   Iron Saturation 16 15 - 55 %   Ferritin 63 15 - 150 ng/mL      Assessment & Plan:   Problem List Items Addressed This Visit       Cardiovascular and Mediastinum   Hypertension associated with diabetes (HCC)    Chronic, ongoing with BP well below goal today on exam with report occasional dizziness.  At this time will reduce Olmesartan to 20 MG daily, discussed with patient and  daughter + per Dr. Melvyn Novas with pulmonary, since ongoing SOB present will stop Metoprolol and transition to Bisoprolol for rate control and BP -- discussed with patient and daughter.  At this time continue remainder of current regimen and collaboration with cardiology -- will discuss with them possibility further reduction in future.  Recommend she monitor BP at least a few mornings a week at home and document.  DASH diet at home.  Labs: CMP and CBC today.  Return in 3 months.       Relevant Medications   bisoprolol (ZEBETA) 10 MG tablet   empagliflozin (JARDIANCE) 10 MG TABS tablet   olmesartan (BENICAR) 20 MG tablet   spironolactone (ALDACTONE) 25 MG tablet   Other Relevant Orders   Bayer DCA Hb A1c Waived   A-fib (HCC)    Chronic, ongoing, followed by cardiology.  Continue Eliquis and Metoprolol + collaboration with cardiology team.  Rate controlled on exam today.  Continue to collaborate with CCM team.      Relevant Medications   bisoprolol (ZEBETA) 10 MG tablet   olmesartan (BENICAR) 20 MG tablet   spironolactone (ALDACTONE) 25 MG tablet     Respiratory   OSA (obstructive sleep apnea)    Chronic, ongoing.  Continue 100% adherence to CPAP use at night.        Digestive   GERD (gastroesophageal reflux disease)    Chronic, ongoing.  Continue Omeprazole as needed and will check Mag level today.      Relevant Orders   Magnesium     Endocrine   Type 2 diabetes mellitus with morbid obesity ([place]) - Primary    Chronic, ongoing with A1c 7.4% today, downward trend.   - Continue renal dosed Metformin and Jardiance -- Jardiance obtained via assistance with CCM - Maintain off Glimepiride due to her age and risk for hypoglycemia. - If needed in future start Ozempic 0.25 MG weekly and increase in 4 weeks to 0.5 MG, see if CCM can assist. No family or personal h/o thyroid cancer. - Check BS TID and focus on diabetic diet.    - Return in January for A1c -- but alert provider prior to if  elevations in BS >300 or <70.        Relevant Medications   empagliflozin (JARDIANCE) 10 MG TABS tablet   olmesartan (BENICAR) 20 MG tablet   Other Relevant Orders   Bayer DCA Hb A1c Waived   Hyperlipidemia associated with type 2 diabetes mellitus ([place])  Chronic, ongoing.  Continue current medication regimen and adjust as needed.  Lipid panel up to date.       Relevant Medications   bisoprolol (ZEBETA) 10 MG tablet   empagliflozin (JARDIANCE) 10 MG TABS tablet   olmesartan (BENICAR) 20 MG tablet   spironolactone (ALDACTONE) 25 MG tablet   Other Relevant Orders   Bayer DCA Hb A1c Waived   Comprehensive metabolic panel   CKD stage 4 due to type 2 diabetes mellitus (HCC)    Ongoing, chronic.  At this time reduce and renal dose medications as needed + continue collaboration with nephrology.  CMP today.  Avoid NSAIDs.      Relevant Medications   empagliflozin (JARDIANCE) 10 MG TABS tablet   olmesartan (BENICAR) 20 MG tablet   Other Relevant Orders   CBC with Differential/Platelet   Comprehensive metabolic panel   Primary hyperparathyroidism (HCC)    Chronic, ongoing.  Continue collaboration with nephrology and current medication regimen.        Other   Depression, major, single episode, moderate (HCC)    Chronic, ongoing.  Denies SI/HI.  Continue Duloxetine at this time, which also offers benefit to chronic pain.  Refills as needed.  Will adjust dosing or add medication as needed.  Return in 3 months.      Hypomagnesemia    Recheck Mag level today.      Morbid obesity (HCC)    BMI 43.95 with T2DM, HTN, CKD.  Recommended eating smaller high protein, low fat meals more frequently and exercising 30 mins a day 5 times a week with a goal of 10-15lb weight loss in the next 3 months. Patient voiced their understanding and motivation to adhere to these recommendations.       Relevant Medications   empagliflozin (JARDIANCE) 10 MG TABS tablet   DOE (dyspnea on exertion)     Recent change from ACe to ARB with pulmonary, continues to report ongoing SOB.  At this time will further change medications per their recommendations -- Stop Metoprolol and start Bisoprolol for HR rate control.  Discussed at length with patient and daughter.  Continue to collaborate with cardiology and pulmonary.  Would benefit from modest weight loss for overall health and assist with SOB.      Other Visit Diagnoses     Postmenopausal estrogen deficiency       DEXA scan ordered and discussed with patient.   Relevant Orders   DG Bone Density   Need for hepatitis C screening test       Hep C screening on labs today per guidelines -- discussed with patient.   Relevant Orders   Hepatitis C antibody   Pneumococcal vaccination given       PCV13 provided today and discussed with patient.   Relevant Orders   Pneumococcal conjugate vaccine 13-valent (Completed)        Follow up plan: Return in about 3 months (around 06/23/2021) for T2DM, HTN/HLD, A-FIB, SOB, CKD.

## 2021-03-23 NOTE — Assessment & Plan Note (Signed)
Chronic, ongoing.  Denies SI/HI.  Continue Duloxetine at this time, which also offers benefit to chronic pain.  Refills as needed.  Will adjust dosing or add medication as needed.  Return in 3 months.

## 2021-03-23 NOTE — Assessment & Plan Note (Signed)
Ongoing, chronic.  At this time reduce and renal dose medications as needed + continue collaboration with nephrology.  CMP today.  Avoid NSAIDs.

## 2021-03-23 NOTE — Assessment & Plan Note (Signed)
BMI 43.95 with T2DM, HTN, CKD.  Recommended eating smaller high protein, low fat meals more frequently and exercising 30 mins a day 5 times a week with a goal of 10-15lb weight loss in the next 3 months. Patient voiced their understanding and motivation to adhere to these recommendations.

## 2021-03-24 LAB — CBC WITH DIFFERENTIAL/PLATELET
Basophils Absolute: 0 10*3/uL (ref 0.0–0.2)
Basos: 1 %
EOS (ABSOLUTE): 0.1 10*3/uL (ref 0.0–0.4)
Eos: 2 %
Hematocrit: 32.2 % — ABNORMAL LOW (ref 34.0–46.6)
Hemoglobin: 10.3 g/dL — ABNORMAL LOW (ref 11.1–15.9)
Immature Grans (Abs): 0 10*3/uL (ref 0.0–0.1)
Immature Granulocytes: 1 %
Lymphocytes Absolute: 1.5 10*3/uL (ref 0.7–3.1)
Lymphs: 27 %
MCH: 28.3 pg (ref 26.6–33.0)
MCHC: 32 g/dL (ref 31.5–35.7)
MCV: 89 fL (ref 79–97)
Monocytes Absolute: 0.4 10*3/uL (ref 0.1–0.9)
Monocytes: 7 %
Neutrophils Absolute: 3.5 10*3/uL (ref 1.4–7.0)
Neutrophils: 62 %
Platelets: 263 10*3/uL (ref 150–450)
RBC: 3.64 x10E6/uL — ABNORMAL LOW (ref 3.77–5.28)
RDW: 13.6 % (ref 11.7–15.4)
WBC: 5.6 10*3/uL (ref 3.4–10.8)

## 2021-03-24 LAB — COMPREHENSIVE METABOLIC PANEL
ALT: 14 IU/L (ref 0–32)
AST: 16 IU/L (ref 0–40)
Albumin/Globulin Ratio: 1.6 (ref 1.2–2.2)
Albumin: 4.4 g/dL (ref 3.7–4.7)
Alkaline Phosphatase: 93 IU/L (ref 44–121)
BUN/Creatinine Ratio: 14 (ref 12–28)
BUN: 23 mg/dL (ref 8–27)
Bilirubin Total: 0.3 mg/dL (ref 0.0–1.2)
CO2: 22 mmol/L (ref 20–29)
Calcium: 9.3 mg/dL (ref 8.7–10.3)
Chloride: 101 mmol/L (ref 96–106)
Creatinine, Ser: 1.7 mg/dL — ABNORMAL HIGH (ref 0.57–1.00)
Globulin, Total: 2.8 g/dL (ref 1.5–4.5)
Glucose: 187 mg/dL — ABNORMAL HIGH (ref 70–99)
Potassium: 4.9 mmol/L (ref 3.5–5.2)
Sodium: 138 mmol/L (ref 134–144)
Total Protein: 7.2 g/dL (ref 6.0–8.5)
eGFR: 32 mL/min/{1.73_m2} — ABNORMAL LOW (ref >59)

## 2021-03-24 LAB — MAGNESIUM: Magnesium: 2.4 mg/dL — ABNORMAL HIGH (ref 1.6–2.3)

## 2021-03-24 LAB — HEPATITIS C ANTIBODY: Hep C Virus Ab: <0.1 s/co ratio (ref 0.0–0.9)

## 2021-03-24 NOTE — Progress Notes (Signed)
Good morning, please let Cindy [name] know her labs are remaining at baseline for her.  She continues to have some mild anemia, but no worsening.  We will recheck next visit, continue iron daily.  Kidneys have ongoing kidney disease present, but no worsening.  Continue visits with kidney doctor.  Magnesium level is mildly elevated, if taking magnesium supplement at home then cut back on this.  Any questions? Keep being awesome!!  Thank you for allowing me to participate in your care.  I appreciate you. Kindest regards, Zebadiah Willert

## 2021-03-25 DIAGNOSIS — G4733 Obstructive sleep apnea (adult) (pediatric): Secondary | ICD-10-CM | POA: Diagnosis not present

## 2021-03-30 ENCOUNTER — Telehealth: Payer: Self-pay

## 2021-03-30 DIAGNOSIS — E1159 Type 2 diabetes mellitus with other circulatory complications: Secondary | ICD-10-CM | POA: Diagnosis not present

## 2021-03-30 DIAGNOSIS — E1169 Type 2 diabetes mellitus with other specified complication: Secondary | ICD-10-CM

## 2021-03-30 DIAGNOSIS — E785 Hyperlipidemia, unspecified: Secondary | ICD-10-CM | POA: Diagnosis not present

## 2021-03-30 DIAGNOSIS — F321 Major depressive disorder, single episode, moderate: Secondary | ICD-10-CM | POA: Diagnosis not present

## 2021-03-30 DIAGNOSIS — I152 Hypertension secondary to endocrine disorders: Secondary | ICD-10-CM | POA: Diagnosis not present

## 2021-03-30 DIAGNOSIS — I480 Paroxysmal atrial fibrillation: Secondary | ICD-10-CM

## 2021-03-30 NOTE — Progress Notes (Signed)
I have called [employer] for Eliquis medication she has been approved on her Patient assistance application from [id number] she will be receiving her next shipment of her medication on 04/20/21 with a 90 day supply. I have also contacted [employer] she has also been approved on 12/2020-05/30/21 she will be receiving her next shipment on 04/01/21 and they will automatically mail the renewal for the patient assistance application for next year to the patient.   Corrie Mckusick, [place]

## 2021-03-31 ENCOUNTER — Telehealth: Payer: Medicare HMO | Admitting: General Practice

## 2021-03-31 ENCOUNTER — Ambulatory Visit (INDEPENDENT_AMBULATORY_CARE_PROVIDER_SITE_OTHER): Payer: Medicare HMO

## 2021-03-31 DIAGNOSIS — E1122 Type 2 diabetes mellitus with diabetic chronic kidney disease: Secondary | ICD-10-CM

## 2021-03-31 DIAGNOSIS — F321 Major depressive disorder, single episode, moderate: Secondary | ICD-10-CM

## 2021-03-31 DIAGNOSIS — E1169 Type 2 diabetes mellitus with other specified complication: Secondary | ICD-10-CM

## 2021-03-31 DIAGNOSIS — I480 Paroxysmal atrial fibrillation: Secondary | ICD-10-CM

## 2021-03-31 DIAGNOSIS — E1159 Type 2 diabetes mellitus with other circulatory complications: Secondary | ICD-10-CM

## 2021-03-31 DIAGNOSIS — I152 Hypertension secondary to endocrine disorders: Secondary | ICD-10-CM

## 2021-03-31 DIAGNOSIS — N184 Chronic kidney disease, stage 4 (severe): Secondary | ICD-10-CM

## 2021-03-31 DIAGNOSIS — G894 Chronic pain syndrome: Secondary | ICD-10-CM

## 2021-03-31 NOTE — Chronic Care Management (AMB) (Signed)
Chronic Care Management   CCM RN Visit Note  03/31/2021 Name: Cindy [name] MRN: [id number] DOB: [birth year]  Subjective: Cindy [name] is a 70 y.o. year old female who is a primary care patient of Cannady, Cindy Faster, NP. The care management team was consulted for assistance with disease management and care coordination needs.    Engaged with patient by telephone for follow up visit in response to provider referral for case management and/or care coordination services.   Consent to Services:  The patient was given information about Chronic Care Management services, agreed to services, and gave verbal consent prior to initiation of services.  Please see initial visit note for detailed documentation.   Patient agreed to services and verbal consent obtained.   Assessment: Review of patient past medical history, allergies, medications, health status, including review of consultants reports, laboratory and other test data, was performed as part of comprehensive evaluation and provision of chronic care management services.   SDOH (Social Determinants of Health) assessments and interventions performed:    CCM Care Plan  Allergies  Allergen Reactions   Bupropion Other (See Comments)   Gabapentin Palpitations   Pioglitazone Palpitations   Vicodin [Hydrocodone-Acetaminophen] Other (See Comments)    Reaction: racing heart   Vicoprofen [Hydrocodone-Ibuprofen] Other (See Comments)    Reaction: racing heart    Outpatient Encounter Medications as of 03/31/2021  Medication Sig   acetaminophen (TYLENOL) 650 MG CR tablet Take 650 mg by mouth every 8 (eight) hours as needed for pain.   albuterol (VENTOLIN HFA) 108 (90 Base) MCG/ACT inhaler Inhale 1-2 puffs into the lungs every 6 (six) hours as needed for wheezing or shortness of breath.   apixaban (ELIQUIS) 5 MG TABS tablet Take 1 tablet (5 mg total) by mouth 2 (two) times daily.   atorvastatin (LIPITOR) 40 MG tablet Take 40 mg by mouth daily.    bisoprolol (ZEBETA) 10 MG tablet Take 1 tablet (10 mg total) by mouth 2 (two) times daily.   cetirizine (ZYRTEC) 10 MG tablet Take 10 mg by mouth daily.   diltiazem (CARDIZEM CD) 360 MG 24 hr capsule Take 360 mg by mouth daily.   DULoxetine (CYMBALTA) 60 MG capsule Take 60 mg by mouth daily.   empagliflozin (JARDIANCE) 10 MG TABS tablet Take 1 tablet (10 mg total) by mouth daily before breakfast.   ferrous sulfate 325 (65 FE) MG tablet Take 1 tablet (325 mg total) by mouth 2 (two) times daily with a meal.   fluticasone (FLONASE) 50 MCG/ACT nasal spray Place 2 sprays into both nostrils daily.   furosemide (LASIX) 80 MG tablet Take 80 mg by mouth 2 (two) times daily.   metFORMIN (GLUCOPHAGE) 1000 MG tablet Take 500 mg by mouth 2 (two) times daily with a meal.   minoxidil (LONITEN) 10 MG tablet Take 10 mg by mouth daily.   montelukast (SINGULAIR) 10 MG tablet Take 1 tablet (10 mg total) by mouth at bedtime.   Multiple Vitamin (MULTIVITAMIN WITH MINERALS) TABS tablet Take 1 tablet by mouth daily.   olmesartan (BENICAR) 20 MG tablet Take 1 tablet (20 mg total) by mouth daily.   omeprazole (PRILOSEC) 20 MG capsule Take 20 mg by mouth daily as needed (heartburn).    polyethylene glycol powder (GLYCOLAX/MIRALAX) powder Take 17 g by mouth daily.    spironolactone (ALDACTONE) 25 MG tablet Take 1 tablet (25 mg total) by mouth daily.   No facility-administered encounter medications on file as of 03/31/2021.    Patient Active  Problem List   Diagnosis Date Noted   Primary hyperparathyroidism ([place]) 03/14/2021   DOE (dyspnea on exertion) 01/13/2021   Post-COVID syndrome 12/03/2020   Recurrent boils 12/03/2020   Morbid obesity ([place]) 12/03/2020   CKD stage 4 due to type 2 diabetes mellitus ([place]) 12/01/2020   Aortic atherosclerosis ([place]) 12/01/2020   Hypertension associated with diabetes ([place]) 07/15/2020   A-fib ([place]) 07/15/2020   Chronic pain syndrome 07/15/2020   Depression, major, single episode,  moderate ([place]) 07/15/2020   Type 2 diabetes mellitus with morbid obesity ([place]) 07/15/2020   GERD (gastroesophageal reflux disease) 07/15/2020   Hyperlipidemia associated with type 2 diabetes mellitus ([place]) 07/15/2020   OSA (obstructive sleep apnea) 07/15/2020   Other thrombophilia ([place]) 03/12/2020   History of lumbar spinal fusion 04/26/2018   Diuretic-induced hypokalemia 03/24/2018   Hypomagnesemia 03/24/2018   Hypertensive retinopathy of both eyes 06/08/2017   DDD (degenerative disc disease), lumbar 10/31/2014   Cervical spondylosis with myelopathy 02/22/2014   Iron deficiency anemia 02/22/2014   Mitral regurgitation 09/08/2013    Conditions to be addressed/monitored:Atrial Fibrillation, HTN, HLD, DMII, CKD Stage 4, Depression, and chronic pain   Care Plan : RNCM: General plan of care for AFIB, HTN, HLD, CKD4, DM, Chronic pain, and Depression  Updates made by Vanita Ingles, RN since 03/31/2021 12:00 AM     Problem: RNCM: General plan of care for AFIB, HTN, HLD, CKD4, DM, Chronic pain, and Depression   Priority: High     Long-Range Goal: RNCM: General plan of care for AFIB, HTN, HLD, CKD4, DM, Chronic pain, and Depression   Start Date: 12/24/2020  Expected End Date: 12/24/2021  Recent Progress: On track  Priority: High  Note:   Current Barriers:  Knowledge Deficits related to plan of care for management of Atrial Fibrillation, HTN, HLD, DMII, CKD Stage 4, Depression, and chronic pain   Care Coordination needs related to Financial constraints related to difficulty in affording medications , Level of care concerns, and Mental Health Concerns  in a patient with Atrial Fibrillation, HTN, HLD, DMII, CKD Stage 4, Depression, and chronic pain  Chronic Disease Management support and education needs related to Atrial Fibrillation, HTN, HLD, DMII, CKD Stage 4, Depression, and chronic pain  Difficulty obtaining medications  RNCM Clinical Goal(s):  Patient will verbalize understanding of  plan for management of Atrial Fibrillation, HTN, HLD, DMII, CKD Stage 4, Depression, and chronic pain  work with All embedded care coordination to address needs related to Atrial Fibrillation, HTN, HLD, DMII, CKD Stage 4, Depression, and chronic pain  and Financial constraints related to medication cost  and Mental Health Concerns  take all medications exactly as prescribed and will call provider for medication related questions attend all scheduled medical appointments: 06-23-2021 demonstrate a decrease in Atrial Fibrillation, HTN, HLD, DMII, CKD Stage 4, Depression, and Chronic pain  exacerbations demonstrate improved adherence to prescribed treatment plan for Atrial Fibrillation, HTN, HLD, DMII, CKD Stage 4, Depression, and chronic pain  demonstrate improved health management independence verbalize basic understanding of Atrial Fibrillation, HTN, HLD, DMII, CKD Stage 4, Depression, and Chronic pain  disease process and self health management plan demonstrate understanding of rationale for each prescribed medication work with CM clinical social worker to help with building coping skills and handle stress over multiple chronic conditions  demonstrate ongoing self health care management ability through collaboration with Consulting civil engineer, provider, and care team.   Interventions: 1:1 collaboration with primary care provider regarding development and update of comprehensive plan  of care as evidenced by provider attestation and co-signature Inter-disciplinary care team collaboration (see longitudinal plan of care)   A-fib:  (Status: Goal on track: YES.) Counseled on increased risk of stroke due to Afib and benefits of anticoagulation for stroke prevention; Reviewed importance of adherence to anticoagulant exactly as prescribed. 01-27-2021: the patient was approved for help with Eliquis until the end of the year. The patient is compliant with medications and is happy that she has received help for the  Eliquis as she was in the donut hole. She states getting help with the medication has helped a whole lot.  Advised patient to discuss changes in AFIB, concerns or questions  with provider; Counseled on bleeding risk associated with Eliquis and importance of self-monitoring for signs/symptoms of bleeding; Counseled on avoidance of NSAIDs due to increased bleeding risk with anticoagulants. 03-31-2021: Reviewed again for the patient to avoid NSAIDS; Counseled on importance of regular laboratory monitoring as prescribed; Counseled on seeking medical attention after a head injury or if there is blood in the urine/stool; Afib action plan reviewed;  Diabetes:  (Status: Goal on track: YES.) Lab Results  Component Value Date   HGBA1C 7.4 (H) 03/23/2021  Assessed patient's understanding of A1c goal: <7% Provided education to patient about basic DM disease process; Reviewed medications with patient and discussed importance of medication adherence. 01-27-2021: The patient states that she is taking the Jardiance and ozempic as directed. She has been approved for help with the Jardiance until the end of the year. 03-13-2021: The patient states that she is no longer taking the Ozempic, after finishing the samples the patient did not know that she was supposed to continue. Advised the patient that she had refills at [facility]. The patient states she is staking the Metformin and Jardiance but not the Ozempic. Advised the patient to call the pharmacy and inquire about the Ozempic. The patient also has an appointment in the office to see the pcp on 03-18-2021 and advised the patient to discuss this with the pcp. Will collaborate with the pcp and pharm D on plan of care for DM and the patient wanting to know if she is supposed to be taking all the medications for her DM. 03-31-2021: The patient saw the pcp recently and I is taking Jardiance. She states that she is wanting to know if they will automatically send it to her or  will she have to call. Will collaborate with the pharm D for assistance as financial assistance was arranged for the patient to get this until the end of the year.  She states she currently has about 2 weeks worth left. Will send an in basket message to the pharm D for assistance.  Counseled on importance of regular laboratory monitoring as prescribed. 01-27-2021: The patient likely will have new lab work in October. 03-13-2021: Review of most recent lab work and A1C. Education on the goal of A1C being less than 7.0. 03-31-2021: Education on A1C being 7.4% with goal of 7.0 Discussed plans with patient for ongoing care management follow up and provided patient with direct contact information for care management team; Provided patient with written educational materials related to hypo and hyperglycemia and importance of correct treatment. 03-31-2021: Review of blood sugars. Denies any lows or highs. Reminded the patient to call the office for blood sugars <70 and >300; Reviewed scheduled/upcoming provider appointments including: 06-23-2021 Advised patient, providing education and rationale, to check cbg bid and record, calling pcp for findings outside established parameters. 01-27-2021:  The patient states that her blood sugar this am was 189.  The patient states it has been going up and down. She has started taking the Jardiance again. Education provided that blood sugars should stabilize with taking Jardiance and Ozempic.  Education on continuing to check blood sugars and call for extreme changes in blood sugar readings. 03-13-2021: The patient states that her blood sugars this am was 189. The patient states that she knows if it gets down to 100 it is too low for her. Education on fasting blood sugar goal of <130 and post prandial of <180.  The patient states she takes her blood sugars are various time and if she feels "funny" or different.  Education and support given. 03-31-2021: The patient states that her blood  sugar this am was 189 and at lunch it was 242. Education provided on goals of blood sugar to be <130 fasting and <180 2 hours after eating. The patient verbalized understanding.  Referral made to pharmacy team for assistance with obtaining financial assistance with Ozempic and other medications, patient spoke to pharmacist on 12-23-2020. 01-27-2021: Working with the pharmacy team for assistance with medications. 03-13-2021: Collaboration with the pharm D concerning assistance with medications compliance and treatment plan of DM ; 03-31-2021: In basket message being sent to pharm D for assistance with Jardiance question . The patient has about 2 weeks worth.  Review of patient status, including review of consultants reports, relevant laboratory and other test results, and medications completed; Advised patient to discuss questions or concern about her DM  with provider, the patient was upset that she "messed up" her Ozempic dose this am and it was a wasted dose. Reflective listening and support given. Education on how to take [place]. Her daughters came and assisted her and will assist again next week until she is comfortable giving the Ozempic to herself. The patient is a little concerned about the paperwork and feels the insurance will not pay for the Ozempic. Reviewed conversation she had with pharm D on 12-23-2020, per the notes the pharm D is working with the patient to help with financial constraints of medications, including Ozempic. 01-27-2021: the patient is doing well with the Ozempic and is now taking Jardiance again. The patient states she is having boils continue to come up in different places on her body, usually between her breast and buttocks. States she has one on her buttocks now. She has been putting cream from the dermatologist on it and that is helping. Has went to wound clinic before for an infected boil and was treated successfully. The patient wants to know what to do for reoccurring "boils".  Advised the patient to follow previous recommendations from the provider and to discuss this also with the pcp on her visit in October. The patient states the cream does take the soreness out. Review of sx and sx of infection and call the office for new onset of fever, chills, or drainage consistent with infection. The patient states that she has not had any sx and sx of infection. Also advised the patient that regulation of blood sugars should also help with occurrence of boils.  Will continue to monitor for changes. 03-13-2021: The patient states she was not aware that she had refills for Ozempic at the pharmacy. Read the provider notes from July to the patient. The patient states she stopped taking the Ozempic when she ran out of the samples. Advised the patient to call the pharmacy and inquire about the refills for  Ozempic. Also advised the patient that the [facility] would collaborate with the pcp and pharm D about the recommendations for medication management of her DM. The patient is following up with the pcp next week. Will continue to monitor. 03-31-2021: the patient saw the pcp recently and has parameters. Review of the notes with the patient. Will continue to monitor for needs or changes.   Hyperlipidemia:  (Status: Goal on track: YES.) Lab Results  Component Value Date   CHOL 152 12/03/2020   Lab Results  Component Value Date   HDL 43 12/03/2020   Lab Results  Component Value Date   LDLCALC 87 12/03/2020   Lab Results  Component Value Date   TRIG 123 12/03/2020  No results found for: CHOLHDL No results found for: LDLDIRECT  Medication review performed; medication list updated in electronic medical record. Takes Lipitor 40 mg for HLD control  Provider established cholesterol goals reviewed; Counseled on importance of regular laboratory monitoring as prescribed; Provided HLD educational materials; Reviewed role and benefits of statin for ASCVD risk reduction; Discussed strategies to  manage statin-induced myalgias; Reviewed importance of limiting foods high in cholesterol 01-27-2021: the patient endorses compliance with heart healthy/ADA diet  Hypertension: (Status: Goal on track: YES.) Last practice recorded BP readings:  BP Readings from Last 3 Encounters:  03/23/21 (!) 108/52  01/13/21 120/72  12/17/20 (!) 128/55  Most recent eGFR/CrCl:  Lab Results  Component Value Date   EGFR 32 (L) 03/23/2021    No components found for: CRCL  Evaluation of current treatment plan related to hypertension self management and patient's adherence to plan as established by provider. The patient states that sometimes her blood pressure goes lower but states recently her systolic has been 778 to 242 range and diastolic 50 to 60. Says that she does get light headed some times and dizzy but not recently. Discussed changing position slowly and monitoring for orthostatic hypotension. Explained what orthostatic hypotension was and to be safe when changing positions due to the possibility of blood pressure dropping. 01-27-2021: The patient states she is doing well with management of her HTN. Saw cardiologist recently and pulmonary. Lisinopril discontinued and the patient was started on olmesartan 40 mg. The patient states her cough is better. The patient states the cardiologist may take her off of some of her medications at next visit. States she feels good. 03-13-2021: The patient had ask the LCSW to get the [facility] to call her concerning some questions she had about her blood pressures and it dropping. The patient states that sometimes her blood pressure is 106/55 and she feels like she is dizzy. She states she doesn't know if it is some of the medications she takes because the bottles say may cause dizziness or if it is vertigo which she has had before or her blood pressure dropping. Reviewed in detail about the possibility of orthostatic hypotension and the need to change position slowly because blood  pressures could drop lower when changing position if moving too fast. Education to use precaution when going from a lying to a sitting, a sitting to a standing.  Also education on monitoring for sinus changes and possible vertigo. Reminded the patient to be safe and by being dizzy this put her at higher risk of falls. The patient states she is being careful and she does not want to fall. 03-31-2021: The patient saw the pcp recently and medications changes made. The patient states that she is not feeling light headed and dizzy like she  was. She feels the medication changes have helped a lot. Review of safety and changing position slowly.  Provided education to patient re: stroke prevention, s/s of heart attack and stroke; Reviewed medications with patient and discussed importance of compliance. 03-31-2021: States compliance with medications ; Discussed plans with patient for ongoing care management follow up and provided patient with direct contact information for care management team; Advised patient, providing education and rationale, to monitor blood pressure daily and record, calling PCP for findings outside established parameters;  Reviewed scheduled/upcoming provider appointments including: 06-23-2021 Provided education on prescribed diet heart healthy/ADA diet. 03-31-2021: States she is eating well and is compliant with a heart healthy/ADA diet;  Discussed complications of poorly controlled blood pressure such as heart disease, stroke, circulatory complications, vision complications, kidney impairment, sexual dysfunction;    CKD4 (Status: Goal on track: YES.) Lab Results  Component Value Date   CREATININE 1.70 (H) 03/23/2021   CREATININE 1.61 (H) 12/03/2020   CREATININE 1.99 (H) 11/19/2020    Evaluation of current treatment plan related to CKD Stage 4 ,  self-management and patient's adherence to plan as established by provider. Discussed plans with patient for ongoing care management follow up  and provided patient with direct contact information for care management team Evaluation of current treatment plan related to CKD4 and patient's adherence to plan as established by provider. 01-27-2021: The patient is doing well and denies any issues at this time with her kidneys. States she is feeling much better and thankful her health is better after COVID and hospitalization. Will continue to monitor. 03-31-2021: the patient wanted more information about her CKD4. Education provided so the pateint would understand about her kidney functions. Reminded the patient to stay away from NSAIDS and follow the recommendations of the provider. The patient states she will see the specialist this month. Encouraged the patient to write down questions to ask the specialist at her visit.  Advised patient to call the office for changes in condition, questions or concerns; Provided education to patient re: staying hydrated, eating healthy, pacing activity, keeping follow up with pcp and specialist; Reviewed medications with patient and discussed compliance. 03-31-2021: Is compliant with medications.; Reviewed scheduled/upcoming provider appointments including 06-23-2021 Discussed plans with patient for ongoing care management follow up and provided patient with direct contact information for care management team;   Depression (Status: Goal on track: YES.) Evaluation of current treatment plan related to Depression, Mental Health Concerns  self-management and patient's adherence to plan as established by provider. Discussed plans with patient for ongoing care management follow up and provided patient with direct contact information for care management team Evaluation of current treatment plan related to depression  and patient's adherence to plan as established by provider. 01-27-2021: The patient is doing well and just celebrated her 70st birthday. Her family took her out on Sunday and she had a good day on her actual  birthday yesterday. The patient states she is doing much better and happy that her health is improving. Denies any issues with depression at this time. Will continue to monitor. 03-31-2021: Doing well denies any new concerns with depression.  Advised patient to call the office for changes in mood, anxiety, or increased episodes of depression; Provided education to patient re: working with CCM team to effectively manage her depression; Reviewed medications with patient and discussed compliance ; Reviewed scheduled/upcoming provider appointments including: 03-18-2021 at 1120 am Social Work referral for assistance with coping skills and recommendations for effective management of depression. 01-27-2021:  Actively working with the [employer] for ongoing support and education; Discussed plans with patient for ongoing care management follow up and provided patient with direct contact information for care management team; Screening for signs and symptoms of depression related to chronic disease state;  Assessed social determinant of health barriers;   Pain:  (Status: Goal on track: YES.) Pain assessment performed Medications reviewed Reviewed provider established plan for pain management. 03-31-2021: The patient is using Tylenol arthritis when she needs to for pain. States pain varies daily. Denies any pain today. Knows what to do for flare ups. States her butt cheek hurts sometimes and she thinks it is from "sciatica". Denies any acute distress. Will continue to monitor. Discussed importance of adherence to all scheduled medical appointments; Counseled on the importance of reporting any/all new or changed pain symptoms or management strategies to pain management provider; Advised patient to report to care team affect of pain on daily activities; Discussed use of relaxation techniques and/or diversional activities to assist with pain reduction (distraction, imagery, relaxation, massage, acupressure, TENS, heat, and  cold application. 03-31-2021: Education on using ted hose or compression socks to help with any neuropathy pain and elevate legs when sitting down.  Reviewed with patient prescribed pharmacological and nonpharmacological pain relief strategies; Advised patient to discuss increased level or intensity of pain or uncontrolled pain  with provider;  Patient Goals/Self-Care Activities: Patient will self administer medications as prescribed Patient will attend all scheduled provider appointments Patient will call pharmacy for medication refills Patient will attend church or other social activities Patient will continue to perform ADL's independently Patient will continue to perform IADL's independently Patient will call provider office for new concerns or questions  Follow Up Plan: Telephone follow up appointment with care management team member scheduled for: 05-13-2021 at 1 pm      Plan:Telephone follow up appointment with care management team member scheduled for:  05-13-2021 at 1 pm  [place], MSN, [facility] Mobile: [phone]

## 2021-03-31 NOTE — Patient Instructions (Signed)
Visit Information  The patient verbalized understanding of instructions, educational materials, and care plan provided today and declined offer to receive copy of patient instructions, educational materials, and care plan.   Telephone follow up appointment with care management team member scheduled for: 05-13-2021 at 1 pm  Noreene Larsson RN, MSN, [facility] Mobile: [phone]

## 2021-04-22 DIAGNOSIS — I10 Essential (primary) hypertension: Secondary | ICD-10-CM | POA: Diagnosis not present

## 2021-04-22 DIAGNOSIS — E1122 Type 2 diabetes mellitus with diabetic chronic kidney disease: Secondary | ICD-10-CM | POA: Diagnosis not present

## 2021-04-22 DIAGNOSIS — D631 Anemia in chronic kidney disease: Secondary | ICD-10-CM | POA: Diagnosis not present

## 2021-04-22 DIAGNOSIS — N2581 Secondary hyperparathyroidism of renal origin: Secondary | ICD-10-CM | POA: Diagnosis not present

## 2021-04-22 DIAGNOSIS — N1832 Chronic kidney disease, stage 3b: Secondary | ICD-10-CM | POA: Diagnosis not present

## 2021-04-27 ENCOUNTER — Other Ambulatory Visit: Payer: Self-pay

## 2021-04-27 ENCOUNTER — Ambulatory Visit
Admission: RE | Admit: 2021-04-27 | Discharge: 2021-04-27 | Disposition: A | Discharge status: Home / Self Care | Payer: Medicare HMO | Source: Ambulatory Visit | Attending: Nurse Practitioner | Admitting: Nurse Practitioner

## 2021-04-27 DIAGNOSIS — Z780 Asymptomatic menopausal state: Secondary | ICD-10-CM | POA: Diagnosis not present

## 2021-04-27 NOTE — Progress Notes (Signed)
Please let Cindy [name] know her bone density screening returned normal.  For this we will repeat in 10 years.  Any questions?

## 2021-04-29 DIAGNOSIS — E785 Hyperlipidemia, unspecified: Secondary | ICD-10-CM | POA: Diagnosis not present

## 2021-04-29 DIAGNOSIS — F321 Major depressive disorder, single episode, moderate: Secondary | ICD-10-CM

## 2021-04-29 DIAGNOSIS — N184 Chronic kidney disease, stage 4 (severe): Secondary | ICD-10-CM | POA: Diagnosis not present

## 2021-04-29 DIAGNOSIS — E1122 Type 2 diabetes mellitus with diabetic chronic kidney disease: Secondary | ICD-10-CM

## 2021-04-29 DIAGNOSIS — E1169 Type 2 diabetes mellitus with other specified complication: Secondary | ICD-10-CM

## 2021-04-29 DIAGNOSIS — E1159 Type 2 diabetes mellitus with other circulatory complications: Secondary | ICD-10-CM

## 2021-04-29 DIAGNOSIS — I152 Hypertension secondary to endocrine disorders: Secondary | ICD-10-CM

## 2021-04-29 DIAGNOSIS — I480 Paroxysmal atrial fibrillation: Secondary | ICD-10-CM | POA: Diagnosis not present

## 2021-04-30 ENCOUNTER — Other Ambulatory Visit: Payer: Self-pay | Admitting: Nurse Practitioner

## 2021-05-01 NOTE — Telephone Encounter (Signed)
Requested medication (s) are due for refill today: yes  Requested medication (s) are on the active medication list: yes  Last refill:  02/06/21  Future visit scheduled: 06/23/21  Notes to clinic:  History obtained from Historical Provider, please assess.       Requested Prescriptions  Pending Prescriptions Disp Refills   minoxidil (LONITEN) 10 MG tablet [Pharmacy Med Name: minoxidil 10 mg tablet] 90 tablet 3    Sig: TAKE ONE TABLET BY MOUTH ONCE DAILY FOR BLOOD PRESSURE     Cardiovascular: Vasodilators - minoxidil Passed - 04/30/2021 10:15 AM      Passed - Last BP in normal range    BP Readings from Last 1 Encounters:  03/23/21 (!) 108/52          Passed - Valid encounter within last 12 months    Recent Outpatient Visits           1 month ago Type 2 diabetes mellitus with morbid obesity ([place])   [place], Jolene T, NP   4 months ago Type 2 diabetes mellitus with morbid obesity ([place])   [place], [place] T, NP   4 months ago Encounter to establish care   [facility] [place], Henrine Screws T, NP       Future Appointments             In 1 month Cannady, Barbaraann Faster, NP [facility]   In 8 months  [facility]             furosemide (LASIX) 80 MG tablet [Pharmacy Med Name: furosemide 80 mg tablet] 180 tablet 3    Sig: TAKE ONE TABLET BY MOUTH TWICE DAILY FOR FLUID     Cardiovascular:  Diuretics - Loop Failed - 04/30/2021 10:15 AM      Failed - Cr in normal range and within 360 days    Creatinine  Date Value Ref Range Status  01/24/2014 0.86 0.60 - 1.30 mg/dL Final   Creatinine, Ser  Date Value Ref Range Status  03/23/2021 1.70 (H) 0.57 - 1.00 mg/dL Final          Passed - K in normal range and within 360 days    Potassium  Date Value Ref Range Status  03/23/2021 4.9 3.5 - 5.2 mmol/L Final  01/24/2014 3.4 (L) 3.5 - 5.1 mmol/L Final          Passed - Ca in normal range and  within 360 days    Calcium  Date Value Ref Range Status  03/23/2021 9.3 8.7 - 10.3 mg/dL Final   Calcium, Total  Date Value Ref Range Status  01/24/2014 8.5 8.5 - 10.1 mg/dL Final          Passed - Na in normal range and within 360 days    Sodium  Date Value Ref Range Status  03/23/2021 138 134 - 144 mmol/L Final  01/24/2014 143 136 - 145 mmol/L Final          Passed - Last BP in normal range    BP Readings from Last 1 Encounters:  03/23/21 (!) 108/52          Passed - Valid encounter within last 6 months    Recent Outpatient Visits           1 month ago Type 2 diabetes mellitus with morbid obesity ([place])   [place], [place] T, NP   4 months ago Type 2  diabetes mellitus with morbid obesity ([place])   [place], Henrine Screws T, NP   4 months ago Encounter to establish care   [place], Barbaraann Faster, NP       Future Appointments             In 1 month Cannady, Barbaraann Faster, NP [facility]   In 8 months  [facility]

## 2021-05-13 ENCOUNTER — Telehealth: Payer: Medicare HMO

## 2021-05-18 ENCOUNTER — Ambulatory Visit (INDEPENDENT_AMBULATORY_CARE_PROVIDER_SITE_OTHER): Payer: Medicare HMO | Admitting: Licensed Clinical Social Worker

## 2021-05-18 ENCOUNTER — Telehealth: Payer: Self-pay | Admitting: Nurse Practitioner

## 2021-05-18 DIAGNOSIS — F321 Major depressive disorder, single episode, moderate: Secondary | ICD-10-CM

## 2021-05-18 DIAGNOSIS — E1159 Type 2 diabetes mellitus with other circulatory complications: Secondary | ICD-10-CM

## 2021-05-18 DIAGNOSIS — E1122 Type 2 diabetes mellitus with diabetic chronic kidney disease: Secondary | ICD-10-CM

## 2021-05-18 DIAGNOSIS — M5136 Other intervertebral disc degeneration, lumbar region: Secondary | ICD-10-CM

## 2021-05-18 DIAGNOSIS — E1169 Type 2 diabetes mellitus with other specified complication: Secondary | ICD-10-CM

## 2021-05-18 NOTE — Telephone Encounter (Signed)
Medication Refill - Medication: Metformin   Has the patient contacted their pharmacy? Yes.   Autumn, from Pharmacy, calling stating that the pt is out of this medication and they have no refills on file. Please advise.  (Agent: If no, request that the patient contact the pharmacy for the refill. If patient does not wish to contact the pharmacy document the reason why and proceed with request.) (Agent: If yes, when and what did the pharmacy advise?)  Preferred Pharmacy (with phone number or street name):  [place] [place] [address]  [address] [place] [address]  Phone: [phone] Fax: [phone]  Hours: Not open 24 hours   Has the patient been seen for an appointment in the last year OR does the patient have an upcoming appointment? Yes.    Agent: Please be advised that RX refills may take up to 3 business days. We ask that you follow-up with your pharmacy.

## 2021-05-19 NOTE — Chronic Care Management (AMB) (Signed)
Chronic Care Management    Clinical Social Work Note  05/19/2021 Name: Cindy [name] MRN: [id number] DOB: [birth year]  Cindy [name] is a 70 y.o. year old female who is a primary care patient of Cannady, Barbaraann Faster, NP. The CCM team was consulted to assist the patient with chronic disease management and/or care coordination needs related to: Mental Health Counseling and Resources.   Engaged with patient by telephone for follow up visit in response to provider referral for social work chronic care management and care coordination services.   Consent to Services:  The patient was given information about Chronic Care Management services, agreed to services, and gave verbal consent prior to initiation of services.  Please see initial visit note for detailed documentation.   Patient agreed to services and consent obtained.   Consent to Services:  The patient was given information about Care Management services, agreed to services, and gave verbal consent prior to initiation of services.  Please see initial visit note for detailed documentation.   Patient agreed to services today and consent obtained.  Engaged with patient by phone in response to provider referral for social work care coordination services:  Assessment/Interventions:  Patient continues to maintain positive progress with care plan goals. She reports "personal" stressors and CCM LCSW discussed strategies for stress management See Care Plan below for interventions and patient self-care activities.  Recent life changes or stressors: Management of health conditions  Recommendation: Patient may benefit from, and is in agreement work with LCSW to address care coordination needs and will continue to work with the clinical team to address health care and disease management related needs.   Follow up Plan: Patient would like continued follow-up from CCM LCSW.  per patient's request will follow up in 03/.  Will call office if needed  prior to next encounter.  SDOH (Social Determinants of Health) assessments and interventions performed:    Advanced Directives Status: Not addressed in this encounter.  CCM Care Plan  Allergies  Allergen Reactions   Bupropion Other (See Comments)   Gabapentin Palpitations   Pioglitazone Palpitations   Vicodin [Hydrocodone-Acetaminophen] Other (See Comments)    Reaction: racing heart   Vicoprofen [Hydrocodone-Ibuprofen] Other (See Comments)    Reaction: racing heart    Outpatient Encounter Medications as of 05/18/2021  Medication Sig   acetaminophen (TYLENOL) 650 MG CR tablet Take 650 mg by mouth every 8 (eight) hours as needed for pain.   albuterol (VENTOLIN HFA) 108 (90 Base) MCG/ACT inhaler Inhale 1-2 puffs into the lungs every 6 (six) hours as needed for wheezing or shortness of breath.   apixaban (ELIQUIS) 5 MG TABS tablet Take 1 tablet (5 mg total) by mouth 2 (two) times daily.   atorvastatin (LIPITOR) 40 MG tablet Take 40 mg by mouth daily.   bisoprolol (ZEBETA) 10 MG tablet Take 1 tablet (10 mg total) by mouth 2 (two) times daily.   cetirizine (ZYRTEC) 10 MG tablet Take 10 mg by mouth daily.   diltiazem (CARDIZEM CD) 360 MG 24 hr capsule Take 360 mg by mouth daily.   DULoxetine (CYMBALTA) 60 MG capsule Take 60 mg by mouth daily.   empagliflozin (JARDIANCE) 10 MG TABS tablet Take 1 tablet (10 mg total) by mouth daily before breakfast.   ferrous sulfate 325 (65 FE) MG tablet Take 1 tablet (325 mg total) by mouth 2 (two) times daily with a meal.   fluticasone (FLONASE) 50 MCG/ACT nasal spray Place 2 sprays into both nostrils daily.  furosemide (LASIX) 80 MG tablet TAKE ONE TABLET BY MOUTH TWICE DAILY FOR FLUID   metFORMIN (GLUCOPHAGE) 1000 MG tablet Take 500 mg by mouth 2 (two) times daily with a meal.   minoxidil (LONITEN) 10 MG tablet TAKE ONE TABLET BY MOUTH ONCE DAILY FOR BLOOD PRESSURE   montelukast (SINGULAIR) 10 MG tablet Take 1 tablet (10 mg total) by mouth at bedtime.    Multiple Vitamin (MULTIVITAMIN WITH MINERALS) TABS tablet Take 1 tablet by mouth daily.   olmesartan (BENICAR) 20 MG tablet Take 1 tablet (20 mg total) by mouth daily.   omeprazole (PRILOSEC) 20 MG capsule Take 20 mg by mouth daily as needed (heartburn).    polyethylene glycol powder (GLYCOLAX/MIRALAX) powder Take 17 g by mouth daily.    spironolactone (ALDACTONE) 25 MG tablet Take 1 tablet (25 mg total) by mouth daily.   No facility-administered encounter medications on file as of 05/18/2021.    Patient Active Problem List   Diagnosis Date Noted   Primary hyperparathyroidism ([place]) 03/14/2021   DOE (dyspnea on exertion) 01/13/2021   Post-COVID syndrome 12/03/2020   Recurrent boils 12/03/2020   Morbid obesity ([place]) 12/03/2020   CKD stage 4 due to type 2 diabetes mellitus ([place]) 12/01/2020   Aortic atherosclerosis ([place]) 12/01/2020   Hypertension associated with diabetes ([place]) 07/15/2020   A-fib ([place]) 07/15/2020   Chronic pain syndrome 07/15/2020   Depression, major, single episode, moderate ([place]) 07/15/2020   Type 2 diabetes mellitus with morbid obesity ([place]) 07/15/2020   GERD (gastroesophageal reflux disease) 07/15/2020   Hyperlipidemia associated with type 2 diabetes mellitus ([place]) 07/15/2020   OSA (obstructive sleep apnea) 07/15/2020   Other thrombophilia ([place]) 03/12/2020   History of lumbar spinal fusion 04/26/2018   Diuretic-induced hypokalemia 03/24/2018   Hypomagnesemia 03/24/2018   Hypertensive retinopathy of both eyes 06/08/2017   DDD (degenerative disc disease), lumbar 10/31/2014   Cervical spondylosis with myelopathy 02/22/2014   Iron deficiency anemia 02/22/2014   Mitral regurgitation 09/08/2013    Conditions to be addressed/monitored: HTN, DMII, CKD Stage 4, and Depression  Care Plan : General Social Work (Adult)  Updates made by Rebekah [place], LCSW since 05/19/2021 12:00 AM     Problem: Coping Skills (General Plan of Care)      Goal: Coping Skills  Enhanced   Start Date: 01/16/2021  This Visit's Progress: On track  Recent Progress: On track  Priority: High  Note:   Current barriers:   Acute Mental Health needs related to depression Mental Health Concerns  Needs Support, Education, and Care Coordination in order to meet unmet mental health needs. Clinical Goal(s): Over the next 120 days, patient will work with SW, counselor and therapist to reduce or manage symptoms of agitation, mood instability, stress, and bipolar until connected for ongoing counseling. Clinical Interventions:  1:1 collaboration with primary care provider regarding development and update of comprehensive plan of care as evidenced by provider attestation and co-signature Inter-disciplinary care team collaboration (see longitudinal plan of care) Assessed patient's previous and current treatment, coping skills, support system and barriers to care  Patient reports difficulty managing symptoms of depression triggered by chronic pain, chronic health conditions, and limited mobility She endorses recent decrease in symptoms (low energy, feeling sad, and anxiety about pain) since "feeling better and becoming stronger" after multiple hospital visits the beginning of 2022 10/13: Patient reports depression symptoms are managed. "I still have my good and bad days but things are going pretty good" 12/19: Patient reports compliance with medications and eating  healthy to assist with management of BS readings. Patient shared the her readings have escalated due to "personal" stressors, she did not want to elaborate. CCM LCSW assisted patient in identifying strategies to assist with stress management ie) positive thinking, praying, and reading scriptures Patient endorses dizzy spells occasionally, believing it is a side effect of her medications. Denies recent falls Patient endorses instances of low blood sugar. When this occurs, patient will drink orange juice or eat a peppermint candy.  Patient is unsure as to what is an appropriate blood sugar/blood pressure reading. CCM LCSW will collaborate with CCM RNCM to provide educational information to promote health and well-being CCM LCSW provided validation and encouragement. Healthy coping skills were discussed to assist with management of symptoms Patient was successful in identifying healthy coping skills that she can utilize daily. Enjoys driving to maintain independence 10/13: Patient continues to utilize healthy coping skills when she experiences depression symptoms. No report of SI/HI Patient has a strong support system. Patient was excited about approval for medicine to assist with financial strain 10/13: Patient's daughter will provide transportation and emotional support at upcoming PCP appointment CCM LCSW informed patient of Medicare's Open Enrollement Period and answered questions patient had regarding various benefits with Medicare Plans. LCSW encouraged patient to contact [facility] with any specific questions regarding her plan; however, patient is not interested in changing her insurance plan at this time CCM LCSW completed staff message to [facility] PharmD regarding patient's request for a follow up call from CCM Pharmacist to assist her with renewal of medication assistance application Patient walks in the house and participates in exercise to promote positive mood. She utilizes a walker and cane Patient reports compliance with medication management CCM LCSW reviewed upcoming appointments with patient CCM LCSW discussed strategies to reduce fall risk Mindfulness or Relaxation Training, Active listening / Reflection utilized , Emotional Supportive Provided, Provided psychoeducation for mental health needs , Reviewed mental health medications with patient and discussed compliance: , Verbalization of feelings encouraged , and Suicidal Ideation/Homicidal Ideation assessed: Patient denies SI/HI Patient Goals/Self-Care Activities: Over the  next 120 days Contact clinic with any questions or concerns Utilize healthy coping skills discussed and continue compliance with meds Attend scheduled appointments with providers         Christa See, MSW, [email or web address] Phone [phone] 2:15 PM

## 2021-05-19 NOTE — Patient Instructions (Signed)
Visit Information  Thank you for taking time to visit with me today. Please don't hesitate to contact me if I can be of assistance to you before our next scheduled telephone appointment.  Following are the goals we discussed today:  Patient Goals/Self-Care Activities: Over the next 120 days Contact clinic with any questions or concerns Utilize healthy coping skills discussed and continue compliance with meds Attend scheduled appointments with providers  Our next appointment is by telephone on 08/17/21 at 10:00 AM  Please call the care guide team at [phone] if you need to cancel or reschedule your appointment.   If you are experiencing a Mental Health or [place] or need someone to talk to, please call the Suicide and Crisis Lifeline: 988 call 911   Patient verbalizes understanding of instructions provided today  Christa See, MSW, [email or web address] Phone [phone] 2:24 PM

## 2021-05-19 NOTE — Telephone Encounter (Signed)
Requested medication (s) are due for refill today: ?  Requested medication (s) are on the active medication list: Yes  Last refill:  ?  Future visit scheduled: Yes  Notes to clinic:  Historical provider.    Requested Prescriptions  Pending Prescriptions Disp Refills   metFORMIN (GLUCOPHAGE) 1000 MG tablet      Sig: Take 0.5 tablets (500 mg total) by mouth 2 (two) times daily with a meal.     Endocrinology:  Diabetes - Biguanides Failed - 05/19/2021  2:28 PM      Failed - Cr in normal range and within 360 days    Creatinine  Date Value Ref Range Status  01/24/2014 0.86 0.60 - 1.30 mg/dL Final   Creatinine, Ser  Date Value Ref Range Status  03/23/2021 1.70 (H) 0.57 - 1.00 mg/dL Final          Failed - AA eGFR in normal range and within 360 days    EGFR (African American)  Date Value Ref Range Status  01/24/2014 >60  Final   GFR calc Af Amer  Date Value Ref Range Status  03/19/2017 >60 >60 mL/min Final    Comment:    (NOTE) The eGFR has been calculated using the CKD EPI equation. This calculation has not been validated in all clinical situations. eGFR's persistently <60 mL/min signify possible Chronic Kidney Disease.    EGFR (Non-African Amer.)  Date Value Ref Range Status  01/24/2014 >60  Final    Comment:    eGFR values <83m/min/1.73 m2 may be an indication of chronic kidney disease (CKD). Calculated eGFR is useful in patients with stable renal function. The eGFR calculation will not be reliable in acutely ill patients when serum creatinine is changing rapidly. It is not useful in  patients on dialysis. The eGFR calculation may not be applicable to patients at the low and high extremes of body sizes, pregnant women, and vegetarians.    GFR, Estimated  Date Value Ref Range Status  11/19/2020 27 (L) >60 mL/min Final    Comment:    (NOTE) Calculated using the CKD-EPI Creatinine Equation (2021)    eGFR  Date Value Ref Range Status  03/23/2021 32 (L)  >59 mL/min/1.73 Final          Passed - HBA1C is between 0 and 7.9 and within 180 days    HB A1C (BAYER DCA - WAIVED)  Date Value Ref Range Status  03/23/2021 7.4 (H) 4.8 - 5.6 % Final    Comment:             Prediabetes: 5.7 - 6.4          Diabetes: >6.4          Glycemic control for adults with diabetes: <7.0           Passed - Valid encounter within last 6 months    Recent Outpatient Visits           1 month ago Type 2 diabetes mellitus with morbid obesity ([place]   [place]ol[place] T, NP   5 months ago Type 2 diabetes mellitus with morbid obesity ([place]   [place] J[place]rine ScrewsT, NP   5 months ago Encounter to establish care   [place] J[place]baraann Faster NP       Future Appointments             In 1 month Cannady, JBarbaraann Faster NP [facility]   In  8 months  [facility]

## 2021-05-20 MED ORDER — METFORMIN HCL 1000 MG PO TABS: 500.0000 mg | ORAL_TABLET | Freq: Two times a day (BID) | ORAL | 4 refills | Status: DC

## 2021-05-20 NOTE — Addendum Note (Signed)
Addended by: Marnee Guarneri T on: 05/20/2021 02:15 PM   Modules accepted: Orders

## 2021-05-20 NOTE — Telephone Encounter (Signed)
Pt has an appt that was already scheduled on 1/24

## 2021-05-21 ENCOUNTER — Telehealth: Payer: Self-pay

## 2021-05-21 NOTE — Progress Notes (Signed)
I have attempted to reach out to the patient about further assisting with the renewal of her Patient assistance application. I have left a voicemail for her to call back.

## 2021-05-30 DIAGNOSIS — E785 Hyperlipidemia, unspecified: Secondary | ICD-10-CM

## 2021-05-30 DIAGNOSIS — E1169 Type 2 diabetes mellitus with other specified complication: Secondary | ICD-10-CM | POA: Diagnosis not present

## 2021-05-30 DIAGNOSIS — E1122 Type 2 diabetes mellitus with diabetic chronic kidney disease: Secondary | ICD-10-CM | POA: Diagnosis not present

## 2021-05-30 DIAGNOSIS — E1159 Type 2 diabetes mellitus with other circulatory complications: Secondary | ICD-10-CM | POA: Diagnosis not present

## 2021-05-30 DIAGNOSIS — I152 Hypertension secondary to endocrine disorders: Secondary | ICD-10-CM | POA: Diagnosis not present

## 2021-05-30 DIAGNOSIS — N184 Chronic kidney disease, stage 4 (severe): Secondary | ICD-10-CM | POA: Diagnosis not present

## 2021-05-30 DIAGNOSIS — F321 Major depressive disorder, single episode, moderate: Secondary | ICD-10-CM | POA: Diagnosis not present

## 2021-06-21 DIAGNOSIS — I517 Cardiomegaly: Secondary | ICD-10-CM | POA: Insufficient documentation

## 2021-06-21 NOTE — Patient Instructions (Addendum)
Start Ozempic 0.25 MG weekly injected for 4 weeks and then increase to 0.5 MG weekly.  Diabetes Mellitus and Nutrition, Adult When you have diabetes, or diabetes mellitus, it is very important to have healthy eating habits because your blood sugar (glucose) levels are greatly affected by what you eat and drink. Eating healthy foods in the right amounts, at about the same times every day, can help you: Manage your blood glucose. Lower your risk of heart disease. Improve your blood pressure. Reach or maintain a healthy weight. What can affect my meal plan? Every person with diabetes is different, and each person has different needs for a meal plan. Your health care provider may recommend that you work with a dietitian to make a meal plan that is best for you. Your meal plan may vary depending on factors such as: The calories you need. The medicines you take. Your weight. Your blood glucose, blood pressure, and cholesterol levels. Your activity level. Other health conditions you have, such as heart or kidney disease. How do carbohydrates affect me? Carbohydrates, also called carbs, affect your blood glucose level more than any other type of food. Eating carbs raises the amount of glucose in your blood. It is important to know how many carbs you can safely have in each meal. This is different for every person. Your dietitian can help you calculate how many carbs you should have at each meal and for each snack. How does alcohol affect me? Alcohol can cause a decrease in blood glucose (hypoglycemia), especially if you use insulin or take certain diabetes medicines by mouth. Hypoglycemia can be a life-threatening condition. Symptoms of hypoglycemia, such as sleepiness, dizziness, and confusion, are similar to symptoms of having too much alcohol. Do not drink alcohol if: Your health care provider tells you not to drink. You are pregnant, may be pregnant, or are planning to become pregnant. If you  drink alcohol: Limit how much you have to: 0-1 drink a day for women. 0-2 drinks a day for men. Know how much alcohol is in your drink. In the [place] one drink equals one 12 oz bottle of beer (355 mL), one 5 oz glass of wine (148 mL), or one 1 oz glass of hard liquor (44 mL). Keep yourself hydrated with water, diet soda, or unsweetened iced tea. Keep in mind that regular soda, juice, and other mixers may contain a lot of sugar and must be counted as carbs. What are tips for following this plan? Reading food labels Start by checking the serving size on the Nutrition Facts label of packaged foods and drinks. The number of calories and the amount of carbs, fats, and other nutrients listed on the label are based on one serving of the item. Many items contain more than one serving per package. Check the total grams (g) of carbs in one serving. Check the number of grams of saturated fats and trans fats in one serving. Choose foods that have a low amount or none of these fats. Check the number of milligrams (mg) of salt (sodium) in one serving. Most people should limit total sodium intake to less than 2,300 mg per day. Always check the nutrition information of foods labeled as "low-fat" or "nonfat." These foods may be higher in added sugar or refined carbs and should be avoided. Talk to your dietitian to identify your daily goals for nutrients listed on the label. Shopping Avoid buying canned, pre-made, or processed foods. These foods tend to be high in fat, sodium,  and added sugar. Shop around the outside edge of the grocery store. This is where you will most often find fresh fruits and vegetables, bulk grains, fresh meats, and fresh dairy products. Cooking Use low-heat cooking methods, such as baking, instead of high-heat cooking methods, such as deep frying. Cook using healthy oils, such as olive, canola, or sunflower oil. Avoid cooking with butter, cream, or high-fat meats. Meal planning Eat meals  and snacks regularly, preferably at the same times every day. Avoid going long periods of time without eating. Eat foods that are high in fiber, such as fresh fruits, vegetables, beans, and whole grains. Eat 4-6 oz (112-168 g) of lean protein each day, such as lean meat, chicken, fish, eggs, or tofu. One ounce (oz) (28 g) of lean protein is equal to: 1 oz (28 g) of meat, chicken, or fish. 1 egg.  cup (62 g) of tofu. Eat some foods each day that contain healthy fats, such as avocado, nuts, seeds, and fish. What foods should I eat? Fruits Berries. Apples. Oranges. Peaches. Apricots. Plums. Grapes. Mangoes. Papayas. Pomegranates. Kiwi. Cherries. Vegetables Leafy greens, including lettuce, spinach, kale, chard, collard greens, mustard greens, and cabbage. Beets. Cauliflower. Broccoli. Carrots. Green beans. Tomatoes. Peppers. Onions. Cucumbers. Brussels sprouts. Grains Whole grains, such as whole-wheat or whole-grain bread, crackers, tortillas, cereal, and pasta. Unsweetened oatmeal. Quinoa. Brown or wild rice. Meats and other proteins Seafood. Poultry without skin. Lean cuts of poultry and beef. Tofu. Nuts. Seeds. Dairy Low-fat or fat-free dairy products such as milk, yogurt, and cheese. The items listed above may not be a complete list of foods and beverages you can eat and drink. Contact a dietitian for more information. What foods should I avoid? Fruits Fruits canned with syrup. Vegetables Canned vegetables. Frozen vegetables with butter or cream sauce. Grains Refined white flour and flour products such as bread, pasta, snack foods, and cereals. Avoid all processed foods. Meats and other proteins Fatty cuts of meat. Poultry with skin. Breaded or fried meats. Processed meat. Avoid saturated fats. Dairy Full-fat yogurt, cheese, or milk. Beverages Sweetened drinks, such as soda or iced tea. The items listed above may not be a complete list of foods and beverages you should avoid. Contact  a dietitian for more information. Questions to ask a health care provider Do I need to meet with a certified diabetes care and education specialist? Do I need to meet with a dietitian? What number can I call if I have questions? When are the best times to check my blood glucose? Where to find more information: American Diabetes Association: diabetes.org Academy of Nutrition and Dietetics: eatright.[employer] of Diabetes and Digestive and Kidney Diseases: [employer] Association of Diabetes Care & Education Specialists: diabeteseducator.org Summary It is important to have healthy eating habits because your blood sugar (glucose) levels are greatly affected by what you eat and drink. It is important to use alcohol carefully. A healthy meal plan will help you manage your blood glucose and lower your risk of heart disease. Your health care provider may recommend that you work with a dietitian to make a meal plan that is best for you. This information is not intended to replace advice given to you by your health care provider. Make sure you discuss any questions you have with your health care provider. Document Revised: 12/19/2019 Document Reviewed: 12/19/2019 Elsevier Patient Education  [place].

## 2021-06-23 ENCOUNTER — Ambulatory Visit (INDEPENDENT_AMBULATORY_CARE_PROVIDER_SITE_OTHER): Payer: Medicare HMO | Admitting: Nurse Practitioner

## 2021-06-23 ENCOUNTER — Encounter: Payer: Self-pay | Admitting: Nurse Practitioner

## 2021-06-23 ENCOUNTER — Telehealth: Payer: Self-pay

## 2021-06-23 ENCOUNTER — Other Ambulatory Visit: Payer: Self-pay

## 2021-06-23 DIAGNOSIS — D6869 Other thrombophilia: Secondary | ICD-10-CM

## 2021-06-23 DIAGNOSIS — E1169 Type 2 diabetes mellitus with other specified complication: Secondary | ICD-10-CM

## 2021-06-23 DIAGNOSIS — I340 Nonrheumatic mitral (valve) insufficiency: Secondary | ICD-10-CM

## 2021-06-23 DIAGNOSIS — I700 Atherosclerosis of aorta: Secondary | ICD-10-CM | POA: Diagnosis not present

## 2021-06-23 DIAGNOSIS — E210 Primary hyperparathyroidism: Secondary | ICD-10-CM | POA: Diagnosis not present

## 2021-06-23 DIAGNOSIS — D631 Anemia in chronic kidney disease: Secondary | ICD-10-CM

## 2021-06-23 DIAGNOSIS — F321 Major depressive disorder, single episode, moderate: Secondary | ICD-10-CM | POA: Diagnosis not present

## 2021-06-23 DIAGNOSIS — I152 Hypertension secondary to endocrine disorders: Secondary | ICD-10-CM | POA: Diagnosis not present

## 2021-06-23 DIAGNOSIS — I480 Paroxysmal atrial fibrillation: Secondary | ICD-10-CM

## 2021-06-23 DIAGNOSIS — N184 Chronic kidney disease, stage 4 (severe): Secondary | ICD-10-CM | POA: Diagnosis not present

## 2021-06-23 DIAGNOSIS — R0609 Other forms of dyspnea: Secondary | ICD-10-CM

## 2021-06-23 DIAGNOSIS — E1159 Type 2 diabetes mellitus with other circulatory complications: Secondary | ICD-10-CM

## 2021-06-23 DIAGNOSIS — I517 Cardiomegaly: Secondary | ICD-10-CM

## 2021-06-23 DIAGNOSIS — Z599 Problem related to housing and economic circumstances, unspecified: Secondary | ICD-10-CM

## 2021-06-23 DIAGNOSIS — E785 Hyperlipidemia, unspecified: Secondary | ICD-10-CM

## 2021-06-23 DIAGNOSIS — E1122 Type 2 diabetes mellitus with diabetic chronic kidney disease: Secondary | ICD-10-CM

## 2021-06-23 LAB — MICROALBUMIN, URINE WAIVED
Creatinine, Urine Waived: 50 mg/dL (ref 10–300)
Microalb, Ur Waived: 30 mg/L — ABNORMAL HIGH (ref 0–19)

## 2021-06-23 LAB — BAYER DCA HB A1C WAIVED: HB A1C (BAYER DCA - WAIVED): 8.6 % — ABNORMAL HIGH (ref 4.8–5.6)

## 2021-06-23 MED ORDER — OZEMPIC (0.25 OR 0.5 MG/DOSE) 2 MG/1.5ML ~~LOC~~ SOPN: 0.2500 mg | PEN_INJECTOR | SUBCUTANEOUS | 4 refills | Status: DC

## 2021-06-23 MED ORDER — BUSPIRONE HCL 5 MG PO TABS: 5.0000 mg | ORAL_TABLET | Freq: Two times a day (BID) | ORAL | 4 refills | Status: DC

## 2021-06-23 MED ORDER — EMPAGLIFLOZIN 25 MG PO TABS: 25.0000 mg | ORAL_TABLET | Freq: Every day | ORAL | 4 refills | Status: DC

## 2021-06-23 NOTE — Assessment & Plan Note (Signed)
Chronic, ongoing with BP well below goal today on exam with report occasional postural dizziness.  At this time will continue current regimen and she is aware to stop Metoprolol once complete and switch over to Bisoprolol per pulmonary due to ongoing SOB.  At this time continue remainder of current regimen and collaboration with cardiology -- will discuss with them possibility further reduction in future.  Recommend she monitor BP at least a few mornings a week at home and document.  DASH diet at home.  Labs: up to date with nephrology.  Return in 3 months.

## 2021-06-23 NOTE — Assessment & Plan Note (Signed)
Chronic, ongoing.  Denies SI/HI.  Continue Duloxetine at this time, which also offers benefit to chronic pain and mood.  Add on Buspar 5 MG BID to assist with anxiety, discussed with patient and educated on this.  Refills as needed.  Will adjust dosing or add medication as needed.  Return in 3 months.

## 2021-06-23 NOTE — Assessment & Plan Note (Signed)
Noted on echo June 2022.  Continue collaboration with cardiology and current medication regimen.

## 2021-06-23 NOTE — Telephone Encounter (Signed)
Best contact number is [phone].   +please send eliquis PAP - it is unlikely patient will have spent 3% of OOP income on medications in January but this will get process started and we will just have to submit Rx spending report once she reaches this

## 2021-06-23 NOTE — Assessment & Plan Note (Signed)
Noted on 07/15/20 imaging.  At this time continue statin and Eliquis daily for prevention.

## 2021-06-23 NOTE — Progress Notes (Signed)
BP (!) 119/57    Pulse 66    Temp 98.5 F (36.9 C)    Wt 225 lb 3.2 oz (102.2 kg)    SpO2 96%    BMI 42.55 kg/m    Subjective:    Patient ID: Cindy [name], female    DOB: May 03, 1951, 71 y.o.   MRN: [id number]  HPI: Cindy [name] is a 71 y.o. female  Chief Complaint  Patient presents with   Diabetes    Patient states she has noticed her sugar reading levels have been elevated and the lowest she has seen is 170's and this morning it was 202. Patient states she has also noticed that she has been having the shakes. Patient states she is not sure where that is coming from. Patient states she has been having elevated numbers since her last office visit. Patient denies having a recent Diabetic Eye Exam.    Hyperlipidemia   Hypertension   Atrial Fibrillation   Chronic Kidney Disease   Depression   Fatigue    Patient states she has been extremely tired and think she may be depressed.    DIABETES A1c in October 7.4%.  Taking Metformin and Jardiance only at this time -- tried to add Ozempic in past but cost was issue.  Working with CCM team on cost assist with Jardiance and Eliquis. Hypoglycemic episodes:no Polydipsia/polyuria: no Visual disturbance: no Chest pain: no Paresthesias: no Glucose Monitoring: yes             Accucheck frequency: Daily             Fasting glucose: 170 - 202             Post prandial:             Evening:             Before meals: Taking Insulin?: no             Long acting insulin:             Short acting insulin: Blood Pressure Monitoring: a few times a week Retinal Examination: Up to Date -- [facility] Foot Exam: Up to Date  Diabetic Education: Completed in past Pneumovax: Given Today Influenza: Not up to Date - refuses Aspirin: no    HYPERTENSION / HYPERLIPIDEMIA/A-FIB Followed by cardiology, last 01/14/21. Continues on Eliquis, Atorvastatin, Diltiazem, Lasix, Olmesartan, Metoprolol, Spironolactone, and Minoxidil.  Uses CPAP every night.    Had echo 11/26/20 with EF 60%.  Aortic atherosclerosis noted on 07/15/20 imaging.    Has seen pulmonary, Dr. Melvyn Novas, 01/13/21 due to ongoing SOB post Covid in February 2022 == stopped her ACE and changed to ARB (Olmesartan) + recommended if ongoing issues then next step would be to change to Bisoprolol 10 MG BID vs Metoprolol.  She was to return in 6 weeks -- but did not.  She continues to have shortness of breath, even with medication change -- recommend she start Bisoprolol and stop Metoprolol, which she has not yet.  Denies any coughing.  SOB continues with short distances, uses Albuterol at times which offers benefit. Satisfied with current treatment? yes Duration of hypertension: chronic BP monitoring frequency: a few times a week BP range: 105 to 120/60-70 BP medication side effects: no Duration of hyperlipidemia: chronic Cholesterol medication side effects: no Cholesterol supplements: none Medication compliance: good compliance Aspirin: no Recent stressors: no Recurrent headaches: no Visual changes: no Palpitations: no Dyspnea: ongoing with no worsening Chest  pain: no Lower extremity edema: no Dizzy/lightheaded: with position changes  CHRONIC KIDNEY DISEASE Labs on 04/22/21 with nephrology = CRT 1.88, GFR 28, PTH 192, H/H 10.4/32.7. CKD status: controlled Medications renally dose:  discussed with patient Previous renal evaluation: no Pneumovax:  Up To Date Influenza Vaccine:  Not up to Date --refuses  DEPRESSION Continues on Duloxetine daily.  Does endorse having lots of anxiety with family situations and bills, does pray a lot.  Is having some increased fatigue which is ongoing. Mood status: stable Satisfied with current treatment?: yes Symptom severity: moderate  Duration of current treatment : chronic Side effects: no Medication compliance: good compliance Psychotherapy/counseling: none Depressed mood: yes Anxious mood: no Anhedonia: no Significant weight loss or  gain: no Insomnia: none Fatigue: no Feelings of worthlessness or guilt: no Impaired concentration/indecisiveness: no Suicidal ideations: no Hopelessness: no Crying spells: no Depression screen [facility] 2/9 06/23/2021 03/23/2021 01/19/2021 12/03/2020 12/03/2020  Decreased Interest 3 2 0 1 1  Down, Depressed, Hopeless 2 2 0 1 1  PHQ - 2 Score 5 4 0 2 2  Altered sleeping 0 2 - 2 2  Tired, decreased energy 3 2 - 1 2  Change in appetite 1 2 - 2 -  Feeling bad or failure about yourself  3 2 - 0 0  Trouble concentrating 3 2 - 0 0  Moving slowly or fidgety/restless 2 0 - 0 0  Suicidal thoughts 0 0 - 0 0  PHQ-9 Score 17 14 - 7 6  Difficult doing work/chores Somewhat difficult Somewhat difficult - Somewhat difficult Somewhat difficult    GAD 7 : Generalized Anxiety Score 06/23/2021 03/23/2021  Nervous, Anxious, on Edge 3 2  Control/stop worrying 3 2  Worry too much - different things 3 2  Trouble relaxing 3 2  Restless 3 2  Easily annoyed or irritable 3 2  Afraid - awful might happen 3 2  Total GAD 7 Score 21 14  Anxiety Difficulty - Very difficult    Relevant past medical, surgical, family and social history reviewed and updated as indicated. Interim medical history since our last visit reviewed. Allergies and medications reviewed and updated.  Review of Systems  Constitutional:  Positive for fatigue. Negative for activity change, appetite change, diaphoresis and fever.  Respiratory:  Positive for shortness of breath. Negative for cough, chest tightness and wheezing.   Cardiovascular:  Negative for chest pain, palpitations and leg swelling.  Gastrointestinal: Negative.   Endocrine: Negative for cold intolerance, heat intolerance, polydipsia, polyphagia and polyuria.  Neurological: Negative.   Psychiatric/Behavioral: Negative.     Per HPI unless specifically indicated above     Objective:    BP (!) 119/57    Pulse 66    Temp 98.5 F (36.9 C)    Wt 225 lb 3.2 oz (102.2 kg)    SpO2 96%     BMI 42.55 kg/m   Wt Readings from Last 3 Encounters:  06/23/21 225 lb 3.2 oz (102.2 kg)  03/23/21 232 lb 9.6 oz (105.5 kg)  01/19/21 242 lb (109.8 kg)    Physical Exam Vitals and nursing note reviewed.  Constitutional:      General: She is awake. She is not in acute distress.    Appearance: She is well-developed and well-groomed. She is morbidly obese. She is not ill-appearing or toxic-appearing.  HENT:     Head: Normocephalic.     Right Ear: Hearing normal.     Left Ear: Hearing normal.  Eyes:  General: Lids are normal.        Right eye: No discharge.        Left eye: No discharge.     Conjunctiva/sclera: Conjunctivae normal.     Pupils: Pupils are equal, round, and reactive to light.  Neck:     Thyroid: No thyromegaly.     Vascular: No carotid bruit.  Cardiovascular:     Rate and Rhythm: Normal rate and regular rhythm.     Heart sounds: Murmur heard.  Systolic murmur is present with a grade of 2/6.    No gallop.  Pulmonary:     Effort: Pulmonary effort is normal. No accessory muscle usage or respiratory distress.     Breath sounds: No decreased breath sounds or wheezing.  Abdominal:     General: Bowel sounds are normal. There is no distension.     Palpations: Abdomen is soft.  Musculoskeletal:     Cervical back: Normal range of motion and neck supple.     Right lower leg: No edema.     Left lower leg: No edema.  Lymphadenopathy:     Cervical: No cervical adenopathy.  Skin:    General: Skin is warm and dry.  Neurological:     Mental Status: She is alert and oriented to person, place, and time.     Deep Tendon Reflexes: Reflexes are normal and symmetric.     Reflex Scores:      Brachioradialis reflexes are 2+ on the right side and 2+ on the left side.      Patellar reflexes are 2+ on the right side and 2+ on the left side. Psychiatric:        Attention and Perception: Attention normal.        Mood and Affect: Mood normal.        Speech: Speech normal.         Behavior: Behavior normal. Behavior is cooperative.        Thought Content: Thought content normal.   Results for orders placed or performed in visit on 03/23/21  Bayer DCA Hb A1c Waived  Result Value Ref Range   HB A1C (BAYER DCA - WAIVED) 7.4 (H) 4.8 - 5.6 %  CBC with Differential/Platelet  Result Value Ref Range   WBC 5.6 3.4 - 10.8 x10E3/uL   RBC 3.64 (L) 3.77 - 5.28 x10E6/uL   Hemoglobin 10.3 (L) 11.1 - 15.9 g/dL   Hematocrit 32.2 (L) 34.0 - 46.6 %   MCV 89 79 - 97 fL   MCH 28.3 26.6 - 33.0 pg   MCHC 32.0 31.5 - 35.7 g/dL   RDW 13.6 11.7 - 15.4 %   Platelets 263 150 - 450 x10E3/uL   Neutrophils 62 Not Estab. %   Lymphs 27 Not Estab. %   Monocytes 7 Not Estab. %   Eos 2 Not Estab. %   Basos 1 Not Estab. %   Neutrophils Absolute 3.5 1.4 - 7.0 x10E3/uL   Lymphocytes Absolute 1.5 0.7 - 3.1 x10E3/uL   Monocytes Absolute 0.4 0.1 - 0.9 x10E3/uL   EOS (ABSOLUTE) 0.1 0.0 - 0.4 x10E3/uL   Basophils Absolute 0.0 0.0 - 0.2 x10E3/uL   Immature Granulocytes 1 Not Estab. %   Immature Grans (Abs) 0.0 0.0 - 0.1 x10E3/uL  Comprehensive metabolic panel  Result Value Ref Range   Glucose 187 (H) 70 - 99 mg/dL   BUN 23 8 - 27 mg/dL   Creatinine, Ser 1.70 (H) 0.57 - 1.00 mg/dL   eGFR  32 (L) >59 mL/min/1.73   BUN/Creatinine Ratio 14 12 - 28   Sodium 138 134 - 144 mmol/L   Potassium 4.9 3.5 - 5.2 mmol/L   Chloride 101 96 - 106 mmol/L   CO2 22 20 - 29 mmol/L   Calcium 9.3 8.7 - 10.3 mg/dL   Total Protein 7.2 6.0 - 8.5 g/dL   Albumin 4.4 3.7 - 4.7 g/dL   Globulin, Total 2.8 1.5 - 4.5 g/dL   Albumin/Globulin Ratio 1.6 1.2 - 2.2   Bilirubin Total 0.3 0.0 - 1.2 mg/dL   Alkaline Phosphatase 93 44 - 121 IU/L   AST 16 0 - 40 IU/L   ALT 14 0 - 32 IU/L  Hepatitis C antibody  Result Value Ref Range   Hep C Virus Ab <0.1 0.0 - 0.9 s/co ratio  Magnesium  Result Value Ref Range   Magnesium 2.4 (H) 1.6 - 2.3 mg/dL      Assessment & Plan:   Problem List Items Addressed This Visit        Cardiovascular and Mediastinum   Aortic atherosclerosis ([place])    Noted on 07/15/20 imaging.  At this time continue statin and Eliquis daily for prevention.        Hypertension associated with diabetes ([place])    Chronic, ongoing with BP well below goal today on exam with report occasional postural dizziness.  At this time will continue current regimen and she is aware to stop Metoprolol once complete and switch over to Bisoprolol per pulmonary due to ongoing SOB.  At this time continue remainder of current regimen and collaboration with cardiology -- will discuss with them possibility further reduction in future.  Recommend she monitor BP at least a few mornings a week at home and document.  DASH diet at home.  Labs: up to date with nephrology.  Return in 3 months.       Relevant Medications   Semaglutide,0.25 or 0.5MG/DOS, (OZEMPIC, 0.25 OR 0.5 MG/DOSE,) 2 MG/1.5ML SOPN   empagliflozin (JARDIANCE) 25 MG TABS tablet   Other Relevant Orders   Bayer DCA Hb A1c Waived   Microalbumin, Urine Waived   Left ventricular hypertrophy    Noted on echo June 2022.  Continue collaboration with cardiology and current medication regimen.      Mitral regurgitation    Mild mitral and tricuspid regurgitation noted on echo June 2022.  Continue collaboration with cardiology and current medication regimen.      Paroxysmal atrial fibrillation (HCC)    Chronic, ongoing, followed by cardiology.  Continue Eliquis and Metoprolol + collaboration with cardiology team.  Rate controlled on exam today.  Continue to collaborate with CCM team.      Relevant Orders   VITAMIN D 25 Hydroxy (Vit-D Deficiency, Fractures)     Endocrine   CKD stage 4 due to type 2 diabetes mellitus ([place])    Ongoing, chronic.  At this time reduce and renal dose medications as needed + continue collaboration with nephrology.  Labs up to date with nephrology.  Avoid NSAIDs.      Relevant Medications   Semaglutide,0.25 or 0.5MG/DOS, (OZEMPIC,  0.25 OR 0.5 MG/DOSE,) 2 MG/1.5ML SOPN   empagliflozin (JARDIANCE) 25 MG TABS tablet   Other Relevant Orders   Bayer DCA Hb A1c Waived   Microalbumin, Urine Waived   Hyperlipidemia associated with type 2 diabetes mellitus (HCC)    Chronic, ongoing.  Continue current medication regimen and adjust as needed.  Lipid panel today.  Relevant Medications   Semaglutide,0.25 or 0.5MG/DOS, (OZEMPIC, 0.25 OR 0.5 MG/DOSE,) 2 MG/1.5ML SOPN   empagliflozin (JARDIANCE) 25 MG TABS tablet   Other Relevant Orders   Bayer DCA Hb A1c Waived   Lipid Panel w/o Chol/HDL Ratio   Primary hyperparathyroidism (HCC)    Chronic, ongoing.  Continue collaboration with nephrology and current medication regimen.      Type 2 diabetes mellitus with morbid obesity ([place]) - Primary    Chronic, ongoing with A1c 8.6% today, upward trend and urine ALB 30.   - Continue Jardiance, but increase to 25 MG -- Jardiance obtained via assistance with CCM -- will work on increased dosing and contacted [name] - Stop Metformin as recent eGFR <30, will start Ozempic again, which she tolerated well on sample in past and showed benefit in lowering BS -- sample provided in office today for one month supply and will work with CCM team on coverage for this or Trulicity. No family or personal h/o thyroid cancer. - Maintain off Glimepiride due to her age and risk for hypoglycemia. - If ongoing poor control once at MAX doses of GLP1 and SGLT2, then will consider referral to endo, but at this time will continue to work on this in office which patient agrees with. - Check BS TID and focus on diabetic diet.    - Return in 4 weeks for BS check -- but alert provider prior to if elevations in BS >300 or <70.        Relevant Medications   Semaglutide,0.25 or 0.5MG/DOS, (OZEMPIC, 0.25 OR 0.5 MG/DOSE,) 2 MG/1.5ML SOPN   empagliflozin (JARDIANCE) 25 MG TABS tablet   Other Relevant Orders   Bayer DCA Hb A1c Waived     Genitourinary   Anemia in  chronic kidney disease (CKD)    Ongoing, noted on labs post Covid.  Labs up to date with nephrology.  Continue this collaboration, recent notes and labs reviewed.        Hematopoietic and Hemostatic   Other thrombophilia ([place])    Related to a-fib and long term Eliquis.  Monitor CBC regularly and monitor skin for bruising or breakdown, notify provider if present.        Other   Depression, major, single episode, moderate (HCC)    Chronic, ongoing.  Denies SI/HI.  Continue Duloxetine at this time, which also offers benefit to chronic pain and mood.  Add on Buspar 5 MG BID to assist with anxiety, discussed with patient and educated on this.  Refills as needed.  Will adjust dosing or add medication as needed.  Return in 3 months.      Relevant Medications   busPIRone (BUSPAR) 5 MG tablet   DOE (dyspnea on exertion)    Had change from ACE to ARB with pulmonary, continues to report ongoing SOB.  At this time recommend she stop Metoprolol as recommended last visit and start Bisoprolol for HR rate control.  Discussed at length with patient and daughter.  Continue to collaborate with cardiology and pulmonary.  Would benefit from modest weight loss for overall health and assist with SOB.      Morbid obesity (HCC)    BMI 42.55 with T2DM, HTN, CKD.  Recommended eating smaller high protein, low fat meals more frequently and exercising 30 mins a day 5 times a week with a goal of 10-15lb weight loss in the next 3 months. Patient voiced their understanding and motivation to adhere to these recommendations.       Relevant  Medications   Semaglutide,0.25 or 0.5MG/DOS, (OZEMPIC, 0.25 OR 0.5 MG/DOSE,) 2 MG/1.5ML SOPN   empagliflozin (JARDIANCE) 25 MG TABS tablet   Other Visit Diagnoses     Need for financial support       Care Team referral placed   Relevant Orders   AMB Referral to [facility]        Follow up plan: Return in about 4 weeks (around 07/21/2021) for Diabetes -- added  Ozempic and Jardiance increased.

## 2021-06-23 NOTE — Assessment & Plan Note (Signed)
Mild mitral and tricuspid regurgitation noted on echo June 2022.  Continue collaboration with cardiology and current medication regimen. 

## 2021-06-23 NOTE — Assessment & Plan Note (Signed)
Ongoing, noted on labs post Covid.  Labs up to date with nephrology.  Continue this collaboration, recent notes and labs reviewed.

## 2021-06-23 NOTE — Assessment & Plan Note (Signed)
Ongoing, chronic.  At this time reduce and renal dose medications as needed + continue collaboration with nephrology.  Labs up to date with nephrology.  Avoid NSAIDs.

## 2021-06-23 NOTE — Assessment & Plan Note (Signed)
BMI 42.55 with T2DM, HTN, CKD.  Recommended eating smaller high protein, low fat meals more frequently and exercising 30 mins a day 5 times a week with a goal of 10-15lb weight loss in the next 3 months. Patient voiced their understanding and motivation to adhere to these recommendations.

## 2021-06-23 NOTE — Assessment & Plan Note (Signed)
Chronic, ongoing, followed by cardiology.  Continue Eliquis and Metoprolol + collaboration with cardiology team.  Rate controlled on exam today.  Continue to collaborate with CCM team.

## 2021-06-23 NOTE — Assessment & Plan Note (Signed)
Chronic, ongoing.  Continue collaboration with nephrology and current medication regimen.

## 2021-06-23 NOTE — Assessment & Plan Note (Signed)
Related to a-fib and long term Eliquis.  Monitor CBC regularly and monitor skin for bruising or breakdown, notify provider if present. 

## 2021-06-23 NOTE — Assessment & Plan Note (Signed)
Chronic, ongoing.  Continue current medication regimen and adjust as needed. Lipid panel today. 

## 2021-06-23 NOTE — Telephone Encounter (Signed)
Please coordinate completion of below patient assistance applications with patient. Can send to patient by mail or have her pick up in office.If unable to reach patient, please let me know. Thank you.  BI Cares - Jardiance 25 mg once daily  Novo Cares - Ozempic (1.5 ml pen) inject 0.5 mg into the skin once weekly

## 2021-06-23 NOTE — Assessment & Plan Note (Addendum)
Chronic, ongoing with A1c 8.6% today, upward trend and urine ALB 30.   - Continue Jardiance, but increase to 25 MG -- Jardiance obtained via assistance with CCM -- will work on increased dosing and contacted [name] - Stop Metformin as recent eGFR <30, will start Ozempic again, which she tolerated well on sample in past and showed benefit in lowering BS -- sample provided in office today for one month supply and will work with CCM team on coverage for this or Trulicity. No family or personal h/o thyroid cancer. - Maintain off Glimepiride due to her age and risk for hypoglycemia. - If ongoing poor control once at MAX doses of GLP1 and SGLT2, then will consider referral to endo, but at this time will continue to work on this in office which patient agrees with. - Check BS TID and focus on diabetic diet.    - Return in 4 weeks for BS check -- but alert provider prior to if elevations in BS >300 or <70.

## 2021-06-23 NOTE — Assessment & Plan Note (Signed)
Had change from ACE to ARB with pulmonary, continues to report ongoing SOB.  At this time recommend she stop Metoprolol as recommended last visit and start Bisoprolol for HR rate control.  Discussed at length with patient and daughter.  Continue to collaborate with cardiology and pulmonary.  Would benefit from modest weight loss for overall health and assist with SOB.

## 2021-06-24 ENCOUNTER — Telehealth: Payer: Self-pay

## 2021-06-24 LAB — LIPID PANEL W/O CHOL/HDL RATIO
Cholesterol, Total: 157 mg/dL (ref 100–199)
HDL: 45 mg/dL (ref >39)
LDL Chol Calc (NIH): 89 mg/dL (ref 0–99)
Triglycerides: 126 mg/dL (ref 0–149)
VLDL Cholesterol Cal: 23 mg/dL (ref 5–40)

## 2021-06-24 LAB — VITAMIN D 25 HYDROXY (VIT D DEFICIENCY, FRACTURES): Vit D, 25-Hydroxy: 39.8 ng/mL (ref 30.0–100.0)

## 2021-06-24 NOTE — Telephone Encounter (Signed)
° °  Telephone encounter was:  Successful.  06/24/2021 Name: [name] MRN: [id number] DOB: 11/25/1950  Cindy [name] is a 71 y.o. year old female who is a primary care patient of Cannady, Barbaraann Faster, NP . The community resource team was consulted for assistance with [place] guide performed the following interventions: Spoke with patient about local food pantries. Patient gave permission to submit a referral to [facility]. Patient stated that she did not need assistance with utility bills.  Follow Up Plan:  Care guide will follow up with patient by phone over the next 4-93 days  Todd Jelinski, AAS Paralegal, [facility] [place] [facility]  [address],  [address] ??[email or web address]   ?? [phone]   [email or web address]

## 2021-06-24 NOTE — Progress Notes (Signed)
° ° °  Chronic Care Management Pharmacy Assistant   Name: Cindy [name]  MRN: [id number] DOB: November 26, 1950   Reason for Encounter: Patient Assitance Application for Eliquis, Ozempic, Jardiance    Medications: Outpatient Encounter Medications as of 06/23/2021  Medication Sig   acetaminophen (TYLENOL) 650 MG CR tablet Take 650 mg by mouth every 8 (eight) hours as needed for pain.   albuterol (VENTOLIN HFA) 108 (90 Base) MCG/ACT inhaler Inhale 1-2 puffs into the lungs every 6 (six) hours as needed for wheezing or shortness of breath.   apixaban (ELIQUIS) 5 MG TABS tablet Take 1 tablet (5 mg total) by mouth 2 (two) times daily.   atorvastatin (LIPITOR) 40 MG tablet Take 40 mg by mouth daily.   bisoprolol (ZEBETA) 10 MG tablet Take 1 tablet (10 mg total) by mouth 2 (two) times daily.   busPIRone (BUSPAR) 5 MG tablet Take 1 tablet (5 mg total) by mouth 2 (two) times daily.   cetirizine (ZYRTEC) 10 MG tablet Take 10 mg by mouth daily.   diltiazem (CARDIZEM CD) 360 MG 24 hr capsule Take 360 mg by mouth daily.   DULoxetine (CYMBALTA) 60 MG capsule Take 60 mg by mouth daily.   empagliflozin (JARDIANCE) 25 MG TABS tablet Take 1 tablet (25 mg total) by mouth daily before breakfast.   ferrous sulfate 325 (65 FE) MG tablet Take 1 tablet (325 mg total) by mouth 2 (two) times daily with a meal.   fluticasone (FLONASE) 50 MCG/ACT nasal spray Place 2 sprays into both nostrils daily.   furosemide (LASIX) 80 MG tablet TAKE ONE TABLET BY MOUTH TWICE DAILY FOR FLUID   minoxidil (LONITEN) 10 MG tablet TAKE ONE TABLET BY MOUTH ONCE DAILY FOR BLOOD PRESSURE   montelukast (SINGULAIR) 10 MG tablet Take 1 tablet (10 mg total) by mouth at bedtime.   Multiple Vitamin (MULTIVITAMIN WITH MINERALS) TABS tablet Take 1 tablet by mouth daily.   olmesartan (BENICAR) 20 MG tablet Take 1 tablet (20 mg total) by mouth daily.   omeprazole (PRILOSEC) 20 MG capsule Take 20 mg by mouth daily as needed (heartburn).    polyethylene  glycol powder (GLYCOLAX/MIRALAX) powder Take 17 g by mouth daily.    Semaglutide,0.25 or 0.5MG /DOS, (OZEMPIC, 0.25 OR 0.5 MG/DOSE,) 2 MG/1.5ML SOPN Inject 0.25 mg into the skin once a week. Start with 0.25MG  once a week x 4 weeks, then increase to 0.5MG  weekly.   spironolactone (ALDACTONE) 25 MG tablet Take 1 tablet (25 mg total) by mouth daily.   No facility-administered encounter medications on file as of 06/23/2021.    I have spoke to the patient about her patient assistance applications on her medications. I was very detailed with what she needs to complete. Patient states she would like her applications to be sent out through mail and she will have her daughter help her complete and return back to the office for her PCP signature. I mentioned to her I will be mailing these out to her on Friday 06/26/21, patient is aware of the forms and understands directions. I also left my  phone number so she can call me if she has any issues while she completes the forms.   Corrie Mckusick, [place]

## 2021-06-24 NOTE — Progress Notes (Signed)
Good morning crew, please let Eva know her cholesterol levels and Vitamin D level have returned in good ranges.  Continue all current medications as ordered and if any questions please let me know.  Have a great day!! Keep being amazing!!  Thank you for allowing me to participate in your care.  I appreciate you. Kindest regards, Elsy Chiang

## 2021-06-26 ENCOUNTER — Telehealth: Payer: Self-pay

## 2021-06-26 NOTE — Telephone Encounter (Signed)
° °  Telephone encounter was:  Successful.  06/26/2021 Name: SHAUNIKA ITALIANO MRN: [id number] DOB: 05-06-1951  CHARLANE WESTRY is a 71 y.o. year old female who is a primary care patient of Cannady, Barbaraann Faster, NP . The community resource team was consulted for assistance with Food Insecurity and utilities.  Care guide performed the following interventions: Received NCC[employer]360 message from Mirian Capuchin at [facility].  Patient has been contacted and set up for a food pick up today at 1:00pm today. Patient was given information for [employer] emergency assistance and [facility] DSS utility assistance.  Follow Up Plan:  No further follow up planned at this time. The patient has been provided with needed resources.  Shrihaan Porzio, AAS Paralegal, [facility]  [address], [place] [address] ??[email or web address]   ?? [phone]   [email or web address]

## 2021-07-01 ENCOUNTER — Ambulatory Visit (INDEPENDENT_AMBULATORY_CARE_PROVIDER_SITE_OTHER): Payer: Medicare HMO

## 2021-07-01 ENCOUNTER — Telehealth: Payer: Medicare HMO

## 2021-07-01 DIAGNOSIS — I480 Paroxysmal atrial fibrillation: Secondary | ICD-10-CM

## 2021-07-01 DIAGNOSIS — E1159 Type 2 diabetes mellitus with other circulatory complications: Secondary | ICD-10-CM

## 2021-07-01 DIAGNOSIS — E785 Hyperlipidemia, unspecified: Secondary | ICD-10-CM

## 2021-07-01 DIAGNOSIS — E1169 Type 2 diabetes mellitus with other specified complication: Secondary | ICD-10-CM

## 2021-07-01 DIAGNOSIS — F321 Major depressive disorder, single episode, moderate: Secondary | ICD-10-CM

## 2021-07-01 DIAGNOSIS — E1122 Type 2 diabetes mellitus with diabetic chronic kidney disease: Secondary | ICD-10-CM

## 2021-07-01 DIAGNOSIS — G894 Chronic pain syndrome: Secondary | ICD-10-CM

## 2021-07-01 NOTE — Chronic Care Management (AMB) (Signed)
Chronic Care Management   CCM RN Visit Note  07/01/2021 Name: Cindy [name] MRN: [id number] DOB: Apr 29, 1951  Subjective: Cindy [name] is a 71 y.o. year old female who is a primary care patient of Cannady, Barbaraann Faster, NP. The care management team was consulted for assistance with disease management and care coordination needs.    Engaged with patient by telephone for follow up visit in response to provider referral for case management and/or care coordination services.   Consent to Services:  The patient was given information about Chronic Care Management services, agreed to services, and gave verbal consent prior to initiation of services.  Please see initial visit note for detailed documentation.   Patient agreed to services and verbal consent obtained.   Assessment: Review of patient past medical history, allergies, medications, health status, including review of consultants reports, laboratory and other test data, was performed as part of comprehensive evaluation and provision of chronic care management services.   SDOH (Social Determinants of Health) assessments and interventions performed:    CCM Care Plan  Allergies  Allergen Reactions   Bupropion Other (See Comments)   Gabapentin Palpitations   Pioglitazone Palpitations   Vicodin [Hydrocodone-Acetaminophen] Other (See Comments)    Reaction: racing heart   Vicoprofen [Hydrocodone-Ibuprofen] Other (See Comments)    Reaction: racing heart    Outpatient Encounter Medications as of 07/01/2021  Medication Sig   acetaminophen (TYLENOL) 650 MG CR tablet Take 650 mg by mouth every 8 (eight) hours as needed for pain.   albuterol (VENTOLIN HFA) 108 (90 Base) MCG/ACT inhaler Inhale 1-2 puffs into the lungs every 6 (six) hours as needed for wheezing or shortness of breath.   apixaban (ELIQUIS) 5 MG TABS tablet Take 1 tablet (5 mg total) by mouth 2 (two) times daily.   atorvastatin (LIPITOR) 40 MG tablet Take 40 mg by mouth daily.    bisoprolol (ZEBETA) 10 MG tablet Take 1 tablet (10 mg total) by mouth 2 (two) times daily.   busPIRone (BUSPAR) 5 MG tablet Take 1 tablet (5 mg total) by mouth 2 (two) times daily.   cetirizine (ZYRTEC) 10 MG tablet Take 10 mg by mouth daily.   diltiazem (CARDIZEM CD) 360 MG 24 hr capsule Take 360 mg by mouth daily.   DULoxetine (CYMBALTA) 60 MG capsule Take 60 mg by mouth daily.   empagliflozin (JARDIANCE) 25 MG TABS tablet Take 1 tablet (25 mg total) by mouth daily before breakfast.   ferrous sulfate 325 (65 FE) MG tablet Take 1 tablet (325 mg total) by mouth 2 (two) times daily with a meal.   fluticasone (FLONASE) 50 MCG/ACT nasal spray Place 2 sprays into both nostrils daily.   furosemide (LASIX) 80 MG tablet TAKE ONE TABLET BY MOUTH TWICE DAILY FOR FLUID   minoxidil (LONITEN) 10 MG tablet TAKE ONE TABLET BY MOUTH ONCE DAILY FOR BLOOD PRESSURE   montelukast (SINGULAIR) 10 MG tablet Take 1 tablet (10 mg total) by mouth at bedtime.   Multiple Vitamin (MULTIVITAMIN WITH MINERALS) TABS tablet Take 1 tablet by mouth daily.   olmesartan (BENICAR) 20 MG tablet Take 1 tablet (20 mg total) by mouth daily.   omeprazole (PRILOSEC) 20 MG capsule Take 20 mg by mouth daily as needed (heartburn).    polyethylene glycol powder (GLYCOLAX/MIRALAX) powder Take 17 g by mouth daily.    Semaglutide,0.25 or 0.5MG/DOS, (OZEMPIC, 0.25 OR 0.5 MG/DOSE,) 2 MG/1.5ML SOPN Inject 0.25 mg into the skin once a week. Start with 0.25MG once a week  x 4 weeks, then increase to 0.5MG weekly.   spironolactone (ALDACTONE) 25 MG tablet Take 1 tablet (25 mg total) by mouth daily.   No facility-administered encounter medications on file as of 07/01/2021.    Patient Active Problem List   Diagnosis Date Noted   Left ventricular hypertrophy 06/21/2021   Primary hyperparathyroidism ([place]) 03/14/2021   DOE (dyspnea on exertion) 01/13/2021   Post-COVID syndrome 12/03/2020   Recurrent boils 12/03/2020   Morbid obesity ([place]) 12/03/2020    CKD stage 4 due to type 2 diabetes mellitus ([place]) 12/01/2020   Aortic atherosclerosis ([place]) 12/01/2020   Hypertension associated with diabetes ([place]) 07/15/2020   Paroxysmal atrial fibrillation ([place]) 07/15/2020   Chronic pain syndrome 07/15/2020   Depression, major, single episode, moderate ([place]) 07/15/2020   Type 2 diabetes mellitus with morbid obesity ([place]) 07/15/2020   GERD (gastroesophageal reflux disease) 07/15/2020   Hyperlipidemia associated with type 2 diabetes mellitus ([place]) 07/15/2020   OSA (obstructive sleep apnea) 07/15/2020   Other thrombophilia ([place]) 03/12/2020   History of lumbar spinal fusion 04/26/2018   Diuretic-induced hypokalemia 03/24/2018   Hypertensive retinopathy of both eyes 06/08/2017   DDD (degenerative disc disease), lumbar 10/31/2014   Cervical spondylosis with myelopathy 02/22/2014   Anemia in chronic kidney disease (CKD) 02/22/2014   Mitral regurgitation 09/08/2013    Conditions to be addressed/monitored:Atrial Fibrillation, HTN, HLD, DMII, CKD Stage 4, Depression, and chronic pain   Care Plan : RNCM: General plan of care for AFIB, HTN, HLD, CKD4, DM, Chronic pain, and Depression  Updates made by Vanita Ingles, RN since 07/01/2021 12:00 AM     Problem: RNCM: General plan of care for AFIB, HTN, HLD, CKD4, DM, Chronic pain, and Depression   Priority: High     Long-Range Goal: RNCM: General plan of care for AFIB, HTN, HLD, CKD4, DM, Chronic pain, and Depression   Start Date: 12/24/2020  Expected End Date: 12/24/2021  Recent Progress: On track  Priority: High  Note:   Current Barriers:  Knowledge Deficits related to plan of care for management of Atrial Fibrillation, HTN, HLD, DMII, CKD Stage 4, Depression, and chronic pain   Care Coordination needs related to Financial constraints related to difficulty in affording medications , Level of care concerns, and Mental Health Concerns  in a patient with Atrial Fibrillation, HTN, HLD, DMII, CKD Stage 4,  Depression, and chronic pain  Chronic Disease Management support and education needs related to Atrial Fibrillation, HTN, HLD, DMII, CKD Stage 4, Depression, and chronic pain  Difficulty obtaining medications  RNCM Clinical Goal(s):  Patient will verbalize understanding of plan for management of Atrial Fibrillation, HTN, HLD, DMII, CKD Stage 4, Depression, and chronic pain  work with All embedded care coordination to address needs related to Atrial Fibrillation, HTN, HLD, DMII, CKD Stage 4, Depression, and chronic pain  and Financial constraints related to medication cost  and Mental Health Concerns  take all medications exactly as prescribed and will call provider for medication related questions attend all scheduled medical appointments: 07-23-2021 demonstrate a decrease in Atrial Fibrillation, HTN, HLD, DMII, CKD Stage 4, Depression, and Chronic pain  exacerbations demonstrate improved adherence to prescribed treatment plan for Atrial Fibrillation, HTN, HLD, DMII, CKD Stage 4, Depression, and chronic pain  demonstrate improved health management independence verbalize basic understanding of Atrial Fibrillation, HTN, HLD, DMII, CKD Stage 4, Depression, and Chronic pain  disease process and self health management plan demonstrate understanding of rationale for each prescribed medication work with CM clinical social worker  to help with building coping skills and handle stress over multiple chronic conditions  demonstrate ongoing self health care management ability through collaboration with RN Care manager, provider, and care team.   Interventions: 1:1 collaboration with primary care provider regarding development and update of comprehensive plan of care as evidenced by provider attestation and co-signature Inter-disciplinary care team collaboration (see longitudinal plan of care)   A-fib:  (Status: Goal on track: YES.) Counseled on increased risk of stroke due to Afib and benefits of  anticoagulation for stroke prevention; Reviewed importance of adherence to anticoagulant exactly as prescribed. 01-27-2021: the patient was approved for help with Eliquis until the end of the year. The patient is compliant with medications and is happy that she has received help for the Eliquis as she was in the donut hole. She states getting help with the medication has helped a whole lot. 07-01-2021: The patient is compliant with medications. Has her medications.  Advised patient to discuss changes in AFIB, concerns or questions  with provider; Counseled on bleeding risk associated with Eliquis and importance of self-monitoring for signs/symptoms of bleeding; Counseled on avoidance of NSAIDs due to increased bleeding risk with anticoagulants. 07-01-2021: Reviewed again for the patient to avoid NSAIDS; Counseled on importance of regular laboratory monitoring as prescribed; Counseled on seeking medical attention after a head injury or if there is blood in the urine/stool; Afib action plan reviewed. 07-01-2021: The patient states that her AFIB is stable. Denies any acute findings with her AFIB or heart health at this time;  Diabetes:  (Status: Goal on track: YES.) Lab Results  Component Value Date   HGBA1C 8.6 (H) 06/23/2021  03-23-2021- 7.4 Assessed patient's understanding of A1c goal: <7% Provided education to patient about basic DM disease process; Reviewed medications with patient and discussed importance of medication adherence. 01-27-2021: The patient states that she is taking the Jardiance and ozempic as directed. She has been approved for help with the Jardiance until the end of the year. 03-13-2021: The patient states that she is no longer taking the Ozempic, after finishing the samples the patient did not know that she was supposed to continue. Advised the patient that she had refills at [facility]. The patient states she is staking the Metformin and Jardiance but not the Ozempic. Advised the patient  to call the pharmacy and inquire about the Ozempic. The patient also has an appointment in the office to see the pcp on 03-18-2021 and advised the patient to discuss this with the pcp. Will collaborate with the pcp and pharm D on plan of care for DM and the patient wanting to know if she is supposed to be taking all the medications for her DM. 07-01-2021: The patient is taking her Jardiance at 25 mg and has restarted the sample of Ozempic. She has taken 2 weeks worth. Working with the pharm D for assistance with Ozempic and getting Ozempic through medication financial assistance.  Counseled on importance of regular laboratory monitoring as prescribed. 01-27-2021: The patient likely will have new lab work in October. 03-13-2021: Review of most recent lab work and A1C. Education on the goal of A1C being less than 7.0. 03-31-2021: Education on A1C being 7.4% with goal of 7.0. 07-01-2021: On 06-23-2021 A1C increased to 8.6%. The patient is frustrated that her A1C and blood sugars are elevated. Emotional support and education given. Encouraged the patient to talk about her frustrations with the [facility]. The patient has had medication changes and knows that she will see positive  changes.  Discussed plans with patient for ongoing care management follow up and provided patient with direct contact information for care management team; Provided patient with written educational materials related to hypo and hyperglycemia and importance of correct treatment. 07-01-2021: Review of blood sugars. Denies any lows or highs. Reminded the patient to call the office for blood sugars <70 and >300; Reviewed scheduled/upcoming provider appointments including: 07-23-2021 Advised patient, providing education and rationale, to check cbg bid and record, calling pcp for findings outside established parameters. 01-27-2021: The patient states that her blood sugar this am was 189.  The patient states it has been going up and down. She has started taking  the Jardiance again. Education provided that blood sugars should stabilize with taking Jardiance and Ozempic.  Education on continuing to check blood sugars and call for extreme changes in blood sugar readings. 03-13-2021: The patient states that her blood sugars this am was 189. The patient states that she knows if it gets down to 100 it is too low for her. Education on fasting blood sugar goal of <130 and post prandial of <180.  The patient states she takes her blood sugars are various time and if she feels "funny" or different.  Education and support given. 03-31-2021: The patient states that her blood sugar this am was 189 and at lunch it was 242. Education provided on goals of blood sugar to be <130 fasting and <180 2 hours after eating. The patient verbalized understanding. 07-01-2021: The patient admits she is frustrated because her blood sugars are 197 to 260. She states she is taking her medications like she is supposed to. Reminded the patient of the time it takes for medications to get in her system and to work on taking as directed and dietary changes. Review of fasting goal of <130 and <180 post prandial. Reflective listening and support given.  Referral made to pharmacy team for assistance with obtaining financial assistance with Ozempic and other medications, patient spoke to pharmacist on 12-23-2020. 01-27-2021: Working with the pharmacy team for assistance with medications. 03-13-2021: Collaboration with the pharm D concerning assistance with medications compliance and treatment plan of DM ; 03-31-2021: In basket message being sent to pharm D for assistance with Jardiance question . The patient has about 2 weeks worth. 07-01-2021: The patient is currently working with the pharm D for assistance with medications cost.  Review of patient status, including review of consultants reports, relevant laboratory and other test results, and medications completed; Advised patient to discuss questions or concern  about her DM  with provider, the patient was upset that she "messed up" her Ozempic dose this am and it was a wasted dose. Reflective listening and support given. Education on how to take [place]. Her daughters came and assisted her and will assist again next week until she is comfortable giving the Ozempic to herself. The patient is a little concerned about the paperwork and feels the insurance will not pay for the Ozempic. Reviewed conversation she had with pharm D on 12-23-2020, per the notes the pharm D is working with the patient to help with financial constraints of medications, including Ozempic. 01-27-2021: the patient is doing well with the Ozempic and is now taking Jardiance again. The patient states she is having boils continue to come up in different places on her body, usually between her breast and buttocks. States she has one on her buttocks now. She has been putting cream from the dermatologist on it and that is helping. Has  went to wound clinic before for an infected boil and was treated successfully. The patient wants to know what to do for reoccurring "boils". Advised the patient to follow previous recommendations from the provider and to discuss this also with the pcp on her visit in October. The patient states the cream does take the soreness out. Review of sx and sx of infection and call the office for new onset of fever, chills, or drainage consistent with infection. The patient states that she has not had any sx and sx of infection. Also advised the patient that regulation of blood sugars should also help with occurrence of boils.  Will continue to monitor for changes. 03-13-2021: The patient states she was not aware that she had refills for Ozempic at the pharmacy. Read the provider notes from July to the patient. The patient states she stopped taking the Ozempic when she ran out of the samples. Advised the patient to call the pharmacy and inquire about the refills for Ozempic. Also advised  the patient that the [facility] would collaborate with the pcp and pharm D about the recommendations for medication management of her DM. The patient is following up with the pcp next week. Will continue to monitor. 03-31-2021: the patient saw the pcp recently and has parameters. Review of the notes with the patient. Will continue to monitor for needs or changes. 07-01-2021: Ongoing support and education given to the patient. Will continue to monitor for changes.  Scheduled eye exam for March of 2023 per patient on outreach today (07-01-2021)  Hyperlipidemia:  (Status: Goal on track: YES.)    Lab Results  Component Value Date   CHOL 157 06/23/2021   HDL 45 06/23/2021   LDLCALC 89 06/23/2021   TRIG 126 06/23/2021       Medication review performed; medication list updated in electronic medical record. Takes Lipitor 40 mg for HLD control  Provider established cholesterol goals reviewed. 07-01-2021: The patient is at goal with her cholesterol levels. Praised for good control; Counseled on importance of regular laboratory monitoring as prescribed. 07-01-2021: Has regular lab testing Provided HLD educational materials; Reviewed role and benefits of statin for ASCVD risk reduction; Discussed strategies to manage statin-induced myalgias; Reviewed importance of limiting foods high in cholesterol 07-01-2021: the patient endorses compliance with heart healthy/ADA diet. Did get some food assistance with the help of the care guides. Was appreciative for the food she received.   Hypertension: (Status: Goal on track: YES.) Last practice recorded BP readings:  BP Readings from Last 3 Encounters:  06/23/21 (!) 119/57  03/23/21 (!) 108/52  01/13/21 120/72  Most recent eGFR/CrCl:  Lab Results  Component Value Date   EGFR 32 (L) 03/23/2021    No components found for: CRCL  Evaluation of current treatment plan related to hypertension self management and patient's adherence to plan as established by provider. The  patient states that sometimes her blood pressure goes lower but states recently her systolic has been 854 to 627 range and diastolic 50 to 60. Says that she does get light headed some times and dizzy but not recently. Discussed changing position slowly and monitoring for orthostatic hypotension. Explained what orthostatic hypotension was and to be safe when changing positions due to the possibility of blood pressure dropping. 01-27-2021: The patient states she is doing well with management of her HTN. Saw cardiologist recently and pulmonary. Lisinopril discontinued and the patient was started on olmesartan 40 mg. The patient states her cough is better. The patient states the  cardiologist may take her off of some of her medications at next visit. States she feels good. 03-13-2021: The patient had ask the LCSW to get the [facility] to call her concerning some questions she had about her blood pressures and it dropping. The patient states that sometimes her blood pressure is 106/55 and she feels like she is dizzy. She states she doesn't know if it is some of the medications she takes because the bottles say may cause dizziness or if it is vertigo which she has had before or her blood pressure dropping. Reviewed in detail about the possibility of orthostatic hypotension and the need to change position slowly because blood pressures could drop lower when changing position if moving too fast. Education to use precaution when going from a lying to a sitting, a sitting to a standing.  Also education on monitoring for sinus changes and possible vertigo. Reminded the patient to be safe and by being dizzy this put her at higher risk of falls. The patient states she is being careful and she does not want to fall. 03-31-2021: The patient saw the pcp recently and medications changes made. The patient states that she is not feeling light headed and dizzy like she was. She feels the medication changes have helped a lot. Review of safety  and changing position slowly. 07-01-2021: The patient is having much better control of blood pressures. Denies any light headedness or dizziness. Will continue to monitor for changes and needs.  Provided education to patient re: stroke prevention, s/s of heart attack and stroke; Reviewed medications with patient and discussed importance of compliance. 07-01-2021: States compliance with medications ; Discussed plans with patient for ongoing care management follow up and provided patient with direct contact information for care management team; Advised patient, providing education and rationale, to monitor blood pressure daily and record, calling PCP for findings outside established parameters;  Reviewed scheduled/upcoming provider appointments including: 07-23-2021 Provided education on prescribed diet heart healthy/ADA diet. 07-01-2020: States she is eating well and is compliant with a heart healthy/ADA diet. She is trying to eat more vegetables and salads;  Discussed complications of poorly controlled blood pressure such as heart disease, stroke, circulatory complications, vision complications, kidney impairment, sexual dysfunction;    CKD4 (Status: Goal on track: YES.) Lab Results  Component Value Date   CREATININE 1.70 (H) 03/23/2021   CREATININE 1.61 (H) 12/03/2020   CREATININE 1.99 (H) 11/19/2020    Evaluation of current treatment plan related to CKD Stage 4 ,  self-management and patient's adherence to plan as established by provider. 07-01-2021: Denies any new concerns with kidney function. Sees specialist on a regular basis. Discussed plans with patient for ongoing care management follow up and provided patient with direct contact information for care management team Evaluation of current treatment plan related to CKD4 and patient's adherence to plan as established by provider. 01-27-2021: The patient is doing well and denies any issues at this time with her kidneys. States she is feeling much better  and thankful her health is better after COVID and hospitalization. Will continue to monitor. 03-31-2021: the patient wanted more information about her CKD4. Education provided so the pateint would understand about her kidney functions. Reminded the patient to stay away from NSAIDS and follow the recommendations of the provider. The patient states she will see the specialist this month. Encouraged the patient to write down questions to ask the specialist at her visit. 07-01-2021: Denies any new concerns at this time.  Advised patient to  call the office for changes in condition, questions or concerns; Provided education to patient re: staying hydrated, eating healthy, pacing activity, keeping follow up with pcp and specialist; Reviewed medications with patient and discussed compliance. 07-01-2021: Is compliant with medications.; Reviewed scheduled/upcoming provider appointments including 07-23-2021 Discussed plans with patient for ongoing care management follow up and provided patient with direct contact information for care management team;   Depression (Status: Goal on track: YES.) Evaluation of current treatment plan related to Depression, Mental Health Concerns  self-management and patient's adherence to plan as established by provider. 07-01-2021: The patient was frustrated today and was able to get her feelings out. She states that she is frustrated because of her blood sugars and then there are problems with their septic tank and she says the insurance will not pay for anything. She says it is going to cost thousands of dollars and they don't have that kind of money. The patient allowed to express her concerns. Empathetic listening and support given.  Discussed plans with patient for ongoing care management follow up and provided patient with direct contact information for care management team Evaluation of current treatment plan related to depression  and patient's adherence to plan as established by  provider. 01-27-2021: The patient is doing well and just celebrated her 70st birthday. Her family took her out on Sunday and she had a good day on her actual birthday yesterday. The patient states she is doing much better and happy that her health is improving. Denies any issues with depression at this time. Will continue to monitor. 03-31-2021: Doing well denies any new concerns with depression. 07-01-2021: Encouraged the patient to talk about her feelings and how being stressed out and worried can negatively impact her health and well being. The patient has a good support system and is thankful for her family, friends and the CCM team. Will continue to work with the patient to help with effective management of her health and well being.  Advised patient to call the office for changes in mood, anxiety, or increased episodes of depression; Provided education to patient re: working with CCM team to effectively manage her depression; Reviewed medications with patient and discussed compliance ; Reviewed scheduled/upcoming provider appointments including: 07-23-2021 Social Work referral for assistance with coping skills and recommendations for effective management of depression. 07-01-2021: Actively working with the [employer] for ongoing support and education; Discussed plans with patient for ongoing care management follow up and provided patient with direct contact information for care management team; Screening for signs and symptoms of depression related to chronic disease state;  Assessed social determinant of health barriers;   Pain:  (Status: Goal on track: YES.) Pain assessment performed. 07-01-2021: Denies any pain at this time. States when she does have pain she uses her Tylenol arthritis Medications reviewed. 07-01-2021: Is compliant with her medications.  Reviewed provider established plan for pain management. 03-31-2021: The patient is using Tylenol arthritis when she needs to for pain. States pain varies daily.  Denies any pain today. Knows what to do for flare ups. States her butt cheek hurts sometimes and she thinks it is from "sciatica". Denies any acute distress. Will continue to monitor. Discussed importance of adherence to all scheduled medical appointments; Counseled on the importance of reporting any/all new or changed pain symptoms or management strategies to pain management provider; Advised patient to report to care team affect of pain on daily activities; Discussed use of relaxation techniques and/or diversional activities to assist with pain reduction (distraction,  imagery, relaxation, massage, acupressure, TENS, heat, and cold application. 03-31-2021: Education on using ted hose or compression socks to help with any neuropathy pain and elevate legs when sitting down.  Reviewed with patient prescribed pharmacological and nonpharmacological pain relief strategies; Advised patient to discuss increased level or intensity of pain or uncontrolled pain  with provider;  Patient Goals/Self-Care Activities: Patient will self administer medications as prescribed Patient will attend all scheduled provider appointments Patient will call pharmacy for medication refills Patient will attend church or other social activities Patient will continue to perform ADL's independently Patient will continue to perform IADL's independently Patient will call provider office for new concerns or questions       Plan:Telephone follow up appointment with care management team member scheduled for:  08-25-2021 at 230 pm  [place], MSN, [facility] Mobile: [phone]

## 2021-07-01 NOTE — Patient Instructions (Signed)
Visit Information  Thank you for taking time to visit with me today. Please don't hesitate to contact me if I can be of assistance to you before our next scheduled telephone appointment.  Following are the goals we discussed today:  RNCM Clinical Goal(s):  Patient will verbalize understanding of plan for management of Atrial Fibrillation, HTN, HLD, DMII, CKD Stage 4, Depression, and chronic pain  work with All embedded care coordination to address needs related to Atrial Fibrillation, HTN, HLD, DMII, CKD Stage 4, Depression, and chronic pain  and Financial constraints related to medication cost  and Mental Health Concerns  take all medications exactly as prescribed and will call provider for medication related questions attend all scheduled medical appointments: 07-23-2021 demonstrate a decrease in Atrial Fibrillation, HTN, HLD, DMII, CKD Stage 4, Depression, and Chronic pain  exacerbations demonstrate improved adherence to prescribed treatment plan for Atrial Fibrillation, HTN, HLD, DMII, CKD Stage 4, Depression, and chronic pain  demonstrate improved health management independence verbalize basic understanding of Atrial Fibrillation, HTN, HLD, DMII, CKD Stage 4, Depression, and Chronic pain  disease process and self health management plan demonstrate understanding of rationale for each prescribed medication work with CM clinical social worker to help with building coping skills and handle stress over multiple chronic conditions  demonstrate ongoing self health care management ability through collaboration with Consulting civil engineer, provider, and care team.    Interventions: 1:1 collaboration with primary care provider regarding development and update of comprehensive plan of care as evidenced by provider attestation and co-signature Inter-disciplinary care team collaboration (see longitudinal plan of care)     A-fib:  (Status: Goal on track: YES.) Counseled on increased risk of stroke due to Afib  and benefits of anticoagulation for stroke prevention; Reviewed importance of adherence to anticoagulant exactly as prescribed. 01-27-2021: the patient was approved for help with Eliquis until the end of the year. The patient is compliant with medications and is happy that she has received help for the Eliquis as she was in the donut hole. She states getting help with the medication has helped a whole lot. 07-01-2021: The patient is compliant with medications. Has her medications.  Advised patient to discuss changes in AFIB, concerns or questions  with provider; Counseled on bleeding risk associated with Eliquis and importance of self-monitoring for signs/symptoms of bleeding; Counseled on avoidance of NSAIDs due to increased bleeding risk with anticoagulants. 07-01-2021: Reviewed again for the patient to avoid NSAIDS; Counseled on importance of regular laboratory monitoring as prescribed; Counseled on seeking medical attention after a head injury or if there is blood in the urine/stool; Afib action plan reviewed. 07-01-2021: The patient states that her AFIB is stable. Denies any acute findings with her AFIB or heart health at this time;   Diabetes:  (Status: Goal on track: YES.)      Lab Results  Component Value Date    HGBA1C 8.6 (H) 06/23/2021  03-23-2021- 7.4 Assessed patient's understanding of A1c goal: <7% Provided education to patient about basic DM disease process; Reviewed medications with patient and discussed importance of medication adherence. 01-27-2021: The patient states that she is taking the Jardiance and ozempic as directed. She has been approved for help with the Jardiance until the end of the year. 03-13-2021: The patient states that she is no longer taking the Ozempic, after finishing the samples the patient did not know that she was supposed to continue. Advised the patient that she had refills at [facility]. The patient states she is  staking the Metformin and Jardiance but not the  Ozempic. Advised the patient to call the pharmacy and inquire about the Ozempic. The patient also has an appointment in the office to see the pcp on 03-18-2021 and advised the patient to discuss this with the pcp. Will collaborate with the pcp and pharm D on plan of care for DM and the patient wanting to know if she is supposed to be taking all the medications for her DM. 07-01-2021: The patient is taking her Jardiance at 25 mg and has restarted the sample of Ozempic. She has taken 2 weeks worth. Working with the pharm D for assistance with Ozempic and getting Ozempic through medication financial assistance.  Counseled on importance of regular laboratory monitoring as prescribed. 01-27-2021: The patient likely will have new lab work in October. 03-13-2021: Review of most recent lab work and A1C. Education on the goal of A1C being less than 7.0. 03-31-2021: Education on A1C being 7.4% with goal of 7.0. 07-01-2021: On 06-23-2021 A1C increased to 8.6%. The patient is frustrated that her A1C and blood sugars are elevated. Emotional support and education given. Encouraged the patient to talk about her frustrations with the [facility]. The patient has had medication changes and knows that she will see positive changes.  Discussed plans with patient for ongoing care management follow up and provided patient with direct contact information for care management team; Provided patient with written educational materials related to hypo and hyperglycemia and importance of correct treatment. 07-01-2021: Review of blood sugars. Denies any lows or highs. Reminded the patient to call the office for blood sugars <70 and >300; Reviewed scheduled/upcoming provider appointments including: 07-23-2021 Advised patient, providing education and rationale, to check cbg bid and record, calling pcp for findings outside established parameters. 01-27-2021: The patient states that her blood sugar this am was 189.  The patient states it has been going up and  down. She has started taking the Jardiance again. Education provided that blood sugars should stabilize with taking Jardiance and Ozempic.  Education on continuing to check blood sugars and call for extreme changes in blood sugar readings. 03-13-2021: The patient states that her blood sugars this am was 189. The patient states that she knows if it gets down to 100 it is too low for her. Education on fasting blood sugar goal of <130 and post prandial of <180.  The patient states she takes her blood sugars are various time and if she feels "funny" or different.  Education and support given. 03-31-2021: The patient states that her blood sugar this am was 189 and at lunch it was 242. Education provided on goals of blood sugar to be <130 fasting and <180 2 hours after eating. The patient verbalized understanding. 07-01-2021: The patient admits she is frustrated because her blood sugars are 197 to 260. She states she is taking her medications like she is supposed to. Reminded the patient of the time it takes for medications to get in her system and to work on taking as directed and dietary changes. Review of fasting goal of <130 and <180 post prandial. Reflective listening and support given.  Referral made to pharmacy team for assistance with obtaining financial assistance with Ozempic and other medications, patient spoke to pharmacist on 12-23-2020. 01-27-2021: Working with the pharmacy team for assistance with medications. 03-13-2021: Collaboration with the pharm D concerning assistance with medications compliance and treatment plan of DM ; 03-31-2021: In basket message being sent to pharm D for assistance with Vania Rea  question . The patient has about 2 weeks worth. 07-01-2021: The patient is currently working with the pharm D for assistance with medications cost.  Review of patient status, including review of consultants reports, relevant laboratory and other test results, and medications completed; Advised patient to  discuss questions or concern about her DM  with provider, the patient was upset that she "messed up" her Ozempic dose this am and it was a wasted dose. Reflective listening and support given. Education on how to take [place]. Her daughters came and assisted her and will assist again next week until she is comfortable giving the Ozempic to herself. The patient is a little concerned about the paperwork and feels the insurance will not pay for the Ozempic. Reviewed conversation she had with pharm D on 12-23-2020, per the notes the pharm D is working with the patient to help with financial constraints of medications, including Ozempic. 01-27-2021: the patient is doing well with the Ozempic and is now taking Jardiance again. The patient states she is having boils continue to come up in different places on her body, usually between her breast and buttocks. States she has one on her buttocks now. She has been putting cream from the dermatologist on it and that is helping. Has went to wound clinic before for an infected boil and was treated successfully. The patient wants to know what to do for reoccurring "boils". Advised the patient to follow previous recommendations from the provider and to discuss this also with the pcp on her visit in October. The patient states the cream does take the soreness out. Review of sx and sx of infection and call the office for new onset of fever, chills, or drainage consistent with infection. The patient states that she has not had any sx and sx of infection. Also advised the patient that regulation of blood sugars should also help with occurrence of boils.  Will continue to monitor for changes. 03-13-2021: The patient states she was not aware that she had refills for Ozempic at the pharmacy. Read the provider notes from July to the patient. The patient states she stopped taking the Ozempic when she ran out of the samples. Advised the patient to call the pharmacy and inquire about the refills  for Ozempic. Also advised the patient that the [facility] would collaborate with the pcp and pharm D about the recommendations for medication management of her DM. The patient is following up with the pcp next week. Will continue to monitor. 03-31-2021: the patient saw the pcp recently and has parameters. Review of the notes with the patient. Will continue to monitor for needs or changes. 07-01-2021: Ongoing support and education given to the patient. Will continue to monitor for changes.  Scheduled eye exam for March of 2023 per patient on outreach today (07-01-2021)   Hyperlipidemia:  (Status: Goal on track: YES.)            Lab Results  Component Value Date    CHOL 157 06/23/2021    HDL 45 06/23/2021    LDLCALC 89 06/23/2021    TRIG 126 06/23/2021        Medication review performed; medication list updated in electronic medical record. Takes Lipitor 40 mg for HLD control  Provider established cholesterol goals reviewed. 07-01-2021: The patient is at goal with her cholesterol levels. Praised for good control; Counseled on importance of regular laboratory monitoring as prescribed. 07-01-2021: Has regular lab testing Provided HLD educational materials; Reviewed role and benefits of statin for  ASCVD risk reduction; Discussed strategies to manage statin-induced myalgias; Reviewed importance of limiting foods high in cholesterol 07-01-2021: the patient endorses compliance with heart healthy/ADA diet. Did get some food assistance with the help of the care guides. Was appreciative for the food she received.    Hypertension: (Status: Goal on track: YES.) Last practice recorded BP readings:     BP Readings from Last 3 Encounters:  06/23/21 (!) 119/57  03/23/21 (!) 108/52  01/13/21 120/72  Most recent eGFR/CrCl:       Lab Results  Component Value Date    EGFR 32 (L) 03/23/2021    No components found for: CRCL   Evaluation of current treatment plan related to hypertension self management and patient's  adherence to plan as established by provider. The patient states that sometimes her blood pressure goes lower but states recently her systolic has been 338 to 329 range and diastolic 50 to 60. Says that she does get light headed some times and dizzy but not recently. Discussed changing position slowly and monitoring for orthostatic hypotension. Explained what orthostatic hypotension was and to be safe when changing positions due to the possibility of blood pressure dropping. 01-27-2021: The patient states she is doing well with management of her HTN. Saw cardiologist recently and pulmonary. Lisinopril discontinued and the patient was started on olmesartan 40 mg. The patient states her cough is better. The patient states the cardiologist may take her off of some of her medications at next visit. States she feels good. 03-13-2021: The patient had ask the LCSW to get the [facility] to call her concerning some questions she had about her blood pressures and it dropping. The patient states that sometimes her blood pressure is 106/55 and she feels like she is dizzy. She states she doesn't know if it is some of the medications she takes because the bottles say may cause dizziness or if it is vertigo which she has had before or her blood pressure dropping. Reviewed in detail about the possibility of orthostatic hypotension and the need to change position slowly because blood pressures could drop lower when changing position if moving too fast. Education to use precaution when going from a lying to a sitting, a sitting to a standing.  Also education on monitoring for sinus changes and possible vertigo. Reminded the patient to be safe and by being dizzy this put her at higher risk of falls. The patient states she is being careful and she does not want to fall. 03-31-2021: The patient saw the pcp recently and medications changes made. The patient states that she is not feeling light headed and dizzy like she was. She feels the  medication changes have helped a lot. Review of safety and changing position slowly. 07-01-2021: The patient is having much better control of blood pressures. Denies any light headedness or dizziness. Will continue to monitor for changes and needs.  Provided education to patient re: stroke prevention, s/s of heart attack and stroke; Reviewed medications with patient and discussed importance of compliance. 07-01-2021: States compliance with medications ; Discussed plans with patient for ongoing care management follow up and provided patient with direct contact information for care management team; Advised patient, providing education and rationale, to monitor blood pressure daily and record, calling PCP for findings outside established parameters;  Reviewed scheduled/upcoming provider appointments including: 07-23-2021 Provided education on prescribed diet heart healthy/ADA diet. 07-01-2020: States she is eating well and is compliant with a heart healthy/ADA diet. She is trying to eat more  vegetables and salads;  Discussed complications of poorly controlled blood pressure such as heart disease, stroke, circulatory complications, vision complications, kidney impairment, sexual dysfunction;     CKD4 (Status: Goal on track: YES.)      Lab Results  Component Value Date    CREATININE 1.70 (H) 03/23/2021    CREATININE 1.61 (H) 12/03/2020    CREATININE 1.99 (H) 11/19/2020    Evaluation of current treatment plan related to CKD Stage 4 ,  self-management and patient's adherence to plan as established by provider. 07-01-2021: Denies any new concerns with kidney function. Sees specialist on a regular basis. Discussed plans with patient for ongoing care management follow up and provided patient with direct contact information for care management team Evaluation of current treatment plan related to CKD4 and patient's adherence to plan as established by provider. 01-27-2021: The patient is doing well and denies any issues  at this time with her kidneys. States she is feeling much better and thankful her health is better after COVID and hospitalization. Will continue to monitor. 03-31-2021: the patient wanted more information about her CKD4. Education provided so the pateint would understand about her kidney functions. Reminded the patient to stay away from NSAIDS and follow the recommendations of the provider. The patient states she will see the specialist this month. Encouraged the patient to write down questions to ask the specialist at her visit. 07-01-2021: Denies any new concerns at this time.  Advised patient to call the office for changes in condition, questions or concerns; Provided education to patient re: staying hydrated, eating healthy, pacing activity, keeping follow up with pcp and specialist; Reviewed medications with patient and discussed compliance. 07-01-2021: Is compliant with medications.; Reviewed scheduled/upcoming provider appointments including 07-23-2021 Discussed plans with patient for ongoing care management follow up and provided patient with direct contact information for care management team;    Depression (Status: Goal on track: YES.) Evaluation of current treatment plan related to Depression, Mental Health Concerns  self-management and patient's adherence to plan as established by provider. 07-01-2021: The patient was frustrated today and was able to get her feelings out. She states that she is frustrated because of her blood sugars and then there are problems with their septic tank and she says the insurance will not pay for anything. She says it is going to cost thousands of dollars and they don't have that kind of money. The patient allowed to express her concerns. Empathetic listening and support given.  Discussed plans with patient for ongoing care management follow up and provided patient with direct contact information for care management team Evaluation of current treatment plan related to  depression  and patient's adherence to plan as established by provider. 01-27-2021: The patient is doing well and just celebrated her 70st birthday. Her family took her out on Sunday and she had a good day on her actual birthday yesterday. The patient states she is doing much better and happy that her health is improving. Denies any issues with depression at this time. Will continue to monitor. 03-31-2021: Doing well denies any new concerns with depression. 07-01-2021: Encouraged the patient to talk about her feelings and how being stressed out and worried can negatively impact her health and well being. The patient has a good support system and is thankful for her family, friends and the CCM team. Will continue to work with the patient to help with effective management of her health and well being.  Advised patient to call the office for changes in  mood, anxiety, or increased episodes of depression; Provided education to patient re: working with CCM team to effectively manage her depression; Reviewed medications with patient and discussed compliance ; Reviewed scheduled/upcoming provider appointments including: 07-23-2021 Social Work referral for assistance with coping skills and recommendations for effective management of depression. 07-01-2021: Actively working with the [employer] for ongoing support and education; Discussed plans with patient for ongoing care management follow up and provided patient with direct contact information for care management team; Screening for signs and symptoms of depression related to chronic disease state;  Assessed social determinant of health barriers;    Pain:  (Status: Goal on track: YES.) Pain assessment performed. 07-01-2021: Denies any pain at this time. States when she does have pain she uses her Tylenol arthritis Medications reviewed. 07-01-2021: Is compliant with her medications.  Reviewed provider established plan for pain management. 03-31-2021: The patient is using Tylenol  arthritis when she needs to for pain. States pain varies daily. Denies any pain today. Knows what to do for flare ups. States her butt cheek hurts sometimes and she thinks it is from "sciatica". Denies any acute distress. Will continue to monitor. Discussed importance of adherence to all scheduled medical appointments; Counseled on the importance of reporting any/all new or changed pain symptoms or management strategies to pain management provider; Advised patient to report to care team affect of pain on daily activities; Discussed use of relaxation techniques and/or diversional activities to assist with pain reduction (distraction, imagery, relaxation, massage, acupressure, TENS, heat, and cold application. 03-31-2021: Education on using ted hose or compression socks to help with any neuropathy pain and elevate legs when sitting down.  Reviewed with patient prescribed pharmacological and nonpharmacological pain relief strategies; Advised patient to discuss increased level or intensity of pain or uncontrolled pain  with provider;   Patient Goals/Self-Care Activities: Patient will self administer medications as prescribed Patient will attend all scheduled provider appointments Patient will call pharmacy for medication refills Patient will attend church or other social activities Patient will continue to perform ADL's independently Patient will continue to perform IADL's independently Patient will call provider office for new concerns or questions            Our next appointment is by telephone on 08-25-2021 at  230 pm  Please call the care guide team at [phone] if you need to cancel or reschedule your appointment.   If you are experiencing a Mental Health or [place] or need someone to talk to, please call the Suicide and Crisis Lifeline: 988 call the [place] National Suicide Prevention Lifeline: 605-429-4675 or TTY: 848-138-7618 TTY 802-096-5191) to talk to a trained  counselor call 1-800-273-TALK (toll free, 24 hour hotline)   The patient verbalized understanding of instructions, educational materials, and care plan provided today and declined offer to receive copy of patient instructions, educational materials, and care plan.   Noreene Larsson RN, MSN, [facility] Mobile: [phone]

## 2021-07-08 ENCOUNTER — Telehealth: Payer: Self-pay

## 2021-07-08 ENCOUNTER — Telehealth: Payer: Self-pay | Admitting: Nurse Practitioner

## 2021-07-08 NOTE — Telephone Encounter (Signed)
-----   Message from [place], [place] sent at 07/07/2021  3:49 PM EST ----- Regarding: FW: More Ozempic Samples needed Any updates on the patient assistance program for the patient. Please advise? ----- Message ----- From: Venita Lick, NP Sent: 07/01/2021  12:27 PM EST To: [facility] Clinical Subject: FW: More Ozempic Samples needed                Please reach out to patient and see if she is in need of more Ozempic samples -- we are working on assistance at this time and I set reminder as she is about to run out.  Please save a 0.25 MG pen if we have some and she is running low. ----- Message ----- From: Venita Lick, NP Sent: 07/01/2021  12:00 AM EST To: Background Patient List Reminders Subject: More Ozempic Samples needed

## 2021-07-08 NOTE — Telephone Encounter (Signed)
Rayea calling from [employer] regarding cpap supplies for patient. They are needing RX  to be faxed to [phone].

## 2021-07-10 NOTE — Progress Notes (Signed)
I have contacted [employer], I spoke with a representative they have not received the application for the patient. The last application for Ozempic was back in October, it will need to be faxed at [phone].  Corrie Mckusick, [place]

## 2021-07-14 NOTE — Telephone Encounter (Signed)
Spoke with Shamia from [facility] and she reviewed patient's chart and states patient does not need an updated sleep study as she just had one in June of 2022. Arita Miss says she does not see any notes in the patient's chart regarding any orders for CPAP supplies. As the last note she saw was back on 06/29/2021. Arita Miss says if they need any addition information, they [facility]) will reach out to our office again.

## 2021-07-15 DIAGNOSIS — I340 Nonrheumatic mitral (valve) insufficiency: Secondary | ICD-10-CM | POA: Diagnosis not present

## 2021-07-15 DIAGNOSIS — R0609 Other forms of dyspnea: Secondary | ICD-10-CM | POA: Diagnosis not present

## 2021-07-15 DIAGNOSIS — R079 Chest pain, unspecified: Secondary | ICD-10-CM | POA: Diagnosis not present

## 2021-07-15 DIAGNOSIS — Z23 Encounter for immunization: Secondary | ICD-10-CM | POA: Diagnosis not present

## 2021-07-15 DIAGNOSIS — E7800 Pure hypercholesterolemia, unspecified: Secondary | ICD-10-CM | POA: Diagnosis not present

## 2021-07-15 DIAGNOSIS — N183 Chronic kidney disease, stage 3 unspecified: Secondary | ICD-10-CM | POA: Diagnosis not present

## 2021-07-15 DIAGNOSIS — I480 Paroxysmal atrial fibrillation: Secondary | ICD-10-CM | POA: Diagnosis not present

## 2021-07-16 ENCOUNTER — Telehealth: Payer: Self-pay | Admitting: Nurse Practitioner

## 2021-07-16 NOTE — Telephone Encounter (Signed)
Patient's daughter dropped off patient assistance forms for medications to be completed by provider.  Placed in provider's folder.

## 2021-07-17 NOTE — Telephone Encounter (Signed)
Called pt to let her know we will be faxing the completed paperwork

## 2021-07-19 NOTE — Patient Instructions (Signed)

## 2021-07-23 ENCOUNTER — Ambulatory Visit (INDEPENDENT_AMBULATORY_CARE_PROVIDER_SITE_OTHER): Payer: Medicare HMO | Admitting: Nurse Practitioner

## 2021-07-23 ENCOUNTER — Other Ambulatory Visit: Payer: Self-pay

## 2021-07-23 ENCOUNTER — Encounter: Payer: Self-pay | Admitting: Nurse Practitioner

## 2021-07-23 DIAGNOSIS — E1169 Type 2 diabetes mellitus with other specified complication: Secondary | ICD-10-CM

## 2021-07-23 NOTE — Progress Notes (Signed)
BP 112/66    Pulse 61    Temp 98.3 F (36.8 C) (Oral)    Wt 225 lb (102.1 kg)    SpO2 96%    BMI 42.51 kg/m    Subjective:    Patient ID: Cindy [name], female    DOB: [birth year], 71 y.o.   MRN: [id number]  HPI: Cindy [name] is a 71 y.o. female  Chief Complaint  Patient presents with   Diabetes    Patient is here for follow up on Diabetes. Patient medication was increased at her last in office visit with provider. Patient states she is doing OK. Patient denies having any concerns at today's visit.    DIABETES At visit in January A1c 8.6%, Jardiance increased to 25 MG daily -- Metformin stopped due to eGFR <30 and Ozempic started -- working on assistance, forms were faxed.  Saw cardiology last on 07/15/21 -- they started her on Prilosec to see if it helps her occasional chest burning, this is helping. Hypoglycemic episodes:no Polydipsia/polyuria: no Visual disturbance: no Chest pain: no Paresthesias: no Glucose Monitoring: yes  Accucheck frequency: Daily  Fasting glucose: 140-150 recently, improved from previous visit  Post prandial:  Evening:  Before meals: Taking Insulin?: no  Long acting insulin:  Short acting insulin: Blood Pressure Monitoring: rarely Retinal Examination:  going in March [facility] Foot Exam: Up to Date Diabetic Education: Not Completed Pneumovax: Up to Date Influenza: Up to Date Aspirin: no   Relevant past medical, surgical, family and social history reviewed and updated as indicated. Interim medical history since our last visit reviewed. Allergies and medications reviewed and updated.  Review of Systems  Constitutional:  Negative for activity change, appetite change, diaphoresis, fatigue and fever.  Respiratory:  Negative for cough, chest tightness, shortness of breath and wheezing.   Cardiovascular:  Negative for chest pain, palpitations and leg swelling.  Gastrointestinal: Negative.   Endocrine: Negative for cold intolerance, heat  intolerance, polydipsia, polyphagia and polyuria.  Neurological: Negative.   Psychiatric/Behavioral: Negative.     Per HPI unless specifically indicated above     Objective:    BP 112/66    Pulse 61    Temp 98.3 F (36.8 C) (Oral)    Wt 225 lb (102.1 kg)    SpO2 96%    BMI 42.51 kg/m   Wt Readings from Last 3 Encounters:  07/23/21 225 lb (102.1 kg)  06/23/21 225 lb 3.2 oz (102.2 kg)  03/23/21 232 lb 9.6 oz (105.5 kg)    Physical Exam Vitals and nursing note reviewed.  Constitutional:      General: She is awake. She is not in acute distress.    Appearance: She is well-developed and well-groomed. She is morbidly obese. She is not ill-appearing or toxic-appearing.  HENT:     Head: Normocephalic.     Right Ear: Hearing normal.     Left Ear: Hearing normal.  Eyes:     General: Lids are normal.        Right eye: No discharge.        Left eye: No discharge.     Conjunctiva/sclera: Conjunctivae normal.     Pupils: Pupils are equal, round, and reactive to light.  Neck:     Thyroid: No thyromegaly.     Vascular: No carotid bruit.  Cardiovascular:     Rate and Rhythm: Normal rate and regular rhythm.     Heart sounds: Murmur heard.  Systolic murmur is present with a grade  of 2/6.    No gallop.  Pulmonary:     Effort: Pulmonary effort is normal. No accessory muscle usage or respiratory distress.     Breath sounds: No decreased breath sounds or wheezing.  Abdominal:     General: Bowel sounds are normal. There is no distension.     Palpations: Abdomen is soft.  Musculoskeletal:     Cervical back: Normal range of motion and neck supple.     Right lower leg: No edema.     Left lower leg: No edema.  Lymphadenopathy:     Cervical: No cervical adenopathy.  Skin:    General: Skin is warm and dry.  Neurological:     Mental Status: She is alert and oriented to person, place, and time.     Deep Tendon Reflexes: Reflexes are normal and symmetric.     Reflex Scores:       Brachioradialis reflexes are 2+ on the right side and 2+ on the left side.      Patellar reflexes are 2+ on the right side and 2+ on the left side. Psychiatric:        Attention and Perception: Attention normal.        Mood and Affect: Mood normal.        Speech: Speech normal.        Behavior: Behavior normal. Behavior is cooperative.        Thought Content: Thought content normal.    Results for orders placed or performed in visit on 06/23/21  Bayer DCA Hb A1c Waived  Result Value Ref Range   HB A1C (BAYER DCA - WAIVED) 8.6 (H) 4.8 - 5.6 %  Microalbumin, Urine Waived  Result Value Ref Range   Microalb, Ur Waived 30 (H) 0 - 19 mg/L   Creatinine, Urine Waived 50 10 - 300 mg/dL   Microalb/Creat Ratio 30-300 (H) <30 mg/g  Lipid Panel w/o Chol/HDL Ratio  Result Value Ref Range   Cholesterol, Total 157 100 - 199 mg/dL   Triglycerides 126 0 - 149 mg/dL   HDL 45 >39 mg/dL   VLDL Cholesterol Cal 23 5 - 40 mg/dL   LDL Chol Calc (NIH) 89 0 - 99 mg/dL  VITAMIN D 25 Hydroxy (Vit-D Deficiency, Fractures)  Result Value Ref Range   Vit D, 25-Hydroxy 39.8 30.0 - 100.0 ng/mL      Assessment & Plan:   Problem List Items Addressed This Visit       Endocrine   Type 2 diabetes mellitus with morbid obesity (HCC)    Chronic, ongoing with recent A1c 8.6% today, upward trend and urine ALB 30 recent visit.   - Continue Jardiance at 25 MG -- Jardiance obtained via assistance with CCM - reach out to [place] on this assistance. - Continue Ozempic with increase to 0.5 MG weekly, sample provided today, is working on assistance for this.  Sugars are trending down with this.  No family or personal h/o thyroid cancer. - Maintain off Glimepiride due to her age and risk for hypoglycemia. - If ongoing poor control once at MAX doses of GLP1 and SGLT2, then will consider referral to endo, but at this time will continue to work on this in office which patient agrees with. - Check BS TID and focus on diabetic diet.     - Return in April for A1c check -- but alert provider prior to if elevations in BS >300 or <70.          Follow  up plan: Return in about 2 months (around 09/22/2021) for T2DM, HTN/HLD, A-FIB, CKD.

## 2021-07-23 NOTE — Assessment & Plan Note (Signed)
Chronic, ongoing with recent A1c 8.6% today, upward trend and urine ALB 30 recent visit.   - Continue Jardiance at 25 MG -- Jardiance obtained via assistance with CCM - reach out to [place] on this assistance. - Continue Ozempic with increase to 0.5 MG weekly, sample provided today, is working on assistance for this.  Sugars are trending down with this.  No family or personal h/o thyroid cancer. - Maintain off Glimepiride due to her age and risk for hypoglycemia. - If ongoing poor control once at MAX doses of GLP1 and SGLT2, then will consider referral to endo, but at this time will continue to work on this in office which patient agrees with. - Check BS TID and focus on diabetic diet.    - Return in April for A1c check -- but alert provider prior to if elevations in BS >300 or <70.

## 2021-07-24 ENCOUNTER — Telehealth: Payer: Self-pay

## 2021-07-24 NOTE — Progress Notes (Signed)
° ° °  Chronic Care Management Pharmacy Assistant   Name: SOLANA COGGIN  MRN: [id number] DOB: February 27, 1951   Reason for Encounter: Patient assistance update on Ozempic and Jardiance     Medications: Outpatient Encounter Medications as of 07/24/2021  Medication Sig   acetaminophen (TYLENOL) 650 MG CR tablet Take 650 mg by mouth every 8 (eight) hours as needed for pain.   albuterol (VENTOLIN HFA) 108 (90 Base) MCG/ACT inhaler Inhale 1-2 puffs into the lungs every 6 (six) hours as needed for wheezing or shortness of breath.   apixaban (ELIQUIS) 5 MG TABS tablet Take 1 tablet (5 mg total) by mouth 2 (two) times daily.   atorvastatin (LIPITOR) 40 MG tablet Take 40 mg by mouth daily.   bisoprolol (ZEBETA) 10 MG tablet Take 1 tablet (10 mg total) by mouth 2 (two) times daily.   busPIRone (BUSPAR) 5 MG tablet Take 1 tablet (5 mg total) by mouth 2 (two) times daily.   cetirizine (ZYRTEC) 10 MG tablet Take 10 mg by mouth daily.   diltiazem (CARDIZEM CD) 360 MG 24 hr capsule Take 360 mg by mouth daily.   DULoxetine (CYMBALTA) 60 MG capsule Take 60 mg by mouth daily.   empagliflozin (JARDIANCE) 25 MG TABS tablet Take 1 tablet (25 mg total) by mouth daily before breakfast.   ferrous sulfate 325 (65 FE) MG tablet Take 1 tablet (325 mg total) by mouth 2 (two) times daily with a meal.   fluticasone (FLONASE) 50 MCG/ACT nasal spray Place 2 sprays into both nostrils daily.   furosemide (LASIX) 80 MG tablet TAKE ONE TABLET BY MOUTH TWICE DAILY FOR FLUID   minoxidil (LONITEN) 10 MG tablet TAKE ONE TABLET BY MOUTH ONCE DAILY FOR BLOOD PRESSURE   montelukast (SINGULAIR) 10 MG tablet Take 1 tablet (10 mg total) by mouth at bedtime.   Multiple Vitamin (MULTIVITAMIN WITH MINERALS) TABS tablet Take 1 tablet by mouth daily.   olmesartan (BENICAR) 20 MG tablet Take 1 tablet (20 mg total) by mouth daily.   omeprazole (PRILOSEC) 20 MG capsule Take 20 mg by mouth daily as needed (heartburn).    polyethylene glycol  powder (GLYCOLAX/MIRALAX) powder Take 17 g by mouth daily.    Semaglutide,0.25 or 0.5MG /DOS, (OZEMPIC, 0.25 OR 0.5 MG/DOSE,) 2 MG/1.5ML SOPN Inject 0.25 mg into the skin once a week. Start with 0.25MG  once a week x 4 weeks, then increase to 0.5MG  weekly.   spironolactone (ALDACTONE) 25 MG tablet Take 1 tablet (25 mg total) by mouth daily.   No facility-administered encounter medications on file as of 07/24/2021.   I have called [facility] and spoke with a representative and they stated they have not received a patient assistance application for this patient. I will need to be re-faxed to [phone]. I also contacted [employer] and do not have any information for this patient and have not received an application as well. It will need to be re-faxed at  [phone].  Corrie Mckusick, [place]

## 2021-07-27 NOTE — Telephone Encounter (Signed)
°  Chronic Care Management   Outreach Note  07/27/2021 Name: Cindy [name] MRN: [id number] DOB: 05-Sep-1950  Spoke w/ pt - obtained social security number for PAP processing.   Needing out of pocket spending report for 2023 from pharmacy - [facility] to request and have pharmacy send to [facility] or [facility] for eliquis PAP.  Eliquis, jardiance, ozempic applications reviewed and placed in bin to be faxed to PAP and then uploaded to EMR.  Madelin Rear, Pharm.D., BCGP Clinical Pharmacist [place] [phone]

## 2021-07-28 DIAGNOSIS — N184 Chronic kidney disease, stage 4 (severe): Secondary | ICD-10-CM

## 2021-07-28 DIAGNOSIS — E1122 Type 2 diabetes mellitus with diabetic chronic kidney disease: Secondary | ICD-10-CM

## 2021-07-28 DIAGNOSIS — E1169 Type 2 diabetes mellitus with other specified complication: Secondary | ICD-10-CM

## 2021-07-28 DIAGNOSIS — E1159 Type 2 diabetes mellitus with other circulatory complications: Secondary | ICD-10-CM

## 2021-07-28 DIAGNOSIS — I152 Hypertension secondary to endocrine disorders: Secondary | ICD-10-CM

## 2021-07-28 DIAGNOSIS — E785 Hyperlipidemia, unspecified: Secondary | ICD-10-CM

## 2021-07-28 DIAGNOSIS — I480 Paroxysmal atrial fibrillation: Secondary | ICD-10-CM

## 2021-07-28 DIAGNOSIS — F321 Major depressive disorder, single episode, moderate: Secondary | ICD-10-CM

## 2021-08-05 NOTE — Progress Notes (Signed)
I have reached out to the pharmacy [facility] to request out of pocket expense report for 2023. They stated they were not able to fax over the report due to patient confidentiality. I called patient to request the expense report at her pharmacy and she can bring it into the office. Patient understood and will take it to the office as soon as she can.  ? ?Corrie Mckusick, RMA ?Health Concierge ? ?

## 2021-08-14 DIAGNOSIS — E7800 Pure hypercholesterolemia, unspecified: Secondary | ICD-10-CM | POA: Diagnosis not present

## 2021-08-14 DIAGNOSIS — E109 Type 1 diabetes mellitus without complications: Secondary | ICD-10-CM | POA: Diagnosis not present

## 2021-08-14 DIAGNOSIS — I10 Essential (primary) hypertension: Secondary | ICD-10-CM | POA: Diagnosis not present

## 2021-08-14 DIAGNOSIS — Z0100 Encounter for examination of eyes and vision without abnormal findings: Secondary | ICD-10-CM | POA: Diagnosis not present

## 2021-08-17 ENCOUNTER — Ambulatory Visit (INDEPENDENT_AMBULATORY_CARE_PROVIDER_SITE_OTHER): Payer: Medicare HMO | Admitting: Licensed Clinical Social Worker

## 2021-08-17 DIAGNOSIS — G894 Chronic pain syndrome: Secondary | ICD-10-CM

## 2021-08-17 DIAGNOSIS — E1122 Type 2 diabetes mellitus with diabetic chronic kidney disease: Secondary | ICD-10-CM

## 2021-08-17 DIAGNOSIS — I152 Hypertension secondary to endocrine disorders: Secondary | ICD-10-CM

## 2021-08-17 DIAGNOSIS — F321 Major depressive disorder, single episode, moderate: Secondary | ICD-10-CM

## 2021-08-17 NOTE — Patient Instructions (Signed)
Visit Information ? ?Thank you for taking time to visit with me today. Please don't hesitate to contact me if I can be of assistance to you before our next scheduled telephone appointment. ? ?Following are the goals we discussed today:  ?Patient Goals/Self-Care Activities: Over the next 120 days ?Contact clinic with any questions or concerns ?Utilize healthy coping skills discussed and continue compliance with meds ?Attend scheduled appointments with providers ? ?Our next appointment is by telephone on 10/19/21 at 10:00 AM ? ?Please call the care guide team at [phone] if you need to cancel or reschedule your appointment.  ? ?If you are experiencing a Mental Health or [place] or need someone to talk to, please call the Suicide and Crisis Lifeline: 988 ?call 911  ? ?The patient verbalized understanding of instructions, educational materials, and care plan provided today and declined offer to receive copy of patient instructions, educational materials, and care plan.  ? ?Christa See, MSW, LCSW ?[place] [facility] ?[place] [facility] ?[email or web address] ?Phone [phone] ?2:09 PM ?  ?

## 2021-08-17 NOTE — Chronic Care Management (AMB) (Signed)
?Chronic Care Management  ? ? Clinical Social Work Note ? ?08/17/2021 ?Name: Cindy [name] MRN: [id number] DOB: [birth year] ? ?Cindy [name] is a 71 y.o. year old female who is a primary care patient of Cannady, Barbaraann Faster, NP. The CCM team was consulted to assist the patient with chronic disease management and/or care coordination needs related to: Mental Health Counseling and Resources.  ? ?Engaged with patient by telephone for follow up visit in response to provider referral for social work chronic care management and care coordination services.  ? ?Consent to Services:  ?The patient was given information about Chronic Care Management services, agreed to services, and gave verbal consent prior to initiation of services.  Please see initial visit note for detailed documentation.  ? ?Patient agreed to services and consent obtained.  ? ?Assessment: Review of patient past medical history, allergies, medications, and health status, including review of relevant consultants reports was performed today as part of a comprehensive evaluation and provision of chronic care management and care coordination services.    ? ?SDOH (Social Determinants of Health) assessments and interventions performed:   ? ?Advanced Directives Status: Not addressed in this encounter. ? ?CCM Care Plan ? ?Allergies  ?Allergen Reactions  ? Bupropion Other (See Comments)  ? Gabapentin Palpitations  ? Pioglitazone Palpitations  ? Vicodin [Hydrocodone-Acetaminophen] Other (See Comments)  ?  Reaction: racing heart  ? Vicoprofen [Hydrocodone-Ibuprofen] Other (See Comments)  ?  Reaction: racing heart  ? ? ?Outpatient Encounter Medications as of 08/17/2021  ?Medication Sig  ? acetaminophen (TYLENOL) 650 MG CR tablet Take 650 mg by mouth every 8 (eight) hours as needed for pain.  ? albuterol (VENTOLIN HFA) 108 (90 Base) MCG/ACT inhaler Inhale 1-2 puffs into the lungs every 6 (six) hours as needed for wheezing or shortness of breath.  ? apixaban (ELIQUIS) 5 MG  TABS tablet Take 1 tablet (5 mg total) by mouth 2 (two) times daily.  ? atorvastatin (LIPITOR) 40 MG tablet Take 40 mg by mouth daily.  ? bisoprolol (ZEBETA) 10 MG tablet Take 1 tablet (10 mg total) by mouth 2 (two) times daily.  ? busPIRone (BUSPAR) 5 MG tablet Take 1 tablet (5 mg total) by mouth 2 (two) times daily.  ? cetirizine (ZYRTEC) 10 MG tablet Take 10 mg by mouth daily.  ? diltiazem (CARDIZEM CD) 360 MG 24 hr capsule Take 360 mg by mouth daily.  ? DULoxetine (CYMBALTA) 60 MG capsule Take 60 mg by mouth daily.  ? empagliflozin (JARDIANCE) 25 MG TABS tablet Take 1 tablet (25 mg total) by mouth daily before breakfast.  ? ferrous sulfate 325 (65 FE) MG tablet Take 1 tablet (325 mg total) by mouth 2 (two) times daily with a meal.  ? fluticasone (FLONASE) 50 MCG/ACT nasal spray Place 2 sprays into both nostrils daily.  ? furosemide (LASIX) 80 MG tablet TAKE ONE TABLET BY MOUTH TWICE DAILY FOR FLUID  ? minoxidil (LONITEN) 10 MG tablet TAKE ONE TABLET BY MOUTH ONCE DAILY FOR BLOOD PRESSURE  ? montelukast (SINGULAIR) 10 MG tablet Take 1 tablet (10 mg total) by mouth at bedtime.  ? Multiple Vitamin (MULTIVITAMIN WITH MINERALS) TABS tablet Take 1 tablet by mouth daily.  ? olmesartan (BENICAR) 20 MG tablet Take 1 tablet (20 mg total) by mouth daily.  ? omeprazole (PRILOSEC) 20 MG capsule Take 20 mg by mouth daily as needed (heartburn).   ? polyethylene glycol powder (GLYCOLAX/MIRALAX) powder Take 17 g by mouth daily.   ? Semaglutide,0.25 or 0.'5MG'$ /DOS, (  OZEMPIC, 0.25 OR 0.5 MG/DOSE,) 2 MG/1.5ML SOPN Inject 0.25 mg into the skin once a week. Start with 0.'25MG'$  once a week x 4 weeks, then increase to 0.'5MG'$  weekly.  ? spironolactone (ALDACTONE) 25 MG tablet Take 1 tablet (25 mg total) by mouth daily.  ? ?No facility-administered encounter medications on file as of 08/17/2021.  ? ? ?Patient Active Problem List  ? Diagnosis Date Noted  ? Left ventricular hypertrophy 06/21/2021  ? Primary hyperparathyroidism ([place]) 03/14/2021   ? DOE (dyspnea on exertion) 01/13/2021  ? Post-COVID syndrome 12/03/2020  ? Recurrent boils 12/03/2020  ? Morbid obesity ([place]) 12/03/2020  ? CKD stage 4 due to type 2 diabetes mellitus ([place]) 12/01/2020  ? Aortic atherosclerosis ([place]) 12/01/2020  ? Hypertension associated with diabetes ([place]) 07/15/2020  ? Paroxysmal atrial fibrillation ([place]) 07/15/2020  ? Chronic pain syndrome 07/15/2020  ? Depression, major, single episode, moderate ([place]) 07/15/2020  ? Type 2 diabetes mellitus with morbid obesity ([place]) 07/15/2020  ? GERD (gastroesophageal reflux disease) 07/15/2020  ? Hyperlipidemia associated with type 2 diabetes mellitus ([place]) 07/15/2020  ? OSA (obstructive sleep apnea) 07/15/2020  ? Other thrombophilia ([place]) 03/12/2020  ? History of lumbar spinal fusion 04/26/2018  ? Diuretic-induced hypokalemia 03/24/2018  ? Hypertensive retinopathy of both eyes 06/08/2017  ? DDD (degenerative disc disease), lumbar 10/31/2014  ? Cervical spondylosis with myelopathy 02/22/2014  ? Anemia in chronic kidney disease (CKD) 02/22/2014  ? Mitral regurgitation 09/08/2013  ? ? ?Conditions to be addressed/monitored: HTN, DMII, CKD Stage 4, and Depression ? ?Care Plan : General Social Work (Adult)  ?Updates made by Rebekah Chesterfield, LCSW since 08/17/2021 12:00 AM  ?  ? ?Problem: Coping Skills (General Plan of Care)   ?  ? ?Goal: Coping Skills Enhanced   ?Start Date: 01/16/2021  ?This Visit's Progress: On track  ?Recent Progress: On track  ?Priority: High  ?Note:   ?Current barriers:   ?Acute Mental Health needs related to depression ?Mental Health Concerns  ?Needs Support, Education, and Care Coordination in order to meet unmet mental health needs. ?Clinical Goal(s): Over the next 120 days, patient will work with SW, counselor and therapist to reduce or manage symptoms of agitation, mood instability, stress, and bipolar until connected for ongoing counseling. ?Clinical Interventions:  ?1:1 collaboration with primary care provider regarding  development and update of comprehensive plan of care as evidenced by provider attestation and co-signature ?Assessed patient's previous and current treatment, coping skills, support system and barriers to care  ?Patient reports difficulty managing symptoms of depression triggered by chronic pain, chronic health conditions, and limited mobility She endorses recent decrease in symptoms (low energy, feeling sad, and anxiety about pain) since "feeling better and becoming stronger" after multiple hospital visits the beginning of 2022 10/13: Patient reports depression symptoms are managed. "I still have my good and bad days but things are going pretty good" 12/19: Patient reports compliance with medications and eating healthy to assist with management of BS readings. Patient shared the her readings have escalated due to "personal" stressors, she did not want to elaborate. CCM LCSW assisted patient in identifying strategies to assist with stress management ie) positive thinking, praying, and reading scriptures 3/20: Patient reports she has improved the management of diabetes, with good results from ozempic. Patient is currently having difficulty managing ongoing pain in both legs that radiates from back to the bottom of her feet. Patient is requesting a referral to further address. CCM LCSW will collaborate with PCP ?Patient endorses dizzy spells occasionally, believing  it is a side effect of her medications. Denies recent falls 3/20: Patient will utilize cane and walker to assist with decreasing fall risk ?Patient endorses instances of low blood sugar. When this occurs, patient will drink orange juice or eat a peppermint candy. Patient is unsure as to what is an appropriate blood sugar/blood pressure reading. CCM LCSW will collaborate with CCM RNCM to provide educational information to promote health and well-being ?CCM LCSW provided validation and encouragement. Healthy coping skills were discussed to assist with  management of symptoms Patient was successful in identifying healthy coping skills that she can utilize daily. Enjoys driving to maintain independence 10/13: Patient continues to utilize healthy coping skills

## 2021-08-25 ENCOUNTER — Telehealth: Payer: Medicare HMO

## 2021-08-25 ENCOUNTER — Telehealth: Payer: Self-pay

## 2021-08-25 DIAGNOSIS — N1832 Chronic kidney disease, stage 3b: Secondary | ICD-10-CM | POA: Diagnosis not present

## 2021-08-25 DIAGNOSIS — E1122 Type 2 diabetes mellitus with diabetic chronic kidney disease: Secondary | ICD-10-CM | POA: Diagnosis not present

## 2021-08-25 DIAGNOSIS — N184 Chronic kidney disease, stage 4 (severe): Secondary | ICD-10-CM | POA: Diagnosis not present

## 2021-08-25 DIAGNOSIS — N2581 Secondary hyperparathyroidism of renal origin: Secondary | ICD-10-CM | POA: Diagnosis not present

## 2021-08-25 DIAGNOSIS — I10 Essential (primary) hypertension: Secondary | ICD-10-CM | POA: Diagnosis not present

## 2021-08-25 DIAGNOSIS — D631 Anemia in chronic kidney disease: Secondary | ICD-10-CM | POA: Diagnosis not present

## 2021-08-25 NOTE — Telephone Encounter (Signed)
?  Care Management  ? ?Follow Up Note ? ? ?08/25/2021 ?Name: Cindy [name] MRN: [id number] DOB: 1950-07-13 ? ? ?Referred by: Venita Lick, NP ?Reason for referral : Chronic Care Management (RNCM: Follow up for Chronic Disease Management and Care Coordination Needs) ? ? ?An unsuccessful telephone outreach was attempted today. The patient was referred to the case management team for assistance with care management and care coordination.  ? ?Follow Up Plan: A HIPPA compliant phone message was left for the patient providing contact information and requesting a return call.  ? ?Noreene Larsson RN, MSN, CCM ?Community Care Coordinator ?[place] [facility] ?[place] ?Mobile: [phone]  ?

## 2021-08-27 ENCOUNTER — Ambulatory Visit: Payer: Self-pay

## 2021-08-27 DIAGNOSIS — G894 Chronic pain syndrome: Secondary | ICD-10-CM

## 2021-08-27 DIAGNOSIS — F321 Major depressive disorder, single episode, moderate: Secondary | ICD-10-CM

## 2021-08-27 DIAGNOSIS — I480 Paroxysmal atrial fibrillation: Secondary | ICD-10-CM

## 2021-08-27 DIAGNOSIS — E1169 Type 2 diabetes mellitus with other specified complication: Secondary | ICD-10-CM

## 2021-08-27 DIAGNOSIS — E1122 Type 2 diabetes mellitus with diabetic chronic kidney disease: Secondary | ICD-10-CM

## 2021-08-27 DIAGNOSIS — I152 Hypertension secondary to endocrine disorders: Secondary | ICD-10-CM

## 2021-08-27 NOTE — Chronic Care Management (AMB) (Signed)
?Chronic Care Management  ? ?CCM RN Visit Note ? ?08/27/2021 ?Name: Cindy [name] MRN: [id number] DOB: [birth year] ? ?Subjective: ?Cindy [name] is a 71 y.o. year old female who is a primary care patient of Cannady, Barbaraann Faster, NP. The care management team was consulted for assistance with disease management and care coordination needs.   ? ?Engaged with patient by telephone for follow up visit in response to provider referral for case management and/or care coordination services.  ? ?Consent to Services:  ?The patient was given information about Chronic Care Management services, agreed to services, and gave verbal consent prior to initiation of services.  Please see initial visit note for detailed documentation.  ? ?Patient agreed to services and verbal consent obtained.  ? ?Assessment: Review of patient past medical history, allergies, medications, health status, including review of consultants reports, laboratory and other test data, was performed as part of comprehensive evaluation and provision of chronic care management services.  ? ?SDOH (Social Determinants of Health) assessments and interventions performed:   ? ?CCM Care Plan ? ?Allergies  ?Allergen Reactions  ? Bupropion Other (See Comments)  ? Gabapentin Palpitations  ? Pioglitazone Palpitations  ? Vicodin [Hydrocodone-Acetaminophen] Other (See Comments)  ?  Reaction: racing heart  ? Vicoprofen [Hydrocodone-Ibuprofen] Other (See Comments)  ?  Reaction: racing heart  ? ? ?Outpatient Encounter Medications as of 08/27/2021  ?Medication Sig  ? acetaminophen (TYLENOL) 650 MG CR tablet Take 650 mg by mouth every 8 (eight) hours as needed for pain.  ? albuterol (VENTOLIN HFA) 108 (90 Base) MCG/ACT inhaler Inhale 1-2 puffs into the lungs every 6 (six) hours as needed for wheezing or shortness of breath.  ? apixaban (ELIQUIS) 5 MG TABS tablet Take 1 tablet (5 mg total) by mouth 2 (two) times daily.  ? atorvastatin (LIPITOR) 40 MG tablet Take 40 mg by mouth daily.  ?  bisoprolol (ZEBETA) 10 MG tablet Take 1 tablet (10 mg total) by mouth 2 (two) times daily.  ? busPIRone (BUSPAR) 5 MG tablet Take 1 tablet (5 mg total) by mouth 2 (two) times daily.  ? cetirizine (ZYRTEC) 10 MG tablet Take 10 mg by mouth daily.  ? diltiazem (CARDIZEM CD) 360 MG 24 hr capsule Take 360 mg by mouth daily.  ? DULoxetine (CYMBALTA) 60 MG capsule Take 60 mg by mouth daily.  ? empagliflozin (JARDIANCE) 25 MG TABS tablet Take 1 tablet (25 mg total) by mouth daily before breakfast.  ? ferrous sulfate 325 (65 FE) MG tablet Take 1 tablet (325 mg total) by mouth 2 (two) times daily with a meal.  ? fluticasone (FLONASE) 50 MCG/ACT nasal spray Place 2 sprays into both nostrils daily.  ? furosemide (LASIX) 80 MG tablet TAKE ONE TABLET BY MOUTH TWICE DAILY FOR FLUID  ? minoxidil (LONITEN) 10 MG tablet TAKE ONE TABLET BY MOUTH ONCE DAILY FOR BLOOD PRESSURE  ? montelukast (SINGULAIR) 10 MG tablet Take 1 tablet (10 mg total) by mouth at bedtime.  ? Multiple Vitamin (MULTIVITAMIN WITH MINERALS) TABS tablet Take 1 tablet by mouth daily.  ? olmesartan (BENICAR) 20 MG tablet Take 1 tablet (20 mg total) by mouth daily.  ? omeprazole (PRILOSEC) 20 MG capsule Take 20 mg by mouth daily as needed (heartburn).   ? polyethylene glycol powder (GLYCOLAX/MIRALAX) powder Take 17 g by mouth daily.   ? Semaglutide,0.25 or 0.'5MG'$ /DOS, (OZEMPIC, 0.25 OR 0.5 MG/DOSE,) 2 MG/1.5ML SOPN Inject 0.25 mg into the skin once a week. Start with 0.'25MG'$  once a week  x 4 weeks, then increase to 0.'5MG'$  weekly.  ? spironolactone (ALDACTONE) 25 MG tablet Take 1 tablet (25 mg total) by mouth daily.  ? ?No facility-administered encounter medications on file as of 08/27/2021.  ? ? ?Patient Active Problem List  ? Diagnosis Date Noted  ? Left ventricular hypertrophy 06/21/2021  ? Primary hyperparathyroidism ([place]) 03/14/2021  ? DOE (dyspnea on exertion) 01/13/2021  ? Post-COVID syndrome 12/03/2020  ? Recurrent boils 12/03/2020  ? Morbid obesity ([place]) 12/03/2020   ? CKD stage 4 due to type 2 diabetes mellitus ([place]) 12/01/2020  ? Aortic atherosclerosis ([place]) 12/01/2020  ? Hypertension associated with diabetes ([place]) 07/15/2020  ? Paroxysmal atrial fibrillation ([place]) 07/15/2020  ? Chronic pain syndrome 07/15/2020  ? Depression, major, single episode, moderate ([place]) 07/15/2020  ? Type 2 diabetes mellitus with morbid obesity ([place]) 07/15/2020  ? GERD (gastroesophageal reflux disease) 07/15/2020  ? Hyperlipidemia associated with type 2 diabetes mellitus ([place]) 07/15/2020  ? OSA (obstructive sleep apnea) 07/15/2020  ? Other thrombophilia ([place]) 03/12/2020  ? History of lumbar spinal fusion 04/26/2018  ? Diuretic-induced hypokalemia 03/24/2018  ? Hypertensive retinopathy of both eyes 06/08/2017  ? DDD (degenerative disc disease), lumbar 10/31/2014  ? Cervical spondylosis with myelopathy 02/22/2014  ? Anemia in chronic kidney disease (CKD) 02/22/2014  ? Mitral regurgitation 09/08/2013  ? ? ?Conditions to be addressed/monitored:Atrial Fibrillation, HTN, HLD, CKD Stage 4, Depression, and Chronic pain  ? ?Care Plan : RNCM: General plan of care for AFIB, HTN, HLD, CKD4, DM, Chronic pain, and Depression  ?Updates made by Vanita Ingles, RN since 08/27/2021 12:00 AM  ?  ? ?Problem: RNCM: General plan of care for AFIB, HTN, HLD, CKD4, DM, Chronic pain, and Depression   ?Priority: High  ?  ? ?Long-Range Goal: RNCM: General plan of care for AFIB, HTN, HLD, CKD4, DM, Chronic pain, and Depression   ?Start Date: 12/24/2020  ?Expected End Date: 12/24/2021  ?Recent Progress: On track  ?Priority: High  ?Note:   ?Current Barriers:  ?Knowledge Deficits related to plan of care for management of Atrial Fibrillation, HTN, HLD, DMII, CKD Stage 4, Depression, and chronic pain   ?Care Coordination needs related to Financial constraints related to difficulty in affording medications , Level of care concerns, and Mental Health Concerns  in a patient with Atrial Fibrillation, HTN, HLD, DMII, CKD Stage 4,  Depression, and chronic pain  ?Chronic Disease Management support and education needs related to Atrial Fibrillation, HTN, HLD, DMII, CKD Stage 4, Depression, and chronic pain  ?Difficulty obtaining medications ? ?RNCM Clinical Goal(s):  ?Patient will verbalize understanding of plan for management of Atrial Fibrillation, HTN, HLD, DMII, CKD Stage 4, Depression, and chronic pain  ?work with All embedded care coordination to address needs related to Atrial Fibrillation, HTN, HLD, DMII, CKD Stage 4, Depression, and chronic pain  and Financial constraints related to medication cost  and Mental Health Concerns  ?take all medications exactly as prescribed and will call provider for medication related questions ?attend all scheduled medical appointments: 09-21-2021 ?demonstrate a decrease in Atrial Fibrillation, HTN, HLD, DMII, CKD Stage 4, Depression, and Chronic pain  exacerbations ?demonstrate improved adherence to prescribed treatment plan for Atrial Fibrillation, HTN, HLD, DMII, CKD Stage 4, Depression, and chronic pain  ?demonstrate improved health management independence ?verbalize basic understanding of Atrial Fibrillation, HTN, HLD, DMII, CKD Stage 4, Depression, and Chronic pain  disease process and self health management plan ?demonstrate understanding of rationale for each prescribed medication ?work with CM clinical social worker to  help with building coping skills and handle stress over multiple chronic conditions  ?demonstrate ongoing self health care management ability through collaboration with RN Care manager, provider, and care team.  ? ?Interventions: ?1:1 collaboration with primary care provider regarding development and update of comprehensive plan of care as evidenced by provider attestation and co-signature ?Inter-disciplinary care team collaboration (see longitudinal plan of care) ? ? ?A-fib:  (Status: Goal on track: YES.) ?Counseled on increased risk of stroke due to Afib and benefits of  anticoagulation for stroke prevention; ?Reviewed importance of adherence to anticoagulant exactly as prescribed. 01-27-2021: the patient was approved for help with Eliquis until the end of the year. The patient is compli

## 2021-08-27 NOTE — Patient Instructions (Signed)
Visit Information ? ?Thank you for taking time to visit with me today. Please don't hesitate to contact me if I can be of assistance to you before our next scheduled telephone appointment. ? ?Following are the goals we discussed today:  ?RNCM Clinical Goal(s):  ?Patient will verbalize understanding of plan for management of Atrial Fibrillation, HTN, HLD, DMII, CKD Stage 4, Depression, and chronic pain  ?work with All embedded care coordination to address needs related to Atrial Fibrillation, HTN, HLD, DMII, CKD Stage 4, Depression, and chronic pain  and Financial constraints related to medication cost  and Mental Health Concerns  ?take all medications exactly as prescribed and will call provider for medication related questions ?attend all scheduled medical appointments: 09-21-2021 ?demonstrate a decrease in Atrial Fibrillation, HTN, HLD, DMII, CKD Stage 4, Depression, and Chronic pain  exacerbations ?demonstrate improved adherence to prescribed treatment plan for Atrial Fibrillation, HTN, HLD, DMII, CKD Stage 4, Depression, and chronic pain  ?demonstrate improved health management independence ?verbalize basic understanding of Atrial Fibrillation, HTN, HLD, DMII, CKD Stage 4, Depression, and Chronic pain  disease process and self health management plan ?demonstrate understanding of rationale for each prescribed medication ?work with CM clinical social worker to help with building coping skills and handle stress over multiple chronic conditions  ?demonstrate ongoing self health care management ability through collaboration with RN Care manager, provider, and care team.  ?  ?Interventions: ?1:1 collaboration with primary care provider regarding development and update of comprehensive plan of care as evidenced by provider attestation and co-signature ?Inter-disciplinary care team collaboration (see longitudinal plan of care) ?  ?  ?A-fib:  (Status: Goal on track: YES.) ?Counseled on increased risk of stroke due to Afib  and benefits of anticoagulation for stroke prevention; ?Reviewed importance of adherence to anticoagulant exactly as prescribed. 01-27-2021: the patient was approved for help with Eliquis until the end of the year. The patient is compliant with medications and is happy that she has received help for the Eliquis as she was in the donut hole. She states getting help with the medication has helped a whole lot. 07-01-2021: The patient is compliant with medications. Has her medications. 08-27-2021: The patient states the Eliquis paperwork was denied because it did not include her out of pocket from pharmacy expense sheet with it. Will reach back out to the pharm D for assistance with additional paperwork needs for patient to receive Eliquis support.  ?Advised patient to discuss changes in AFIB, concerns or questions  with provider; ?Counseled on bleeding risk associated with Eliquis and importance of self-monitoring for signs/symptoms of bleeding. 08-27-2021: Denies any needs at this time. Will continue to monitor. ; ?Counseled on avoidance of NSAIDs due to increased bleeding risk with anticoagulants. 08-27-2021: Reviewed again for the patient to avoid NSAIDS; ?Counseled on importance of regular laboratory monitoring as prescribed; ?Counseled on seeking medical attention after a head injury or if there is blood in the urine/stool; ?Afib action plan reviewed. 08-27-2021: The patient states that her AFIB is stable. Denies any acute findings with her AFIB or heart health at this time; ?  ?Diabetes:  (Status: Goal on track: YES.) ?     ?Lab Results  ?Component Value Date  ?  HGBA1C 8.6 (H) 06/23/2021  ?03-23-2021- 7.4 ?Assessed patient's understanding of A1c goal: <7% ?Provided education to patient about basic DM disease process; ?Reviewed medications with patient and discussed importance of medication adherence. 01-27-2021: The patient states that she is taking the Jardiance and ozempic as directed. She  has been approved for help  with the Jardiance until the end of the year. 03-13-2021: The patient states that she is no longer taking the Ozempic, after finishing the samples the patient did not know that she was supposed to continue. Advised the patient that she had refills at [facility]. The patient states she is staking the Metformin and Jardiance but not the Ozempic. Advised the patient to call the pharmacy and inquire about the Ozempic. The patient also has an appointment in the office to see the pcp on 03-18-2021 and advised the patient to discuss this with the pcp. Will collaborate with the pcp and pharm D on plan of care for DM and the patient wanting to know if she is supposed to be taking all the medications for her DM. 07-01-2021: The patient is taking her Jardiance at 25 mg and has restarted the sample of Ozempic. She has taken 2 weeks worth. Working with the pharm D for assistance with Ozempic and getting Ozempic through medication financial assistance. 08-27-2021: The patient has been approved for her medications through out 2023 and is happy for this. She denies any medication needs related to her DM. ?Counseled on importance of regular laboratory monitoring as prescribed. 01-27-2021: The patient likely will have new lab work in October. 03-13-2021: Review of most recent lab work and A1C. Education on the goal of A1C being less than 7.0. 03-31-2021: Education on A1C being 7.4% with goal of 7.0. 07-01-2021: On 06-23-2021 A1C increased to 8.6%. The patient is frustrated that her A1C and blood sugars are elevated. Emotional support and education given. Encouraged the patient to talk about her frustrations with the [facility]. The patient has had medication changes and knows that she will see positive changes.  ?Discussed plans with patient for ongoing care management follow up and provided patient with direct contact information for care management team; ?Provided patient with written educational materials related to hypo and hyperglycemia and  importance of correct treatment. 08-27-2021: Review of blood sugars. Denies any lows or highs. Reminded the patient to call the office for blood sugars <70 and >300; ?Reviewed scheduled/upcoming provider appointments including: 09-21-2021 ?Advised patient, providing education and rationale, to check cbg bid and record, calling pcp for findings outside established parameters. 01-27-2021: The patient states that her blood sugar this am was 189.  The patient states it has been going up and down. She has started taking the Jardiance again. Education provided that blood sugars should stabilize with taking Jardiance and Ozempic.  Education on continuing to check blood sugars and call for extreme changes in blood sugar readings. 03-13-2021: The patient states that her blood sugars this am was 189. The patient states that she knows if it gets down to 100 it is too low for her. Education on fasting blood sugar goal of <130 and post prandial of <180.  The patient states she takes her blood sugars are various time and if she feels "funny" or different.  Education and support given. 03-31-2021: The patient states that her blood sugar this am was 189 and at lunch it was 242. Education provided on goals of blood sugar to be <130 fasting and <180 2 hours after eating. The patient verbalized understanding. 07-01-2021: The patient admits she is frustrated because her blood sugars are 197 to 260. She states she is taking her medications like she is supposed to. Reminded the patient of the time it takes for medications to get in her system and to work on taking as directed  and dietary changes. Review of fasting goal of <130 and <180 post prandial. Reflective listening and support given. 08-27-2021: The patient states that her blood sugars range is 139 to 161 and doing better. Review of goals again and the patient verbalized understanding.  ?Referral made to pharmacy team for assistance with obtaining financial assistance with Ozempic and  other medications, patient spoke to pharmacist on 12-23-2020. 01-27-2021: Working with the pharmacy team for assistance with medications. 03-13-2021: Collaboration with the pharm D concerning assistance with

## 2021-08-28 DIAGNOSIS — F321 Major depressive disorder, single episode, moderate: Secondary | ICD-10-CM

## 2021-08-28 DIAGNOSIS — E1122 Type 2 diabetes mellitus with diabetic chronic kidney disease: Secondary | ICD-10-CM | POA: Diagnosis not present

## 2021-08-28 DIAGNOSIS — E785 Hyperlipidemia, unspecified: Secondary | ICD-10-CM | POA: Diagnosis not present

## 2021-08-28 DIAGNOSIS — I480 Paroxysmal atrial fibrillation: Secondary | ICD-10-CM

## 2021-08-28 DIAGNOSIS — E1169 Type 2 diabetes mellitus with other specified complication: Secondary | ICD-10-CM | POA: Diagnosis not present

## 2021-08-28 DIAGNOSIS — E1159 Type 2 diabetes mellitus with other circulatory complications: Secondary | ICD-10-CM

## 2021-08-28 DIAGNOSIS — N184 Chronic kidney disease, stage 4 (severe): Secondary | ICD-10-CM

## 2021-08-28 DIAGNOSIS — I152 Hypertension secondary to endocrine disorders: Secondary | ICD-10-CM | POA: Diagnosis not present

## 2021-08-31 ENCOUNTER — Other Ambulatory Visit: Payer: Self-pay | Admitting: Nurse Practitioner

## 2021-08-31 NOTE — Telephone Encounter (Signed)
Pt has finished her RX for Metoprolol '100mg'$  / and was advised by Jolene that once finished she should start taking bisoprolol (ZEBETA) 10 MG tablet   /pt contacted pharmacy and they advised she needs a refill /  please advise and send to  ?[facility] [place], [place] [facility] Phone:  [phone]  ?Fax:  [phone]  ?  ? ?

## 2021-09-01 MED ORDER — BISOPROLOL FUMARATE 10 MG PO TABS: 10.0000 mg | ORAL_TABLET | Freq: Two times a day (BID) | ORAL | 1 refills | Status: DC

## 2021-09-01 NOTE — Telephone Encounter (Signed)
Original Rx was sent to [facility]- they have closed- remainder of Rx forwarded to [place] ?Requested Prescriptions  ?Pending Prescriptions Disp Refills  ?? bisoprolol (ZEBETA) 10 MG tablet 180 tablet 1  ?  Sig: Take 1 tablet (10 mg total) by mouth 2 (two) times daily.  ?  ? Cardiovascular: Beta Blockers 2 Failed - 08/31/2021  3:54 PM  ?  ?  Failed - Cr in normal range and within 360 days  ?  Creatinine  ?Date Value Ref Range Status  ?01/24/2014 0.86 0.60 - 1.30 mg/dL Final  ? ?Creatinine, Ser  ?Date Value Ref Range Status  ?03/23/2021 1.70 (H) 0.57 - 1.00 mg/dL Final  ?   ?  ?  Passed - Last BP in normal range  ?  BP Readings from Last 1 Encounters:  ?07/23/21 112/66  ?   ?  ?  Passed - Last Heart Rate in normal range  ?  Pulse Readings from Last 1 Encounters:  ?07/23/21 61  ?   ?  ?  Passed - Valid encounter within last 6 months  ?  Recent Outpatient Visits   ?      ? 1 month ago Type 2 diabetes mellitus with morbid obesity ([place])  ? [place], [place] T, NP  ? 2 months ago Type 2 diabetes mellitus with morbid obesity ([place])  ? [place], [place] T, NP  ? 5 months ago Type 2 diabetes mellitus with morbid obesity ([place])  ? [place], [place] T, NP  ? 8 months ago Type 2 diabetes mellitus with morbid obesity ([place])  ? [facility] [employer], Henrine Screws T, NP  ? 9 months ago Encounter to establish care  ? [facility] [place], Henrine Screws T, NP  ?  ?  ?Future Appointments   ?        ? In 2 weeks Cannady, Barbaraann Faster, NP [employer], [facility]  ? In 4 months  [place]  ?  ? ?  ?  ?  ? ? ?

## 2021-09-04 ENCOUNTER — Telehealth: Payer: Self-pay | Admitting: Nurse Practitioner

## 2021-09-04 MED ORDER — OZEMPIC (0.25 OR 0.5 MG/DOSE) 2 MG/1.5ML ~~LOC~~ SOPN: 0.5000 mg | PEN_INJECTOR | SUBCUTANEOUS | 4 refills | Status: DC

## 2021-09-04 NOTE — Telephone Encounter (Signed)
Pt stated she received a letter on March 13th stating that she was approved for pt assistance for the medication Semaglutide,0.25 or 0.'5MG'$ /DOS, (OZEMPIC, 0.25 OR 0.5 MG/DOSE,) 2 MG/1.5ML SOPNPt states the letter says the medication will be sent to the PCP' office. Pt is wanting to follow up.  ? ?Pt stated she has no more refills and has not taken it since Wednesday .Pt mentioned she forgot to take it on Wednesday, and when she remembered, her pen had lines on it, and she believes she is out of prescription. Pt sounded unsure. ? ?Pt is requesting a call back today if possible.  ?

## 2021-09-04 NOTE — Telephone Encounter (Signed)
Medication Refill - Medication: Semaglutide,0.25 or 0.'5MG'$ /DOS, (OZEMPIC, 0.25 OR 0.5 MG/DOSE,) 2 MG/1.5ML SOPN ? ?Has the patient contacted their pharmacy? Yes.   ?Pt states she is out of her medication.  ?Preferred Pharmacy (with phone number or street name): [facility] #[id number] [address], [place] [facility] ? ?Has the patient been seen for an appointment in the last year OR does the patient have an upcoming appointment? Yes.   ? ?She said Jolene gave her a sample at last visit.  She does not remember taking this her last dose on Wed.  Last night when she remembered, she got up to take and pen was empty.  ?Pt wants is ok not to take anymore until next Wed. ?Pt wants to know if assistance program was to send to the office? ? ?If it is not too expensive she can pick up at pharmacy ?

## 2021-09-04 NOTE — Telephone Encounter (Signed)
Call to patient- advised office is now closed for holiday. Advised patient I would send her message for review on Monday am- and office will contact her regarding samples and Rx program. Offered to have medication sent to pharmacy- but patient declines due to cost. Patient advised to keep watch on her glucose levels and call office if they become elevated. Patient states she understands. ?

## 2021-09-07 NOTE — Progress Notes (Signed)
I have contacted [employer] to check on the status of patients's Ozempic medication. The medication is currently being processed. They stated to follow up within 1-2 days to get the tracking number information and shipping status. I made patient aware of this. Patient stated she did received the letter of denial of her Eliquis PAP stating out of pocket expense report wasn't not attached with the form. That is why she was denied for the Eliquis PAP. ? ?Cindy [name], RMA ?Health Concierge ? ?

## 2021-09-07 NOTE — Telephone Encounter (Signed)
Cindy [name] and Cindy [name], can you check on the application and status for the patient? It is unclear in documentation if the patient's application was resent or not. Please follow up ASAP as patient is in need of medication.  ?

## 2021-09-08 NOTE — Progress Notes (Signed)
I have re-faxed patient's assistance application on Eliquis and out of pocket expense report as well to [place] patient assistance foundation. ? ?Corrie Mckusick, RMA ?Health Concierge ? ?

## 2021-09-10 ENCOUNTER — Telehealth: Payer: Self-pay

## 2021-09-10 NOTE — Telephone Encounter (Signed)
5 boxes of Ozempic received for the patient. Called and notified patient that medication was ready for pick up.  ?

## 2021-09-14 NOTE — Telephone Encounter (Signed)
Medication picked up.

## 2021-09-19 NOTE — Patient Instructions (Signed)

## 2021-09-21 ENCOUNTER — Telehealth: Payer: Self-pay

## 2021-09-21 ENCOUNTER — Encounter: Payer: Self-pay | Admitting: Nurse Practitioner

## 2021-09-21 ENCOUNTER — Ambulatory Visit (INDEPENDENT_AMBULATORY_CARE_PROVIDER_SITE_OTHER): Payer: Medicare HMO | Admitting: Nurse Practitioner

## 2021-09-21 DIAGNOSIS — E1169 Type 2 diabetes mellitus with other specified complication: Secondary | ICD-10-CM

## 2021-09-21 DIAGNOSIS — I152 Hypertension secondary to endocrine disorders: Secondary | ICD-10-CM | POA: Diagnosis not present

## 2021-09-21 DIAGNOSIS — E785 Hyperlipidemia, unspecified: Secondary | ICD-10-CM

## 2021-09-21 DIAGNOSIS — E1159 Type 2 diabetes mellitus with other circulatory complications: Secondary | ICD-10-CM

## 2021-09-21 DIAGNOSIS — F321 Major depressive disorder, single episode, moderate: Secondary | ICD-10-CM | POA: Diagnosis not present

## 2021-09-21 DIAGNOSIS — D631 Anemia in chronic kidney disease: Secondary | ICD-10-CM

## 2021-09-21 DIAGNOSIS — G4733 Obstructive sleep apnea (adult) (pediatric): Secondary | ICD-10-CM

## 2021-09-21 DIAGNOSIS — D6869 Other thrombophilia: Secondary | ICD-10-CM | POA: Diagnosis not present

## 2021-09-21 DIAGNOSIS — I340 Nonrheumatic mitral (valve) insufficiency: Secondary | ICD-10-CM

## 2021-09-21 DIAGNOSIS — E210 Primary hyperparathyroidism: Secondary | ICD-10-CM

## 2021-09-21 DIAGNOSIS — E1122 Type 2 diabetes mellitus with diabetic chronic kidney disease: Secondary | ICD-10-CM

## 2021-09-21 DIAGNOSIS — I700 Atherosclerosis of aorta: Secondary | ICD-10-CM | POA: Diagnosis not present

## 2021-09-21 DIAGNOSIS — I480 Paroxysmal atrial fibrillation: Secondary | ICD-10-CM | POA: Diagnosis not present

## 2021-09-21 DIAGNOSIS — N184 Chronic kidney disease, stage 4 (severe): Secondary | ICD-10-CM | POA: Diagnosis not present

## 2021-09-21 LAB — BAYER DCA HB A1C WAIVED: HB A1C (BAYER DCA - WAIVED): 6.9 % — ABNORMAL HIGH (ref 4.8–5.6)

## 2021-09-21 NOTE — Telephone Encounter (Signed)
Called and spoke with patient insurance [employer] and was informed that there wasn't any documentation from the prior call patient received. Patient insurance was requesting a specific medication. There was no specific medication. Patient states she was informed it was all of her medications. Was advised to reach out if patient trouble receiving her medications. Representative verbalized understanding.  ?

## 2021-09-21 NOTE — Assessment & Plan Note (Addendum)
Chronic, ongoing with BP well below goal today on exam with report occasional postural dizziness.  At this time will continue current regimen and adjust as needed based on BP and HR.  At this time continue collaboration with cardiology -- will discuss with them possibility further reduction in future.  Recommend she monitor BP at least a few mornings a week at home and document.  DASH diet at home.  Labs: up to date with nephrology.  Return in 3 months. ? ?

## 2021-09-21 NOTE — Assessment & Plan Note (Signed)
Ongoing, noted on labs post Covid.  Labs up to date with nephrology.  Continue this collaboration, recent notes and labs reviewed. ?

## 2021-09-21 NOTE — Assessment & Plan Note (Signed)
Chronic, ongoing, followed by cardiology.  Continue Eliquis and Metoprolol + collaboration with cardiology team.  Rate controlled on exam today.  Continue to collaborate with CCM team. ?

## 2021-09-21 NOTE — Progress Notes (Signed)
? ?BP 120/68 (BP Location: Left Arm, Patient Position: Sitting, Cuff Size: Large)   Pulse 64   Temp 98.4 ?F (36.9 ?C) (Oral)   Resp 18   Ht '5\' 1"'$  (1.549 m)   Wt 220 lb 9.6 oz (100.1 kg)   SpO2 94%   BMI 41.68 kg/m?   ? ?Subjective:  ? ? Patient ID: Cindy [name], female    DOB: 1951-02-08, 71 y.o.   MRN: [id number] ? ?HPI: ?Cindy [name] is a 71 y.o. female ? ?Chief Complaint  ?Patient presents with  ? Diabetes  ?  Patient recent Diabetic Eye Exam requested at today's visit.   ? Hyperlipidemia  ? Hypertension  ? Atrial Fibrillation  ? Chronic Kidney Disease  ? ?DIABETES ?A1c in January 8.6%.  Taking Jardiance and Ozempic.  Working with CCM team on cost assist with Jardiance and Eliquis.  She reports she was called recently by [facility] was was told she would be able to get all of her medicine for $35, but has number provider office needs to call [phone] (exception tier). ?Hypoglycemic episodes:no ?Polydipsia/polyuria: no ?Visual disturbance: no ?Chest pain: no ?Paresthesias: no ?Glucose Monitoring: yes ?            Accucheck frequency: Daily ?            Fasting glucose: 136-161 ?            Post prandial: ?            Evening: ?            Before meals: ?Taking Insulin?: no ?            Long acting insulin: ?            Short acting insulin: ?Blood Pressure Monitoring: a few times a week ?Retinal Examination: Up to Date -- [facility] ?Foot Exam: Up to Date  ?Diabetic Education: Completed in past ?Pneumovax: Up To Date ?Influenza: Not up to Date - refuses ?Aspirin: no  ?  ?HYPERTENSION / HYPERLIPIDEMIA/A-FIB ?Followed by cardiology, last 07/15/21.  Continues on Eliquis, Atorvastatin, Diltiazem, Lasix, Olmesartan, Bisoprolol, Spironolactone, and Minoxidil.  Uses CPAP every night. ?  ?Had echo 11/26/20 with EF 60%.  Aortic atherosclerosis noted on 07/15/20 imaging.   ?Satisfied with current treatment? yes ?Duration of hypertension: chronic ?BP monitoring frequency: a few times a week ?BP range:  120/60-70 ?BP medication side effects: no ?Duration of hyperlipidemia: chronic ?Cholesterol medication side effects: no ?Cholesterol supplements: none ?Medication compliance: good compliance ?Aspirin: no ?Recent stressors: no ?Recurrent headaches: no ?Visual changes: no ?Palpitations: no ?Dyspnea: ongoing with no worsening ?Chest pain: no ?Lower extremity edema: no ?Dizzy/lightheaded: with position changes at times ? ?CHRONIC KIDNEY DISEASE ?Labs on 08/25/21 with nephrology = CRT 1.62, GFR 34, PTH 145, H/H 10.8/34.3. ?CKD status: controlled ?Medications renally dose:  discussed with patient ?Previous renal evaluation: no ?Pneumovax:  Up To Date ?Influenza Vaccine:  Not up to Date --refuses ? ?DEPRESSION ?Continues on Duloxetine and Buspar daily.  Does endorse having anxiety with family situations and bills, does pray a lot.   ?Mood status: stable ?Satisfied with current treatment?: yes ?Symptom severity: moderate  ?Duration of current treatment : chronic ?Side effects: no ?Medication compliance: good compliance ?Psychotherapy/counseling: none ?Depressed mood: yes ?Anxious mood: no ?Anhedonia: no ?Significant weight loss or gain: no ?Insomnia: none ?Fatigue: no ?Feelings of worthlessness or guilt: no ?Impaired concentration/indecisiveness: no ?Suicidal ideations: no ?Hopelessness: no ?Crying spells: no ? ?  09/21/2021  ?  10:18 AM 06/23/2021  ? 11:34 AM 03/23/2021  ? 11:20 AM 01/19/2021  ?  2:07 PM 12/03/2020  ?  2:42 PM  ?Depression screen PHQ 2/9  ?Decreased Interest '2 3 2 '$ 0 1  ?Down, Depressed, Hopeless '2 2 2 '$ 0 1  ?PHQ - 2 Score '4 5 4 '$ 0 2  ?Altered sleeping 0 0 2  2  ?Tired, decreased energy '2 3 2  1  '$ ?Change in appetite '1 1 2  2  '$ ?Feeling bad or failure about yourself  0 3 2  0  ?Trouble concentrating '2 3 2  '$ 0  ?Moving slowly or fidgety/restless 1 2 0  0  ?Suicidal thoughts 0 0 0  0  ?PHQ-9 Score '10 17 14  7  '$ ?Difficult doing work/chores  Somewhat difficult Somewhat difficult  Somewhat difficult  ?  ? ?  09/21/2021  ?  10:18 AM 06/23/2021  ? 11:34 AM 03/23/2021  ? 11:24 AM  ?GAD 7 : Generalized Anxiety Score  ?Nervous, Anxious, on Edge '2 3 2  '$ ?Control/stop worrying '2 3 2  '$ ?Worry too much - different things '2 3 2  '$ ?Trouble relaxing '1 3 2  '$ ?Restless '1 3 2  '$ ?Easily annoyed or irritable '1 3 2  '$ ?Afraid - awful might happen '1 3 2  '$ ?Total GAD 7 Score '10 21 14  '$ ?Anxiety Difficulty Somewhat difficult  Very difficult  ? ? ?Relevant past medical, surgical, family and social history reviewed and updated as indicated. Interim medical history since our last visit reviewed. ?Allergies and medications reviewed and updated. ? ?Review of Systems  ?Constitutional:  Negative for activity change, appetite change, diaphoresis, fatigue and fever.  ?Respiratory:  Positive for shortness of breath. Negative for cough, chest tightness and wheezing.   ?Cardiovascular:  Negative for chest pain, palpitations and leg swelling.  ?Gastrointestinal: Negative.   ?Endocrine: Negative for cold intolerance, heat intolerance, polydipsia, polyphagia and polyuria.  ?Neurological: Negative.   ?Psychiatric/Behavioral: Negative.    ? ?Per HPI unless specifically indicated above ? ?   ?Objective:  ?  ?BP 120/68 (BP Location: Left Arm, Patient Position: Sitting, Cuff Size: Large)   Pulse 64   Temp 98.4 ?F (36.9 ?C) (Oral)   Resp 18   Ht '5\' 1"'$  (1.549 m)   Wt 220 lb 9.6 oz (100.1 kg)   SpO2 94%   BMI 41.68 kg/m?   ?Wt Readings from Last 3 Encounters:  ?09/21/21 220 lb 9.6 oz (100.1 kg)  ?07/23/21 225 lb (102.1 kg)  ?06/23/21 225 lb 3.2 oz (102.2 kg)  ?  ?Physical Exam ?Vitals and nursing note reviewed.  ?Constitutional:   ?   General: She is awake. She is not in acute distress. ?   Appearance: She is well-developed and well-groomed. She is morbidly obese. She is not ill-appearing or toxic-appearing.  ?HENT:  ?   Head: Normocephalic.  ?   Right Ear: Hearing normal.  ?   Left Ear: Hearing normal.  ?Eyes:  ?   General: Lids are normal.     ?   Right eye: No discharge.     ?    Left eye: No discharge.  ?   Conjunctiva/sclera: Conjunctivae normal.  ?   Pupils: Pupils are equal, round, and reactive to light.  ?Neck:  ?   Thyroid: No thyromegaly.  ?   Vascular: No carotid bruit.  ?Cardiovascular:  ?   Rate and Rhythm: Normal rate and regular rhythm.  ?   Heart sounds: Murmur heard.  ?Systolic  murmur is present with a grade of 2/6.  ?  No gallop.  ?Pulmonary:  ?   Effort: Pulmonary effort is normal. No accessory muscle usage or respiratory distress.  ?   Breath sounds: No decreased breath sounds or wheezing.  ?Abdominal:  ?   General: Bowel sounds are normal. There is no distension.  ?   Palpations: Abdomen is soft.  ?Musculoskeletal:  ?   Cervical back: Normal range of motion and neck supple.  ?   Right lower leg: No edema.  ?   Left lower leg: No edema.  ?Lymphadenopathy:  ?   Cervical: No cervical adenopathy.  ?Skin: ?   General: Skin is warm and dry.  ?Neurological:  ?   Mental Status: She is alert and oriented to person, place, and time.  ?   Deep Tendon Reflexes: Reflexes are normal and symmetric.  ?   Reflex Scores: ?     Brachioradialis reflexes are 2+ on the right side and 2+ on the left side. ?     Patellar reflexes are 2+ on the right side and 2+ on the left side. ?Psychiatric:     ?   Attention and Perception: Attention normal.     ?   Mood and Affect: Mood normal.     ?   Speech: Speech normal.     ?   Behavior: Behavior normal. Behavior is cooperative.     ?   Thought Content: Thought content normal.  ? ?Results for orders placed or performed in visit on 09/21/21  ?Bayer DCA Hb A1c Waived  ?Result Value Ref Range  ? HB A1C (BAYER DCA - WAIVED) 6.9 (H) 4.8 - 5.6 %  ? ?   ?Assessment & Plan:  ? ?Problem List Items Addressed This Visit   ? ?  ? Cardiovascular and Mediastinum  ? Aortic atherosclerosis ([place])  ?  Noted on 07/15/20 imaging.  At this time continue statin and Eliquis daily for prevention.   ? ?  ?  ? Hypertension associated with diabetes ([place])  ?  Chronic, ongoing with BP  well below goal today on exam with report occasional postural dizziness.  At this time will continue current regimen and adjust as needed based on BP and HR.  At this time continue collaboration with cardiology -- will disc

## 2021-09-21 NOTE — Assessment & Plan Note (Signed)
Chronic, ongoing.  Continue collaboration with nephrology and current medication regimen. ?

## 2021-09-21 NOTE — Assessment & Plan Note (Signed)
Chronic, ongoing.  Continue 100% adherence to CPAP use at night. 

## 2021-09-21 NOTE — Assessment & Plan Note (Signed)
Ongoing, chronic.  At this time reduce and renal dose medications as needed + continue collaboration with nephrology.  Labs up to date with nephrology.  Avoid NSAIDs. ?

## 2021-09-21 NOTE — Assessment & Plan Note (Signed)
Related to a-fib and long term Eliquis.  Monitor CBC regularly and monitor skin for bruising or breakdown, notify provider if present. 

## 2021-09-21 NOTE — Assessment & Plan Note (Signed)
Chronic, ongoing.  Denies SI/HI.  Continue Duloxetine at this time, which also offers benefit to chronic pain and mood, + continue Buspar which has offered her benefit to anxiety.  Refills as needed.  Will adjust dosing or add medication as needed.  Return in 3 months. ?

## 2021-09-21 NOTE — Assessment & Plan Note (Signed)
Chronic, ongoing.  Continue current medication regimen and adjust as needed.  Lipid panel up to date. 

## 2021-09-21 NOTE — Telephone Encounter (Signed)
-----   Message from Venita Lick, NP sent at 09/21/2021 11:08 AM EDT ----- ?She reports she was called recently by [facility] was told she would be able to get all of her medicine for $35, but has number provider office needs to call [phone] (exception tier).  Can one of you call to check on this and ensure it is accurate, then call patient with update on this. ? ?

## 2021-09-21 NOTE — Assessment & Plan Note (Signed)
BMI 41.68 with T2DM, HTN, CKD.  Recommended eating smaller high protein, low fat meals more frequently and exercising 30 mins a day 5 times a week with a goal of 10-15lb weight loss in the next 3 months. Patient voiced their understanding and motivation to adhere to these recommendations. ? ?

## 2021-09-21 NOTE — Assessment & Plan Note (Signed)
Noted on 07/15/20 imaging.  At this time continue statin and Eliquis daily for prevention.   ?

## 2021-09-21 NOTE — Assessment & Plan Note (Signed)
Mild mitral and tricuspid regurgitation noted on echo June 2022.  Continue collaboration with cardiology and current medication regimen. 

## 2021-09-21 NOTE — Assessment & Plan Note (Signed)
Chronic, ongoing with A1c 6.9% today, downward trend and urine PRT/CRT ratio 16 August 2021.  Praised for almost 2 point reduction. ?- Continue Jardiance 25 MG and Ozempic 0.5 MG weekly at this time, as seeing significant benefit with this. ?- Maintain off Glimepiride due to her age and risk for hypoglycemia. Plus maintain off Metformin due to kidney health. ?- If ongoing poor control once at MAX doses of GLP1 and SGLT2, then will consider referral to endo, but at this time will continue to work on this in office which patient agrees with. ?- Check BS TID and focus on diabetic diet.    ?- Return in 3 months for A1c -- but alert provider prior to if elevations in BS >300 or <70.   ?

## 2021-09-22 ENCOUNTER — Other Ambulatory Visit: Payer: Self-pay

## 2021-09-22 ENCOUNTER — Telehealth: Payer: Self-pay | Admitting: Nurse Practitioner

## 2021-09-22 MED ORDER — ATORVASTATIN CALCIUM 40 MG PO TABS: 40.0000 mg | ORAL_TABLET | Freq: Every day | ORAL | 4 refills | Status: DC

## 2021-09-22 NOTE — Telephone Encounter (Signed)
Medication Refill - Medication: atorvastatin (LIPITOR) 40 MG tablet ? ?Has the patient contacted their pharmacy? Yes.   ? ?(Agent: If yes, when and what did the pharmacy advise?) ? ?Preferred Pharmacy (with phone number or street name):  ? ?[facility] [place], [place] [facility] Phone:  [phone]  ?Fax:  [phone]  ?  ? ? ?Has the patient been seen for an appointment in the last year OR does the patient have an upcoming appointment? Yes.   ? ?Agent: Please be advised that RX refills may take up to 3 business days. We ask that you follow-up with your pharmacy. ?

## 2021-09-22 NOTE — Telephone Encounter (Signed)
Already refilled 09/22/21. ?

## 2021-09-22 NOTE — Progress Notes (Signed)
Please advise is there anything that I need to do  ?

## 2021-10-06 ENCOUNTER — Telehealth: Payer: Self-pay

## 2021-10-06 NOTE — Progress Notes (Signed)
I have resent patient assistance application for Eliqus to patient's home address. I have highlighted the areas of the forms where she needs to sign. I have also left a note for her to bring back the form once she completes her patient sections of the forms as well.  ? ?Corrie Mckusick, RMA ?Health Concierge ? ?

## 2021-10-14 ENCOUNTER — Telehealth: Payer: Self-pay | Admitting: Nurse Practitioner

## 2021-10-14 NOTE — Telephone Encounter (Signed)
Completed paperwork was faxed back over to [place] per patient's request. Faxed completed confirmation.  ?

## 2021-10-14 NOTE — Telephone Encounter (Signed)
The caregiver for patient brought in [place] Patient Assistance Application to be completed by the provider. Application was placed in the providers folder for completion. ?

## 2021-10-19 ENCOUNTER — Ambulatory Visit (INDEPENDENT_AMBULATORY_CARE_PROVIDER_SITE_OTHER): Payer: Medicare HMO | Admitting: Licensed Clinical Social Worker

## 2021-10-19 DIAGNOSIS — E1159 Type 2 diabetes mellitus with other circulatory complications: Secondary | ICD-10-CM

## 2021-10-19 DIAGNOSIS — M5136 Other intervertebral disc degeneration, lumbar region: Secondary | ICD-10-CM

## 2021-10-19 DIAGNOSIS — N184 Chronic kidney disease, stage 4 (severe): Secondary | ICD-10-CM

## 2021-10-19 DIAGNOSIS — F321 Major depressive disorder, single episode, moderate: Secondary | ICD-10-CM

## 2021-10-21 NOTE — Chronic Care Management (AMB) (Signed)
Chronic Care Management    Clinical Social Work Note  10/21/2021 Name: Cindy [name] MRN: [id number] DOB: Apr 09, 1951  Cindy [name] is a 71 y.o. year old female who is a primary care patient of Cannady, Cindy Faster, NP. The CCM team was consulted to assist the patient with chronic disease management and/or care coordination needs related to: Mental Health Counseling and Resources.   Engaged with patient by telephone for follow up visit in response to provider referral for social work chronic care management and care coordination services.   Consent to Services:  The patient was given information about Chronic Care Management services, agreed to services, and gave verbal consent prior to initiation of services.  Please see initial visit note for detailed documentation.   Patient agreed to services and consent obtained.   Assessment: Review of patient past medical history, allergies, medications, and health status, including review of relevant consultants reports was performed today as part of a comprehensive evaluation and provision of chronic care management and care coordination services.     SDOH (Social Determinants of Health) assessments and interventions performed:    Advanced Directives Status: Not addressed in this encounter.  CCM Care Plan  Allergies  Allergen Reactions   Bupropion Other (See Comments)   Gabapentin Palpitations   Pioglitazone Palpitations   Vicodin [Hydrocodone-Acetaminophen] Other (See Comments)    Reaction: racing heart   Vicoprofen [Hydrocodone-Ibuprofen] Other (See Comments)    Reaction: racing heart    Outpatient Encounter Medications as of 10/19/2021  Medication Sig   acetaminophen (TYLENOL) 650 MG CR tablet Take 650 mg by mouth every 8 (eight) hours as needed for pain.   albuterol (VENTOLIN HFA) 108 (90 Base) MCG/ACT inhaler Inhale 1-2 puffs into the lungs every 6 (six) hours as needed for wheezing or shortness of breath.   apixaban (ELIQUIS) 5 MG  TABS tablet Take 1 tablet (5 mg total) by mouth 2 (two) times daily.   atorvastatin (LIPITOR) 40 MG tablet Take 1 tablet (40 mg total) by mouth daily.   bisoprolol (ZEBETA) 10 MG tablet Take 1 tablet (10 mg total) by mouth 2 (two) times daily.   busPIRone (BUSPAR) 5 MG tablet Take 1 tablet (5 mg total) by mouth 2 (two) times daily.   calcitRIOL (ROCALTROL) 0.25 MCG capsule Take 1 capsule by mouth daily.   cetirizine (ZYRTEC) 10 MG tablet Take 10 mg by mouth daily.   diltiazem (CARDIZEM CD) 360 MG 24 hr capsule Take 360 mg by mouth daily.   DULoxetine (CYMBALTA) 60 MG capsule Take 60 mg by mouth daily.   empagliflozin (JARDIANCE) 25 MG TABS tablet Take 1 tablet (25 mg total) by mouth daily before breakfast.   ferrous sulfate 325 (65 FE) MG tablet Take 1 tablet (325 mg total) by mouth 2 (two) times daily with a meal.   fluticasone (FLONASE) 50 MCG/ACT nasal spray Place 2 sprays into both nostrils daily.   furosemide (LASIX) 80 MG tablet TAKE ONE TABLET BY MOUTH TWICE DAILY FOR FLUID   minoxidil (LONITEN) 10 MG tablet TAKE ONE TABLET BY MOUTH ONCE DAILY FOR BLOOD PRESSURE   montelukast (SINGULAIR) 10 MG tablet Take 1 tablet (10 mg total) by mouth at bedtime.   Multiple Vitamin (MULTIVITAMIN WITH MINERALS) TABS tablet Take 1 tablet by mouth daily.   olmesartan (BENICAR) 20 MG tablet Take 1 tablet (20 mg total) by mouth daily.   omeprazole (PRILOSEC) 20 MG capsule Take 20 mg by mouth daily as needed (heartburn).    Naturita,  0.25 OR 0.5 MG/DOSE, 2 MG/3ML SOPN Inject into the skin.   polyethylene glycol powder (GLYCOLAX/MIRALAX) powder Take 17 g by mouth daily.    Semaglutide,0.25 or 0.'5MG'$ /DOS, (OZEMPIC, 0.25 OR 0.5 MG/DOSE,) 2 MG/1.5ML SOPN Inject 0.5 mg into the skin once a week.   spironolactone (ALDACTONE) 25 MG tablet Take 1 tablet (25 mg total) by mouth daily.   No facility-administered encounter medications on file as of 10/19/2021.    Patient Active Problem List   Diagnosis Date Noted    Left ventricular hypertrophy 06/21/2021   Primary hyperparathyroidism ([place]) 03/14/2021   Post-COVID syndrome 12/03/2020   Recurrent boils 12/03/2020   Morbid obesity ([place]) 12/03/2020   CKD stage 4 due to type 2 diabetes mellitus ([place]) 12/01/2020   Aortic atherosclerosis ([place]) 12/01/2020   Hypertension associated with diabetes ([place]) 07/15/2020   Paroxysmal atrial fibrillation ([place]) 07/15/2020   Chronic pain syndrome 07/15/2020   Depression, major, single episode, moderate ([place]) 07/15/2020   Type 2 diabetes mellitus with morbid obesity ([place]) 07/15/2020   GERD (gastroesophageal reflux disease) 07/15/2020   Hyperlipidemia associated with type 2 diabetes mellitus ([place]) 07/15/2020   OSA (obstructive sleep apnea) 07/15/2020   Other thrombophilia ([place]) 03/12/2020   History of lumbar spinal fusion 04/26/2018   Hypertensive retinopathy of both eyes 06/08/2017   DDD (degenerative disc disease), lumbar 10/31/2014   Cervical spondylosis with myelopathy 02/22/2014   Anemia in chronic kidney disease (CKD) 02/22/2014   Mitral regurgitation 09/08/2013    Conditions to be addressed/monitored: HTN, DMII, CKD Stage 4, and Chronic Pain Syndrome  Care Plan : General Social Work (Adult)  Updates made by Rebekah Chesterfield, LCSW since 10/21/2021 12:00 AM     Problem: Coping Skills (General Plan of Care)      Goal: Coping Skills Enhanced   Start Date: 01/16/2021  Expected End Date: 01/28/2022  This Visit's Progress: On track  Recent Progress: On track  Priority: High  Note:   Current barriers:   Acute Mental Health needs related to depression Mental Health Concerns  Needs Support, Education, and Care Coordination in order to meet unmet mental health needs. Clinical Goal(s): Over the next 120 days, patient will work with SW, counselor and therapist to reduce or manage symptoms of agitation, mood instability, stress, and bipolar until connected for ongoing counseling. Clinical Interventions:  Assessed  patient's previous and current treatment, coping skills, support system and barriers to care  Patient stated "everything has been going good" She is managing chronic health conditions well noting A1C has improved, decreased to 6.9 Patient continues compliance with medications  LCSW commended pt and processed pt's feeling of accomplishment and pride in reaching health goals CCM LCSW provided validation and encouragement. Healthy coping skills were discussed to assist with management of symptoms  Patient has a strong support system Patient walks in the house and participates in exercise to promote positive mood Patient reports compliance with medication management CCM LCSW reviewed upcoming appointments with patient Mindfulness or Relaxation Training, Active listening / Reflection utilized , Emotional Supportive Provided, Provided psychoeducation for mental health needs , Reviewed mental health medications with patient and discussed compliance: , Verbalization of feelings encouraged , and Suicidal Ideation/Homicidal Ideation assessed: Patient denies SI/HI Patient Goals/Self-Care Activities: Over the next 120 days Contact clinic with any questions or concerns Utilize healthy coping skills discussed and continue compliance with meds Attend scheduled appointments with providers       Follow Up Plan: SW will follow up with patient by phone over the next  6-8 weeks      Christa See, MSW, [email or web address] Phone [phone] 7:57 AM

## 2021-10-21 NOTE — Patient Instructions (Signed)
Visit Information  Thank you for taking time to visit with me today. Please don't hesitate to contact me if I can be of assistance to you before our next scheduled telephone appointment.  Following are the goals we discussed today:  Patient Goals/Self-Care Activities: Over the next 120 days Contact clinic with any questions or concerns Utilize healthy coping skills discussed and continue compliance with meds Attend scheduled appointments with providers  Our next appointment is by telephone on 7/5 at 10 AM  Please call the care guide team at [phone] if you need to cancel or reschedule your appointment.   If you are experiencing a Mental Health or [place] or need someone to talk to, please call 911   The patient verbalized understanding of instructions, educational materials, and care plan provided today and DECLINED offer to receive copy of patient instructions, educational materials, and care plan.   Christa See, MSW, [email or web address] Phone [phone] 7:58 AM

## 2021-10-27 ENCOUNTER — Telehealth: Payer: Medicare HMO

## 2021-10-27 ENCOUNTER — Ambulatory Visit: Payer: Self-pay

## 2021-10-27 DIAGNOSIS — F321 Major depressive disorder, single episode, moderate: Secondary | ICD-10-CM

## 2021-10-27 DIAGNOSIS — I480 Paroxysmal atrial fibrillation: Secondary | ICD-10-CM

## 2021-10-27 DIAGNOSIS — E1122 Type 2 diabetes mellitus with diabetic chronic kidney disease: Secondary | ICD-10-CM

## 2021-10-27 DIAGNOSIS — E1159 Type 2 diabetes mellitus with other circulatory complications: Secondary | ICD-10-CM

## 2021-10-27 DIAGNOSIS — G894 Chronic pain syndrome: Secondary | ICD-10-CM

## 2021-10-27 DIAGNOSIS — E1169 Type 2 diabetes mellitus with other specified complication: Secondary | ICD-10-CM

## 2021-10-27 NOTE — Chronic Care Management (AMB) (Signed)
Chronic Care Management   CCM RN Visit Note  10/27/2021 Name: Cindy [name] MRN: [id number] DOB: 1950-09-08  Subjective: Cindy [name] is a 71 y.o. year old female who is a primary care patient of Cannady, Barbaraann Faster, NP. The care management team was consulted for assistance with disease management and care coordination needs.    Engaged with patient by telephone for follow up visit in response to provider referral for case management and/or care coordination services.   Consent to Services:  The patient was given information about Chronic Care Management services, agreed to services, and gave verbal consent prior to initiation of services.  Please see initial visit note for detailed documentation.   Patient agreed to services and verbal consent obtained.   Assessment: Review of patient past medical history, allergies, medications, health status, including review of consultants reports, laboratory and other test data, was performed as part of comprehensive evaluation and provision of chronic care management services.   SDOH (Social Determinants of Health) assessments and interventions performed:    CCM Care Plan  Allergies  Allergen Reactions   Bupropion Other (See Comments)   Gabapentin Palpitations   Pioglitazone Palpitations   Vicodin [Hydrocodone-Acetaminophen] Other (See Comments)    Reaction: racing heart   Vicoprofen [Hydrocodone-Ibuprofen] Other (See Comments)    Reaction: racing heart    Outpatient Encounter Medications as of 10/27/2021  Medication Sig   acetaminophen (TYLENOL) 650 MG CR tablet Take 650 mg by mouth every 8 (eight) hours as needed for pain.   albuterol (VENTOLIN HFA) 108 (90 Base) MCG/ACT inhaler Inhale 1-2 puffs into the lungs every 6 (six) hours as needed for wheezing or shortness of breath.   apixaban (ELIQUIS) 5 MG TABS tablet Take 1 tablet (5 mg total) by mouth 2 (two) times daily.   atorvastatin (LIPITOR) 40 MG tablet Take 1 tablet (40 mg total)  by mouth daily.   bisoprolol (ZEBETA) 10 MG tablet Take 1 tablet (10 mg total) by mouth 2 (two) times daily.   busPIRone (BUSPAR) 5 MG tablet Take 1 tablet (5 mg total) by mouth 2 (two) times daily.   calcitRIOL (ROCALTROL) 0.25 MCG capsule Take 1 capsule by mouth daily.   cetirizine (ZYRTEC) 10 MG tablet Take 10 mg by mouth daily.   diltiazem (CARDIZEM CD) 360 MG 24 hr capsule Take 360 mg by mouth daily.   DULoxetine (CYMBALTA) 60 MG capsule Take 60 mg by mouth daily.   empagliflozin (JARDIANCE) 25 MG TABS tablet Take 1 tablet (25 mg total) by mouth daily before breakfast.   ferrous sulfate 325 (65 FE) MG tablet Take 1 tablet (325 mg total) by mouth 2 (two) times daily with a meal.   fluticasone (FLONASE) 50 MCG/ACT nasal spray Place 2 sprays into both nostrils daily.   furosemide (LASIX) 80 MG tablet TAKE ONE TABLET BY MOUTH TWICE DAILY FOR FLUID   minoxidil (LONITEN) 10 MG tablet TAKE ONE TABLET BY MOUTH ONCE DAILY FOR BLOOD PRESSURE   montelukast (SINGULAIR) 10 MG tablet Take 1 tablet (10 mg total) by mouth at bedtime.   Multiple Vitamin (MULTIVITAMIN WITH MINERALS) TABS tablet Take 1 tablet by mouth daily.   olmesartan (BENICAR) 20 MG tablet Take 1 tablet (20 mg total) by mouth daily.   omeprazole (PRILOSEC) 20 MG capsule Take 20 mg by mouth daily as needed (heartburn).    OZEMPIC, 0.25 OR 0.5 MG/DOSE, 2 MG/3ML SOPN Inject into the skin.   polyethylene glycol powder (GLYCOLAX/MIRALAX) powder Take 17 g by mouth  daily.    Semaglutide,0.25 or 0.5MG/DOS, (OZEMPIC, 0.25 OR 0.5 MG/DOSE,) 2 MG/1.5ML SOPN Inject 0.5 mg into the skin once a week.   spironolactone (ALDACTONE) 25 MG tablet Take 1 tablet (25 mg total) by mouth daily.   No facility-administered encounter medications on file as of 10/27/2021.    Patient Active Problem List   Diagnosis Date Noted   Left ventricular hypertrophy 06/21/2021   Primary hyperparathyroidism ([place]) 03/14/2021   Post-COVID syndrome 12/03/2020   Recurrent  boils 12/03/2020   Morbid obesity ([place]) 12/03/2020   CKD stage 4 due to type 2 diabetes mellitus ([place]) 12/01/2020   Aortic atherosclerosis ([place]) 12/01/2020   Hypertension associated with diabetes ([place]) 07/15/2020   Paroxysmal atrial fibrillation ([place]) 07/15/2020   Chronic pain syndrome 07/15/2020   Depression, major, single episode, moderate ([place]) 07/15/2020   Type 2 diabetes mellitus with morbid obesity ([place]) 07/15/2020   GERD (gastroesophageal reflux disease) 07/15/2020   Hyperlipidemia associated with type 2 diabetes mellitus ([place]) 07/15/2020   OSA (obstructive sleep apnea) 07/15/2020   Other thrombophilia ([place]) 03/12/2020   History of lumbar spinal fusion 04/26/2018   Hypertensive retinopathy of both eyes 06/08/2017   DDD (degenerative disc disease), lumbar 10/31/2014   Cervical spondylosis with myelopathy 02/22/2014   Anemia in chronic kidney disease (CKD) 02/22/2014   Mitral regurgitation 09/08/2013    Conditions to be addressed/monitored:Atrial Fibrillation, HTN, HLD, DMII, CKD Stage 4, Depression, and chronic pain  Care Plan : RNCM: General plan of care for AFIB, HTN, HLD, CKD4, DM, Chronic pain, and Depression  Updates made by Vanita Ingles, RN since 10/27/2021 12:00 AM     Problem: RNCM: General plan of care for AFIB, HTN, HLD, CKD4, DM, Chronic pain, and Depression   Priority: High     Long-Range Goal: RNCM: General plan of care for AFIB, HTN, HLD, CKD4, DM, Chronic pain, and Depression   Start Date: 12/24/2020  Expected End Date: 12/24/2021  Recent Progress: On track  Priority: High  Note:   Current Barriers:  Knowledge Deficits related to plan of care for management of Atrial Fibrillation, HTN, HLD, DMII, CKD Stage 4, Depression, and chronic pain   Care Coordination needs related to Financial constraints related to difficulty in affording medications , Level of care concerns, and Mental Health Concerns  in a patient with Atrial Fibrillation, HTN, HLD, DMII, CKD Stage  4, Depression, and chronic pain  Chronic Disease Management support and education needs related to Atrial Fibrillation, HTN, HLD, DMII, CKD Stage 4, Depression, and chronic pain  Difficulty obtaining medications  RNCM Clinical Goal(s):  Patient will verbalize understanding of plan for management of Atrial Fibrillation, HTN, HLD, DMII, CKD Stage 4, Depression, and chronic pain  work with All embedded care coordination to address needs related to Atrial Fibrillation, HTN, HLD, DMII, CKD Stage 4, Depression, and chronic pain  and Financial constraints related to medication cost  and Mental Health Concerns  take all medications exactly as prescribed and will call provider for medication related questions attend all scheduled medical appointments: 12-21-2021 at 1020 am demonstrate a decrease in Atrial Fibrillation, HTN, HLD, DMII, CKD Stage 4, Depression, and Chronic pain  exacerbations demonstrate improved adherence to prescribed treatment plan for Atrial Fibrillation, HTN, HLD, DMII, CKD Stage 4, Depression, and chronic pain  demonstrate improved health management independence verbalize basic understanding of Atrial Fibrillation, HTN, HLD, DMII, CKD Stage 4, Depression, and Chronic pain  disease process and self health management plan demonstrate understanding of rationale for each prescribed medication  work with [employer] clinical Education officer, museum to help with building coping skills and handle stress over multiple chronic conditions  demonstrate ongoing self health care management ability through collaboration with Consulting civil engineer, provider, and care team.   Interventions: 1:1 collaboration with primary care provider regarding development and update of comprehensive plan of care as evidenced by provider attestation and co-signature Inter-disciplinary care team collaboration (see longitudinal plan of care)   A-fib:  (Status: Goal on track: YES.) Counseled on increased risk of stroke due to Afib and benefits  of anticoagulation for stroke prevention; Reviewed importance of adherence to anticoagulant exactly as prescribed. 01-27-2021: the patient was approved for help with Eliquis until the end of the year. The patient is compliant with medications and is happy that she has received help for the Eliquis as she was in the donut hole. She states getting help with the medication has helped a whole lot. 07-01-2021: The patient is compliant with medications. Has her medications. 08-27-2021: The patient states the Eliquis paperwork was denied because it did not include her out of pocket from pharmacy expense sheet with it. Will reach back out to the pharm D for assistance with additional paperwork needs for patient to receive Eliquis support. 10-27-2021: The patient states that she has filled out the Eliquis paperwork again and is waiting to see if it has been approved. Is working with the pharm D.  Advised patient to discuss changes in AFIB, concerns or questions  with provider; Counseled on bleeding risk associated with Eliquis and importance of self-monitoring for signs/symptoms of bleeding. 10-27-2021: Denies any needs at this time. Will continue to monitor. ; Counseled on avoidance of NSAIDs due to increased bleeding risk with anticoagulants. 10-27-2021: Reviewed again for the patient to avoid NSAIDS; Counseled on importance of regular laboratory monitoring as prescribed. 10-27-2021: Has lab work on a regular basis Counseled on seeking medical attention after a head injury or if there is blood in the urine/stool; Afib action plan reviewed. 10-27-2021: The patient states that her AFIB is stable. Denies any acute findings with her AFIB or heart health at this time;  Diabetes:  (Status: Goal on track: YES.) Lab Results  Component Value Date   HGBA1C 6.9 (H) 09/21/2021  03-23-2021- 7.4 Assessed patient's understanding of A1c goal: <7%. 10-27-2021: The patient is under goal and is happy that she is under goal. Review of  goals of care Provided education to patient about basic DM disease process. 10-27-2021: Review of DM and how to effectively manage. The patient discussed food choices and eating healthy.  Reviewed medications with patient and discussed importance of medication adherence. 01-27-2021: The patient states that she is taking the Jardiance and ozempic as directed. She has been approved for help with the Jardiance until the end of the year. 03-13-2021: The patient states that she is no longer taking the Ozempic, after finishing the samples the patient did not know that she was supposed to continue. Advised the patient that she had refills at [facility]. The patient states she is staking the Metformin and Jardiance but not the Ozempic. Advised the patient to call the pharmacy and inquire about the Ozempic. The patient also has an appointment in the office to see the pcp on 03-18-2021 and advised the patient to discuss this with the pcp. Will collaborate with the pcp and pharm D on plan of care for DM and the patient wanting to know if she is supposed to be taking all the medications for her DM. 07-01-2021:  The patient is taking her Jardiance at 25 mg and has restarted the sample of Ozempic. She has taken 2 weeks worth. Working with the pharm D for assistance with Ozempic and getting Ozempic through medication financial assistance. 08-27-2021: The patient has been approved for her medications through out 2023 and is happy for this. She denies any medication needs related to her DM. 10-27-2021: the patient is doing well and happy that she has her medications through the end of 2023. Will continue to monitor for changes and needs.  Counseled on importance of regular laboratory monitoring as prescribed. 01-27-2021: The patient likely will have new lab work in October. 03-13-2021: Review of most recent lab work and A1C. Education on the goal of A1C being less than 7.0. 03-31-2021: Education on A1C being 7.4% with goal of 7.0.  07-01-2021: On 06-23-2021 A1C increased to 8.6%. The patient is frustrated that her A1C and blood sugars are elevated. Emotional support and education given. Encouraged the patient to talk about her frustrations with the [facility]. The patient has had medication changes and knows that she will see positive changes. 10-27-2021: Has regular lab work. Is under goal and is very happy for this.  Discussed plans with patient for ongoing care management follow up and provided patient with direct contact information for care management team; Provided patient with written educational materials related to hypo and hyperglycemia and importance of correct treatment. 08-27-2021: Review of blood sugars. Denies any lows or highs. Reminded the patient to call the office for blood sugars <70 and >300; 10-27-2021: The patient denies any real lows or highs. States her readings have been consistent in the 120's to 150's Reviewed scheduled/upcoming provider appointments including: 12-21-2021 Advised patient, providing education and rationale, to check cbg bid and record, calling pcp for findings outside established parameters. 01-27-2021: The patient states that her blood sugar this am was 189.  The patient states it has been going up and down. She has started taking the Jardiance again. Education provided that blood sugars should stabilize with taking Jardiance and Ozempic.  Education on continuing to check blood sugars and call for extreme changes in blood sugar readings. 03-13-2021: The patient states that her blood sugars this am was 189. The patient states that she knows if it gets down to 100 it is too low for her. Education on fasting blood sugar goal of <130 and post prandial of <180.  The patient states she takes her blood sugars are various time and if she feels "funny" or different.  Education and support given. 03-31-2021: The patient states that her blood sugar this am was 189 and at lunch it was 242. Education provided on goals of  blood sugar to be <130 fasting and <180 2 hours after eating. The patient verbalized understanding. 07-01-2021: The patient admits she is frustrated because her blood sugars are 197 to 260. She states she is taking her medications like she is supposed to. Reminded the patient of the time it takes for medications to get in her system and to work on taking as directed and dietary changes. Review of fasting goal of <130 and <180 post prandial. Reflective listening and support given. 08-27-2021: The patient states that her blood sugars range is 139 to 161 and doing better. Review of goals again and the patient verbalized understanding. 10-27-2021: The patient states her blood sugars are much better now and she is doing well. She says her range is 121 to 154.  Referral made to pharmacy team for assistance  with obtaining financial assistance with Ozempic and other medications, patient spoke to pharmacist on 12-23-2020. 01-27-2021: Working with the pharmacy team for assistance with medications. 03-13-2021: Collaboration with the pharm D concerning assistance with medications compliance and treatment plan of DM ; 03-31-2021: In basket message being sent to pharm D for assistance with Jardiance question . The patient has about 2 weeks worth. 07-01-2021: The patient is currently working with the pharm D for assistance with medications cost. 08-27-2021: The patient has been approved for DM medications through out the rest of the year and is happy. 10-27-2021: Is working with the pharm D for management of medications and assistance with cost constraints Review of patient status, including review of consultants reports, relevant laboratory and other test results, and medications completed; Advised patient to discuss questions or concern about her DM  with provider, the patient was upset that she "messed up" her Ozempic dose this am and it was a wasted dose. Reflective listening and support given. Education on how to take [place]. Her  daughters came and assisted her and will assist again next week until she is comfortable giving the Ozempic to herself. The patient is a little concerned about the paperwork and feels the insurance will not pay for the Ozempic. Reviewed conversation she had with pharm D on 12-23-2020, per the notes the pharm D is working with the patient to help with financial constraints of medications, including Ozempic. 01-27-2021: the patient is doing well with the Ozempic and is now taking Jardiance again. The patient states she is having boils continue to come up in different places on her body, usually between her breast and buttocks. States she has one on her buttocks now. She has been putting cream from the dermatologist on it and that is helping. Has went to wound clinic before for an infected boil and was treated successfully. The patient wants to know what to do for reoccurring "boils". Advised the patient to follow previous recommendations from the provider and to discuss this also with the pcp on her visit in October. The patient states the cream does take the soreness out. Review of sx and sx of infection and call the office for new onset of fever, chills, or drainage consistent with infection. The patient states that she has not had any sx and sx of infection. Also advised the patient that regulation of blood sugars should also help with occurrence of boils.  Will continue to monitor for changes. 03-13-2021: The patient states she was not aware that she had refills for Ozempic at the pharmacy. Read the provider notes from July to the patient. The patient states she stopped taking the Ozempic when she ran out of the samples. Advised the patient to call the pharmacy and inquire about the refills for Ozempic. Also advised the patient that the [facility] would collaborate with the pcp and pharm D about the recommendations for medication management of her DM. The patient is following up with the pcp next week. Will continue to  monitor. 03-31-2021: the patient saw the pcp recently and has parameters. Review of the notes with the patient. Will continue to monitor for needs or changes. 07-01-2021: Ongoing support and education given to the patient. Will continue to monitor for changes.  Scheduled eye exam for March of 2023 per patient on outreach today (07-01-2021). 10-27-2021: Had her appointment in March and her vision checked out good. She denies any new concerns with her vision  Hyperlipidemia:  (Status: Goal on track: YES.)  Lab Results  Component Value Date   CHOL 157 06/23/2021   HDL 45 06/23/2021   LDLCALC 89 06/23/2021   TRIG 126 06/23/2021       Medication review performed; medication list updated in electronic medical record. 10-27-2021: Takes Lipitor 40 mg for HLD control  Provider established cholesterol goals reviewed. 10-27-2021: The patient is at goal with her cholesterol levels. Praised for good control; Counseled on importance of regular laboratory monitoring as prescribed. 10-27-2021: Has regular lab testing Provided HLD educational materials; Reviewed role and benefits of statin for ASCVD risk reduction; Discussed strategies to manage statin-induced myalgias; Reviewed importance of limiting foods high in cholesterol 10-27-2021: the patient endorses compliance with heart healthy/ADA diet. Did get some food assistance with the help of the care guides. Was appreciative for the food she received.   Hypertension: (Status: Goal on track: YES.) Last practice recorded BP readings:  BP Readings from Last 3 Encounters:  09/21/21 120/68  07/23/21 112/66  06/23/21 (!) 119/57  Most recent eGFR/CrCl:  Lab Results  Component Value Date   EGFR 32 (L) 03/23/2021    No components found for: CRCL  Evaluation of current treatment plan related to hypertension self management and patient's adherence to plan as established by provider. The patient states that sometimes her blood pressure goes lower but states  recently her systolic has been 130 to 865 range and diastolic 50 to 60. Says that she does get light headed some times and dizzy but not recently. Discussed changing position slowly and monitoring for orthostatic hypotension. Explained what orthostatic hypotension was and to be safe when changing positions due to the possibility of blood pressure dropping. 01-27-2021: The patient states she is doing well with management of her HTN. Saw cardiologist recently and pulmonary. Lisinopril discontinued and the patient was started on olmesartan 40 mg. The patient states her cough is better. The patient states the cardiologist may take her off of some of her medications at next visit. States she feels good. 03-13-2021: The patient had ask the LCSW to get the [facility] to call her concerning some questions she had about her blood pressures and it dropping. The patient states that sometimes her blood pressure is 106/55 and she feels like she is dizzy. She states she doesn't know if it is some of the medications she takes because the bottles say may cause dizziness or if it is vertigo which she has had before or her blood pressure dropping. Reviewed in detail about the possibility of orthostatic hypotension and the need to change position slowly because blood pressures could drop lower when changing position if moving too fast. Education to use precaution when going from a lying to a sitting, a sitting to a standing.  Also education on monitoring for sinus changes and possible vertigo. Reminded the patient to be safe and by being dizzy this put her at higher risk of falls. The patient states she is being careful and she does not want to fall. 03-31-2021: The patient saw the pcp recently and medications changes made. The patient states that she is not feeling light headed and dizzy like she was. She feels the medication changes have helped a lot. Review of safety and changing position slowly. 08-27-2021: The patient is having much  better control of blood pressures. Denies any light headedness or dizziness. Will continue to monitor for changes and needs. 10-27-2021: The patient is having more stable blood pressures. Denies any issues with HTN. The patient also states that sometimes her blood  pressure drops. Review of light headedness and dizziness. The patient states that she has this at times. Review of orthostatic hypotension and changing position slowly. She denies any safety concerns or falls at this time. Education and support given.  Provided education to patient re: stroke prevention, s/s of heart attack and stroke; Reviewed medications with patient and discussed importance of compliance. 10-27-2021: States compliance with medications ; Discussed plans with patient for ongoing care management follow up and provided patient with direct contact information for care management team; Advised patient, providing education and rationale, to monitor blood pressure daily and record, calling PCP for findings outside established parameters;  Reviewed scheduled/upcoming provider appointments including: 12-21-2021 Provided education on prescribed diet heart healthy/ADA diet. 10-27-2020: States she is eating well and is compliant with a heart healthy/ADA diet. She is trying to eat more vegetables and salads;  Discussed complications of poorly controlled blood pressure such as heart disease, stroke, circulatory complications, vision complications, kidney impairment, sexual dysfunction;    CKD4 (Status: Goal on track: YES.) Lab Results  Component Value Date   CREATININE 1.70 (H) 03/23/2021   CREATININE 1.61 (H) 12/03/2020   CREATININE 1.99 (H) 11/19/2020  GFR at the kidney specialist was 45 on 08-25-2021  Evaluation of current treatment plan related to CKD Stage 4 ,  self-management and patient's adherence to plan as established by provider. 07-01-2021: Denies any new concerns with kidney function. Sees specialist on a regular basis.  08-27-2021: Saw kidney specialist this week and her kidney function is stable. The specialist does want her to take a class that she will learn about dietary restrictions and chronic conditions and the progression to dialysis when the time comes. The patient states that the specialist office is setting this up and she does not know when this will be. She has one more week to take of the Metoprolol and then she will switch to the bisoprolol. Reviewed with the patient today the dose of 10 mg BID. She will call [facility] and check on the refills as [facility]  has closed. Education and support given. 10-27-2021: Kidney function is stable. The patient is mindful of changes to monitor and watch for. Is having better control of blood pressures. Will continue to monitor for needs.  Discussed plans with patient for ongoing care management follow up and provided patient with direct contact information for care management team Evaluation of current treatment plan related to CKD4 and patient's adherence to plan as established by provider. 01-27-2021: The patient is doing well and denies any issues at this time with her kidneys. States she is feeling much better and thankful her health is better after COVID and hospitalization. Will continue to monitor. 03-31-2021: the patient wanted more information about her CKD4. Education provided so the patient would understand about her kidney functions. Reminded the patient to stay away from NSAIDS and follow the recommendations of the provider. The patient states she will see the specialist this month. Encouraged the patient to write down questions to ask the specialist at her visit. 10-27-2021: Denies any new concerns at this time. Is seeing specialist again in July and will have new blood work and evaluation. Advised patient to call the office for changes in condition, questions or concerns; Provided education to patient re: staying hydrated, eating healthy, pacing activity,  keeping follow up with pcp and specialist; Reviewed medications with patient and discussed compliance. 10-27-2021: Is compliant with medications.; Reviewed scheduled/upcoming provider appointments including 12-21-2021 with the pcp and July with kidney  specialist Discussed plans with patient for ongoing care management follow up and provided patient with direct contact information for care management team;   Depression (Status: Goal on track: YES.) Evaluation of current treatment plan related to Depression, Mental Health Concerns  self-management and patient's adherence to plan as established by provider. 07-01-2021: The patient was frustrated today and was able to get her feelings out. She states that she is frustrated because of her blood sugars and then there are problems with their septic tank and she says the insurance will not pay for anything. She says it is going to cost thousands of dollars and they don't have that kind of money. The patient allowed to express her concerns. Empathetic listening and support given. 08-27-2021: The patient is doing well and feels good. Denies any depression or changes in mental health at this time. Knows she has good support from the CCM. 10-27-2021: The patient is doing well and feels great. Was well pleased with her MD visit in April with the pcp. Is mindful of what she is eating and feels like she is overall doing very well. Is compliant with the plan of care.  Discussed plans with patient for ongoing care management follow up and provided patient with direct contact information for care management team Evaluation of current treatment plan related to depression  and patient's adherence to plan as established by provider. 01-27-2021: The patient is doing well and just celebrated her 70st birthday. Her family took her out on Sunday and she had a good day on her actual birthday yesterday. The patient states she is doing much better and happy that her health is improving.  Denies any issues with depression at this time. Will continue to monitor. 03-31-2021: Doing well denies any new concerns with depression. 08-27-2021: Encouraged the patient to talk about her feelings and how being stressed out and worried can negatively impact her health and well being. The patient has a good support system and is thankful for her family, friends and the CCM team. Will continue to work with the patient to help with effective management of her health and well being.  Advised patient to call the office for changes in mood, anxiety, or increased episodes of depression; Provided education to patient re: working with CCM team to effectively manage her depression; Reviewed medications with patient and discussed compliance. 10-27-2021: The patient is compliant with medications; Reviewed scheduled/upcoming provider appointments including: 12-21-2021 with the pcp Social Work referral for assistance with coping skills and recommendations for effective management of depression. 08-27-2021: Actively working with the [employer] for ongoing support and education; Discussed plans with patient for ongoing care management follow up and provided patient with direct contact information for care management team; Screening for signs and symptoms of depression related to chronic disease state;  Assessed social determinant of health barriers;   Pain:  (Status: Goal on track: YES.) Pain assessment performed. 07-01-2021: Denies any pain at this time. States when she does have pain she uses her Tylenol arthritis. 08-27-2021: States that her pain level is at an 8 today on a scale of 0-10 in her bilateral legs. Sometimes she has pain in her shoulders. 10-27-2021: The patient states she is having pain in her shoulders and knees bilaterally. Rates them at a 7 today on a scale of 0-10. The patient states she takes Takes Tylenol if needed and uses pain rubs. Denies any acute findings today. Medications reviewed. 08-27-2021: Is  compliant with her medications. She takes Tylenol and this helps.  Did discuss OTC medications such as Volteran gel to try.  Reviewed provider established plan for pain management. 03-31-2021: The patient is using Tylenol arthritis when she needs to for pain. States pain varies daily. Denies any pain today. Knows what to do for flare ups. States her butt cheek hurts sometimes and she thinks it is from "sciatica". Denies any acute distress. Will continue to monitor. 08-27-2021: Encouraged the patient to talk to the pcp about her leg pain and discomfort and ask for recommendations. She states that she cannot take gabapentin because it makes her heart race. Education and support given. Discussed importance of adherence to all scheduled medical appointments. Pcp appointment 12-21-2021 Counseled on the importance of reporting any/all new or changed pain symptoms or management strategies to pain management provider; Advised patient to report to care team affect of pain on daily activities; Discussed use of relaxation techniques and/or diversional activities to assist with pain reduction (distraction, imagery, relaxation, massage, acupressure, TENS, heat, and cold application. 08-27-2021 Education on using ted hose or compression socks to help with any neuropathy pain and elevate legs when sitting down.  Reviewed with patient prescribed pharmacological and nonpharmacological pain relief strategies; Advised patient to discuss increased level or intensity of pain or uncontrolled pain  with provider;  Patient Goals/Self-Care Activities: Patient will self administer medications as prescribed Patient will attend all scheduled provider appointments Patient will call pharmacy for medication refills Patient will attend church or other social activities Patient will continue to perform ADL's independently Patient will continue to perform IADL's independently Patient will call provider office for new concerns or  questions       Plan:Telephone follow up appointment with care management team member scheduled for:  01-05-2022 at 230 pm  [place], MSN, [facility] Mobile: [phone]

## 2021-10-27 NOTE — Patient Instructions (Signed)
Visit Information  Thank you for taking time to visit with me today. Please don't hesitate to contact me if I can be of assistance to you before our next scheduled telephone appointment.  Following are the goals we discussed today:  (A-fib:  (Status: Goal on track: YES.) Counseled on increased risk of stroke due to Afib and benefits of anticoagulation for stroke prevention; Reviewed importance of adherence to anticoagulant exactly as prescribed. 01-27-2021: the patient was approved for help with Eliquis until the end of the year. The patient is compliant with medications and is happy that she has received help for the Eliquis as she was in the donut hole. She states getting help with the medication has helped a whole lot. 07-01-2021: The patient is compliant with medications. Has her medications. 08-27-2021: The patient states the Eliquis paperwork was denied because it did not include her out of pocket from pharmacy expense sheet with it. Will reach back out to the pharm D for assistance with additional paperwork needs for patient to receive Eliquis support. 10-27-2021: The patient states that she has filled out the Eliquis paperwork again and is waiting to see if it has been approved. Is working with the pharm D.  Advised patient to discuss changes in AFIB, concerns or questions  with provider; Counseled on bleeding risk associated with Eliquis and importance of self-monitoring for signs/symptoms of bleeding. 10-27-2021: Denies any needs at this time. Will continue to monitor. ; Counseled on avoidance of NSAIDs due to increased bleeding risk with anticoagulants. 10-27-2021: Reviewed again for the patient to avoid NSAIDS; Counseled on importance of regular laboratory monitoring as prescribed. 10-27-2021: Has lab work on a regular basis Counseled on seeking medical attention after a head injury or if there is blood in the urine/stool; Afib action plan reviewed. 10-27-2021: The patient states that her AFIB is  stable. Denies any acute findings with her AFIB or heart health at this time;   Diabetes:  (Status: Goal on track: YES.)      Lab Results  Component Value Date    HGBA1C 6.9 (H) 09/21/2021  03-23-2021- 7.4 Assessed patient's understanding of A1c goal: <7%. 10-27-2021: The patient is under goal and is happy that she is under goal. Review of goals of care Provided education to patient about basic DM disease process. 10-27-2021: Review of DM and how to effectively manage. The patient discussed food choices and eating healthy.  Reviewed medications with patient and discussed importance of medication adherence. 01-27-2021: The patient states that she is taking the Jardiance and ozempic as directed. She has been approved for help with the Jardiance until the end of the year. 03-13-2021: The patient states that she is no longer taking the Ozempic, after finishing the samples the patient did not know that she was supposed to continue. Advised the patient that she had refills at [facility]. The patient states she is staking the Metformin and Jardiance but not the Ozempic. Advised the patient to call the pharmacy and inquire about the Ozempic. The patient also has an appointment in the office to see the pcp on 03-18-2021 and advised the patient to discuss this with the pcp. Will collaborate with the pcp and pharm D on plan of care for DM and the patient wanting to know if she is supposed to be taking all the medications for her DM. 07-01-2021: The patient is taking her Jardiance at 25 mg and has restarted the sample of Ozempic. She has taken 2 weeks worth. Working with the  pharm D for assistance with Ozempic and getting Ozempic through medication financial assistance. 08-27-2021: The patient has been approved for her medications through out 2023 and is happy for this. She denies any medication needs related to her DM. 10-27-2021: the patient is doing well and happy that she has her medications through the end of 2023. Will  continue to monitor for changes and needs.  Counseled on importance of regular laboratory monitoring as prescribed. 01-27-2021: The patient likely will have new lab work in October. 03-13-2021: Review of most recent lab work and A1C. Education on the goal of A1C being less than 7.0. 03-31-2021: Education on A1C being 7.4% with goal of 7.0. 07-01-2021: On 06-23-2021 A1C increased to 8.6%. The patient is frustrated that her A1C and blood sugars are elevated. Emotional support and education given. Encouraged the patient to talk about her frustrations with the [facility]. The patient has had medication changes and knows that she will see positive changes. 10-27-2021: Has regular lab work. Is under goal and is very happy for this.  Discussed plans with patient for ongoing care management follow up and provided patient with direct contact information for care management team; Provided patient with written educational materials related to hypo and hyperglycemia and importance of correct treatment. 08-27-2021: Review of blood sugars. Denies any lows or highs. Reminded the patient to call the office for blood sugars <70 and >300; 10-27-2021: The patient denies any real lows or highs. States her readings have been consistent in the 120's to 150's Reviewed scheduled/upcoming provider appointments including: 12-21-2021 Advised patient, providing education and rationale, to check cbg bid and record, calling pcp for findings outside established parameters. 01-27-2021: The patient states that her blood sugar this am was 189.  The patient states it has been going up and down. She has started taking the Jardiance again. Education provided that blood sugars should stabilize with taking Jardiance and Ozempic.  Education on continuing to check blood sugars and call for extreme changes in blood sugar readings. 03-13-2021: The patient states that her blood sugars this am was 189. The patient states that she knows if it gets down to 100 it is too  low for her. Education on fasting blood sugar goal of <130 and post prandial of <180.  The patient states she takes her blood sugars are various time and if she feels "funny" or different.  Education and support given. 03-31-2021: The patient states that her blood sugar this am was 189 and at lunch it was 242. Education provided on goals of blood sugar to be <130 fasting and <180 2 hours after eating. The patient verbalized understanding. 07-01-2021: The patient admits she is frustrated because her blood sugars are 197 to 260. She states she is taking her medications like she is supposed to. Reminded the patient of the time it takes for medications to get in her system and to work on taking as directed and dietary changes. Review of fasting goal of <130 and <180 post prandial. Reflective listening and support given. 08-27-2021: The patient states that her blood sugars range is 139 to 161 and doing better. Review of goals again and the patient verbalized understanding. 10-27-2021: The patient states her blood sugars are much better now and she is doing well. She says her range is 121 to 154.  Referral made to pharmacy team for assistance with obtaining financial assistance with Ozempic and other medications, patient spoke to pharmacist on 12-23-2020. 01-27-2021: Working with the pharmacy team for assistance with medications.  03-13-2021: Collaboration with the pharm D concerning assistance with medications compliance and treatment plan of DM ; 03-31-2021: In basket message being sent to pharm D for assistance with Jardiance question . The patient has about 2 weeks worth. 07-01-2021: The patient is currently working with the pharm D for assistance with medications cost. 08-27-2021: The patient has been approved for DM medications through out the rest of the year and is happy. 10-27-2021: Is working with the pharm D for management of medications and assistance with cost constraints Review of patient status, including review of  consultants reports, relevant laboratory and other test results, and medications completed; Advised patient to discuss questions or concern about her DM  with provider, the patient was upset that she "messed up" her Ozempic dose this am and it was a wasted dose. Reflective listening and support given. Education on how to take [place]. Her daughters came and assisted her and will assist again next week until she is comfortable giving the Ozempic to herself. The patient is a little concerned about the paperwork and feels the insurance will not pay for the Ozempic. Reviewed conversation she had with pharm D on 12-23-2020, per the notes the pharm D is working with the patient to help with financial constraints of medications, including Ozempic. 01-27-2021: the patient is doing well with the Ozempic and is now taking Jardiance again. The patient states she is having boils continue to come up in different places on her body, usually between her breast and buttocks. States she has one on her buttocks now. She has been putting cream from the dermatologist on it and that is helping. Has went to wound clinic before for an infected boil and was treated successfully. The patient wants to know what to do for reoccurring "boils". Advised the patient to follow previous recommendations from the provider and to discuss this also with the pcp on her visit in October. The patient states the cream does take the soreness out. Review of sx and sx of infection and call the office for new onset of fever, chills, or drainage consistent with infection. The patient states that she has not had any sx and sx of infection. Also advised the patient that regulation of blood sugars should also help with occurrence of boils.  Will continue to monitor for changes. 03-13-2021: The patient states she was not aware that she had refills for Ozempic at the pharmacy. Read the provider notes from July to the patient. The patient states she stopped taking the  Ozempic when she ran out of the samples. Advised the patient to call the pharmacy and inquire about the refills for Ozempic. Also advised the patient that the [facility] would collaborate with the pcp and pharm D about the recommendations for medication management of her DM. The patient is following up with the pcp next week. Will continue to monitor. 03-31-2021: the patient saw the pcp recently and has parameters. Review of the notes with the patient. Will continue to monitor for needs or changes. 07-01-2021: Ongoing support and education given to the patient. Will continue to monitor for changes.  Scheduled eye exam for March of 2023 per patient on outreach today (07-01-2021). 10-27-2021: Had her appointment in March and her vision checked out good. She denies any new concerns with her vision   Hyperlipidemia:  (Status: Goal on track: YES.)            Lab Results  Component Value Date    CHOL 157 06/23/2021  HDL 45 06/23/2021    LDLCALC 89 06/23/2021    TRIG 126 06/23/2021        Medication review performed; medication list updated in electronic medical record. 10-27-2021: Takes Lipitor 40 mg for HLD control  Provider established cholesterol goals reviewed. 10-27-2021: The patient is at goal with her cholesterol levels. Praised for good control; Counseled on importance of regular laboratory monitoring as prescribed. 10-27-2021: Has regular lab testing Provided HLD educational materials; Reviewed role and benefits of statin for ASCVD risk reduction; Discussed strategies to manage statin-induced myalgias; Reviewed importance of limiting foods high in cholesterol 10-27-2021: the patient endorses compliance with heart healthy/ADA diet. Did get some food assistance with the help of the care guides. Was appreciative for the food she received.    Hypertension: (Status: Goal on track: YES.) Last practice recorded BP readings:     BP Readings from Last 3 Encounters:  09/21/21 120/68  07/23/21 112/66   06/23/21 (!) 119/57  Most recent eGFR/CrCl:       Lab Results  Component Value Date    EGFR 32 (L) 03/23/2021    No components found for: CRCL   Evaluation of current treatment plan related to hypertension self management and patient's adherence to plan as established by provider. The patient states that sometimes her blood pressure goes lower but states recently her systolic has been 161 to 096 range and diastolic 50 to 60. Says that she does get light headed some times and dizzy but not recently. Discussed changing position slowly and monitoring for orthostatic hypotension. Explained what orthostatic hypotension was and to be safe when changing positions due to the possibility of blood pressure dropping. 01-27-2021: The patient states she is doing well with management of her HTN. Saw cardiologist recently and pulmonary. Lisinopril discontinued and the patient was started on olmesartan 40 mg. The patient states her cough is better. The patient states the cardiologist may take her off of some of her medications at next visit. States she feels good. 03-13-2021: The patient had ask the LCSW to get the [facility] to call her concerning some questions she had about her blood pressures and it dropping. The patient states that sometimes her blood pressure is 106/55 and she feels like she is dizzy. She states she doesn't know if it is some of the medications she takes because the bottles say may cause dizziness or if it is vertigo which she has had before or her blood pressure dropping. Reviewed in detail about the possibility of orthostatic hypotension and the need to change position slowly because blood pressures could drop lower when changing position if moving too fast. Education to use precaution when going from a lying to a sitting, a sitting to a standing.  Also education on monitoring for sinus changes and possible vertigo. Reminded the patient to be safe and by being dizzy this put her at higher risk of falls.  The patient states she is being careful and she does not want to fall. 03-31-2021: The patient saw the pcp recently and medications changes made. The patient states that she is not feeling light headed and dizzy like she was. She feels the medication changes have helped a lot. Review of safety and changing position slowly. 08-27-2021: The patient is having much better control of blood pressures. Denies any light headedness or dizziness. Will continue to monitor for changes and needs. 10-27-2021: The patient is having more stable blood pressures. Denies any issues with HTN. The patient also states that sometimes her  blood pressure drops. Review of light headedness and dizziness. The patient states that she has this at times. Review of orthostatic hypotension and changing position slowly. She denies any safety concerns or falls at this time. Education and support given.  Provided education to patient re: stroke prevention, s/s of heart attack and stroke; Reviewed medications with patient and discussed importance of compliance. 10-27-2021: States compliance with medications ; Discussed plans with patient for ongoing care management follow up and provided patient with direct contact information for care management team; Advised patient, providing education and rationale, to monitor blood pressure daily and record, calling PCP for findings outside established parameters;  Reviewed scheduled/upcoming provider appointments including: 12-21-2021 Provided education on prescribed diet heart healthy/ADA diet. 10-27-2020: States she is eating well and is compliant with a heart healthy/ADA diet. She is trying to eat more vegetables and salads;  Discussed complications of poorly controlled blood pressure such as heart disease, stroke, circulatory complications, vision complications, kidney impairment, sexual dysfunction;     CKD4 (Status: Goal on track: YES.)      Lab Results  Component Value Date    CREATININE 1.70 (H)  03/23/2021    CREATININE 1.61 (H) 12/03/2020    CREATININE 1.99 (H) 11/19/2020  GFR at the kidney specialist was 45 on 08-25-2021  Evaluation of current treatment plan related to CKD Stage 4 ,  self-management and patient's adherence to plan as established by provider. 07-01-2021: Denies any new concerns with kidney function. Sees specialist on a regular basis. 08-27-2021: Saw kidney specialist this week and her kidney function is stable. The specialist does want her to take a class that she will learn about dietary restrictions and chronic conditions and the progression to dialysis when the time comes. The patient states that the specialist office is setting this up and she does not know when this will be. She has one more week to take of the Metoprolol and then she will switch to the bisoprolol. Reviewed with the patient today the dose of 10 mg BID. She will call [facility] and check on the refills as [facility]  has closed. Education and support given. 10-27-2021: Kidney function is stable. The patient is mindful of changes to monitor and watch for. Is having better control of blood pressures. Will continue to monitor for needs.  Discussed plans with patient for ongoing care management follow up and provided patient with direct contact information for care management team Evaluation of current treatment plan related to CKD4 and patient's adherence to plan as established by provider. 01-27-2021: The patient is doing well and denies any issues at this time with her kidneys. States she is feeling much better and thankful her health is better after COVID and hospitalization. Will continue to monitor. 03-31-2021: the patient wanted more information about her CKD4. Education provided so the patient would understand about her kidney functions. Reminded the patient to stay away from NSAIDS and follow the recommendations of the provider. The patient states she will see the specialist this month. Encouraged the  patient to write down questions to ask the specialist at her visit. 10-27-2021: Denies any new concerns at this time. Is seeing specialist again in July and will have new blood work and evaluation. Advised patient to call the office for changes in condition, questions or concerns; Provided education to patient re: staying hydrated, eating healthy, pacing activity, keeping follow up with pcp and specialist; Reviewed medications with patient and discussed compliance. 10-27-2021: Is compliant with medications.; Reviewed scheduled/upcoming provider  appointments including 12-21-2021 with the pcp and July with kidney specialist Discussed plans with patient for ongoing care management follow up and provided patient with direct contact information for care management team;    Depression (Status: Goal on track: YES.) Evaluation of current treatment plan related to Depression, Mental Health Concerns  self-management and patient's adherence to plan as established by provider. 07-01-2021: The patient was frustrated today and was able to get her feelings out. She states that she is frustrated because of her blood sugars and then there are problems with their septic tank and she says the insurance will not pay for anything. She says it is going to cost thousands of dollars and they don't have that kind of money. The patient allowed to express her concerns. Empathetic listening and support given. 08-27-2021: The patient is doing well and feels good. Denies any depression or changes in mental health at this time. Knows she has good support from the CCM. 10-27-2021: The patient is doing well and feels great. Was well pleased with her MD visit in April with the pcp. Is mindful of what she is eating and feels like she is overall doing very well. Is compliant with the plan of care.  Discussed plans with patient for ongoing care management follow up and provided patient with direct contact information for care management  team Evaluation of current treatment plan related to depression  and patient's adherence to plan as established by provider. 01-27-2021: The patient is doing well and just celebrated her 70st birthday. Her family took her out on Sunday and she had a good day on her actual birthday yesterday. The patient states she is doing much better and happy that her health is improving. Denies any issues with depression at this time. Will continue to monitor. 03-31-2021: Doing well denies any new concerns with depression. 08-27-2021: Encouraged the patient to talk about her feelings and how being stressed out and worried can negatively impact her health and well being. The patient has a good support system and is thankful for her family, friends and the CCM team. Will continue to work with the patient to help with effective management of her health and well being.  Advised patient to call the office for changes in mood, anxiety, or increased episodes of depression; Provided education to patient re: working with CCM team to effectively manage her depression; Reviewed medications with patient and discussed compliance. 10-27-2021: The patient is compliant with medications; Reviewed scheduled/upcoming provider appointments including: 12-21-2021 with the pcp Social Work referral for assistance with coping skills and recommendations for effective management of depression. 08-27-2021: Actively working with the [facility] [employer] for ongoing support and education; Discussed plans with patient for ongoing care management follow up and provided patient with direct contact information for care management team; Screening for signs and symptoms of depression related to chronic disease state;  Assessed social determinant of health barriers;    Pain:  (Status: Goal on track: YES.) Pain assessment performed. 07-01-2021: Denies any pain at this time. States when she does have pain she uses her Tylenol arthritis. 08-27-2021: States that her pain level is at  an 8 today on a scale of 0-10 in her bilateral legs. Sometimes she has pain in her shoulders. 10-27-2021: The patient states she is having pain in her shoulders and knees bilaterally. Rates them at a 7 today on a scale of 0-10. The patient states she takes Takes Tylenol if needed and uses pain rubs. Denies any acute findings today. Medications reviewed.  08-27-2021: Is compliant with her medications. She takes Tylenol and this helps. Did discuss OTC medications such as Volteran gel to try.  Reviewed provider established plan for pain management. 03-31-2021: The patient is using Tylenol arthritis when she needs to for pain. States pain varies daily. Denies any pain today. Knows what to do for flare ups. States her butt cheek hurts sometimes and she thinks it is from "sciatica". Denies any acute distress. Will continue to monitor. 08-27-2021: Encouraged the patient to talk to the pcp about her leg pain and discomfort and ask for recommendations. She states that she cannot take gabapentin because it makes her heart race. Education and support given. Discussed importance of adherence to all scheduled medical appointments. Pcp appointment 12-21-2021 Counseled on the importance of reporting any/all new or changed pain symptoms or management strategies to pain management provider; Advised patient to report to care team affect of pain on daily activities; Discussed use of relaxation techniques and/or diversional activities to assist with pain reduction (distraction, imagery, relaxation, massage, acupressure, TENS, heat, and cold application. 08-27-2021 Education on using ted hose or compression socks to help with any neuropathy pain and elevate legs when sitting down.  Reviewed with patient prescribed pharmacological and nonpharmacological pain relief strategies; Advised patient to discuss increased level or intensity of pain or uncontrolled pain  with provider;    Our next appointment is by telephone on 01-05-2022 at 230  pm  Please call the care guide team at [phone] if you need to cancel or reschedule your appointment.   If you are experiencing a Mental Health or [place] or need someone to talk to, please call the Suicide and Crisis Lifeline: 988 call the [place] National Suicide Prevention Lifeline: 930-387-5733 or TTY: 435-498-0110 TTY 910-342-8338) to talk to a trained counselor call 1-800-273-TALK (toll free, 24 hour hotline)   The patient verbalized understanding of instructions, educational materials, and care plan provided today and DECLINED offer to receive copy of patient instructions, educational materials, and care plan.   Noreene Larsson RN, MSN, [facility] Mobile: [phone]

## 2021-10-28 DIAGNOSIS — I129 Hypertensive chronic kidney disease with stage 1 through stage 4 chronic kidney disease, or unspecified chronic kidney disease: Secondary | ICD-10-CM | POA: Diagnosis not present

## 2021-10-28 DIAGNOSIS — I4891 Unspecified atrial fibrillation: Secondary | ICD-10-CM

## 2021-10-28 DIAGNOSIS — F17220 Nicotine dependence, chewing tobacco, uncomplicated: Secondary | ICD-10-CM

## 2021-10-28 DIAGNOSIS — F32A Depression, unspecified: Secondary | ICD-10-CM

## 2021-10-28 DIAGNOSIS — N184 Chronic kidney disease, stage 4 (severe): Secondary | ICD-10-CM

## 2021-10-28 DIAGNOSIS — E785 Hyperlipidemia, unspecified: Secondary | ICD-10-CM

## 2021-10-28 DIAGNOSIS — E1122 Type 2 diabetes mellitus with diabetic chronic kidney disease: Secondary | ICD-10-CM

## 2021-11-10 ENCOUNTER — Telehealth: Payer: Self-pay | Admitting: Nurse Practitioner

## 2021-11-10 NOTE — Telephone Encounter (Signed)
Copied from [place]. Topic: General - Inquiry >> Nov 10, 2021  3:06 PM Erskine Squibb wrote: Reason for CRM: Patient called in checking on status of her eloquis '5mg'$ . She says she hasn't received it or even heard about it. See says that someone from the office was looking into helping her was the cost because it keeps increasing. Please assist patient further

## 2021-11-11 NOTE — Telephone Encounter (Signed)
Called and spoke to the patient. Advised her of Jolene's message and let her know that [name] and his team were looking to the patient assistance.   2 boxes of Eliquis signed out for the patient. She states that her daughter, Cindy [name], will be picking up her medication.

## 2021-11-12 ENCOUNTER — Telehealth: Payer: Self-pay

## 2021-11-12 NOTE — Progress Notes (Cosign Needed)
I have contacted [place] patient assistance program. The representative stated that the patient has been denied, in ordered to re-consider her application the patient will need to spend 3% of her annual income on her medications if she has she will need to send proof of that since January of this year until current date. Also confirm if patient files taxes if she does they will need to send proof of the first 2 pages of tax return.  Cindy [name], [place]

## 2021-11-18 DIAGNOSIS — R0609 Other forms of dyspnea: Secondary | ICD-10-CM | POA: Diagnosis not present

## 2021-11-18 DIAGNOSIS — Z7901 Long term (current) use of anticoagulants: Secondary | ICD-10-CM | POA: Diagnosis not present

## 2021-11-18 DIAGNOSIS — I480 Paroxysmal atrial fibrillation: Secondary | ICD-10-CM | POA: Diagnosis not present

## 2021-11-18 DIAGNOSIS — I517 Cardiomegaly: Secondary | ICD-10-CM | POA: Diagnosis not present

## 2021-11-19 NOTE — Progress Notes (Signed)
[place] to ask how much patient has spent on copays out of pocket. Pharmacist stated that patient has spent $522.58. Called patient, lvm to return call.   [place] Pharmacist Assistant [phone]

## 2021-11-23 ENCOUNTER — Telehealth: Payer: Self-pay | Admitting: Nurse Practitioner

## 2021-11-23 NOTE — Telephone Encounter (Signed)
Cindy [name] brought in  Programme researcher, broadcasting/film/video for [employer]. Paperwork was placed in [employer]

## 2021-12-02 ENCOUNTER — Ambulatory Visit (INDEPENDENT_AMBULATORY_CARE_PROVIDER_SITE_OTHER): Payer: Medicare HMO | Admitting: Licensed Clinical Social Worker

## 2021-12-02 ENCOUNTER — Other Ambulatory Visit: Payer: Self-pay | Admitting: Nurse Practitioner

## 2021-12-02 DIAGNOSIS — F321 Major depressive disorder, single episode, moderate: Secondary | ICD-10-CM

## 2021-12-02 DIAGNOSIS — G894 Chronic pain syndrome: Secondary | ICD-10-CM

## 2021-12-02 DIAGNOSIS — E1159 Type 2 diabetes mellitus with other circulatory complications: Secondary | ICD-10-CM

## 2021-12-02 DIAGNOSIS — E1169 Type 2 diabetes mellitus with other specified complication: Secondary | ICD-10-CM

## 2021-12-02 MED ORDER — OZEMPIC (0.25 OR 0.5 MG/DOSE) 2 MG/1.5ML ~~LOC~~ SOPN: 0.5000 mg | PEN_INJECTOR | SUBCUTANEOUS | 4 refills | Status: DC

## 2021-12-02 NOTE — Patient Instructions (Signed)
Visit Information  Thank you for taking time to visit with me today. Please don't hesitate to contact me if I can be of assistance to you before our next scheduled telephone appointment.  Following are the goals we discussed today:  Patient Goals/Self-Care Activities: Over the next 120 days Contact clinic with any questions or concerns Utilize healthy coping skills discussed and continue compliance with meds Attend scheduled appointments with providers  If you are experiencing a Mental Health or [place] or need someone to talk to, please call the Suicide and Crisis Lifeline: 988 call 911   The patient verbalized understanding of instructions, educational materials, and care plan provided today and DECLINED offer to receive copy of patient instructions, educational materials, and care plan.   No follow up required. Pt completed care plan goals  Christa See, MSW, [email or web address] Phone [phone] 1:40 PM

## 2021-12-02 NOTE — Chronic Care Management (AMB) (Signed)
Chronic Care Management    Clinical Social Work Note  12/02/2021 Name: Cindy [name] MRN: [id number] DOB: 10-Apr-1951  Cindy [name] is a 71 y.o. year old female who is a primary care patient of Cannady, Barbaraann Faster, NP. The CCM team was consulted to assist the patient with chronic disease management and/or care coordination needs related to: Mental Health Counseling and Resources.   Engaged with patient by telephone for follow up visit in response to provider referral for social work chronic care management and care coordination services.   Consent to Services:  The patient was given information about Chronic Care Management services, agreed to services, and gave verbal consent prior to initiation of services.  Please see initial visit note for detailed documentation.   Patient agreed to services and consent obtained.   Assessment: Review of patient past medical history, allergies, medications, and health status, including review of relevant consultants reports was performed today as part of a comprehensive evaluation and provision of chronic care management and care coordination services.     SDOH (Social Determinants of Health) assessments and interventions performed:    Advanced Directives Status: Not addressed in this encounter.  CCM Care Plan  Allergies  Allergen Reactions   Bupropion Other (See Comments)   Gabapentin Palpitations   Pioglitazone Palpitations   Vicodin [Hydrocodone-Acetaminophen] Other (See Comments)    Reaction: racing heart   Vicoprofen [Hydrocodone-Ibuprofen] Other (See Comments)    Reaction: racing heart    Outpatient Encounter Medications as of 12/02/2021  Medication Sig   acetaminophen (TYLENOL) 650 MG CR tablet Take 650 mg by mouth every 8 (eight) hours as needed for pain.   albuterol (VENTOLIN HFA) 108 (90 Base) MCG/ACT inhaler Inhale 1-2 puffs into the lungs every 6 (six) hours as needed for wheezing or shortness of breath.   apixaban (ELIQUIS) 5 MG  TABS tablet Take 1 tablet (5 mg total) by mouth 2 (two) times daily.   atorvastatin (LIPITOR) 40 MG tablet Take 1 tablet (40 mg total) by mouth daily.   bisoprolol (ZEBETA) 10 MG tablet Take 1 tablet (10 mg total) by mouth 2 (two) times daily.   busPIRone (BUSPAR) 5 MG tablet Take 1 tablet (5 mg total) by mouth 2 (two) times daily.   calcitRIOL (ROCALTROL) 0.25 MCG capsule Take 1 capsule by mouth daily.   cetirizine (ZYRTEC) 10 MG tablet Take 10 mg by mouth daily.   diltiazem (CARDIZEM CD) 360 MG 24 hr capsule Take 360 mg by mouth daily.   DULoxetine (CYMBALTA) 60 MG capsule Take 60 mg by mouth daily.   empagliflozin (JARDIANCE) 25 MG TABS tablet Take 1 tablet (25 mg total) by mouth daily before breakfast.   ferrous sulfate 325 (65 FE) MG tablet Take 1 tablet (325 mg total) by mouth 2 (two) times daily with a meal.   fluticasone (FLONASE) 50 MCG/ACT nasal spray Place 2 sprays into both nostrils daily.   furosemide (LASIX) 80 MG tablet TAKE ONE TABLET BY MOUTH TWICE DAILY FOR FLUID   minoxidil (LONITEN) 10 MG tablet TAKE ONE TABLET BY MOUTH ONCE DAILY FOR BLOOD PRESSURE   montelukast (SINGULAIR) 10 MG tablet Take 1 tablet (10 mg total) by mouth at bedtime.   Multiple Vitamin (MULTIVITAMIN WITH MINERALS) TABS tablet Take 1 tablet by mouth daily.   olmesartan (BENICAR) 20 MG tablet Take 1 tablet (20 mg total) by mouth daily.   omeprazole (PRILOSEC) 20 MG capsule Take 20 mg by mouth daily as needed (heartburn).    polyethylene  glycol powder (GLYCOLAX/MIRALAX) powder Take 17 g by mouth daily.    Semaglutide,0.25 or 0.'5MG'$ /DOS, (OZEMPIC, 0.25 OR 0.5 MG/DOSE,) 2 MG/1.5ML SOPN Inject 0.5 mg into the skin once a week.   spironolactone (ALDACTONE) 25 MG tablet Take 1 tablet (25 mg total) by mouth daily.   [DISCONTINUED] OZEMPIC, 0.25 OR 0.5 MG/DOSE, 2 MG/3ML SOPN Inject into the skin.   [DISCONTINUED] Semaglutide,0.25 or 0.'5MG'$ /DOS, (OZEMPIC, 0.25 OR 0.5 MG/DOSE,) 2 MG/1.5ML SOPN Inject 0.5 mg into the skin  once a week.   No facility-administered encounter medications on file as of 12/02/2021.    Patient Active Problem List   Diagnosis Date Noted   Left ventricular hypertrophy 06/21/2021   Primary hyperparathyroidism ([place]) 03/14/2021   Post-COVID syndrome 12/03/2020   Recurrent boils 12/03/2020   Morbid obesity ([place]) 12/03/2020   CKD stage 4 due to type 2 diabetes mellitus ([place]) 12/01/2020   Aortic atherosclerosis ([place]) 12/01/2020   Hypertension associated with diabetes ([place]) 07/15/2020   Paroxysmal atrial fibrillation ([place]) 07/15/2020   Chronic pain syndrome 07/15/2020   Depression, major, single episode, moderate ([place]) 07/15/2020   Type 2 diabetes mellitus with morbid obesity ([place]) 07/15/2020   GERD (gastroesophageal reflux disease) 07/15/2020   Hyperlipidemia associated with type 2 diabetes mellitus ([place]) 07/15/2020   OSA (obstructive sleep apnea) 07/15/2020   Other thrombophilia ([place]) 03/12/2020   History of lumbar spinal fusion 04/26/2018   Hypertensive retinopathy of both eyes 06/08/2017   DDD (degenerative disc disease), lumbar 10/31/2014   Cervical spondylosis with myelopathy 02/22/2014   Anemia in chronic kidney disease (CKD) 02/22/2014   Mitral regurgitation 09/08/2013    Conditions to be addressed/monitored: HTN, DMII, Depression, and Chronic Pain Syndrome  Care Plan : General Social Work (Adult)  Updates made by Rebekah [place]field, LCSW since 12/02/2021 12:00 AM     Problem: Coping Skills (General Plan of Care)      Goal: Coping Skills Enhanced Completed 12/02/2021  Start Date: 01/16/2021  Expected End Date: 01/28/2022  This Visit's Progress: On track  Recent Progress: On track  Priority: High  Note:   Current barriers:   Acute Mental Health needs related to depression Mental Health Concerns  Needs Support, Education, and Care Coordination in order to meet unmet mental health needs. Clinical Goal(s): Over the next 120 days, patient will work with SW, counselor and  therapist to reduce or manage symptoms of agitation, mood instability, stress, and bipolar until connected for ongoing counseling. Clinical Interventions:  Assessed patient's previous and current treatment, coping skills, support system and barriers to care  Patient reports relief that her medications were covered, after being approved for patient assistance Depression symptoms and managing chronic health conditions well  Patient continues compliance with medications  Patient continues to utilize healthy coping skills to assist with management of symptoms  Patient has a strong support system CCM LCSW reviewed upcoming appointments with patient Mindfulness or Relaxation Training, Active listening / Reflection utilized , Emotional Supportive Provided, Provided psychoeducation for mental health needs , Reviewed mental health medications with patient and discussed compliance: , Verbalization of feelings encouraged , and Suicidal Ideation/Homicidal Ideation assessed: Patient denies SI/HI Patient Goals/Self-Care Activities: Over the next 120 days Contact clinic with any questions or concerns Utilize healthy coping skills discussed and continue compliance with meds Attend scheduled appointments with providers       Follow Up Plan: No follow up required. Pt completed care plan goals      Christa See, MSW, [facility]  [email or web address] Phone [phone] 1:38 PM

## 2021-12-02 NOTE — Telephone Encounter (Signed)
Requested medication (s) are due for refill today: historical medication  Requested medication (s) are on the active medication list: yes   Last refill:   na   Future visit scheduled: yes in 2 weeks   Notes to clinic:  historical medication . Do you want to order Rx?     Requested Prescriptions  Pending Prescriptions Disp Refills   polyethylene glycol powder (GLYCOLAX/MIRALAX) 17 GM/SCOOP powder [Pharmacy Med Name: POLYETH GLYCOL 3350 NF POWDER 510GM] 1,530 g     Sig: MIX 17 GRAMS(1 CAPFUL) OF POWDER IN 4 TO 8 OUNCES OF FLUID AND TAKE BY MOUTH ONCE DAILY AS NEEDED FOR CONSTIPATION     Gastroenterology:  [place] Passed - 12/02/2021  9:55 AM      Passed - Valid encounter within last 12 months    Recent Outpatient Visits           2 months ago Type 2 diabetes mellitus with morbid obesity ([place])   [place], Jolene T, NP   4 months ago Type 2 diabetes mellitus with morbid obesity ([place])   [place], Jolene T, NP   5 months ago Type 2 diabetes mellitus with morbid obesity ([place])   [place], Jolene T, NP   8 months ago Type 2 diabetes mellitus with morbid obesity ([place])   [place] Cannady, Jolene T, NP   11 months ago Type 2 diabetes mellitus with morbid obesity ([place])   [place], Barbaraann Faster, NP       Future Appointments             In 2 weeks Cannady, Barbaraann Faster, NP [employer]   In 1 month  [facility]

## 2021-12-03 DIAGNOSIS — E1122 Type 2 diabetes mellitus with diabetic chronic kidney disease: Secondary | ICD-10-CM | POA: Diagnosis not present

## 2021-12-03 DIAGNOSIS — N184 Chronic kidney disease, stage 4 (severe): Secondary | ICD-10-CM | POA: Diagnosis not present

## 2021-12-09 ENCOUNTER — Other Ambulatory Visit: Payer: Self-pay | Admitting: Physician Assistant

## 2021-12-09 DIAGNOSIS — R0609 Other forms of dyspnea: Secondary | ICD-10-CM

## 2021-12-10 DIAGNOSIS — N184 Chronic kidney disease, stage 4 (severe): Secondary | ICD-10-CM | POA: Diagnosis not present

## 2021-12-10 DIAGNOSIS — N2581 Secondary hyperparathyroidism of renal origin: Secondary | ICD-10-CM | POA: Diagnosis not present

## 2021-12-10 DIAGNOSIS — D631 Anemia in chronic kidney disease: Secondary | ICD-10-CM | POA: Diagnosis not present

## 2021-12-10 DIAGNOSIS — E1122 Type 2 diabetes mellitus with diabetic chronic kidney disease: Secondary | ICD-10-CM | POA: Diagnosis not present

## 2021-12-10 DIAGNOSIS — I10 Essential (primary) hypertension: Secondary | ICD-10-CM | POA: Diagnosis not present

## 2021-12-19 NOTE — Patient Instructions (Signed)

## 2021-12-21 ENCOUNTER — Ambulatory Visit (INDEPENDENT_AMBULATORY_CARE_PROVIDER_SITE_OTHER): Payer: Medicare HMO | Admitting: Nurse Practitioner

## 2021-12-21 ENCOUNTER — Encounter: Payer: Self-pay | Admitting: Nurse Practitioner

## 2021-12-21 DIAGNOSIS — F321 Major depressive disorder, single episode, moderate: Secondary | ICD-10-CM

## 2021-12-21 DIAGNOSIS — E1159 Type 2 diabetes mellitus with other circulatory complications: Secondary | ICD-10-CM | POA: Diagnosis not present

## 2021-12-21 DIAGNOSIS — E1122 Type 2 diabetes mellitus with diabetic chronic kidney disease: Secondary | ICD-10-CM

## 2021-12-21 DIAGNOSIS — K219 Gastro-esophageal reflux disease without esophagitis: Secondary | ICD-10-CM | POA: Diagnosis not present

## 2021-12-21 DIAGNOSIS — E785 Hyperlipidemia, unspecified: Secondary | ICD-10-CM | POA: Diagnosis not present

## 2021-12-21 DIAGNOSIS — N184 Chronic kidney disease, stage 4 (severe): Secondary | ICD-10-CM | POA: Diagnosis not present

## 2021-12-21 DIAGNOSIS — Z23 Encounter for immunization: Secondary | ICD-10-CM | POA: Diagnosis not present

## 2021-12-21 DIAGNOSIS — E1169 Type 2 diabetes mellitus with other specified complication: Secondary | ICD-10-CM

## 2021-12-21 DIAGNOSIS — I480 Paroxysmal atrial fibrillation: Secondary | ICD-10-CM

## 2021-12-21 DIAGNOSIS — E210 Primary hyperparathyroidism: Secondary | ICD-10-CM | POA: Diagnosis not present

## 2021-12-21 DIAGNOSIS — I152 Hypertension secondary to endocrine disorders: Secondary | ICD-10-CM

## 2021-12-21 DIAGNOSIS — I340 Nonrheumatic mitral (valve) insufficiency: Secondary | ICD-10-CM

## 2021-12-21 DIAGNOSIS — G4733 Obstructive sleep apnea (adult) (pediatric): Secondary | ICD-10-CM

## 2021-12-21 DIAGNOSIS — I700 Atherosclerosis of aorta: Secondary | ICD-10-CM

## 2021-12-21 DIAGNOSIS — D631 Anemia in chronic kidney disease: Secondary | ICD-10-CM

## 2021-12-21 DIAGNOSIS — L0293 Carbuncle, unspecified: Secondary | ICD-10-CM

## 2021-12-21 DIAGNOSIS — D6869 Other thrombophilia: Secondary | ICD-10-CM | POA: Diagnosis not present

## 2021-12-21 DIAGNOSIS — N1832 Chronic kidney disease, stage 3b: Secondary | ICD-10-CM | POA: Diagnosis not present

## 2021-12-21 DIAGNOSIS — H35033 Hypertensive retinopathy, bilateral: Secondary | ICD-10-CM

## 2021-12-21 LAB — BAYER DCA HB A1C WAIVED: HB A1C (BAYER DCA - WAIVED): 6.3 % — ABNORMAL HIGH (ref 4.8–5.6)

## 2021-12-21 MED ORDER — MUPIROCIN 2 % EX OINT: 1.0000 | TOPICAL_OINTMENT | Freq: Two times a day (BID) | CUTANEOUS | 0 refills | Status: DC

## 2021-12-21 MED ORDER — DOXYCYCLINE HYCLATE 100 MG PO TABS: 100.0000 mg | ORAL_TABLET | Freq: Two times a day (BID) | ORAL | 0 refills | Status: AC

## 2021-12-21 NOTE — Assessment & Plan Note (Signed)
Currently with one to groin area, will treat with Doxycycline and Mupirocin.  No drainage at this time.  Recommend warm compresses and Tylenol as needed for pain.  Return to office for worsening or ongoing, may need dermatology or general surgery referral.

## 2021-12-21 NOTE — Assessment & Plan Note (Signed)
Chronic, ongoing.  Attempt to get records.

## 2021-12-21 NOTE — Assessment & Plan Note (Signed)
Related to a-fib and long term Eliquis.  Monitor CBC regularly and monitor skin for bruising or breakdown, notify provider if present. 

## 2021-12-21 NOTE — Assessment & Plan Note (Signed)
Mild mitral and tricuspid regurgitation noted on echo June 2022.  Continue collaboration with cardiology and current medication regimen. 

## 2021-12-21 NOTE — Assessment & Plan Note (Signed)
Chronic, ongoing.  Continue Omeprazole as needed and will check Mag level today.

## 2021-12-21 NOTE — Assessment & Plan Note (Signed)
Chronic, stable.   Denies SI/HI.  Continue Duloxetine at this time, which also offers benefit to chronic pain and mood, + continue Buspar which has offered her benefit to anxiety.  Refills as needed.  Will adjust dosing or add medication as needed.  Return in 3 months. 

## 2021-12-21 NOTE — Assessment & Plan Note (Signed)
Chronic, ongoing.  Continue collaboration with nephrology and current medication regimen. Recent labs performed by them and stable. 

## 2021-12-21 NOTE — Assessment & Plan Note (Signed)
Chronic, ongoing, followed by cardiology.  Continue Eliquis and Metoprolol + collaboration with cardiology team.  Rate controlled.  Continue to collaborate with CCM team.

## 2021-12-21 NOTE — Assessment & Plan Note (Signed)
Chronic, ongoing with A1c 6.3% today, downward trend and urine PRT/CRT ratio 16 August 2021.  Praised for ongoing success. - Continue Jardiance 25 MG and Ozempic 0.5 MG weekly at this time, as seeing significant benefit with this.  Will reach out to CCM on refills with assistance. - Maintain off Glimepiride due to her age and risk for hypoglycemia. Plus maintain off Metformin due to kidney health. - If poor control once at MAX doses of GLP1 and SGLT2, then will consider referral to endo, but at this time will continue to work on this in office which patient agrees with. - Check BS TID and focus on diabetic diet.    - Return in 3 months for A1c -- but alert provider prior to if elevations in BS >300 or <70.

## 2021-12-21 NOTE — Progress Notes (Signed)
BP 120/64   Pulse 60   Temp 98.3 F (36.8 C) (Oral)   Ht '5\' 1"'$  (1.549 m)   Wt 218 lb 3.2 oz (99 kg)   SpO2 97%   BMI 41.23 kg/m    Subjective:    Patient ID: Cindy [name], female    DOB: 29-May-1951, 71 y.o.   MRN: [id number]  HPI: Cindy [name] is a 71 y.o. female  Chief Complaint  Patient presents with   Diabetes    Patient recent Diabetic Eye Exam was requested at today's visit.    Hyperlipidemia   Hypertension   Atrial Fibrillation   Chronic Kidney Disease   Mood   Recurrent Skin Infections    Patient says she has a boil down in her groin area. Patient says she notices it is draining, but she notices it will fill back up. Patient says she first noticed it about a month ago, she thought with the drainage it would go away, but is requesting a prescription to help the area.    DIABETES A1c 6.9% last check.  Taking Jardiance and Ozempic.  Continues with CCM for cost assist with Jardiance and Eliquis.   Hypoglycemic episodes:no Polydipsia/polyuria: no Visual disturbance: no Chest pain: no Paresthesias: no Glucose Monitoring: yes             Accucheck frequency: Daily             Fasting glucose: 121 to 154 -- average 130 range             Post prandial:             Evening:             Before meals: Taking Insulin?: no             Long acting insulin:             Short acting insulin: Blood Pressure Monitoring: a few times a week Retinal Examination: Up to Date -- [facility], this year Foot Exam: Up to Date  Pneumovax: Up To Date Influenza: Not up to Date - refuses Aspirin: no    HYPERTENSION / HYPERLIPIDEMIA/A-FIB Followed by cardiology, last 11/18/21 -- is going to pulmonary this week, due to ongoing SOBE.  Continues on Eliquis, Atorvastatin, Diltiazem, Lasix, Olmesartan, Bisoprolol, Spironolactone, and Minoxidil. Uses CPAP every night.   Last echo 11/26/20 with EF 60%.  Aortic atherosclerosis noted on 07/15/20 imaging.   Satisfied with current  treatment? yes Duration of hypertension: chronic BP monitoring frequency: a few times a week BP range: 120/60-70 BP medication side effects: no Duration of hyperlipidemia: chronic Cholesterol medication side effects: no Cholesterol supplements: none Medication compliance: good compliance Aspirin: no Recent stressors: no Recurrent headaches: no Visual changes: no Palpitations: no Dyspnea: ongoing with activity Chest pain: no Lower extremity edema: no Dizzy/lightheaded: occasional if changes positions too fast  CHRONIC KIDNEY DISEASE Labs on 12/18/21 with nephrology = CRT 1.78 GFR 30, PTH 62 H/H 11.5/35.6. CKD status: controlled Medications renally dose:  discussed with patient Previous renal evaluation: no Pneumovax:  Up To Date Influenza Vaccine:  Not up to Date --refuses  SKIN INFECTION History of recurrent boils.  Currently has one to groin area, present for one month.  Will drain and then fill back up. Duration: weeks Location: groin left History of trauma in area: no Pain: yes Quality: yes Severity: 7/10 Redness: no Swelling: yes Oozing:  sometimes it drains and then returns Pus: no Fevers: no Nausea/vomiting:  no Status: fluctuating Treatments attempted:warm compresses  Tetanus:  provided today    DEPRESSION Continues on Duloxetine and Buspar daily.  Does endorse having anxiety with acute events. Mood status: stable Satisfied with current treatment?: yes Symptom severity: moderate  Duration of current treatment : chronic Side effects: no Medication compliance: good compliance Psychotherapy/counseling: none Depressed mood: yes Anxious mood: no Anhedonia: no Significant weight loss or gain: no Insomnia: none Fatigue: no Feelings of worthlessness or guilt: no Impaired concentration/indecisiveness: no Suicidal ideations: no Hopelessness: no Crying spells: no    12/21/2021   10:21 AM 09/21/2021   10:18 AM 06/23/2021   11:34 AM 03/23/2021   11:20 AM  01/19/2021    2:07 PM  Depression screen PHQ 2/9  Decreased Interest '1 2 3 2 '$ 0  Down, Depressed, Hopeless '2 2 2 2 '$ 0  PHQ - 2 Score '3 4 5 4 '$ 0  Altered sleeping 2 0 0 2   Tired, decreased energy '1 2 3 2   '$ Change in appetite 0 '1 1 2   '$ Feeling bad or failure about yourself  0 0 3 2   Trouble concentrating '1 2 3 2   '$ Moving slowly or fidgety/restless 0 1 2 0   Suicidal thoughts 0 0 0 0   PHQ-9 Score '7 10 17 14   '$ Difficult doing work/chores Not difficult at all  Somewhat difficult Somewhat difficult        12/21/2021   10:21 AM 09/21/2021   10:18 AM 06/23/2021   11:34 AM 03/23/2021   11:24 AM  GAD 7 : Generalized Anxiety Score  Nervous, Anxious, on Edge '1 2 3 2  '$ Control/stop worrying '1 2 3 2  '$ Worry too much - different things '1 2 3 2  '$ Trouble relaxing 0 '1 3 2  '$ Restless 0 '1 3 2  '$ Easily annoyed or irritable '1 1 3 2  '$ Afraid - awful might happen 0 '1 3 2  '$ Total GAD 7 Score '4 10 21 14  '$ Anxiety Difficulty Not difficult at all Somewhat difficult  Very difficult    Relevant past medical, surgical, family and social history reviewed and updated as indicated. Interim medical history since our last visit reviewed. Allergies and medications reviewed and updated.  Review of Systems  Constitutional:  Negative for activity change, appetite change, diaphoresis, fatigue and fever.  Respiratory:  Positive for shortness of breath. Negative for cough, chest tightness and wheezing.   Cardiovascular:  Negative for chest pain, palpitations and leg swelling.  Gastrointestinal: Negative.   Endocrine: Negative for cold intolerance, heat intolerance, polydipsia, polyphagia and polyuria.  Skin:  Positive for wound.  Neurological: Negative.   Psychiatric/Behavioral: Negative.      Per HPI unless specifically indicated above     Objective:    BP 120/64   Pulse 60   Temp 98.3 F (36.8 C) (Oral)   Ht '5\' 1"'$  (1.549 m)   Wt 218 lb 3.2 oz (99 kg)   SpO2 97%   BMI 41.23 kg/m   Wt Readings from Last 3  Encounters:  12/21/21 218 lb 3.2 oz (99 kg)  09/21/21 220 lb 9.6 oz (100.1 kg)  07/23/21 225 lb (102.1 kg)    Physical Exam Vitals and nursing note reviewed. Exam conducted with a chaperone present.  Constitutional:      General: She is awake. She is not in acute distress.    Appearance: She is well-developed and well-groomed. She is morbidly obese. She is not ill-appearing or toxic-appearing.  HENT:  Head: Normocephalic.     Right Ear: Hearing normal.     Left Ear: Hearing normal.  Eyes:     General: Lids are normal.        Right eye: No discharge.        Left eye: No discharge.     Conjunctiva/sclera: Conjunctivae normal.     Pupils: Pupils are equal, round, and reactive to light.  Neck:     Thyroid: No thyromegaly.     Vascular: No carotid bruit.  Cardiovascular:     Rate and Rhythm: Normal rate and regular rhythm.     Heart sounds: Murmur heard.     Systolic murmur is present with a grade of 2/6.     No gallop.  Pulmonary:     Effort: Pulmonary effort is normal. No accessory muscle usage or respiratory distress.     Breath sounds: No decreased breath sounds or wheezing.  Abdominal:     General: Bowel sounds are normal. There is no distension.     Palpations: Abdomen is soft.  Genitourinary:    Exam position: Lithotomy position.     Labia:        Right: No rash.        Left: No rash.     Musculoskeletal:     Cervical back: Normal range of motion and neck supple.     Right lower leg: No edema.     Left lower leg: No edema.  Lymphadenopathy:     Cervical: No cervical adenopathy.  Skin:    General: Skin is warm and dry.  Neurological:     Mental Status: She is alert and oriented to person, place, and time.     Deep Tendon Reflexes: Reflexes are normal and symmetric.     Reflex Scores:      Brachioradialis reflexes are 2+ on the right side and 2+ on the left side.      Patellar reflexes are 2+ on the right side and 2+ on the left side. Psychiatric:         Attention and Perception: Attention normal.        Mood and Affect: Mood normal.        Speech: Speech normal.        Behavior: Behavior normal. Behavior is cooperative.        Thought Content: Thought content normal.    Diabetic Foot Exam - Simple   Simple Foot Form Visual Inspection No deformities, no ulcerations, no other skin breakdown bilaterally: Yes Sensation Testing Intact to touch and monofilament testing bilaterally: Yes Pulse Check Posterior Tibialis and Dorsalis pulse intact bilaterally: Yes Comments     Results for orders placed or performed in visit on 12/21/21  Bayer DCA Hb A1c Waived  Result Value Ref Range   HB A1C (BAYER DCA - WAIVED) 6.3 (H) 4.8 - 5.6 %      Assessment & Plan:   Problem List Items Addressed This Visit       Cardiovascular and Mediastinum   Aortic atherosclerosis ([place])    Ongoing.  Noted on 07/15/20 imaging.  At this time continue statin and Eliquis daily for prevention.        Relevant Medications   olmesartan (BENICAR) 40 MG tablet   Other Relevant Orders   Lipid Panel w/o Chol/HDL Ratio   Hypertension associated with diabetes (HCC)    Chronic, stable with BP at goal today.  At this time will continue current regimen and adjust as needed based  on BP and HR.  Continue collaboration with cardiology.  Recommend she monitor BP at least a few mornings a week at home and document.  DASH diet at home.  Labs: TSH.  Return in 3 months.       Relevant Medications   olmesartan (BENICAR) 40 MG tablet   Other Relevant Orders   Bayer DCA Hb A1c Waived (Completed)   TSH   Mitral regurgitation    Mild mitral and tricuspid regurgitation noted on echo June 2022.  Continue collaboration with cardiology and current medication regimen.      Relevant Medications   olmesartan (BENICAR) 40 MG tablet   Paroxysmal atrial fibrillation (HCC)    Chronic, ongoing, followed by cardiology.  Continue Eliquis and Metoprolol + collaboration with cardiology  team.  Rate controlled.  Continue to collaborate with CCM team.      Relevant Medications   olmesartan (BENICAR) 40 MG tablet     Respiratory   OSA (obstructive sleep apnea)    Chronic, ongoing.  Continue 100% adherence to CPAP use at night.        Digestive   GERD (gastroesophageal reflux disease)    Chronic, ongoing.  Continue Omeprazole as needed and will check Mag level today.      Relevant Orders   Magnesium     Endocrine   CKD stage 4 due to type 2 diabetes mellitus (HCC)    Chronic, ongoing with recent labs stable.  At this time reduce and renal dose medications as needed + continue collaboration with nephrology.  Labs up to date with nephrology.  Avoid NSAIDs.      Relevant Medications   olmesartan (BENICAR) 40 MG tablet   Other Relevant Orders   Bayer DCA Hb A1c Waived (Completed)   Hyperlipidemia associated with type 2 diabetes mellitus (HCC)    Chronic, ongoing.  Continue current medication regimen and adjust as needed.  Lipid panel in office today.       Relevant Medications   olmesartan (BENICAR) 40 MG tablet   Other Relevant Orders   Bayer DCA Hb A1c Waived (Completed)   Lipid Panel w/o Chol/HDL Ratio   Primary hyperparathyroidism (HCC)    Chronic, ongoing.  Continue collaboration with nephrology and current medication regimen. Recent labs performed by them and stable.      Type 2 diabetes mellitus with morbid obesity ([place]) - Primary    Chronic, ongoing with A1c 6.3% today, downward trend and urine PRT/CRT ratio 16 August 2021.  Praised for ongoing success. - Continue Jardiance 25 MG and Ozempic 0.5 MG weekly at this time, as seeing significant benefit with this.  Will reach out to CCM on refills with assistance. - Maintain off Glimepiride due to her age and risk for hypoglycemia. Plus maintain off Metformin due to kidney health. - If poor control once at MAX doses of GLP1 and SGLT2, then will consider referral to endo, but at this time will continue to  work on this in office which patient agrees with. - Check BS TID and focus on diabetic diet.    - Return in 3 months for A1c -- but alert provider prior to if elevations in BS >300 or <70.        Relevant Medications   olmesartan (BENICAR) 40 MG tablet   Other Relevant Orders   Bayer DCA Hb A1c Waived (Completed)     Genitourinary   Anemia in chronic kidney disease (CKD)    Ongoing.  Labs up to date with nephrology  and stable.  Continue this collaboration, recent notes and labs reviewed.        Hematopoietic and Hemostatic   Other thrombophilia ([place])    Related to a-fib and long term Eliquis.  Monitor CBC regularly and monitor skin for bruising or breakdown, notify provider if present.        Other   Depression, major, single episode, moderate (HCC)    Chronic, stable  Denies SI/HI.  Continue Duloxetine at this time, which also offers benefit to chronic pain and mood, + continue Buspar which has offered her benefit to anxiety.  Refills as needed.  Will adjust dosing or add medication as needed.  Return in 3 months.      Hypertensive retinopathy of both eyes    Chronic, ongoing.  Attempt to get records.      Morbid obesity (HCC)    BMI 41.23 with T2DM, HTN, CKD with some more loss noted, praised for this.  Recommended eating smaller high protein, low fat meals more frequently and exercising 30 mins a day 5 times a week with a goal of 10-15lb weight loss in the next 3 months. Patient voiced their understanding and motivation to adhere to these recommendations.       Recurrent boils    Currently with one to groin area, will treat with Doxycycline and Mupirocin.  No drainage at this time.  Recommend warm compresses and Tylenol as needed for pain.  Return to office for worsening or ongoing, may need dermatology or general surgery referral.      Relevant Medications   mupirocin ointment (BACTROBAN) 2 %   Other Visit Diagnoses     Need for Td vaccine       Td in office today.    Relevant Orders   Td vaccine greater than or equal to 7yo preservative free IM        Follow up plan: Return in about 3 months (around 03/23/2022) for T2DM, HTN/HLD, CKD, MOOD.

## 2021-12-21 NOTE — Assessment & Plan Note (Signed)
Chronic, ongoing with recent labs stable.  At this time reduce and renal dose medications as needed + continue collaboration with nephrology.  Labs up to date with nephrology.  Avoid NSAIDs.

## 2021-12-21 NOTE — Assessment & Plan Note (Signed)
Ongoing.  Noted on 07/15/20 imaging.  At this time continue statin and Eliquis daily for prevention.   

## 2021-12-21 NOTE — Assessment & Plan Note (Signed)
Ongoing.  Labs up to date with nephrology and stable.  Continue this collaboration, recent notes and labs reviewed. 

## 2021-12-21 NOTE — Assessment & Plan Note (Signed)
Chronic, ongoing.  Continue 100% adherence to CPAP use at night.

## 2021-12-21 NOTE — Assessment & Plan Note (Signed)
Chronic, ongoing.  Continue current medication regimen and adjust as needed.  Lipid panel in office today.  

## 2021-12-21 NOTE — Assessment & Plan Note (Signed)
Chronic, stable with BP at goal today.  At this time will continue current regimen and adjust as needed based on BP and HR.  Continue collaboration with cardiology.  Recommend she monitor BP at least a few mornings a week at home and document.  DASH diet at home.  Labs: TSH.  Return in 3 months.

## 2021-12-21 NOTE — Assessment & Plan Note (Signed)
BMI 41.23 with T2DM, HTN, CKD with some more loss noted, praised for this.  Recommended eating smaller high protein, low fat meals more frequently and exercising 30 mins a day 5 times a week with a goal of 10-15lb weight loss in the next 3 months. Patient voiced their understanding and motivation to adhere to these recommendations.

## 2021-12-22 ENCOUNTER — Telehealth: Payer: Self-pay

## 2021-12-22 LAB — LIPID PANEL W/O CHOL/HDL RATIO
Cholesterol, Total: 135 mg/dL (ref 100–199)
HDL: 46 mg/dL (ref >39)
LDL Chol Calc (NIH): 73 mg/dL (ref 0–99)
Triglycerides: 84 mg/dL (ref 0–149)
VLDL Cholesterol Cal: 16 mg/dL (ref 5–40)

## 2021-12-22 LAB — TSH: TSH: 2.39 u[IU]/mL (ref 0.450–4.500)

## 2021-12-22 LAB — MAGNESIUM: Magnesium: 2.4 mg/dL — ABNORMAL HIGH (ref 1.6–2.3)

## 2021-12-22 NOTE — Telephone Encounter (Signed)
Called and let patient know numbers for PAP refills

## 2021-12-22 NOTE — Telephone Encounter (Signed)
5 boxes of Ozempic received for the patient. Called and LVM letting patient know that it was ready to be picked up.

## 2021-12-22 NOTE — Telephone Encounter (Signed)
Patient returned call and stated she will pick up Ozempic tomorrow.

## 2021-12-22 NOTE — Progress Notes (Signed)
Good afternoon, please let Cindy [name] know her labs have returned.  Magnesium is a little high still, if she is taking any magnesium supplements at home please stop these and we will recheck at future visit.  Cholesterol levels are stable, continue Atorvastatin for this.Thyroid level is normal.  No medication changes needed at this time.  Any questions? Keep being stellar!!  Thank you for allowing me to participate in your care.  I appreciate you. Kindest regards, Kerrington Sova

## 2021-12-23 DIAGNOSIS — R143 Flatulence: Secondary | ICD-10-CM | POA: Diagnosis not present

## 2021-12-23 DIAGNOSIS — K5909 Other constipation: Secondary | ICD-10-CM | POA: Diagnosis not present

## 2021-12-23 DIAGNOSIS — Z86010 Personal history of colonic polyps: Secondary | ICD-10-CM | POA: Diagnosis not present

## 2021-12-23 DIAGNOSIS — R140 Abdominal distension (gaseous): Secondary | ICD-10-CM | POA: Diagnosis not present

## 2021-12-23 NOTE — Telephone Encounter (Signed)
Medication picked up for the patient.

## 2021-12-24 ENCOUNTER — Ambulatory Visit: Payer: Medicare HMO

## 2021-12-24 ENCOUNTER — Ambulatory Visit: Payer: Medicare HMO | Attending: Physician Assistant

## 2021-12-24 DIAGNOSIS — R0609 Other forms of dyspnea: Secondary | ICD-10-CM

## 2021-12-24 DIAGNOSIS — R0600 Dyspnea, unspecified: Secondary | ICD-10-CM | POA: Insufficient documentation

## 2021-12-24 LAB — PULMONARY FUNCTION TEST ARMC ONLY
DL/VA % pred: 108 %
DL/VA: 4.57 ml/min/mmHg/L
DLCO unc % pred: 74 %
DLCO unc: 13.03 ml/min/mmHg
FEF 25-75 Post: 1.66 L/sec
FEF 25-75 Pre: 1.99 L/sec
FEF2575-%Change-Post: -16 %
FEF2575-%Pred-Post: 98 %
FEF2575-%Pred-Pre: 117 %
FEV1-%Change-Post: 1 %
FEV1-%Pred-Post: 72 %
FEV1-%Pred-Pre: 70 %
FEV1-Post: 1.41 L
FEV1-Pre: 1.39 L
FEV1FVC-%Change-Post: 8 %
FEV1FVC-%Pred-Pre: 112 %
FEV6-%Change-Post: -6 %
FEV6-%Pred-Post: 61 %
FEV6-%Pred-Pre: 65 %
FEV6-Post: 1.53 L
FEV6-Pre: 1.63 L
FEV6FVC-%Pred-Post: 104 %
FEV6FVC-%Pred-Pre: 104 %
FVC-%Change-Post: -6 %
FVC-%Pred-Post: 58 %
FVC-%Pred-Pre: 62 %
FVC-Post: 1.53 L
FVC-Pre: 1.63 L
Post FEV1/FVC ratio: 92 %
Post FEV6/FVC ratio: 100 %
Pre FEV1/FVC ratio: 85 %
Pre FEV6/FVC Ratio: 100 %
RV % pred: 97 %
RV: 2.01 L
TLC % pred: 81 %
TLC: 3.78 L

## 2021-12-24 MED ORDER — ALBUTEROL SULFATE (2.5 MG/3ML) 0.083% IN NEBU
2.5000 mg | INHALATION_SOLUTION | Freq: Once | RESPIRATORY_TRACT | Status: AC
  Administered 2021-12-24: 2.5 mg via RESPIRATORY_TRACT

## 2021-12-28 DIAGNOSIS — E1159 Type 2 diabetes mellitus with other circulatory complications: Secondary | ICD-10-CM | POA: Diagnosis not present

## 2021-12-28 DIAGNOSIS — I152 Hypertension secondary to endocrine disorders: Secondary | ICD-10-CM

## 2021-12-28 DIAGNOSIS — E1169 Type 2 diabetes mellitus with other specified complication: Secondary | ICD-10-CM | POA: Diagnosis not present

## 2021-12-28 DIAGNOSIS — F321 Major depressive disorder, single episode, moderate: Secondary | ICD-10-CM | POA: Diagnosis not present

## 2022-01-05 ENCOUNTER — Telehealth: Payer: Medicare HMO

## 2022-01-05 ENCOUNTER — Ambulatory Visit: Payer: Self-pay

## 2022-01-05 NOTE — Patient Instructions (Signed)
Visit Information  Thank you for taking time to visit with me today. Please don't hesitate to contact me if I can be of assistance to you.   Following are the goals we discussed today:   Goals Addressed             This Visit's Progress    Effective management of GI sx and sx       Care Coordination Interventions: Evaluation of current treatment plan related to GI sx and sx and elevated Magnesium level and patient's adherence to plan as established by provider Advised patient to call the office for changes in her GI health and well being, constipation, or questions and concerns related to what can cause high magnesium Provided education to patient re: foods that are high in magnesium  Reviewed medications with patient and discussed compliance. The patient is taking Dance movement psychotherapist for women. On the label it states it has 100 mg of Magnesium in it. Education provided. Will also collaborate with pcp Collaborated with pcp regarding the patients question about Centrum Silver for Women and if she should stop taking and what could be an alternate substitute for the [employer] Provided patient with foods high in Magnesium, also how to prevent constipation educational materials related to elevated Magnesium and GI sx and sx of constipation and gas at times. She saw the GI specialist and she was told to take laxative BID if needed to prevent constipation, not to use straws, and other ways of preventing gas. The GI specialist does not want to do a colonoscopy because of risk the patient has if being put to sleep.  Reviewed scheduled/upcoming provider appointments including 01-22-2022 at 145 for AWV and 03-23-2022 at 1020 am with pcp Discussed plans with patient for ongoing care management follow up and provided patient with direct contact information for care management team Advised patient to discuss changes in her GI sx and sx, or concerns about elevated Magnesium  with provider        RNCM:  Effective Management of Pain       Care Coordination Interventions: The patient rates her left knee pain at a 5 today. Sometimes it hurts when she walks because of a boil she has in  her inner thigh, she says that it is much better and not painful now.  Reviewed provider established plan for pain management. 01-05-2022: The patient states she does not want to have surgery. She states that she is careful when walking and has not had falls. Education and support given.  Discussed importance of adherence to all scheduled medical appointments. The patient has an appointment for AWV on 01-22-2022 and 03-23-2022 with pcp.  Counseled on the importance of reporting any/all new or changed pain symptoms or management strategies to pain management provider Advised patient to report to care team affect of pain on daily activities. 01-05-2022: Review and education done  Discussed use of relaxation techniques and/or diversional activities to assist with pain reduction (distraction, imagery, relaxation, massage, acupressure, TENS, heat, and cold application Reviewed with patient prescribed pharmacological and nonpharmacological pain relief strategies Advised patient to discuss unresolved pain, changes in level or intensity of pain  with provider Screening for signs and symptoms of depression related to chronic disease state  Assessed social determinant of health barriers AWV: The patient is scheduled to have her AWV on 01-22-2022. Reminder given today 01-05-2022           Our next appointment is by telephone on 03-09-2022 at 130 pm  Please call the care guide team at [phone] if you need to cancel or reschedule your appointment.   If you are experiencing a Mental Health or [place] or need someone to talk to, please call the Suicide and Crisis Lifeline: 988 call the [place] National Suicide Prevention Lifeline: 850-222-7063 or TTY: (405) 336-1127 TTY (479) 317-7528) to talk to a trained  counselor call 1-800-273-TALK (toll free, 24 hour hotline)  The patient verbalized understanding of instructions, educational materials, and care plan provided today and DECLINED offer to receive copy of patient instructions, educational materials, and care plan.   Telephone follow up appointment with care management team member scheduled for: 03-09-2022 at 130 pm  [place], MSN, [facility] Mobile: [phone]

## 2022-01-05 NOTE — Patient Outreach (Signed)
Care Coordination   Follow Up Visit Note   01/05/2022 Name: Cindy [name] MRN: [id number] DOB: 06-16-1950  Cindy [name] is a 71 y.o. year old female who sees [place], Henrine Screws T, NP for primary care. I spoke with  [place] by phone today  What matters to the patients health and wellness today?  Pain in her knee and boil she has that has not gone away. Concerns about her Magnesium being elevated and GI sx and sx she is having.     Goals Addressed             This Visit's Progress    Effective management of GI sx and sx       Care Coordination Interventions: Evaluation of current treatment plan related to GI sx and sx and elevated Magnesium level and patient's adherence to plan as established by provider Advised patient to call the office for changes in her GI health and well being, constipation, or questions and concerns related to what can cause high magnesium Provided education to patient re: foods that are high in magnesium  Reviewed medications with patient and discussed compliance. The patient is taking Dance movement psychotherapist for women. On the label it states it has 100 mg of Magnesium in it. Education provided. Will also collaborate with pcp Collaborated with pcp regarding the patients question about Centrum Silver for Women and if she should stop taking and what could be an alternate substitute for the [employer] Provided patient with foods high in Magnesium, also how to prevent constipation educational materials related to elevated Magnesium and GI sx and sx of constipation and gas at times. She saw the GI specialist and she was told to take laxative BID if needed to prevent constipation, not to use straws, and other ways of preventing gas. The GI specialist does not want to do a colonoscopy because of risk the patient has if being put to sleep.  Reviewed scheduled/upcoming provider appointments including 01-22-2022 at 145 for AWV and 03-23-2022 at 1020 am with pcp Discussed  plans with patient for ongoing care management follow up and provided patient with direct contact information for care management team Advised patient to discuss changes in her GI sx and sx, or concerns about elevated Magnesium  with provider        RNCM: Effective Management of Pain       Care Coordination Interventions: The patient rates her left knee pain at a 5 today. Sometimes it hurts when she walks because of a boil she has in  her inner thigh, she says that it is much better and not painful now.  Reviewed provider established plan for pain management. 01-05-2022: The patient states she does not want to have surgery. She states that she is careful when walking and has not had falls. Education and support given.  Discussed importance of adherence to all scheduled medical appointments. The patient has an appointment for AWV on 01-22-2022 and 03-23-2022 with pcp.  Counseled on the importance of reporting any/all new or changed pain symptoms or management strategies to pain management provider Advised patient to report to care team affect of pain on daily activities. 01-05-2022: Review and education done  Discussed use of relaxation techniques and/or diversional activities to assist with pain reduction (distraction, imagery, relaxation, massage, acupressure, TENS, heat, and cold application Reviewed with patient prescribed pharmacological and nonpharmacological pain relief strategies Advised patient to discuss unresolved pain, changes in level or intensity of pain  with provider Screening for  signs and symptoms of depression related to chronic disease state  Assessed social determinant of health barriers AWV: The patient is scheduled to have her AWV on 01-22-2022. Reminder given today 01-05-2022           SDOH assessments and interventions completed:  Yes  SDOH Interventions Today    Flowsheet Row Most Recent Value  SDOH Interventions   Food Insecurity Interventions Intervention Not  Indicated  Financial Strain Interventions Intervention Not Indicated  Housing Interventions Intervention Not Indicated  Stress Interventions Intervention Not Indicated  Social Connections Interventions Intervention Not Indicated  Transportation Interventions Intervention Not Indicated        Care Coordination Interventions Activated:  Yes  Care Coordination Interventions:  Yes, provided   Follow up plan: Follow up call scheduled for 03-09-2022 at 130 pm    Encounter Outcome:  Pt. Visit Completed   Noreene Larsson RN, MSN, [facility] Mobile: [phone]

## 2022-01-20 ENCOUNTER — Ambulatory Visit (INDEPENDENT_AMBULATORY_CARE_PROVIDER_SITE_OTHER): Payer: Medicare HMO | Admitting: *Deleted

## 2022-01-20 DIAGNOSIS — Z1231 Encounter for screening mammogram for malignant neoplasm of breast: Secondary | ICD-10-CM | POA: Diagnosis not present

## 2022-01-20 DIAGNOSIS — Z0000 Encounter for general adult medical examination without abnormal findings: Secondary | ICD-10-CM | POA: Diagnosis not present

## 2022-01-20 NOTE — Patient Instructions (Signed)
Cindy [name] , Thank you for taking time to come for your Medicare Wellness Visit. I appreciate your ongoing commitment to your health goals. Please review the following plan we discussed and let me know if I can assist you in the future.   Screening recommendations/referrals: Colonoscopy: Education provided Mammogram: Education provided Bone Density: up to date Recommended yearly ophthalmology/optometry visit for glaucoma screening and checkup Recommended yearly dental visit for hygiene and checkup  Vaccinations: Influenza vaccine: up to date Pneumococcal vaccine: Education provided Tdap vaccine: up to date Shingles vaccine: Education provided    Advanced directives: Education provided  Conditions/risks identified:   Next appointment: 03-23-2022  @ 10:20  [place]   Preventive Care 72 Years and Older, Female Preventive care refers to lifestyle choices and visits with your health care provider that can promote health and wellness. What does preventive care include? A yearly physical exam. This is also called an annual well check. Dental exams once or twice a year. Routine eye exams. Ask your health care provider how often you should have your eyes checked. Personal lifestyle choices, including: Daily care of your teeth and gums. Regular physical activity. Eating a healthy diet. Avoiding tobacco and drug use. Limiting alcohol use. Practicing safe sex. Taking low-dose aspirin every day. Taking vitamin and mineral supplements as recommended by your health care provider. What happens during an annual well check? The services and screenings done by your health care provider during your annual well check will depend on your age, overall health, lifestyle risk factors, and family history of disease. Counseling  Your health care provider may ask you questions about your: Alcohol use. Tobacco use. Drug use. Emotional well-being. Home and relationship well-being. Sexual  activity. Eating habits. History of falls. Memory and ability to understand (cognition). Work and work Statistician. Reproductive health. Screening  You may have the following tests or measurements: Height, weight, and BMI. Blood pressure. Lipid and cholesterol levels. These may be checked every 5 years, or more frequently if you are over 66 years old. Skin check. Lung cancer screening. You may have this screening every year starting at age 29 if you have a 30-pack-year history of smoking and currently smoke or have quit within the past 15 years. Fecal occult blood test (FOBT) of the stool. You may have this test every year starting at age 33. Flexible sigmoidoscopy or colonoscopy. You may have a sigmoidoscopy every 5 years or a colonoscopy every 10 years starting at age 28. Hepatitis C blood test. Hepatitis B blood test. Sexually transmitted disease (STD) testing. Diabetes screening. This is done by checking your blood sugar (glucose) after you have not eaten for a while (fasting). You may have this done every 1-3 years. Bone density scan. This is done to screen for osteoporosis. You may have this done starting at age 36. Mammogram. This may be done every 1-2 years. Talk to your health care provider about how often you should have regular mammograms. Talk with your health care provider about your test results, treatment options, and if necessary, the need for more tests. Vaccines  Your health care provider may recommend certain vaccines, such as: Influenza vaccine. This is recommended every year. Tetanus, diphtheria, and acellular pertussis (Tdap, Td) vaccine. You may need a Td booster every 10 years. Zoster vaccine. You may need this after age 44. Pneumococcal 13-valent conjugate (PCV13) vaccine. One dose is recommended after age 52. Pneumococcal polysaccharide (PPSV23) vaccine. One dose is recommended after age 80. Talk to your health care provider about  which screenings and vaccines  you need and how often you need them. This information is not intended to replace advice given to you by your health care provider. Make sure you discuss any questions you have with your health care provider. Document Released: 06/13/2015 Document Revised: 02/04/2016 Document Reviewed: 03/18/2015 Elsevier Interactive Patient Education  2017 [employer] Prevention in the Home Falls can cause injuries. They can happen to people of all ages. There are many things you can do to make your home safe and to help prevent falls. What can I do on the outside of my home? Regularly fix the edges of walkways and driveways and fix any cracks. Remove anything that might make you trip as you walk through a door, such as a raised step or threshold. Trim any bushes or trees on the path to your home. Use bright outdoor lighting. Clear any walking paths of anything that might make someone trip, such as rocks or tools. Regularly check to see if handrails are loose or broken. Make sure that both sides of any steps have handrails. Any raised decks and porches should have guardrails on the edges. Have any leaves, snow, or ice cleared regularly. Use sand or salt on walking paths during winter. Clean up any spills in your garage right away. This includes oil or grease spills. What can I do in the bathroom? Use night lights. Install grab bars by the toilet and in the tub and shower. Do not use towel bars as grab bars. Use non-skid mats or decals in the tub or shower. If you need to sit down in the shower, use a plastic, non-slip stool. Keep the floor dry. Clean up any water that spills on the floor as soon as it happens. Remove soap buildup in the tub or shower regularly. Attach bath mats securely with double-sided non-slip rug tape. Do not have throw rugs and other things on the floor that can make you trip. What can I do in the bedroom? Use night lights. Make sure that you have a light by your bed that  is easy to reach. Do not use any sheets or blankets that are too big for your bed. They should not hang down onto the floor. Have a firm chair that has side arms. You can use this for support while you get dressed. Do not have throw rugs and other things on the floor that can make you trip. What can I do in the kitchen? Clean up any spills right away. Avoid walking on wet floors. Keep items that you use a lot in easy-to-reach places. If you need to reach something above you, use a strong step stool that has a grab bar. Keep electrical cords out of the way. Do not use floor polish or wax that makes floors slippery. If you must use wax, use non-skid floor wax. Do not have throw rugs and other things on the floor that can make you trip. What can I do with my stairs? Do not leave any items on the stairs. Make sure that there are handrails on both sides of the stairs and use them. Fix handrails that are broken or loose. Make sure that handrails are as long as the stairways. Check any carpeting to make sure that it is firmly attached to the stairs. Fix any carpet that is loose or worn. Avoid having throw rugs at the top or bottom of the stairs. If you do have throw rugs, attach them to the floor with  carpet tape. Make sure that you have a light switch at the top of the stairs and the bottom of the stairs. If you do not have them, ask someone to add them for you. What else can I do to help prevent falls? Wear shoes that: Do not have high heels. Have rubber bottoms. Are comfortable and fit you well. Are closed at the toe. Do not wear sandals. If you use a stepladder: Make sure that it is fully opened. Do not climb a closed stepladder. Make sure that both sides of the stepladder are locked into place. Ask someone to hold it for you, if possible. Clearly mark and make sure that you can see: Any grab bars or handrails. First and last steps. Where the edge of each step is. Use tools that help you  move around (mobility aids) if they are needed. These include: Canes. Walkers. Scooters. Crutches. Turn on the lights when you go into a dark area. Replace any light bulbs as soon as they burn out. Set up your furniture so you have a clear path. Avoid moving your furniture around. If any of your floors are uneven, fix them. If there are any pets around you, be aware of where they are. Review your medicines with your doctor. Some medicines can make you feel dizzy. This can increase your chance of falling. Ask your doctor what other things that you can do to help prevent falls. This information is not intended to replace advice given to you by your health care provider. Make sure you discuss any questions you have with your health care provider. Document Released: 03/13/2009 Document Revised: 10/23/2015 Document Reviewed: 06/21/2014 Elsevier Interactive Patient Education  2017 [employer].

## 2022-01-20 NOTE — Progress Notes (Signed)
Subjective:   Cindy [name] is a 71 y.o. female who presents for Medicare Annual (Subsequent) preventive examination.  I connected with  Bland Span on 01/20/22 by a telephone enabled telemedicine application and verified that I am speaking with the correct person using two identifiers.   I discussed the limitations of evaluation and management by telemedicine. The patient expressed understanding and agreed to proceed.  Patient location: home  Provider location: Tele-Health-home    Review of Systems     Cardiac Risk Factors include: advanced age (>16mn, >>106women);diabetes mellitus;obesity (BMI >30kg/m2);hypertension;smoking/ tobacco exposure;sedentary lifestyle     Objective:    Today's Vitals   01/20/22 1133  PainSc: 5    There is no height or weight on file to calculate BMI.     01/20/2022   11:35 AM 01/19/2021    2:05 PM 11/19/2020    8:32 PM 11/19/2020    5:46 PM 07/15/2020    8:48 PM 04/18/2018   11:39 AM 04/26/2017    1:13 PM  Advanced Directives  Does Patient Have a Medical Advance Directive? No No No No No No No  Would patient like information on creating a medical advance directive?   No - Patient declined  No - Patient declined No - Patient declined No - Patient declined    Current Medications (verified) Outpatient Encounter Medications as of 01/20/2022  Medication Sig   acetaminophen (TYLENOL) 650 MG CR tablet Take 650 mg by mouth every 8 (eight) hours as needed for pain.   albuterol (VENTOLIN HFA) 108 (90 Base) MCG/ACT inhaler Inhale 1-2 puffs into the lungs every 6 (six) hours as needed for wheezing or shortness of breath.   apixaban (ELIQUIS) 5 MG TABS tablet Take 1 tablet (5 mg total) by mouth 2 (two) times daily.   atorvastatin (LIPITOR) 40 MG tablet Take 1 tablet (40 mg total) by mouth daily.   bisoprolol (ZEBETA) 10 MG tablet Take 1 tablet (10 mg total) by mouth 2 (two) times daily.   busPIRone (BUSPAR) 5 MG tablet Take 1 tablet (5 mg total) by  mouth 2 (two) times daily.   calcitRIOL (ROCALTROL) 0.25 MCG capsule Take 1 capsule by mouth daily.   cetirizine (ZYRTEC) 10 MG tablet Take 10 mg by mouth daily.   diltiazem (CARDIZEM CD) 360 MG 24 hr capsule Take 360 mg by mouth daily.   DULoxetine (CYMBALTA) 60 MG capsule Take 60 mg by mouth daily.   empagliflozin (JARDIANCE) 25 MG TABS tablet Take 1 tablet (25 mg total) by mouth daily before breakfast.   ferrous sulfate 325 (65 FE) MG tablet Take 1 tablet (325 mg total) by mouth 2 (two) times daily with a meal.   fluticasone (FLONASE) 50 MCG/ACT nasal spray Place 2 sprays into both nostrils daily.   furosemide (LASIX) 80 MG tablet TAKE ONE TABLET BY MOUTH TWICE DAILY FOR FLUID   minoxidil (LONITEN) 10 MG tablet TAKE ONE TABLET BY MOUTH ONCE DAILY FOR BLOOD PRESSURE   montelukast (SINGULAIR) 10 MG tablet Take 1 tablet (10 mg total) by mouth at bedtime.   Multiple Vitamin (MULTIVITAMIN WITH MINERALS) TABS tablet Take 1 tablet by mouth daily.   mupirocin ointment (BACTROBAN) 2 % Apply 1 Application topically 2 (two) times daily.   olmesartan (BENICAR) 40 MG tablet Take 40 mg by mouth daily.   omeprazole (PRILOSEC) 20 MG capsule Take 20 mg by mouth daily as needed (heartburn).    polyethylene glycol powder (GLYCOLAX/MIRALAX) 17 GM/SCOOP powder MIX 17 GRAMS(1 CAPFUL)  OF POWDER IN 4 TO 8 OUNCES OF FLUID AND TAKE BY MOUTH ONCE DAILY AS NEEDED FOR CONSTIPATION   Semaglutide,0.25 or 0.'5MG'$ /DOS, (OZEMPIC, 0.25 OR 0.5 MG/DOSE,) 2 MG/1.5ML SOPN Inject 0.5 mg into the skin once a week.   spironolactone (ALDACTONE) 25 MG tablet Take 1 tablet (25 mg total) by mouth daily.   No facility-administered encounter medications on file as of 01/20/2022.    Allergies (verified) Bupropion, Gabapentin, Pioglitazone, Vicodin [hydrocodone-acetaminophen], and Vicoprofen [hydrocodone-ibuprofen]   History: Past Medical History:  Diagnosis Date   Chronic pain syndrome    Depression    Diabetes mellitus without  complication (HCC)    GERD (gastroesophageal reflux disease)    History of hiatal hernia    Hypertension    Iron deficiency anemia    Microalbuminuric diabetic nephropathy (HCC)    Mitral regurgitation    Obesity    Shortness of breath dyspnea    Sleep apnea    Past Surgical History:  Procedure Laterality Date   ABDOMINAL HYSTERECTOMY     BACK SURGERY     BREAST BIOPSY Right 03/2003   CARDIAC CATHETERIZATION N/A 10/10/2014   Procedure: Left Heart Cath;  Surgeon: Isaias Cowman, MD;  Location: [facility];  Service: Cardiovascular;  Laterality: N/A;   CARDIAC CATHETERIZATION     carpal tunnell release  10/23/11   COLONOSCOPY WITH PROPOFOL N/A 02/20/2015   Procedure: COLONOSCOPY WITH PROPOFOL;  Surgeon: Hulen Luster, MD;  Location: [facility] ENDOSCOPY;  Service: Gastroenterology;  Laterality: N/A;   COLONOSCOPY WITH PROPOFOL N/A 04/26/2017   Procedure: COLONOSCOPY WITH PROPOFOL;  Surgeon: Toledo, Benay Pike, MD;  Location: [facility] ENDOSCOPY;  Service: Gastroenterology;  Laterality: N/A;   ESOPHAGOGASTRODUODENOSCOPY (EGD) WITH PROPOFOL N/A 02/20/2015   Procedure: ESOPHAGOGASTRODUODENOSCOPY (EGD) WITH PROPOFOL;  Surgeon: Hulen Luster, MD;  Location: [facility] ENDOSCOPY;  Service: Gastroenterology;  Laterality: N/A;   ESOPHAGOGASTRODUODENOSCOPY (EGD) WITH PROPOFOL N/A 04/26/2017   Procedure: ESOPHAGOGASTRODUODENOSCOPY (EGD) WITH PROPOFOL;  Surgeon: Toledo, Benay Pike, MD;  Location: [facility] ENDOSCOPY;  Service: Gastroenterology;  Laterality: N/A;   HERNIA REPAIR     LAPAROSCOPIC OVARIAN CYSTECTOMY  08/2001   nissen fundoplycation  11/99   NOSE SURGERY     Family History  Problem Relation Age of Onset   Heart attack Mother    Hypertension Father    Stroke Father    Heart attack Brother    Social History   Socioeconomic History   Marital status: Married    Spouse name: Not on file   Number of children: Not on file   Years of education: Not on file   Highest education level: Not on file   Occupational History   Not on file  Tobacco Use   Smoking status: Never   Smokeless tobacco: Current    Types: Snuff  Vaping Use   Vaping Use: Never used  Substance and Sexual Activity   Alcohol use: No   Drug use: No   Sexual activity: Not Currently  Other Topics Concern   Not on file  Social History Narrative   Not on file   Social Determinants of Health   Financial Resource Strain: Low Risk  (01/20/2022)   Overall Financial Resource Strain (CARDIA)    Difficulty of Paying Living Expenses: Not hard at all  Food Insecurity: No Food Insecurity (01/20/2022)   Hunger Vital Sign    Worried About Running Out of Food in the Last Year: Never true    [place] in the Last Year: Never  true  Transportation Needs: No Transportation Needs (01/20/2022)   PRAPARE - Hydrologist (Medical): No    Lack of Transportation (Non-Medical): No  Physical Activity: Insufficiently Active (01/20/2022)   Exercise Vital Sign    Days of Exercise per Week: 2 days    Minutes of Exercise per Session: 10 min  Stress: No Stress Concern Present (01/20/2022)   [place]    Feeling of Stress : Not at all  Social Connections: Moderately Integrated (01/20/2022)   Social Connection and Isolation Panel [NHANES]    Frequency of Communication with Friends and Family: Three times a week    Frequency of Social Gatherings with Friends and Family: Twice a week    Attends Religious Services: More than 4 times per year    Active Member of [employer] or Organizations: No    Attends Music therapist: Never    Marital Status: Married    Tobacco Counseling Ready to quit: Not Answered Counseling given: Not Answered   Clinical Intake:  Pre-visit preparation completed: Yes  Pain : 0-10 Pain Score: 5  Pain Location: Knee Pain Orientation: Left, Right Pain Descriptors / Indicators: Aching, Dull Pain Onset:  More than a month ago Pain Frequency: Intermittent Pain Relieving Factors: tylenol athritis  Pain Relieving Factors: tylenol athritis  Diabetes: Yes CBG done?: No Did pt. bring in CBG monitor from home?: No  How often do you need to have someone help you when you read instructions, pamphlets, or other written materials from your doctor or pharmacy?: 1 - Never  Diabetic?  Yes  Nutrition Risk Assessment:  Has the patient had any N/V/D within the last 2 months?  No  Does the patient have any non-healing wounds?  No  Has the patient had any unintentional weight loss or weight gain?  No   Diabetes:  Is the patient diabetic?  Yes  If diabetic, was a CBG obtained today?  No  Did the patient bring in their glucometer from home?  No  How often do you monitor your CBG's?  2X a week.   Financial Strains and Diabetes Management:  Are you having any financial strains with the device, your supplies or your medication? No .  Does the patient want to be seen by Chronic Care Management for management of their diabetes?  No  Would the patient like to be referred to a Nutritionist or for Diabetic Management?  No   Diabetic Exams:  Diabetic Eye Exam: Overdue for diabetic eye exam. Pt has been advised about the importance in completing this exam.  Diabetic Foot Exam:  Pt has been advised about the importance in completing this exam.   Interpreter Needed?: No  Information entered by :: Leroy Kennedy LPN   Activities of Daily Living    01/20/2022   11:44 AM  In your present state of health, do you have any difficulty performing the following activities:  Hearing? 1  Vision? 0  Difficulty concentrating or making decisions? 0  Walking or climbing stairs? 1  Dressing or bathing? 0  Preparing Food and eating ? N  Using the Toilet? N  In the past six months, have you accidently leaked urine? Y  Do you have problems with loss of bowel control? N  Managing your Medications? N  Managing  your Finances? N  Housekeeping or managing your Housekeeping? N    Patient Care Team: Venita Lick, NP as PCP - General (  Nurse Practitioner) Vanita Ingles, RN as Registered Nurse (General Practice)  Indicate any recent Medical Services you may have received from other than [facility] providers in the past year (date may be approximate).     Assessment:   This is a routine wellness examination for [place].  Hearing/Vision screen Hearing Screening - Comments:: Some trouble out of both ears Does not have hearing aids Vision Screening - Comments:: [facility] Up to date  Dietary issues and exercise activities discussed: Current Exercise Habits: Home exercise routine, Type of exercise: strength training/weights, Time (Minutes): 10, Frequency (Times/Week): 3, Weekly Exercise (Minutes/Week): 30, Intensity: Mild   Goals Addressed             This Visit's Progress    Weight (lb) < 200 lb (90.7 kg)         Depression Screen    01/20/2022   11:43 AM 12/21/2021   10:21 AM 09/21/2021   10:18 AM 06/23/2021   11:34 AM 03/23/2021   11:20 AM 01/19/2021    2:07 PM 12/03/2020    2:42 PM  PHQ 2/9 Scores  PHQ - 2 Score '1 3 4 5 4 '$ 0 2  PHQ- 9 Score '5 7 10 17 14  7    '$ Fall Risk    01/20/2022   11:35 AM 12/21/2021   10:20 AM 09/21/2021   10:17 AM 03/23/2021   11:22 AM 01/19/2021    2:07 PM  [place] in the past year? 0 0 0 0 0  Number falls in past yr: 0 0 0 0   Injury with Fall? 0 0 0 0   Risk for fall due to :  No Fall Risks No Fall Risks  Medication side effect  Follow up Falls evaluation completed;Education provided;Falls prevention discussed Falls evaluation completed Falls evaluation completed  Falls evaluation completed;Education provided;Falls prevention discussed    FALL RISK PREVENTION PERTAINING TO THE HOME:  Any stairs in or around the home? Yes  If so, are there any without handrails? No  Home free of loose throw rugs in walkways, pet beds, electrical cords, etc?  Yes  Adequate lighting in your home to reduce risk of falls? Yes   ASSISTIVE DEVICES UTILIZED TO PREVENT FALLS:  Life alert? No  Use of a cane, walker or w/c? Yes  Grab bars in the bathroom? Yes  Shower chair or bench in shower? Yes  Elevated toilet seat or a handicapped toilet? No   TIMED UP AND GO:  Was the test performed? No .    Cognitive Function:        01/20/2022   11:40 AM 01/19/2021    2:11 PM  6CIT Screen  What Year? 0 points 0 points  What month? 0 points 0 points  What time? 0 points 0 points  Count back from 20 0 points 0 points  Months in reverse 0 points 2 points  Repeat phrase 0 points 0 points  Total Score 0 points 2 points    Immunizations Immunization History  Administered Date(s) Administered   Moderna Sars-Covid-2 Vaccination 11/18/2020, 12/16/2020   Pneumococcal Conjugate-13 03/23/2021   Td 12/21/2021    TDAP status: Up to date  Flu Vaccine status: Up to date  Pneumococcal vaccine status: Due, Education has been provided regarding the importance of this vaccine. Advised may receive this vaccine at local pharmacy or Health Dept. Aware to provide a copy of the vaccination record if obtained from local pharmacy or Health Dept. Verbalized acceptance  and understanding.  Covid-19 vaccine status: Declined, Education has been provided regarding the importance of this vaccine but patient still declined. Advised may receive this vaccine at local pharmacy or Health Dept.or vaccine clinic. Aware to provide a copy of the vaccination record if obtained from local pharmacy or Health Dept. Verbalized acceptance and understanding.  Qualifies for Shingles Vaccine? Yes   Zostavax completed No   Shingrix Completed?: No.    Education has been provided regarding the importance of this vaccine. Patient has been advised to call insurance company to determine out of pocket expense if they have not yet received this vaccine. Advised may also receive vaccine at local  pharmacy or Health Dept. Verbalized acceptance and understanding.  Screening Tests Health Maintenance  Topic Date Due   OPHTHALMOLOGY EXAM  Never done   COVID-19 Vaccine (3 - Moderna risk series) 01/13/2021   Pneumonia Vaccine 60+ Years old (2 - PPSV23 or PCV20) 03/23/2022 (Originally 05/18/2021)   Zoster Vaccines- Shingrix (1 of 2) 03/23/2022 (Originally 01/26/1970)   MAMMOGRAM  04/22/2022   HEMOGLOBIN A1C  06/23/2022   FOOT EXAM  12/22/2022   COLONOSCOPY (Pts 45-81yr Insurance coverage will need to be confirmed)  04/27/2027   DEXA SCAN  04/28/2031   TETANUS/TDAP  12/22/2031   Hepatitis C Screening  Completed   HPV VACCINES  Aged Out   INFLUENZA VACCINE  Discontinued    Health Maintenance  Health Maintenance Due  Topic Date Due   OPHTHALMOLOGY EXAM  Never done   COVID-19 Vaccine (3 - Moderna risk series) 01/13/2021    Colorectal cancer screening: Type of screening: Colonoscopy. Completed 2018. Repeat every patient states she has seen Gastro and they did not want to do it. years  Mammogram status: Ordered  . Pt provided with contact info and advised to call to schedule appt.   Bone Density status: Completed 2022. Results reflect: Bone density results: NORMAL. Repeat every 10 years.  Lung Cancer Screening: (Low Dose CT Chest recommended if Age 72-80years, 30 pack-year currently smoking OR have quit w/in 15years.) does not qualify.   Lung Cancer Screening Referral:   Additional Screening:  Hepatitis C Screening: does not qualify; Completed 2022  Vision Screening: Recommended annual ophthalmology exams for early detection of glaucoma and other disorders of the eye. Is the patient up to date with their annual eye exam?  Yes  Who is the provider or what is the name of the office in which the patient attends annual eye exams? [facility] If pt is not established with a provider, would they like to be referred to a provider to establish care? No .   Dental Screening:  Recommended annual dental exams for proper oral hygiene  Community Resource Referral / Chronic Care Management: CRR required this visit?  No   CCM required this visit?  No      Plan:     I have personally reviewed and noted the following in the patient's chart:   Medical and social history Use of alcohol, tobacco or illicit drugs  Current medications and supplements including opioid prescriptions. Patient is not currently taking opioid prescriptions. Functional ability and status Nutritional status Physical activity Advanced directives List of other physicians Hospitalizations, surgeries, and ER visits in previous 12 months Vitals Screenings to include cognitive, depression, and falls Referrals and appointments  In addition, I have reviewed and discussed with patient certain preventive protocols, quality metrics, and best practice recommendations. A written personalized care plan for preventive services as well as  general preventive health recommendations were provided to patient.     Leroy Kennedy, LPN   [id number]   Nurse Notes:

## 2022-01-22 ENCOUNTER — Ambulatory Visit: Payer: Medicare HMO

## 2022-03-03 ENCOUNTER — Ambulatory Visit
Admission: RE | Admit: 2022-03-03 | Discharge: 2022-03-03 | Disposition: A | Discharge status: Home / Self Care | Payer: Medicare HMO | Source: Ambulatory Visit | Attending: Nurse Practitioner | Admitting: Nurse Practitioner

## 2022-03-03 DIAGNOSIS — Z1231 Encounter for screening mammogram for malignant neoplasm of breast: Secondary | ICD-10-CM | POA: Insufficient documentation

## 2022-03-04 ENCOUNTER — Other Ambulatory Visit: Payer: Self-pay | Admitting: Nurse Practitioner

## 2022-03-04 DIAGNOSIS — R928 Other abnormal and inconclusive findings on diagnostic imaging of breast: Secondary | ICD-10-CM

## 2022-03-04 DIAGNOSIS — N6489 Other specified disorders of breast: Secondary | ICD-10-CM

## 2022-03-04 NOTE — Progress Notes (Signed)
Good morning please let Cindy [name] know her mammogram returned abnormal to left side with need for repeat imaging.  This is common to need further assessment.  Please ensure to schedule. [facility] at [facility]  Address: [address], [place], [place] [address] Phone: [phone]

## 2022-03-04 NOTE — Telephone Encounter (Signed)
Requested Prescriptions  Pending Prescriptions Disp Refills  . bisoprolol (ZEBETA) 10 MG tablet [Pharmacy Med Name: BISOPROLOL FUMARATE '10MG'$  TABLETS] 180 tablet 0    Sig: TAKE 1 TABLET(10 MG) BY MOUTH TWICE DAILY     Cardiovascular: Beta Blockers 2 Failed - 03/04/2022  3:40 AM      Failed - Cr in normal range and within 360 days    Creatinine  Date Value Ref Range Status  01/24/2014 0.86 0.60 - 1.30 mg/dL Final   Creatinine, Ser  Date Value Ref Range Status  03/23/2021 1.70 (H) 0.57 - 1.00 mg/dL Final         Passed - Last BP in normal range    BP Readings from Last 1 Encounters:  12/21/21 120/64         Passed - Last Heart Rate in normal range    Pulse Readings from Last 1 Encounters:  12/21/21 60         Passed - Valid encounter within last 6 months    Recent Outpatient Visits          2 months ago Type 2 diabetes mellitus with morbid obesity ([place])   [place], Jolene T, NP   5 months ago Type 2 diabetes mellitus with morbid obesity ([place])   [place], Jolene T, NP   7 months ago Type 2 diabetes mellitus with morbid obesity ([place])   [place], Jolene T, NP   8 months ago Type 2 diabetes mellitus with morbid obesity ([place])   [place], Jolene T, NP   11 months ago Type 2 diabetes mellitus with morbid obesity ([place])   [place], Barbaraann Faster, NP      Future Appointments            In 2 weeks [place], Barbaraann Faster, NP [employer]

## 2022-03-09 ENCOUNTER — Ambulatory Visit: Payer: Self-pay

## 2022-03-09 NOTE — Patient Outreach (Signed)
Care Coordination   Follow Up Visit Note   03/09/2022 Name: Cindy [name] MRN: [id number] DOB: [birth year]  Cindy [name] is a 71 y.o. year old female who sees [place], Henrine Screws T, NP for primary care. I spoke with  Bland Span by phone today.  What matters to the patients health and wellness today?  Pain management and the patient still has the area at her groin that she took abx and cream for.     Goals Addressed             This Visit's Progress    RNCM: Effective Management of DM       Care Coordination Interventions:  Lab Results  Component Value Date   HGBA1C 6.3 (H) 12/21/2021    Provided education to patient about basic DM disease process Reviewed medications with patient and discussed importance of medication adherence Counseled on importance of regular laboratory monitoring as prescribed Discussed plans with patient for ongoing care management follow up and provided patient with direct contact information for care management team Provided patient with written educational materials related to hypo and hyperglycemia and importance of correct treatment Reviewed scheduled/upcoming provider appointments including: 03-23-2022 at 1020 am Advised patient, providing education and rationale, to check cbg asd directed  and record, calling pcp for findings outside established parameters. The patient states her range has been 116-145 and hardly ever up to 145.  The patient states she still has the boil area that she saw Jolene for in the past. She said it is better but not gone away. Advised the patient to discuss with Jolene at her upcoming visit. The RNCM will also let the pcp know by in basket message. The patient denies any acute distress related to the boil.  Review of patient status, including review of consultants reports, relevant laboratory and other test results, and medications completed        RNCM: Effective Management of Pain       Care Coordination  Interventions:  Reviewed provider established plan for pain management. Still having pain but this is not limiting her from doing her activities. Is steadily losing weight. The patient feels this will help with her pain issues Discussed importance of adherence to all scheduled medical appointments. The patient has an appointment 03-23-2022 with pcp.  Counseled on the importance of reporting any/all new or changed pain symptoms or management strategies to pain management provider Advised patient to report to care team affect of pain on daily activities. Remains active and is thankful that she is able to maintain her activity level.  Discussed use of relaxation techniques and/or diversional activities to assist with pain reduction (distraction, imagery, relaxation, massage, acupressure, TENS, heat, and cold application Reviewed with patient prescribed pharmacological and nonpharmacological pain relief strategies. Patient is compliant with medications Advised patient to discuss unresolved pain, changes in level or intensity of pain  with provider Screening for signs and symptoms of depression related to chronic disease state  Assessed social determinant of health barriers AWV: completed 01-22-2022           SDOH assessments and interventions completed:  Yes  SDOH Interventions Today    Flowsheet Row Most Recent Value  SDOH Interventions   Utilities Interventions Intervention Not Indicated        Care Coordination Interventions Activated:  Yes  Care Coordination Interventions:  Yes, provided   Follow up plan: Follow up call scheduled for 05-12-2022 at 130 pm    Encounter Outcome:  Pt. Visit  Completed   Noreene Larsson RN, MSN, [facility]  Mobile: [phone]

## 2022-03-09 NOTE — Patient Instructions (Signed)
Visit Information  Thank you for taking time to visit with me today. Please don't hesitate to contact me if I can be of assistance to you.   Following are the goals we discussed today:   Goals Addressed             This Visit's Progress    RNCM: Effective Management of DM       Care Coordination Interventions:  Lab Results  Component Value Date   HGBA1C 6.3 (H) 12/21/2021    Provided education to patient about basic DM disease process Reviewed medications with patient and discussed importance of medication adherence Counseled on importance of regular laboratory monitoring as prescribed Discussed plans with patient for ongoing care management follow up and provided patient with direct contact information for care management team Provided patient with written educational materials related to hypo and hyperglycemia and importance of correct treatment Reviewed scheduled/upcoming provider appointments including: 03-23-2022 at 1020 am Advised patient, providing education and rationale, to check cbg asd directed  and record, calling pcp for findings outside established parameters. The patient states her range has been 116-145 and hardly ever up to 145.  The patient states she still has the boil area that she saw Jolene for in the past. She said it is better but not gone away. Advised the patient to discuss with Jolene at her upcoming visit. The RNCM will also let the pcp know by in basket message. The patient denies any acute distress related to the boil.  Review of patient status, including review of consultants reports, relevant laboratory and other test results, and medications completed        RNCM: Effective Management of Pain       Care Coordination Interventions:  Reviewed provider established plan for pain management. Still having pain but this is not limiting her from doing her activities. Is steadily losing weight. The patient feels this will help with her pain issues Discussed  importance of adherence to all scheduled medical appointments. The patient has an appointment 03-23-2022 with pcp.  Counseled on the importance of reporting any/all new or changed pain symptoms or management strategies to pain management provider Advised patient to report to care team affect of pain on daily activities. Remains active and is thankful that she is able to maintain her activity level.  Discussed use of relaxation techniques and/or diversional activities to assist with pain reduction (distraction, imagery, relaxation, massage, acupressure, TENS, heat, and cold application Reviewed with patient prescribed pharmacological and nonpharmacological pain relief strategies. Patient is compliant with medications Advised patient to discuss unresolved pain, changes in level or intensity of pain  with provider Screening for signs and symptoms of depression related to chronic disease state  Assessed social determinant of health barriers AWV: completed 01-22-2022           Our next appointment is by telephone on 05-12-2022 at 130 pm  Please call the care guide team at [phone] if you need to cancel or reschedule your appointment.   If you are experiencing a Mental Health or Irene or need someone to talk to, please call the Suicide and Crisis Lifeline: 988 call the [place] National Suicide Prevention Lifeline: 778-877-8203 or TTY: 772 524 1808 TTY (920) 561-0634) to talk to a trained counselor call 1-800-273-TALK (toll free, 24 hour hotline)  The patient verbalized understanding of instructions, educational materials, and care plan provided today and DECLINED offer to receive copy of patient instructions, educational materials, and care plan.   Telephone follow up appointment  with care management team member scheduled for: 05-12-2022 at 130 pm

## 2022-03-10 ENCOUNTER — Other Ambulatory Visit: Payer: Self-pay | Admitting: Nurse Practitioner

## 2022-03-10 MED ORDER — POLYETHYLENE GLYCOL 3350 17 GM/SCOOP PO POWD: ORAL | 4 refills | Status: DC

## 2022-03-10 NOTE — Telephone Encounter (Signed)
Requested Prescriptions  Pending Prescriptions Disp Refills  . diltiazem (CARDIZEM CD) 360 MG 24 hr capsule      Sig: Take 1 capsule (360 mg total) by mouth daily.     Cardiovascular: Calcium Channel Blockers 3 Failed - 03/10/2022 11:08 AM      Failed - Cr in normal range and within 360 days    Creatinine  Date Value Ref Range Status  01/24/2014 0.86 0.60 - 1.30 mg/dL Final   Creatinine, Ser  Date Value Ref Range Status  03/23/2021 1.70 (H) 0.57 - 1.00 mg/dL Final         Passed - ALT in normal range and within 360 days    ALT  Date Value Ref Range Status  03/23/2021 14 0 - 32 IU/L Final   SGPT (ALT)  Date Value Ref Range Status  01/24/2014 19 U/L Final    Comment:    14-63 NOTE: New Reference Range 12/18/13          Passed - AST in normal range and within 360 days    AST  Date Value Ref Range Status  03/23/2021 16 0 - 40 IU/L Final   SGOT(AST)  Date Value Ref Range Status  01/24/2014 14 (L) 15 - 37 Unit/L Final         Passed - Last BP in normal range    BP Readings from Last 1 Encounters:  12/21/21 120/64         Passed - Last Heart Rate in normal range    Pulse Readings from Last 1 Encounters:  12/21/21 60         Passed - Valid encounter within last 6 months    Recent Outpatient Visits          2 months ago Type 2 diabetes mellitus with morbid obesity ([place])   [place], Jolene T, NP   5 months ago Type 2 diabetes mellitus with morbid obesity ([place])   [place], Jolene T, NP   7 months ago Type 2 diabetes mellitus with morbid obesity ([place])   [place], Jolene T, NP   8 months ago Type 2 diabetes mellitus with morbid obesity ([place])   [place], Jolene T, NP   11 months ago Type 2 diabetes mellitus with morbid obesity ([place])   [place], Barbaraann Faster, NP      Future Appointments            In 1 week Cannady, Barbaraann Faster, NP [facility], [facility]           . DULoxetine (CYMBALTA) 60 MG capsule      Sig: Take 1 capsule (60 mg total) by mouth daily.     Psychiatry: Antidepressants - SNRI - duloxetine Failed - 03/10/2022 11:08 AM      Failed - Cr in normal range and within 360 days    Creatinine  Date Value Ref Range Status  01/24/2014 0.86 0.60 - 1.30 mg/dL Final   Creatinine, Ser  Date Value Ref Range Status  03/23/2021 1.70 (H) 0.57 - 1.00 mg/dL Final         Passed - eGFR is 30 or above and within 360 days    EGFR (African American)  Date Value Ref Range Status  01/24/2014 >60  Final   GFR calc Af Amer  Date Value Ref Range Status  03/19/2017 >60 >60 mL/min Final    Comment:    (  NOTE) The eGFR has been calculated using the CKD EPI equation. This calculation has not been validated in all clinical situations. eGFR's persistently <60 mL/min signify possible Chronic Kidney Disease.    EGFR (Non-African Amer.)  Date Value Ref Range Status  01/24/2014 >60  Final    Comment:    eGFR values <75m/min/1.73 m2 may be an indication of chronic kidney disease (CKD). Calculated eGFR is useful in patients with stable renal function. The eGFR calculation will not be reliable in acutely ill patients when serum creatinine is changing rapidly. It is not useful in  patients on dialysis. The eGFR calculation may not be applicable to patients at the low and high extremes of body sizes, pregnant women, and vegetarians.    GFR, Estimated  Date Value Ref Range Status  11/19/2020 27 (L) >60 mL/min Final    Comment:    (NOTE) Calculated using the CKD-EPI Creatinine Equation (2021)    eGFR  Date Value Ref Range Status  03/23/2021 32 (L) >59 mL/min/1.73 Final         Passed - Completed PHQ-2 or PHQ-9 in the last 360 days      Passed - Last BP in normal range    BP Readings from Last 1 Encounters:  12/21/21 120/64         Passed - Valid encounter within last 6 months    Recent Outpatient Visits           2 months ago Type 2 diabetes mellitus with morbid obesity ([place]   [place] T, NP   5 months ago Type 2 diabetes mellitus with morbid obesity ([place]   [place] Jolene T, NP   7 months ago Type 2 diabetes mellitus with morbid obesity ([place]   [place] Jolene T, NP   8 months ago Type 2 diabetes mellitus with morbid obesity ([place]   [place] Jolene T, NP   11 months ago Type 2 diabetes mellitus with morbid obesity ([place]   [place] JWesleyT, NP      Future Appointments            In 1 week Cannady, Jolene T, NP [facility], [facility]           . polyethylene glycol powder (GLYCOLAX/MIRALAX) 17 GM/SCOOP powder 850 g 4    Sig: MIX 17 GRAMS(1 CAPFUL) OF POWDER IN 4 TO 8 OUNCES OF FLUID AND TAKE BY MOUTH ONCE DAILY AS NEEDED FOR CONSTIPATION     Gastroenterology:  Laxatives Passed - 03/10/2022 11:08 AM      Passed - Valid encounter within last 12 months    Recent Outpatient Visits          2 months ago Type 2 diabetes mellitus with morbid obesity ([place]   [place] Jolene T, NP   5 months ago Type 2 diabetes mellitus with morbid obesity ([place]   [place] Jolene T, NP   7 months ago Type 2 diabetes mellitus with morbid obesity ([place]   [place] Jolene T, NP   8 months ago Type 2 diabetes mellitus with morbid obesity ([place]   [place] Jolene T, NP   11 months ago Type 2 diabetes mellitus with morbid obesity ([place] [name]   [place] JBarbaraann Faster NP      Future Appointments  In 1 week Cannady, Barbaraann Faster, NP [facility], [facility]

## 2022-03-10 NOTE — Telephone Encounter (Signed)
Medication Refill - Medication: DULoxetine (CYMBALTA) 60 MG capsule  diltiazem (CARDIZEM CD) 360 MG 24 hr capsule  polyethylene glycol powder (GLYCOLAX/MIRALAX) 17 GM/SCOOP powder  Has the patient contacted their pharmacy? Yes.   (Agent: If no, request that the patient contact the pharmacy for the refill. If patient does not wish to contact the pharmacy document the reason why and proceed with request.) (Agent: If yes, when and what did the pharmacy advise?)  Preferred Pharmacy (with phone number or street name):  [facility] [place], [place] [address] AT [address]  [place] [address]  Phone: [phone] Fax: [phone]   Has the patient been seen for an appointment in the last year OR does the patient have an upcoming appointment? Yes.    Agent: Please be advised that RX refills may take up to 3 business days. We ask that you follow-up with your pharmacy.

## 2022-03-10 NOTE — Telephone Encounter (Signed)
Requested medication (s) are due for refill today: yes  Requested medication (s) are on the active medication list: yes  Last refill:  Cardizem 05/30/2020, Cymbalta unknown  Future visit scheduled: 03/23/22, seen 7/3  Notes to clinic:  Historical Provider, please assess.       Requested Prescriptions  Pending Prescriptions Disp Refills   diltiazem (CARDIZEM CD) 360 MG 24 hr capsule      Sig: Take 1 capsule (360 mg total) by mouth daily.     Cardiovascular: Calcium Channel Blockers 3 Failed - 03/10/2022 11:08 AM      Failed - Cr in normal range and within 360 days    Creatinine  Date Value Ref Range Status  01/24/2014 0.86 0.60 - 1.30 mg/dL Final   Creatinine, Ser  Date Value Ref Range Status  03/23/2021 1.70 (H) 0.57 - 1.00 mg/dL Final         Passed - ALT in normal range and within 360 days    ALT  Date Value Ref Range Status  03/23/2021 14 0 - 32 IU/L Final   SGPT (ALT)  Date Value Ref Range Status  01/24/2014 19 U/L Final    Comment:    14-63 NOTE: New Reference Range 12/18/13          Passed - AST in normal range and within 360 days    AST  Date Value Ref Range Status  03/23/2021 16 0 - 40 IU/L Final   SGOT(AST)  Date Value Ref Range Status  01/24/2014 14 (L) 15 - 37 Unit/L Final         Passed - Last BP in normal range    BP Readings from Last 1 Encounters:  12/21/21 120/64         Passed - Last Heart Rate in normal range    Pulse Readings from Last 1 Encounters:  12/21/21 60         Passed - Valid encounter within last 6 months    Recent Outpatient Visits           2 months ago Type 2 diabetes mellitus with morbid obesity ([place])   [place], Jolene T, NP   5 months ago Type 2 diabetes mellitus with morbid obesity ([place])   [place], Jolene T, NP   7 months ago Type 2 diabetes mellitus with morbid obesity ([place])   [place], Jolene T, NP   8 months ago Type 2 diabetes  mellitus with morbid obesity ([place])   [place], Jolene T, NP   11 months ago Type 2 diabetes mellitus with morbid obesity ([place])   [place], [place] T, NP       Future Appointments             In 1 week Cannady, Barbaraann Faster, NP [facility], [facility]             DULoxetine (CYMBALTA) 60 MG capsule      Sig: Take 1 capsule (60 mg total) by mouth daily.     Psychiatry: Antidepressants - SNRI - duloxetine Failed - 03/10/2022 11:08 AM      Failed - Cr in normal range and within 360 days    Creatinine  Date Value Ref Range Status  01/24/2014 0.86 0.60 - 1.30 mg/dL Final   Creatinine, Ser  Date Value Ref Range Status  03/23/2021 1.70 (H) 0.57 - 1.00 mg/dL Final  Passed - eGFR is 30 or above and within 360 days    EGFR (African American)  Date Value Ref Range Status  01/24/2014 >60  Final   GFR calc Af Amer  Date Value Ref Range Status  03/19/2017 >60 >60 mL/min Final    Comment:    (NOTE) The eGFR has been calculated using the CKD EPI equation. This calculation has not been validated in all clinical situations. eGFR's persistently <60 mL/min signify possible Chronic Kidney Disease.    EGFR (Non-African Amer.)  Date Value Ref Range Status  01/24/2014 >60  Final    Comment:    eGFR values <36m/min/1.73 m2 may be an indication of chronic kidney disease (CKD). Calculated eGFR is useful in patients with stable renal function. The eGFR calculation will not be reliable in acutely ill patients when serum creatinine is changing rapidly. It is not useful in  patients on dialysis. The eGFR calculation may not be applicable to patients at the low and high extremes of body sizes, pregnant women, and vegetarians.    GFR, Estimated  Date Value Ref Range Status  11/19/2020 27 (L) >60 mL/min Final    Comment:    (NOTE) Calculated using the CKD-EPI Creatinine Equation (2021)    eGFR  Date Value Ref Range Status   03/23/2021 32 (L) >59 mL/min/1.73 Final         Passed - Completed PHQ-2 or PHQ-9 in the last 360 days      Passed - Last BP in normal range    BP Readings from Last 1 Encounters:  12/21/21 120/64         Passed - Valid encounter within last 6 months    Recent Outpatient Visits           2 months ago Type 2 diabetes mellitus with morbid obesity ([place]   [place] Jolene T, NP   5 months ago Type 2 diabetes mellitus with morbid obesity ([place]   [place] Jolene T, NP   7 months ago Type 2 diabetes mellitus with morbid obesity (HVal[place]   CG[place] Jolene T, NP   8 months ago Type 2 diabetes mellitus with morbid obesity (HAr[place]   [place] Jolene T, NP   11 months ago Type 2 diabetes mellitus with morbid obesity (HG[place]   [place] JAllisonT, NP       Future Appointments             In 1 week Cannady, JBarbaraann Faster NP [facility]            Signed Prescriptions Disp Refills   polyethylene glycol powder (GLYCOLAX/MIRALAX) 17 GM/SCOOP powder 850 g 4    Sig: MIX 17 GRAMS(1 CAPFUL) OF POWDER IN 4 TO 8 OUNCES OF FLUID AND TAKE BY MOUTH ONCE DAILY AS NEEDED FOR CONSTIPATION     Gastroenterology:  Laxatives Passed - 03/10/2022 11:08 AM      Passed - Valid encounter within last 12 months    Recent Outpatient Visits           2 months ago Type 2 diabetes mellitus with morbid obesity ([place]   [place] Jolene T, NP   5 months ago Type 2 diabetes mellitus with morbid obesity ([place]   [place] Jolene T, NP   7 months ago Type 2 diabetes mellitus with morbid obesity (HE[place]   [place] J[place]uck[place]  T, NP   8 months ago Type 2 diabetes mellitus with morbid obesity ([place])   [place] Cannady, Jolene T, NP   11 months ago Type 2 diabetes mellitus with morbid obesity ([place])    [place], Barbaraann Faster, NP       Future Appointments             In 1 week Cannady, Barbaraann Faster, NP [facility], [facility]

## 2022-03-11 MED ORDER — DILTIAZEM HCL ER COATED BEADS 360 MG PO CP24: 360.0000 mg | ORAL_CAPSULE | Freq: Every day | ORAL | 4 refills | Status: DC

## 2022-03-11 MED ORDER — DULOXETINE HCL 60 MG PO CPEP: 60.0000 mg | ORAL_CAPSULE | Freq: Every day | ORAL | 4 refills | Status: DC

## 2022-03-19 ENCOUNTER — Telehealth: Payer: Self-pay

## 2022-03-19 NOTE — Telephone Encounter (Signed)
5 boxes of Ozempic received for the patient. Called and notified patient that medication was delivered and ready to be picked up.

## 2022-03-21 NOTE — Patient Instructions (Signed)
Food Basics for Chronic Kidney Disease Chronic kidney disease (CKD) is when your kidneys are not working well. They cannot remove waste, fluids, and other substances from your blood the way they should. These substances can build up, which can worsen kidney damage and affect how your body works. Eating certain foods can lead to a buildup of these substances. Changing your diet can help prevent more kidney damage. Diet changes may also delay dialysis or even keep you from needing it. What nutrients should I limit? Work with your treatment team and a food expert (dietitian) to make a meal plan that's right for you. Foods you can eat and foods you should limit or avoid will depend on the stage of your kidney disease and any other health conditions you have. The items listed below are not a complete list. Talk with your dietitian to learn what is best for you. Potassium Potassium affects how steadily your heart beats. Too much potassium in your blood can cause an irregular heartbeat or even a heart attack. You may need to limit foods that are high in potassium, such as: Liquid milk and soy milk. Salt substitutes that contain potassium. Fruits like bananas, apricots, nectarines, melon, prunes, raisins, kiwi, and oranges. Vegetables, such as potatoes, sweet potatoes, yams, tomatoes, leafy greens, beets, avocado, pumpkin, and winter squash. Beans, like lima beans. Nuts. Phosphorus Phosphorus is a mineral found in your bones. You need a balance between calcium and phosphorus to build and maintain healthy bones. Too much added phosphorus from the foods you eat can pull calcium from your bones. Losing calcium can make your bones weak and more likely to break. Too much phosphorus can also make your skin itch. You may need to limit foods that are high in phosphorus or that have added phosphorus, such as: Liquid milk and dairy products. Dark-colored sodas or soft drinks. Bran cereals and  oatmeal. Protein  Protein helps you make and keep muscle. Protein also helps to repair your body's cells and tissues. One of the natural breakdown products of protein is a waste product called urea. When your kidneys are not working well, they cannot remove types of waste like urea. Reducing protein in your diet can help keep urea from building up in your blood. Depending on your stage of kidney disease, you may need to eat smaller portions of foods that are high in protein. Sources of animal protein include: Meat (all types). Fish and seafood. Poultry. Eggs. Dairy. Other protein foods include: Beans and legumes. Nuts and nut butter. Soy, like tofu.  Sodium Salt (sodium) helps to keep a healthy balance of fluids in your body. Too much salt can increase your blood pressure, which can harm your heart and lungs. Extra salt can also cause your body to keep too much fluid, making your kidneys work harder. You may need to limit or avoid foods that are high in salt, such as: Salt seasonings. Soy and teriyaki sauce. Packaged, precooked, cured, or processed meats, such as sausages or meat loaves. Sardines. Salted crackers and snack foods. Fast food. Canned soups and most canned foods. Pickled foods. Vegetable juice. Boxed mixes or ready-to-eat boxed meals and side dishes. Bottled dressings, sauces, and marinades. Talk with your dietitian about how much potassium, phosphorus, protein, and salt you may have each day. Helpful tips Read food labels  Check the amount of salt in foods. Limit foods that have salt or sodium listed among the first five ingredients. Try to eat low-salt foods. Check the ingredient list   for added phosphorus or potassium. "Phos" in an ingredient is a sign that phosphorus has been added. Do not buy foods that are calcium-enriched or that have calcium added to them (are fortified). Buy canned vegetables and beans that say "no salt added" and rinse them before  eating. Lifestyle Limit the amount of protein you eat from animal sources each day. Focus on protein from plant sources, like tofu and dried beans, peas, and lentils. Do not add salt to food when cooking or before eating. Do not eat star fruit. It can be toxic for people with kidney problems. Talk with your health care provider before taking any vitamin or mineral supplements. If told by your health care provider, track how much liquid you drink so you can avoid drinking too much. You may need to include foods you eat that are made mostly from water, like gelatin, ice cream, soups, and juicy fruits and vegetables. If you have diabetes: If you have diabetes (diabetes mellitus) and CKD, you need to keep your blood sugar (glucose) in the target range recommended by your health care provider. Follow your diabetes management plan. This may include: Checking your blood glucose regularly. Taking medicines by mouth, or taking insulin, or both. Exercising for at least 30 minutes on 5 or more days each week, or as told by your health care provider. Tracking how many servings of carbohydrates you eat at each meal. Not using orange juice to treat low blood sugars. Instead, use apple juice, cranberry juice, or clear soda. You may be given guidelines on what foods and nutrients you may eat, and how much you can have each day. This depends on your stage of kidney disease and whether you have high blood pressure (hypertension). Follow the meal plan your dietitian gives you. To learn more: National Institute of Diabetes and Digestive and Kidney Diseases: niddk.nih.gov National Kidney Foundation: kidney.org Summary Chronic kidney disease (CKD) is when your kidneys are not working well. They cannot remove waste, fluids, and other substances from your blood the way they should. These substances can build up, which can worsen kidney damage and affect how your body works. Changing your diet can help prevent more  kidney damage. Diet changes may also delay dialysis or even keep you from needing it. Diet changes are different for each person with CKD. Work with a dietitian to set up a meal plan that is right for you. This information is not intended to replace advice given to you by your health care provider. Make sure you discuss any questions you have with your health care provider. Document Revised: 09/04/2021 Document Reviewed: 09/10/2019 Elsevier Patient Education  2023 Elsevier Inc.  

## 2022-03-23 ENCOUNTER — Ambulatory Visit (INDEPENDENT_AMBULATORY_CARE_PROVIDER_SITE_OTHER): Payer: Medicare HMO | Admitting: Nurse Practitioner

## 2022-03-23 ENCOUNTER — Encounter: Payer: Self-pay | Admitting: Nurse Practitioner

## 2022-03-23 DIAGNOSIS — I152 Hypertension secondary to endocrine disorders: Secondary | ICD-10-CM | POA: Diagnosis not present

## 2022-03-23 DIAGNOSIS — E1159 Type 2 diabetes mellitus with other circulatory complications: Secondary | ICD-10-CM

## 2022-03-23 DIAGNOSIS — G4733 Obstructive sleep apnea (adult) (pediatric): Secondary | ICD-10-CM

## 2022-03-23 DIAGNOSIS — F321 Major depressive disorder, single episode, moderate: Secondary | ICD-10-CM

## 2022-03-23 DIAGNOSIS — E785 Hyperlipidemia, unspecified: Secondary | ICD-10-CM

## 2022-03-23 DIAGNOSIS — I340 Nonrheumatic mitral (valve) insufficiency: Secondary | ICD-10-CM

## 2022-03-23 DIAGNOSIS — D6869 Other thrombophilia: Secondary | ICD-10-CM | POA: Diagnosis not present

## 2022-03-23 DIAGNOSIS — E1169 Type 2 diabetes mellitus with other specified complication: Secondary | ICD-10-CM

## 2022-03-23 DIAGNOSIS — E210 Primary hyperparathyroidism: Secondary | ICD-10-CM | POA: Diagnosis not present

## 2022-03-23 DIAGNOSIS — I700 Atherosclerosis of aorta: Secondary | ICD-10-CM

## 2022-03-23 DIAGNOSIS — D631 Anemia in chronic kidney disease: Secondary | ICD-10-CM

## 2022-03-23 DIAGNOSIS — I480 Paroxysmal atrial fibrillation: Secondary | ICD-10-CM

## 2022-03-23 DIAGNOSIS — N184 Chronic kidney disease, stage 4 (severe): Secondary | ICD-10-CM | POA: Diagnosis not present

## 2022-03-23 DIAGNOSIS — E1122 Type 2 diabetes mellitus with diabetic chronic kidney disease: Secondary | ICD-10-CM | POA: Diagnosis not present

## 2022-03-23 DIAGNOSIS — Z23 Encounter for immunization: Secondary | ICD-10-CM

## 2022-03-23 LAB — BAYER DCA HB A1C WAIVED: HB A1C (BAYER DCA - WAIVED): 6.2 % — ABNORMAL HIGH (ref 4.8–5.6)

## 2022-03-23 MED ORDER — SPIRONOLACTONE 25 MG PO TABS: 25.0000 mg | ORAL_TABLET | Freq: Every day | ORAL | 4 refills | Status: DC

## 2022-03-23 MED ORDER — ATORVASTATIN CALCIUM 40 MG PO TABS: 40.0000 mg | ORAL_TABLET | Freq: Every day | ORAL | 4 refills | Status: DC

## 2022-03-23 MED ORDER — BISOPROLOL FUMARATE 10 MG PO TABS: ORAL_TABLET | ORAL | 4 refills | Status: DC

## 2022-03-23 MED ORDER — AZELASTINE HCL 0.1 % NA SOLN: 1.0000 | Freq: Two times a day (BID) | NASAL | 12 refills | Status: DC

## 2022-03-23 MED ORDER — FLUTICASONE PROPIONATE 50 MCG/ACT NA SUSP: 2.0000 | Freq: Every day | NASAL | 7 refills | Status: DC

## 2022-03-23 MED ORDER — BUSPIRONE HCL 5 MG PO TABS: 5.0000 mg | ORAL_TABLET | Freq: Two times a day (BID) | ORAL | 4 refills | Status: DC

## 2022-03-23 MED ORDER — MONTELUKAST SODIUM 10 MG PO TABS: 10.0000 mg | ORAL_TABLET | Freq: Every day | ORAL | 4 refills | Status: DC

## 2022-03-23 MED ORDER — FUROSEMIDE 80 MG PO TABS: ORAL_TABLET | ORAL | 4 refills | Status: DC

## 2022-03-23 MED ORDER — MINOXIDIL 10 MG PO TABS: ORAL_TABLET | ORAL | 4 refills | Status: DC

## 2022-03-23 MED ORDER — OLMESARTAN MEDOXOMIL 40 MG PO TABS: 40.0000 mg | ORAL_TABLET | Freq: Every day | ORAL | 4 refills | Status: DC

## 2022-03-23 NOTE — Assessment & Plan Note (Signed)
Chronic, ongoing.  Continue collaboration with nephrology and current medication regimen. Recent labs performed by them and stable. 

## 2022-03-23 NOTE — Progress Notes (Signed)
BP 117/70   Pulse 61   Temp 98.5 F (36.9 C) (Oral)   Ht '5\' 1"'$  (1.549 m)   Wt 216 lb 3.2 oz (98.1 kg)   SpO2 98%   BMI 40.85 kg/m    Subjective:    Patient ID: Bland Span, female    DOB: June 25, 1950, 71 y.o.   MRN: [id number]  HPI: Cindy [name] is a 71 y.o. female  Chief Complaint  Patient presents with   Diabetes    Eye exam at [facility] per patient, will call for report   Hyperlipidemia   Hypertension   Depression   DIABETES A1c 6.9% in April.  Taking Jardiance and Ozempic.  Continues with CCM for cost assist with Jardiance and Eliquis.   Hypoglycemic episodes:no Polydipsia/polyuria: no Visual disturbance: no Chest pain: no Paresthesias: no Glucose Monitoring: yes             Accucheck frequency: Daily             Fasting glucose: 116 to 130             Post prandial:             Evening:             Before meals: Taking Insulin?: no             Long acting insulin:             Short acting insulin: Blood Pressure Monitoring: a few times a week Retinal Examination: Up to Date -- [facility] -- saw this year Foot Exam: Up to Date  Pneumovax: Up To Date -- will provide #2 today Influenza: Not up to Date - refuses Aspirin: no    HYPERTENSION / HYPERLIPIDEMIA/A-FIB Followed by cardiology, last 11/18/21 -- had recent breathing testing, but has not heard about results.  Did see pulmonary on 01/13/21, but has returned. Continues on Eliquis (gets via assistance), Atorvastatin, Diltiazem, Lasix, Olmesartan, Bisoprolol, Spironolactone, and Minoxidil. Uses CPAP every night.   Last echo 11/26/20 with EF 60%.  Aortic atherosclerosis noted on 07/15/20 imaging.   Satisfied with current treatment? yes Duration of hypertension: chronic BP monitoring frequency: a few times a week BP range: 120-130/60-70 BP medication side effects: no Duration of hyperlipidemia: chronic Cholesterol medication side effects: no Cholesterol supplements: none Medication compliance:  good compliance Aspirin: no Recent stressors: no Recurrent headaches: no Visual changes: no Palpitations: no Dyspnea: ongoing with activity Chest pain: no Lower extremity edema: no Dizzy/lightheaded: occasional if changes positions too fast  CHRONIC KIDNEY DISEASE Labs on 12/10/21 with nephrology = CRT 1.78 GFR 30, PTH 62 H/H 11.5/35.6. CKD status: controlled Medications renally dose:  discussed with patient Previous renal evaluation: no Pneumovax:  Up To Date Influenza Vaccine:  Not up to Date --refuses  DEPRESSION Continues on Duloxetine and Buspar daily.  Overall reports stable mood. Mood status: stable Satisfied with current treatment?: yes Symptom severity: moderate  Duration of current treatment : chronic Side effects: no Medication compliance: good compliance Psychotherapy/counseling: none Depressed mood: occasional Anxious mood: no Anhedonia: no Significant weight loss or gain: no Insomnia: none Fatigue: no Feelings of worthlessness or guilt: no Impaired concentration/indecisiveness: no Suicidal ideations: no Hopelessness: no Crying spells: no    03/23/2022   10:06 AM 01/20/2022   11:43 AM 12/21/2021   10:21 AM 09/21/2021   10:18 AM 06/23/2021   11:34 AM  Depression screen PHQ 2/9  Decreased Interest 1 0 1 2 3  Down, Depressed, Hopeless '1 1 2 2 2  '$ PHQ - 2 Score '2 1 3 4 5  '$ Altered sleeping '1 2 2 '$ 0 0  Tired, decreased energy '1 2 1 2 3  '$ Change in appetite 0 0 0 1 1  Feeling bad or failure about yourself  0 0 0 0 3  Trouble concentrating 1 0 '1 2 3  '$ Moving slowly or fidgety/restless 1 0 0 1 2  Suicidal thoughts 0 0 0 0 0  PHQ-9 Score '6 5 7 10 17  '$ Difficult doing work/chores Not difficult at all Not difficult at all Not difficult at all  Somewhat difficult       03/23/2022   10:07 AM 12/21/2021   10:21 AM 09/21/2021   10:18 AM 06/23/2021   11:34 AM  GAD 7 : Generalized Anxiety Score  Nervous, Anxious, on Edge '1 1 2 3  '$ Control/stop worrying '1 1 2 3  '$ Worry  too much - different things '1 1 2 3  '$ Trouble relaxing 1 0 1 3  Restless 0 0 1 3  Easily annoyed or irritable 0 '1 1 3  '$ Afraid - awful might happen 0 0 1 3  Total GAD 7 Score '4 4 10 21  '$ Anxiety Difficulty Not difficult at all Not difficult at all Somewhat difficult    Relevant past medical, surgical, family and social history reviewed and updated as indicated. Interim medical history since our last visit reviewed. Allergies and medications reviewed and updated.  Review of Systems  Constitutional:  Negative for activity change, appetite change, diaphoresis, fatigue and fever.  Respiratory:  Positive for shortness of breath. Negative for cough, chest tightness and wheezing.   Cardiovascular:  Negative for chest pain, palpitations and leg swelling.  Gastrointestinal: Negative.   Endocrine: Negative for cold intolerance, heat intolerance, polydipsia, polyphagia and polyuria.  Skin:  Positive for wound.  Neurological: Negative.   Psychiatric/Behavioral: Negative.      Per HPI unless specifically indicated above     Objective:    BP 117/70   Pulse 61   Temp 98.5 F (36.9 C) (Oral)   Ht '5\' 1"'$  (1.549 m)   Wt 216 lb 3.2 oz (98.1 kg)   SpO2 98%   BMI 40.85 kg/m   Wt Readings from Last 3 Encounters:  03/23/22 216 lb 3.2 oz (98.1 kg)  12/21/21 218 lb 3.2 oz (99 kg)  09/21/21 220 lb 9.6 oz (100.1 kg)    Physical Exam Vitals and nursing note reviewed. Exam conducted with a chaperone present.  Constitutional:      General: She is awake. She is not in acute distress.    Appearance: She is well-developed and well-groomed. She is morbidly obese. She is not ill-appearing or toxic-appearing.  HENT:     Head: Normocephalic.     Right Ear: Hearing normal.     Left Ear: Hearing normal.  Eyes:     General: Lids are normal.        Right eye: No discharge.        Left eye: No discharge.     Conjunctiva/sclera: Conjunctivae normal.     Pupils: Pupils are equal, round, and reactive to light.   Neck:     Thyroid: No thyromegaly.     Vascular: No carotid bruit.  Cardiovascular:     Rate and Rhythm: Normal rate and regular rhythm.     Heart sounds: Murmur heard.     Systolic murmur is present with a grade of 2/6.  No gallop.  Pulmonary:     Effort: Pulmonary effort is normal. No accessory muscle usage or respiratory distress.     Breath sounds: No decreased breath sounds or wheezing.  Abdominal:     General: Bowel sounds are normal. There is no distension.     Palpations: Abdomen is soft.  Genitourinary:    Exam position: Lithotomy position.     Labia:        Right: No rash.        Left: No rash.   Musculoskeletal:     Cervical back: Normal range of motion and neck supple.     Right lower leg: No edema.     Left lower leg: No edema.  Lymphadenopathy:     Cervical: No cervical adenopathy.  Skin:    General: Skin is warm and dry.  Neurological:     Mental Status: She is alert and oriented to person, place, and time.     Deep Tendon Reflexes: Reflexes are normal and symmetric.     Reflex Scores:      Brachioradialis reflexes are 2+ on the right side and 2+ on the left side.      Patellar reflexes are 2+ on the right side and 2+ on the left side. Psychiatric:        Attention and Perception: Attention normal.        Mood and Affect: Mood normal.        Speech: Speech normal.        Behavior: Behavior normal. Behavior is cooperative.        Thought Content: Thought content normal.    Diabetic Foot Exam - Simple   No data filed     Results for orders placed or performed in visit on 12/24/21  Pulmonary Function Test [facility] Only  Result Value Ref Range   FVC-Pre 1.63 L   FVC-%Pred-Pre 62 %   FVC-Post 1.53 L   FVC-%Pred-Post 58 %   FVC-%Change-Post -6 %   FEV1-Pre 1.39 L   FEV1-%Pred-Pre 70 %   FEV1-Post 1.41 L   FEV1-%Pred-Post 72 %   FEV1-%Change-Post 1 %   FEV6-Pre 1.63 L   FEV6-%Pred-Pre 65 %   FEV6-Post 1.53 L   FEV6-%Pred-Post 61 %    FEV6-%Change-Post -6 %   Pre FEV1/FVC ratio 85 %   FEV1FVC-%Pred-Pre 112 %   Post FEV1/FVC ratio 92 %   FEV1FVC-%Change-Post 8 %   Pre FEV6/FVC Ratio 100 %   FEV6FVC-%Pred-Pre 104 %   Post FEV6/FVC ratio 100 %   FEV6FVC-%Pred-Post 104 %   FEF 25-75 Pre 1.99 L/sec   FEF2575-%Pred-Pre 117 %   FEF 25-75 Post 1.66 L/sec   FEF2575-%Pred-Post 98 %   FEF2575-%Change-Post -16 %   RV 2.01 L   RV % pred 97 %   TLC 3.78 L   TLC % pred 81 %   DLCO unc 13.03 ml/min/mmHg   DLCO unc % pred 74 %   DL/VA 4.57 ml/min/mmHg/L   DL/VA % pred 108 %      Assessment & Plan:   Problem List Items Addressed This Visit       Cardiovascular and Mediastinum   Aortic atherosclerosis ([place])    Ongoing.  Noted on 07/15/20 imaging.  At this time continue statin and Eliquis daily for prevention.        Relevant Medications   atorvastatin (LIPITOR) 40 MG tablet   bisoprolol (ZEBETA) 10 MG tablet   furosemide (LASIX) 80 MG tablet  minoxidil (LONITEN) 10 MG tablet   olmesartan (BENICAR) 40 MG tablet   spironolactone (ALDACTONE) 25 MG tablet   Hypertension associated with diabetes (HCC)    Chronic, stable.  BP at goal in office and at home.  At this time will continue current regimen and adjust as needed based on BP and HR.  Continue collaboration with cardiology.  Recommend she monitor BP at least a few mornings a week at home and document.  DASH diet at home.  Labs: BMP.  Return in 3 months.       Relevant Medications   atorvastatin (LIPITOR) 40 MG tablet   bisoprolol (ZEBETA) 10 MG tablet   furosemide (LASIX) 80 MG tablet   minoxidil (LONITEN) 10 MG tablet   olmesartan (BENICAR) 40 MG tablet   spironolactone (ALDACTONE) 25 MG tablet   Other Relevant Orders   Bayer DCA Hb A1c Waived   Lipid Panel w/o Chol/HDL Ratio   Basic metabolic panel   Mitral regurgitation    Mild mitral and tricuspid regurgitation noted on echo June 2022.  Continue collaboration with cardiology and current medication  regimen.      Relevant Medications   atorvastatin (LIPITOR) 40 MG tablet   bisoprolol (ZEBETA) 10 MG tablet   furosemide (LASIX) 80 MG tablet   minoxidil (LONITEN) 10 MG tablet   olmesartan (BENICAR) 40 MG tablet   spironolactone (ALDACTONE) 25 MG tablet   Paroxysmal atrial fibrillation (HCC)    Chronic, ongoing, followed by cardiology.  Continue Eliquis and Metoprolol + collaboration with cardiology team.  Rate controlled.  Continue to collaborate with CCM team.      Relevant Medications   atorvastatin (LIPITOR) 40 MG tablet   bisoprolol (ZEBETA) 10 MG tablet   furosemide (LASIX) 80 MG tablet   minoxidil (LONITEN) 10 MG tablet   olmesartan (BENICAR) 40 MG tablet   spironolactone (ALDACTONE) 25 MG tablet   Other Relevant Orders   Lipid Panel w/o Chol/HDL Ratio   Basic metabolic panel     Respiratory   OSA (obstructive sleep apnea)    Chronic, ongoing.  Continue 100% adherence to CPAP use at night.        Endocrine   CKD stage 4 due to type 2 diabetes mellitus (HCC)    Chronic, ongoing with recent labs stable with nephrology.  At this time reduce and renal dose medications as needed + continue collaboration with nephrology.  Labs: BMP today.  Avoid NSAIDs.      Relevant Medications   atorvastatin (LIPITOR) 40 MG tablet   olmesartan (BENICAR) 40 MG tablet   Other Relevant Orders   Bayer DCA Hb A1c Waived   Basic metabolic panel   Hyperlipidemia associated with type 2 diabetes mellitus (HCC)    Chronic, ongoing.  Continue current medication regimen and adjust as needed.  Lipid panel in office today.       Relevant Medications   atorvastatin (LIPITOR) 40 MG tablet   bisoprolol (ZEBETA) 10 MG tablet   furosemide (LASIX) 80 MG tablet   minoxidil (LONITEN) 10 MG tablet   olmesartan (BENICAR) 40 MG tablet   spironolactone (ALDACTONE) 25 MG tablet   Other Relevant Orders   Bayer DCA Hb A1c Waived   Lipid Panel w/o Chol/HDL Ratio   Primary hyperparathyroidism (HCC)     Chronic, ongoing.  Continue collaboration with nephrology and current medication regimen. Recent labs performed by them and stable.      Type 2 diabetes mellitus with morbid obesity ([place]) - Primary  Chronic, ongoing with A1c 6.1% today, downward trend and urine PRT/CRT ratio 16 August 2021.  Praised for ongoing success. - Continue Jardiance 25 MG and Ozempic 0.5 MG weekly at this time, as seeing significant benefit with this.  Gets via CCM assistance. - Maintain off Glimepiride due to her age and risk for hypoglycemia. Plus maintain off Metformin due to kidney health. - If poor control once at MAX doses of GLP1 and SGLT2, then will consider referral to endo, but at this time will continue to work on this in office which patient agrees with. - Check BS TID and focus on diabetic diet.    - Return in 3 months for A1c -- but alert provider prior to if elevations in BS >300 or <70.        Relevant Medications   atorvastatin (LIPITOR) 40 MG tablet   olmesartan (BENICAR) 40 MG tablet   Other Relevant Orders   Bayer DCA Hb A1c Waived   Basic metabolic panel     Genitourinary   Anemia in chronic kidney disease (CKD)    Ongoing.  Labs up to date with nephrology and stable.  Continue this collaboration, recent notes and labs reviewed.        Hematopoietic and Hemostatic   Other thrombophilia ([place])    Related to a-fib and long term Eliquis.  Monitor CBC regularly and monitor skin for bruising or breakdown, notify provider if present.        Other   Depression, major, single episode, moderate (HCC)    Chronic, stable.   Denies SI/HI.  Continue Duloxetine at this time, which also offers benefit to chronic pain and mood, + continue Buspar which has offered her benefit to anxiety.  Refills as needed.  Will adjust dosing or add medication as needed.  Return in 3 months.      Relevant Medications   busPIRone (BUSPAR) 5 MG tablet   Morbid obesity (HCC)    BMI 41.85 with T2DM, HTN, CKD with some  more loss noted, praised for this.  Recommended eating smaller high protein, low fat meals more frequently and exercising 30 mins a day 5 times a week with a goal of 10-15lb weight loss in the next 3 months. Patient voiced their understanding and motivation to adhere to these recommendations.         Follow up plan: No follow-ups on file.

## 2022-03-23 NOTE — Assessment & Plan Note (Signed)
Chronic, ongoing.  Continue 100% adherence to CPAP use at night.

## 2022-03-23 NOTE — Assessment & Plan Note (Signed)
Chronic, ongoing with A1c 6.1% today, downward trend and urine PRT/CRT ratio 16 August 2021.  Praised for ongoing success. - Continue Jardiance 25 MG and Ozempic 0.5 MG weekly at this time, as seeing significant benefit with this.  Gets via CCM assistance. - Maintain off Glimepiride due to her age and risk for hypoglycemia. Plus maintain off Metformin due to kidney health. - If poor control once at MAX doses of GLP1 and SGLT2, then will consider referral to endo, but at this time will continue to work on this in office which patient agrees with. - Check BS TID and focus on diabetic diet.    - Return in 3 months for A1c -- but alert provider prior to if elevations in BS >300 or <70.

## 2022-03-23 NOTE — Assessment & Plan Note (Signed)
Ongoing.  Labs up to date with nephrology and stable.  Continue this collaboration, recent notes and labs reviewed. 

## 2022-03-23 NOTE — Assessment & Plan Note (Signed)
Mild mitral and tricuspid regurgitation noted on echo June 2022.  Continue collaboration with cardiology and current medication regimen. 

## 2022-03-23 NOTE — Assessment & Plan Note (Signed)
Related to a-fib and long term Eliquis.  Monitor CBC regularly and monitor skin for bruising or breakdown, notify provider if present. 

## 2022-03-23 NOTE — Assessment & Plan Note (Signed)
BMI 41.85 with T2DM, HTN, CKD with some more loss noted, praised for this.  Recommended eating smaller high protein, low fat meals more frequently and exercising 30 mins a day 5 times a week with a goal of 10-15lb weight loss in the next 3 months. Patient voiced their understanding and motivation to adhere to these recommendations.

## 2022-03-23 NOTE — Assessment & Plan Note (Signed)
Chronic, ongoing.  Continue current medication regimen and adjust as needed.  Lipid panel in office today.  

## 2022-03-23 NOTE — Assessment & Plan Note (Signed)
Chronic, stable.  BP at goal in office and at home.  At this time will continue current regimen and adjust as needed based on BP and HR.  Continue collaboration with cardiology.  Recommend she monitor BP at least a few mornings a week at home and document.  DASH diet at home.  Labs: BMP.  Return in 3 months.

## 2022-03-23 NOTE — Assessment & Plan Note (Addendum)
Chronic, stable.   Denies SI/HI.  Continue Duloxetine at this time, which also offers benefit to chronic pain and mood, + continue Buspar which has offered her benefit to anxiety.  Refills as needed.  Will adjust dosing or add medication as needed.  Return in 3 months. 

## 2022-03-23 NOTE — Assessment & Plan Note (Signed)
Chronic, ongoing, followed by cardiology.  Continue Eliquis and Metoprolol + collaboration with cardiology team.  Rate controlled.  Continue to collaborate with CCM team.

## 2022-03-23 NOTE — Assessment & Plan Note (Signed)
Ongoing.  Noted on 07/15/20 imaging.  At this time continue statin and Eliquis daily for prevention.   

## 2022-03-23 NOTE — Assessment & Plan Note (Signed)
Chronic, ongoing with recent labs stable with nephrology.  At this time reduce and renal dose medications as needed + continue collaboration with nephrology.  Labs: BMP today.  Avoid NSAIDs.

## 2022-03-24 ENCOUNTER — Ambulatory Visit
Admission: RE | Admit: 2022-03-24 | Discharge: 2022-03-24 | Disposition: A | Discharge status: Home / Self Care | Payer: Medicare HMO | Source: Ambulatory Visit | Attending: Nurse Practitioner | Admitting: Nurse Practitioner

## 2022-03-24 DIAGNOSIS — N6489 Other specified disorders of breast: Secondary | ICD-10-CM | POA: Diagnosis not present

## 2022-03-24 DIAGNOSIS — R928 Other abnormal and inconclusive findings on diagnostic imaging of breast: Secondary | ICD-10-CM | POA: Insufficient documentation

## 2022-03-24 LAB — LIPID PANEL W/O CHOL/HDL RATIO
Cholesterol, Total: 144 mg/dL (ref 100–199)
HDL: 48 mg/dL (ref >39)
LDL Chol Calc (NIH): 80 mg/dL (ref 0–99)
Triglycerides: 82 mg/dL (ref 0–149)
VLDL Cholesterol Cal: 16 mg/dL (ref 5–40)

## 2022-03-24 LAB — BASIC METABOLIC PANEL
BUN/Creatinine Ratio: 10 — ABNORMAL LOW (ref 12–28)
BUN: 15 mg/dL (ref 8–27)
CO2: 23 mmol/L (ref 20–29)
Calcium: 9.6 mg/dL (ref 8.7–10.3)
Chloride: 102 mmol/L (ref 96–106)
Creatinine, Ser: 1.56 mg/dL — ABNORMAL HIGH (ref 0.57–1.00)
Glucose: 113 mg/dL — ABNORMAL HIGH (ref 70–99)
Potassium: 3.6 mmol/L (ref 3.5–5.2)
Sodium: 143 mmol/L (ref 134–144)
eGFR: 35 mL/min/{1.73_m2} — ABNORMAL LOW (ref >59)

## 2022-03-24 NOTE — Progress Notes (Signed)
Good afternoon, please let Cindy [name] know her mammogram returned normal.  Labs show stable kidney function, continue kidney disease with no worsening.  Continue to visit with kidney doctor.  Cholesterol levels stable.  Continue all current medications!!  Great job!! Keep being awesome!!  Thank you for allowing me to participate in your care.  I appreciate you. Kindest regards, Trevor Wilkie

## 2022-03-25 ENCOUNTER — Other Ambulatory Visit: Payer: Self-pay | Admitting: Nurse Practitioner

## 2022-03-25 DIAGNOSIS — E210 Primary hyperparathyroidism: Secondary | ICD-10-CM

## 2022-03-25 DIAGNOSIS — E1169 Type 2 diabetes mellitus with other specified complication: Secondary | ICD-10-CM

## 2022-03-25 DIAGNOSIS — I152 Hypertension secondary to endocrine disorders: Secondary | ICD-10-CM

## 2022-03-25 DIAGNOSIS — E1122 Type 2 diabetes mellitus with diabetic chronic kidney disease: Secondary | ICD-10-CM

## 2022-03-25 DIAGNOSIS — I480 Paroxysmal atrial fibrillation: Secondary | ICD-10-CM

## 2022-04-05 ENCOUNTER — Emergency Department
Admission: EM | Admit: 2022-04-05 | Discharge: 2022-04-05 | Disposition: A | Discharge status: Home / Self Care | Payer: Medicare HMO | Attending: Emergency Medicine | Admitting: Emergency Medicine

## 2022-04-05 ENCOUNTER — Encounter: Payer: Self-pay | Admitting: Intensive Care

## 2022-04-05 ENCOUNTER — Other Ambulatory Visit: Payer: Self-pay

## 2022-04-05 DIAGNOSIS — R197 Diarrhea, unspecified: Secondary | ICD-10-CM

## 2022-04-05 LAB — URINALYSIS, ROUTINE W REFLEX MICROSCOPIC
Bacteria, UA: NONE SEEN
Bilirubin Urine: NEGATIVE
Glucose, UA: >=500 mg/dL — AB
Hgb urine dipstick: NEGATIVE
Ketones, ur: NEGATIVE mg/dL
Nitrite: NEGATIVE
Protein, ur: NEGATIVE mg/dL
Specific Gravity, Urine: 1.016 (ref 1.005–1.030)
pH: 5 (ref 5.0–8.0)

## 2022-04-05 LAB — COMPREHENSIVE METABOLIC PANEL
ALT: 12 U/L (ref 0–44)
AST: 16 U/L (ref 15–41)
Albumin: 4.5 g/dL (ref 3.5–5.0)
Alkaline Phosphatase: 68 U/L (ref 38–126)
Anion gap: 8 (ref 5–15)
BUN: 20 mg/dL (ref 8–23)
CO2: 29 mmol/L (ref 22–32)
Calcium: 9.5 mg/dL (ref 8.9–10.3)
Chloride: 106 mmol/L (ref 98–111)
Creatinine, Ser: 1.66 mg/dL — ABNORMAL HIGH (ref 0.44–1.00)
GFR, Estimated: 33 mL/min — ABNORMAL LOW (ref >60)
Glucose, Bld: 123 mg/dL — ABNORMAL HIGH (ref 70–99)
Potassium: 3.6 mmol/L (ref 3.5–5.1)
Sodium: 143 mmol/L (ref 135–145)
Total Bilirubin: 0.8 mg/dL (ref 0.3–1.2)
Total Protein: 7.7 g/dL (ref 6.5–8.1)

## 2022-04-05 LAB — CBC
HCT: 34.2 % — ABNORMAL LOW (ref 36.0–46.0)
Hemoglobin: 10.7 g/dL — ABNORMAL LOW (ref 12.0–15.0)
MCH: 28.3 pg (ref 26.0–34.0)
MCHC: 31.3 g/dL (ref 30.0–36.0)
MCV: 90.5 fL (ref 80.0–100.0)
Platelets: 234 10*3/uL (ref 150–400)
RBC: 3.78 MIL/uL — ABNORMAL LOW (ref 3.87–5.11)
RDW: 13.9 % (ref 11.5–15.5)
WBC: 5.4 10*3/uL (ref 4.0–10.5)
nRBC: 0 % (ref 0.0–0.2)

## 2022-04-05 LAB — LIPASE, BLOOD: Lipase: 39 U/L (ref 11–51)

## 2022-04-05 MED ORDER — SODIUM CHLORIDE 0.9 % IV BOLUS
1000.0000 mL | Freq: Once | INTRAVENOUS | Status: AC
  Administered 2022-04-05: 1000 mL via INTRAVENOUS

## 2022-04-05 NOTE — ED Triage Notes (Signed)
Patient c/o diarrhea since Friday. Reports she has no warning and it just comes out. Reports she feels dehydrated and has not been eating or drinking much.   Reports she takes the weight loss shot once a week that her GI doctor believes could be causing her diarrhea

## 2022-04-05 NOTE — ED Notes (Signed)
Pt IV placed in 20 in R AC. Pt tolerated well. Pt fluids running currently with no signs of any issues.

## 2022-04-05 NOTE — Discharge Instructions (Signed)
You were seen in the emergency department for diarrhea.  Your electrolytes were normal.  You were given IV fluids in the emergency department.  You were unable to have a bowel movement while in the emergency department.  Is important that he stay hydrated and drink plenty of fluids.  Return to the emergency department if there is any worsening symptoms.  If you are able to collect a stool sample at home you can take it to your primary care physician office to an outpatient lab.  Call your primary care physician to get instructions.

## 2022-04-05 NOTE — ED Provider Notes (Signed)
[facility] Provider Note    Event Date/Time   First MD Initiated Contact with Patient 04/05/22 [phone]     (approximate)   History   Diarrhea   HPI  Cindy [name] is a 71 y.o. female who presents to the emergency department diarrhea.  Endorses a 3-day history of watery diarrhea that has been nonbloody.  Denies any nausea or vomiting.  Denies any abdominal pain.  No fever or chills.  No recent alcohol use.  No known sick contacts.  No recent trips or travel.  No new medications.  No unexplained weight gain or weight loss.  Given to the emergency department because she is concerned she may be dehydrated.     Physical Exam   Triage Vital Signs: ED Triage Vitals [04/05/22 0901]  Enc Vitals Group     BP      Pulse      Resp      Temp      Temp src      SpO2      Weight 216 lb (98 kg)     Height '5\' 1"'$  (1.549 m)     Head Circumference      Peak Flow      Pain Score 0     Pain Loc      Pain Edu?      Excl. in [place]?     Most recent vital signs: Vitals:   04/05/22 1030 04/05/22 1045  BP: (!) 146/63   Pulse: 62 (!) 54  SpO2: 100% 100%    Physical Exam Constitutional:      Appearance: She is well-developed.  HENT:     Head: Atraumatic.  Eyes:     Conjunctiva/sclera: Conjunctivae normal.  Cardiovascular:     Rate and Rhythm: Regular rhythm.  Pulmonary:     Effort: No respiratory distress.  Abdominal:     General: There is no distension.  Musculoskeletal:        General: Normal range of motion.     Cervical back: Normal range of motion.  Skin:    General: Skin is warm.  Neurological:     Mental Status: She is alert. Mental status is at baseline.          IMPRESSION / MDM / ASSESSMENT AND PLAN / ED COURSE  I reviewed the triage vital signs and the nursing notes.  Differential diagnosis including infectious diarrhea, C. difficile, electrolyte abnormality, dehydration, medication side effect  Patient with no abdominal pain or  tenderness to palpation, low suspicion for acute diverticulitis.  Low suspicion for intra-abdominal abscess.  Patient given IV fluid  EKG    RADIOLOGY       ED Results / Procedures / Treatments   Labs (all labs ordered are listed, but only abnormal results are displayed) Labs interpreted as -   On reevaluation patient continues to have a benign abdominal exam.  Do not feel that CT imaging is necessary at this time.  UA with leukocyte esterase but no symptoms of urinary tract infection.  Will not treat for UTI at this time.  Tolerating p.o.  Discussed no antimotility agents until she can follow-up and provide a stool sample with primary care provider.  Have a low suspicion for C. difficile.  Labs Reviewed  COMPREHENSIVE METABOLIC PANEL - Abnormal; Notable for the following components:      Result Value   Glucose, Bld 123 (*)    Creatinine, Ser 1.66 (*)    GFR,  Estimated 33 (*)    All other components within normal limits  CBC - Abnormal; Notable for the following components:   RBC 3.78 (*)    Hemoglobin 10.7 (*)    HCT 34.2 (*)    All other components within normal limits  URINALYSIS, ROUTINE W REFLEX MICROSCOPIC - Abnormal; Notable for the following components:   Color, Urine YELLOW (*)    APPearance CLEAR (*)    Glucose, UA >=500 (*)    Leukocytes,Ua TRACE (*)    All other components within normal limits  C DIFFICILE QUICK SCREEN W PCR REFLEX    LIPASE, BLOOD       PROCEDURES:  Critical Care performed: No  Procedures  Patient's presentation is most consistent with acute presentation with potential threat to life or bodily function.   MEDICATIONS ORDERED IN ED: Medications  sodium chloride 0.9 % bolus 1,000 mL (1,000 mLs Intravenous New Bag/Given 04/05/22 1043)    FINAL CLINICAL IMPRESSION(S) / ED DIAGNOSES   Final diagnoses:  Diarrhea, unspecified type     Rx / DC Orders   ED Discharge Orders     None        Note:  This document was  prepared using Dragon voice recognition software and may include unintentional dictation errors.   Nathaniel Man, MD 04/05/22 1132

## 2022-04-05 NOTE — ED Notes (Signed)
Pt A&Ox4. Pt able to ambulate to the bathroom with a steady gait to provide urine sample. Pt usually ambulates with a walker or cane at home. Pt states the diarrhea has been going on since Friday night. Pt states she also gives herself weekly shots of Ozempic. Has been limited in drinking and eating the past few days.

## 2022-04-13 DIAGNOSIS — N2581 Secondary hyperparathyroidism of renal origin: Secondary | ICD-10-CM | POA: Diagnosis not present

## 2022-04-13 DIAGNOSIS — I10 Essential (primary) hypertension: Secondary | ICD-10-CM | POA: Diagnosis not present

## 2022-04-13 DIAGNOSIS — D631 Anemia in chronic kidney disease: Secondary | ICD-10-CM | POA: Diagnosis not present

## 2022-04-13 DIAGNOSIS — N1832 Chronic kidney disease, stage 3b: Secondary | ICD-10-CM | POA: Diagnosis not present

## 2022-04-13 DIAGNOSIS — E1122 Type 2 diabetes mellitus with diabetic chronic kidney disease: Secondary | ICD-10-CM | POA: Diagnosis not present

## 2022-04-28 ENCOUNTER — Telehealth: Payer: Self-pay

## 2022-04-28 NOTE — Progress Notes (Signed)
    Chronic Care Management Pharmacy Assistant   Name: Cindy [name]  MRN: [id number] DOB: 16-Jun-1950  Patient assistance application for Eliquis, Jardiance, and Ozempic has been completed on behalf of the patient. Spoke with patient and made her aware she will need to provide requested income verification to be sent the manufacturer.  Patient stated that she does not get her Social Security statement until January. I will mail application to patient at the end of December. Patient states that she believes she has enough on hand until then.   [place] [phone]

## 2022-05-01 ENCOUNTER — Other Ambulatory Visit: Payer: Self-pay | Admitting: Nurse Practitioner

## 2022-05-02 ENCOUNTER — Other Ambulatory Visit: Payer: Self-pay | Admitting: Nurse Practitioner

## 2022-05-03 NOTE — Telephone Encounter (Addendum)
[place] called and spoke to [place], [facility] about the request for Olmesartan 20 mg. Advised at the [facility] on 03/23/22 Olmesartan 40 mg was prescribed, so the refill for the Olmesartan 20 mg needs to be cancelled that was sent in today. He said he will cancel and notify the patient she's supposed to be on the 40 mg. Will refuse this request. Patient's chart will needed updated to reflect the correct dosage and remove the old dosage on the medication list.   Requested Prescriptions  Pending Prescriptions Disp Refills   olmesartan (BENICAR) 20 MG tablet [Pharmacy Med Name: OLMESARTAN MEDOXOMIL '20MG'$  TABLETS] 90 tablet     Sig: TAKE ONE TABLET BY MOUTH ONCE DAILY.     Cardiovascular:  Angiotensin Receptor Blockers Failed - 05/02/2022  4:57 PM      Failed - Cr in normal range and within 180 days    Creatinine  Date Value Ref Range Status  01/24/2014 0.86 0.60 - 1.30 mg/dL Final   Creatinine, Ser  Date Value Ref Range Status  04/05/2022 1.66 (H) 0.44 - 1.00 mg/dL Final         Failed - Last BP in normal range    BP Readings from Last 1 Encounters:  04/05/22 (!) 155/64         Passed - K in normal range and within 180 days    Potassium  Date Value Ref Range Status  04/05/2022 3.6 3.5 - 5.1 mmol/L Final  01/24/2014 3.4 (L) 3.5 - 5.1 mmol/L Final         Passed - Patient is not pregnant      Passed - Valid encounter within last 6 months    Recent Outpatient Visits           1 month ago Type 2 diabetes mellitus with morbid obesity ([place])   [place], Jolene T, NP   4 months ago Type 2 diabetes mellitus with morbid obesity ([place])   [place], Jolene T, NP   7 months ago Type 2 diabetes mellitus with morbid obesity ([place])   [place], Jolene T, NP   9 months ago Type 2 diabetes mellitus with morbid obesity ([place])   [place] Cannady, Jolene T, NP   10 months ago Type 2 diabetes mellitus with morbid  obesity ([place])   [place], Barbaraann Faster, NP       Future Appointments             In 1 month Cannady, Barbaraann Faster, NP [facility]

## 2022-05-03 NOTE — Telephone Encounter (Signed)
Requested Prescriptions  Pending Prescriptions Disp Refills   olmesartan (BENICAR) 20 MG tablet [Pharmacy Med Name: OLMESARTAN MEDOXOMIL '20MG'$  TABLETS] 90 tablet 0    Sig: TAKE ONE TABLET BY MOUTH ONCE DAILY.     Cardiovascular:  Angiotensin Receptor Blockers Failed - 05/01/2022  4:06 PM      Failed - Cr in normal range and within 180 days    Creatinine  Date Value Ref Range Status  01/24/2014 0.86 0.60 - 1.30 mg/dL Final   Creatinine, Ser  Date Value Ref Range Status  04/05/2022 1.66 (H) 0.44 - 1.00 mg/dL Final         Failed - Last BP in normal range    BP Readings from Last 1 Encounters:  04/05/22 (!) 155/64         Passed - K in normal range and within 180 days    Potassium  Date Value Ref Range Status  04/05/2022 3.6 3.5 - 5.1 mmol/L Final  01/24/2014 3.4 (L) 3.5 - 5.1 mmol/L Final         Passed - Patient is not pregnant      Passed - Valid encounter within last 6 months    Recent Outpatient Visits           1 month ago Type 2 diabetes mellitus with morbid obesity ([place])   [place], Jolene T, NP   4 months ago Type 2 diabetes mellitus with morbid obesity ([place])   [place], Jolene T, NP   7 months ago Type 2 diabetes mellitus with morbid obesity ([place])   [place], Jolene T, NP   9 months ago Type 2 diabetes mellitus with morbid obesity ([place])   [place] Cannady, Jolene T, NP   10 months ago Type 2 diabetes mellitus with morbid obesity ([place])   [place], Barbaraann Faster, NP       Future Appointments             In 1 month Cannady, Barbaraann Faster, NP [facility]

## 2022-05-07 ENCOUNTER — Other Ambulatory Visit: Payer: Self-pay | Admitting: Nurse Practitioner

## 2022-05-07 MED ORDER — OLMESARTAN MEDOXOMIL 20 MG PO TABS: 20.0000 mg | ORAL_TABLET | Freq: Every day | ORAL | 0 refills | Status: DC

## 2022-05-07 NOTE — Telephone Encounter (Signed)
Called pt to notify her rx was sent to new pharmacy as requested. LVMTCB if had any additional questions.

## 2022-05-07 NOTE — Telephone Encounter (Signed)
Medication Refill - Medication: olmesartan (BENICAR) 20 MG tablet   Has the patient contacted their pharmacy? Yes.     Preferred Pharmacy (with phone number or street name):  [id number] - [place], [address] AT [address] Phone: [phone]  Fax: [phone]     Has the patient been seen for an appointment in the last year OR does the patient have an upcoming appointment? Yes.     The patient called in stating she uses a different pharmacy now and the prescription refill for her olmesartan (BENICAR) 20 MG tablet was sent into the old pharmacy which she no longer uses. She has a prescription she recently got for 40 mg but wasn't aware her provider wanted her to change mg. She is going to cut them in half for now so that they are the '20mg'$  until she hears back.Please send the new prescription for the 20 mg refill to the [facility] in [place] on [address]. The [facility] in [place] has been removed from the patients chart so no further confusion.

## 2022-05-07 NOTE — Telephone Encounter (Signed)
Sending to different pharmacy as pt doesn't use pharmacy rx was sent to.  Requested Prescriptions  Pending Prescriptions Disp Refills   olmesartan (BENICAR) 20 MG tablet 90 tablet 0    Sig: Take 1 tablet (20 mg total) by mouth daily.     Cardiovascular:  Angiotensin Receptor Blockers Failed - 05/07/2022  5:18 PM      Failed - Cr in normal range and within 180 days    Creatinine  Date Value Ref Range Status  01/24/2014 0.86 0.60 - 1.30 mg/dL Final   Creatinine, Ser  Date Value Ref Range Status  04/05/2022 1.66 (H) 0.44 - 1.00 mg/dL Final         Failed - Last BP in normal range    BP Readings from Last 1 Encounters:  04/05/22 (!) 155/64         Passed - K in normal range and within 180 days    Potassium  Date Value Ref Range Status  04/05/2022 3.6 3.5 - 5.1 mmol/L Final  01/24/2014 3.4 (L) 3.5 - 5.1 mmol/L Final         Passed - Patient is not pregnant      Passed - Valid encounter within last 6 months    Recent Outpatient Visits           1 month ago Type 2 diabetes mellitus with morbid obesity ([place])   [place], Jolene T, NP   4 months ago Type 2 diabetes mellitus with morbid obesity ([place])   [place], Jolene T, NP   7 months ago Type 2 diabetes mellitus with morbid obesity ([place])   [place], Jolene T, NP   9 months ago Type 2 diabetes mellitus with morbid obesity ([place])   [place] Cannady, Jolene T, NP   10 months ago Type 2 diabetes mellitus with morbid obesity ([place])   [place], Barbaraann Faster, NP       Future Appointments             In 1 month Cannady, Barbaraann Faster, NP [facility]

## 2022-05-12 ENCOUNTER — Telehealth: Payer: Medicare HMO

## 2022-05-12 ENCOUNTER — Ambulatory Visit (INDEPENDENT_AMBULATORY_CARE_PROVIDER_SITE_OTHER): Payer: Medicare HMO

## 2022-05-12 DIAGNOSIS — N184 Chronic kidney disease, stage 4 (severe): Secondary | ICD-10-CM

## 2022-05-12 DIAGNOSIS — G4733 Obstructive sleep apnea (adult) (pediatric): Secondary | ICD-10-CM | POA: Diagnosis not present

## 2022-05-12 DIAGNOSIS — E1169 Type 2 diabetes mellitus with other specified complication: Secondary | ICD-10-CM

## 2022-05-12 DIAGNOSIS — I480 Paroxysmal atrial fibrillation: Secondary | ICD-10-CM

## 2022-05-12 DIAGNOSIS — E1159 Type 2 diabetes mellitus with other circulatory complications: Secondary | ICD-10-CM | POA: Diagnosis not present

## 2022-05-12 DIAGNOSIS — I152 Hypertension secondary to endocrine disorders: Secondary | ICD-10-CM

## 2022-05-12 DIAGNOSIS — R942 Abnormal results of pulmonary function studies: Secondary | ICD-10-CM | POA: Diagnosis not present

## 2022-05-12 DIAGNOSIS — E1129 Type 2 diabetes mellitus with other diabetic kidney complication: Secondary | ICD-10-CM | POA: Diagnosis not present

## 2022-05-12 DIAGNOSIS — R0609 Other forms of dyspnea: Secondary | ICD-10-CM | POA: Diagnosis not present

## 2022-05-12 DIAGNOSIS — I340 Nonrheumatic mitral (valve) insufficiency: Secondary | ICD-10-CM | POA: Diagnosis not present

## 2022-05-12 DIAGNOSIS — E7800 Pure hypercholesterolemia, unspecified: Secondary | ICD-10-CM | POA: Diagnosis not present

## 2022-05-12 NOTE — Chronic Care Management (AMB) (Signed)
Chronic Care Management   CCM RN Visit Note  05/12/2022 Name: Cindy [name] MRN: [id number] DOB: 30-Jul-1950  Subjective: Cindy [name] is a 71 y.o. year old female who is a primary care patient of Cannady, Barbaraann Faster, NP. The patient was referred to the Chronic Care Management team for assistance with care management needs subsequent to provider initiation of CCM services and plan of care.    Today's Visit:  Engaged with patient by telephone for initial visit.     SDOH Interventions Today    Flowsheet Row Most Recent Value  SDOH Interventions   Food Insecurity Interventions Intervention Not Indicated  Housing Interventions Intervention Not Indicated  Transportation Interventions Intervention Not Indicated  Utilities Interventions Intervention Not Indicated  Alcohol Usage Interventions Intervention Not Indicated (Score <7)  Financial Strain Interventions Other (Comment), Intervention Not Indicated  [Pap for medications, works with pharm D]  Physical Activity Interventions Intervention Not Indicated, Other (Comments)  [encouraged activity]  Stress Interventions Intervention Not Indicated  Social Connections Interventions Intervention Not Indicated         Goals Addressed             This Visit's Progress    CCM Expected Outcome:  Monitor, Self-Manage and Reduce Symptoms of Afib       Current Barriers:  Chronic Disease Management support and education needs related to effective management of AFIB  Planned Interventions: Provider order and care plan reviewed. Collaborated with PharmD regarding patient care and plan. Counseled on increased risk of stroke due to Afib and benefits of anticoagulation for stroke prevention           Reviewed importance of adherence to anticoagulant exactly as prescribed Advised patient to discuss changes in her heart rate or AFIB with provider Counseled on bleeding risk associated with AFIB and importance of self-monitoring for signs/symptoms  of bleeding Counseled on avoidance of NSAIDs due to increased bleeding risk with anticoagulants Counseled on importance of regular laboratory monitoring as prescribed Counseled on seeking medical attention after a head injury or if there is blood in the urine/stool Afib action plan reviewed Screening for signs and symptoms of depression related to chronic disease state Assessed social determinant of health barriers  Symptom Management: Take medications as prescribed   Attend all scheduled provider appointments Call provider office for new concerns or questions  call the Suicide and Crisis Lifeline: 988 call the [place] National Suicide Prevention Lifeline: (850)120-6281 or TTY: (512) 486-4517 TTY 415-227-5378) to talk to a trained counselor call 1-800-273-TALK (toll free, 24 hour hotline) if experiencing a Mental Health or [place]  - make a plan to eat healthy - keep all lab appointments - take medicine as prescribed  Follow Up Plan: Telephone follow up appointment with care management team member scheduled for: 07-13-2022 at 1 pm       CCM Expected Outcome:  Monitor, Self-Manage and Reduce Symptoms of Diabetes       Current Barriers:  Chronic Disease Management support and education needs related to effective management of DM Financial Constraints.   Planned Interventions: Provided education to patient about basic DM disease process; Reviewed medications with patient and discussed importance of medication adherence;        Reviewed prescribed diet with patient heart healthy/ADA diet ; Counseled on importance of regular laboratory monitoring as prescribed;        Discussed plans with patient for ongoing care management follow up and provided patient with direct contact information for care management team;  Provided patient with written educational materials related to hypo and hyperglycemia and importance of correct treatment;       Reviewed scheduled/upcoming  provider appointments including: 06-23-2022 at 10 am;         Advised patient, providing education and rationale, to check cbg once daily and when you have symptoms of low or high blood sugar and record        Review of patient status, including review of consultants reports, relevant laboratory and other test results, and medications completed;       Advised patient to discuss changes in DM or questions or concerns with provider;      Screening for signs and symptoms of depression related to chronic disease state;        Assessed social determinant of health barriers;         Symptom Management: Take medications as prescribed   Attend all scheduled provider appointments Call provider office for new concerns or questions  call the Suicide and Crisis Lifeline: 988 call the [place] National Suicide Prevention Lifeline: (540)006-3857 or TTY: 743-075-8202 TTY 614 215 1365) to talk to a trained counselor call 1-800-273-TALK (toll free, 24 hour hotline) if experiencing a Mental Health or [place]  check feet daily for cuts, sores or redness trim toenails straight across manage portion size wash and dry feet carefully every day wear comfortable, cotton socks wear comfortable, well-fitting shoes  Follow Up Plan: Telephone follow up appointment with care management team member scheduled for: 07-13-2022 at 1 pm       CCM Expected Outcome:  Monitor, Self-Manage and Reduce Symptoms of: CKD       Current Barriers:  Knowledge Deficits related to importance of kidney protection and monitoring for dehydration or changes in conditions that may impact kidney function Chronic Disease Management support and education needs related to effective management of CKD EGFR at 30  Planned Interventions: Assessed the patient     understanding of chronic kidney disease    Evaluation of current treatment plan related to chronic kidney disease self management and patient's adherence to plan as  established by provider      Provided education to patient re: stroke prevention, s/s of heart attack and stroke    Reviewed prescribed diet heart healthy/ADA diet  Reviewed medications with patient and discussed importance of compliance    Advised patient, providing education and rationale, to monitor blood pressure daily and record, calling PCP for findings outside established parameters    Discussed complications of poorly controlled blood pressure such as heart disease, stroke, circulatory complications, vision complications, kidney impairment, sexual dysfunction    Reviewed scheduled/upcoming provider appointments including: 06-23-2022 at 10 am, saw nephrologist in November of 2023   Advised patient to discuss changes in her kidney function, questions or concerns with provider    Discussed plans with patient for ongoing care management follow up and provided patient with direct contact information for care management team    Screening for signs and symptoms of depression related to chronic disease state      Discussed the impact of chronic kidney disease on daily life and mental health and acknowledged and normalized feelings of disempowerment, fear, and frustration    Assessed social determinant of health barriers    Provided education on kidney disease progression    Engage patient in early, proactive and ongoing discussion about goals of care and what matters most to them    Support coping and stress management by recognizing current strategies and assist  in developing new strategies such as mindfulness, journaling, relaxation techniques, problem-solving     Symptom Management: Take medications as prescribed   Attend all scheduled provider appointments Call provider office for new concerns or questions  call the Suicide and Crisis Lifeline: 988 call the [place] National Suicide Prevention Lifeline: 415-776-3439 or TTY: 559-353-0801 TTY [phone]) to talk to a trained counselor call  1-800-273-TALK (toll free, 24 hour hotline) if experiencing a Mental Health or [place]   Follow Up Plan: Telephone follow up appointment with care management team member scheduled for: 07-13-2022 at 1 pm       CCM Expected Outcome:  Monitor, Self-Manage, and Reduce Symptoms of Hypertension       Current Barriers:  Chronic Disease Management support and education needs related to effective management of HTN  Planned Interventions: Evaluation of current treatment plan related to hypertension self management and patient's adherence to plan as established by provider;   Provided education to patient re: stroke prevention, s/s of heart attack and stroke; Reviewed prescribed diet heart healthy/ADA diet  Reviewed medications with patient and discussed importance of compliance;  Discussed plans with patient for ongoing care management follow up and provided patient with direct contact information for care management team; Advised patient, providing education and rationale, to monitor blood pressure daily and record, calling PCP for findings outside established parameters;  Reviewed scheduled/upcoming provider appointments including: 06-23-2022 at 10 am, saw cardiologist today and got a good report. No changes in the plan of care Advised patient to discuss changes in blood pressure or heart health with provider; Provided education on prescribed diet heart healthy/ADA diet ;  Discussed complications of poorly controlled blood pressure such as heart disease, stroke, circulatory complications, vision complications, kidney impairment, sexual dysfunction;  Screening for signs and symptoms of depression related to chronic disease state;  Assessed social determinant of health barriers;   Symptom Management: Take medications as prescribed   Attend all scheduled provider appointments Call provider office for new concerns or questions  call the Suicide and Crisis Lifeline: 988 call the [place]  National Suicide Prevention Lifeline: 3850573966 or TTY: 701-039-9753 TTY (309)875-9968) to talk to a trained counselor call 1-800-273-TALK (toll free, 24 hour hotline) if experiencing a Mental Health or [place]  check blood pressure weekly call doctor for signs and symptoms of high blood pressure develop an action plan for high blood pressure keep all doctor appointments take medications for blood pressure exactly as prescribed report new symptoms to your doctor  Follow Up Plan: Telephone follow up appointment with care management team member scheduled for: 07-13-2022 at 1 pm       COMPLETED: Effective management of GI sx and sx       Care Coordination Interventions: Evaluation of current treatment plan related to GI sx and sx and elevated Magnesium level and patient's adherence to plan as established by provider Advised patient to call the office for changes in her GI health and well being, constipation, or questions and concerns related to what can cause high magnesium Provided education to patient re: foods that are high in magnesium  Reviewed medications with patient and discussed compliance. The patient is taking Dance movement psychotherapist for women. On the label it states it has 100 mg of Magnesium in it. Education provided. Will also collaborate with pcp Collaborated with pcp regarding the patients question about Centrum Silver for Women and if she should stop taking and what could be an alternate substitute for the [employer] Provided  patient with foods high in Magnesium, also how to prevent constipation educational materials related to elevated Magnesium and GI sx and sx of constipation and gas at times. She saw the GI specialist and she was told to take laxative BID if needed to prevent constipation, not to use straws, and other ways of preventing gas. The GI specialist does not want to do a colonoscopy because of risk the patient has if being put to sleep.  Reviewed  scheduled/upcoming provider appointments including 01-22-2022 at 145 for AWV and 03-23-2022 at 1020 am with pcp Discussed plans with patient for ongoing care management follow up and provided patient with direct contact information for care management team Advised patient to discuss changes in her GI sx and sx, or concerns about elevated Magnesium  with provider        COMPLETED: RNCM: Effective Management of DM       Care Coordination Interventions:  Lab Results  Component Value Date   HGBA1C 6.3 (H) 12/21/2021    Provided education to patient about basic DM disease process Reviewed medications with patient and discussed importance of medication adherence Counseled on importance of regular laboratory monitoring as prescribed Discussed plans with patient for ongoing care management follow up and provided patient with direct contact information for care management team Provided patient with written educational materials related to hypo and hyperglycemia and importance of correct treatment Reviewed scheduled/upcoming provider appointments including: 03-23-2022 at 1020 am Advised patient, providing education and rationale, to check cbg asd directed  and record, calling pcp for findings outside established parameters. The patient states her range has been 116-145 and hardly ever up to 145.  The patient states she still has the boil area that she saw Jolene for in the past. She said it is better but not gone away. Advised the patient to discuss with Jolene at her upcoming visit. The RNCM will also let the pcp know by in basket message. The patient denies any acute distress related to the boil.  Review of patient status, including review of consultants reports, relevant laboratory and other test results, and medications completed        COMPLETED: RNCM: Effective Management of Pain       Care Coordination Interventions:  Reviewed provider established plan for pain management. Still having pain  but this is not limiting her from doing her activities. Is steadily losing weight. The patient feels this will help with her pain issues Discussed importance of adherence to all scheduled medical appointments. The patient has an appointment 03-23-2022 with pcp.  Counseled on the importance of reporting any/all new or changed pain symptoms or management strategies to pain management provider Advised patient to report to care team affect of pain on daily activities. Remains active and is thankful that she is able to maintain her activity level.  Discussed use of relaxation techniques and/or diversional activities to assist with pain reduction (distraction, imagery, relaxation, massage, acupressure, TENS, heat, and cold application Reviewed with patient prescribed pharmacological and nonpharmacological pain relief strategies. Patient is compliant with medications Advised patient to discuss unresolved pain, changes in level or intensity of pain  with provider Screening for signs and symptoms of depression related to chronic disease state  Assessed social determinant of health barriers AWV: completed 01-22-2022           Plan:Telephone follow up appointment with care management team member scheduled for:  07-13-2022 at 1 pm  [place], MSN, CCM RN Care Manager  Chronic Care Management  Direct Number: [phone]

## 2022-05-12 NOTE — Plan of Care (Signed)
Chronic Care Management Provider Comprehensive Care Plan    05/12/2022 Name: Cindy [name] MRN: [id number] DOB: 1950-06-06  Referral to Chronic Care Management (CCM) services was placed by Provider:  Marnee Guarneri, NP on Date: 03-25-2022.  Chronic Condition 1: DM Provider Assessment and Plan  Chronic, ongoing with A1c 6.1% today, downward trend and urine PRT/CRT ratio 16 August 2021.  Praised for ongoing success. - Continue Jardiance 25 MG and Ozempic 0.5 MG weekly at this time, as seeing significant benefit with this.  Gets via CCM assistance. - Maintain off Glimepiride due to her age and risk for hypoglycemia. Plus maintain off Metformin due to kidney health. - If poor control once at MAX doses of GLP1 and SGLT2, then will consider referral to endo, but at this time will continue to work on this in office which patient agrees with. - Check BS TID and focus on diabetic diet.    - Return in 3 months for A1c -- but alert provider prior to if elevations in BS >300 or <70.           Relevant Medications    atorvastatin (LIPITOR) 40 MG tablet    olmesartan (BENICAR) 40 MG tablet    Other Relevant Orders    Bayer DCA Hb A1c Waived    Basic metabolic panel     Expected Outcome/Goals Addressed This Visit (Provider CCM goals/Provider Assessment and plan   CCM (Diabetes)  EXPECTED OUTCOME:  MONITOR,SELF- MANAGE AND REDUCE SYMPTOMS OF Diabetes  Symptom Management Condition 1: Take all medications as prescribed Attend all scheduled provider appointments Call provider office for new concerns or questions  call the Suicide and Crisis Lifeline: 988 call the [place] National Suicide Prevention Lifeline: 606-398-0150 or TTY: 458 591 1781 [place] 226-365-6244) to talk to a trained counselor call 1-800-273-TALK (toll free, 24 hour hotline) if experiencing a Mental Health or [place]  check feet daily for cuts, sores or redness trim toenails straight across manage portion  size wash and dry feet carefully every day wear comfortable, cotton socks wear comfortable, well-fitting shoes  Chronic Condition 2: HTN Provider Assessment and Plan  Chronic, stable.  BP at goal in office and at home.  At this time will continue current regimen and adjust as needed based on BP and HR.  Continue collaboration with cardiology.  Recommend she monitor BP at least a few mornings a week at home and document.  DASH diet at home.  Labs: BMP.  Return in 3 months.           Relevant Medications    atorvastatin (LIPITOR) 40 MG tablet    bisoprolol (ZEBETA) 10 MG tablet    furosemide (LASIX) 80 MG tablet    minoxidil (LONITEN) 10 MG tablet    olmesartan (BENICAR) 40 MG tablet    spironolactone (ALDACTONE) 25 MG tablet    Other Relevant Orders    Bayer DCA Hb A1c Waived    Lipid Panel w/o Chol/HDL Ratio    Basic metabolic panel     Expected Outcome/Goals Addressed This Visit (Provider CCM goals/Provider Assessment and plan   CCM (HYPERTENSION)  EXPECTED OUTCOME:  MONITOR,SELF- MANAGE AND REDUCE SYMPTOMS OF HYPERTENSION  Symptom Management Condition 2: Take all medications as prescribed Attend all scheduled provider appointments Call provider office for new concerns or questions  call the Suicide and Crisis Lifeline: 988 call the [place] National Suicide Prevention Lifeline: (757) 178-8673 or TTY: 440-658-9791 TTY 7340956153) to talk to a trained counselor call 1-800-273-TALK (toll free, 24  hour hotline) if experiencing a Mental Health or [place]  check blood pressure weekly learn about high blood pressure call doctor for signs and symptoms of high blood pressure keep all doctor appointments take medications for blood pressure exactly as prescribed report new symptoms to your doctor  Chronic Condition 3: AFIB Provider Assessment and Plan  Chronic, ongoing, followed by cardiology.  Continue Eliquis and Metoprolol + collaboration with cardiology team.   Rate controlled.  Continue to collaborate with CCM team.         Relevant Medications    atorvastatin (LIPITOR) 40 MG tablet    bisoprolol (ZEBETA) 10 MG tablet    furosemide (LASIX) 80 MG tablet    minoxidil (LONITEN) 10 MG tablet    olmesartan (BENICAR) 40 MG tablet    spironolactone (ALDACTONE) 25 MG tablet    Other Relevant Orders    Lipid Panel w/o Chol/HDL Ratio    Basic metabolic panel     Expected Outcome/Goals Addressed This Visit (Provider CCM goals/Provider Assessment and plan  CCM (AFIB)  EXPECTED OUTCOME:  MONITOR,SELF- MANAGE AND REDUCE SYMPTOMS OF AFIB  Symptom Management Condition 3: Take all medications as prescribed Attend all scheduled provider appointments Call provider office for new concerns or questions  call the Suicide and Crisis Lifeline: 988 call the [place] National Suicide Prevention Lifeline: 8626430158 or TTY: 628-271-0113 TTY (608) 199-5974) to talk to a trained counselor call 1-800-273-TALK (toll free, 24 hour hotline) if experiencing a Mental Health or [place]  make a plan to eat healthy keep all lab appointments take medicine as prescribed  Chronic Condition 4: CKD Provider Assessment and Plan  Chronic, ongoing with recent labs stable with nephrology.  At this time reduce and renal dose medications as needed + continue collaboration with nephrology.  Labs: BMP today.  Avoid NSAIDs.         Relevant Medications    atorvastatin (LIPITOR) 40 MG tablet    olmesartan (BENICAR) 40 MG tablet    Other Relevant Orders    Bayer DCA Hb A1c Waived    Basic metabolic panel     Expected Outcome/Goals Addressed This Visit (Provider CCM goals/Provider Assessment and plan    CCM (CKD)  EXPECTED OUTCOME:  MONITOR,SELF- MANAGE AND REDUCE SYMPTOMS OF CKD  Symptom Management Condition 4: Take all medications as prescribed Attend all scheduled provider appointments Call provider office for new concerns or questions  call the Suicide and  Crisis Lifeline: 988 call the [place] National Suicide Prevention Lifeline: 9864866296 or TTY: 416-683-1693 [place] [phone]) to talk to a trained counselor call 1-800-273-TALK (toll free, 24 hour hotline) if experiencing a Mental Health or [place] Crisis   Problem List Patient Active Problem List   Diagnosis Date Noted   Left ventricular hypertrophy 06/21/2021   Primary hyperparathyroidism ([place]) 03/14/2021   Recurrent boils 12/03/2020   Morbid obesity ([place]) 12/03/2020   CKD stage 4 due to type 2 diabetes mellitus ([place]) 12/01/2020   Aortic atherosclerosis ([place]) 12/01/2020   Hypertension associated with diabetes ([place]) 07/15/2020   Paroxysmal atrial fibrillation ([place]) 07/15/2020   Chronic pain syndrome 07/15/2020   Depression, major, single episode, moderate ([place]) 07/15/2020   Type 2 diabetes mellitus with morbid obesity ([place]) 07/15/2020   GERD (gastroesophageal reflux disease) 07/15/2020   Hyperlipidemia associated with type 2 diabetes mellitus ([place]) 07/15/2020   OSA (obstructive sleep apnea) 07/15/2020   Other thrombophilia ([place]) 03/12/2020   History of lumbar spinal fusion 04/26/2018   Hypertensive retinopathy of both eyes  06/08/2017   DDD (degenerative disc disease), lumbar 10/31/2014   Cervical spondylosis with myelopathy 02/22/2014   Anemia in chronic kidney disease (CKD) 02/22/2014   Mitral regurgitation 09/08/2013    Medication Management  Current Outpatient Medications:    acetaminophen (TYLENOL) 650 MG CR tablet, Take 650 mg by mouth every 8 (eight) hours as needed for pain., Disp: , Rfl:    apixaban (ELIQUIS) 5 MG TABS tablet, Take 1 tablet (5 mg total) by mouth 2 (two) times daily., Disp: 60 tablet, Rfl: 0   atorvastatin (LIPITOR) 40 MG tablet, Take 1 tablet (40 mg total) by mouth daily., Disp: 90 tablet, Rfl: 4   azelastine (ASTELIN) 0.1 % nasal spray, Place 1 spray into both nostrils 2 (two) times daily. Use in each nostril as directed, Disp: 30 mL, Rfl:  12   bisoprolol (ZEBETA) 10 MG tablet, TAKE 1 TABLET(10 MG) BY MOUTH TWICE DAILY, Disp: 180 tablet, Rfl: 4   busPIRone (BUSPAR) 5 MG tablet, Take 1 tablet (5 mg total) by mouth 2 (two) times daily., Disp: 180 tablet, Rfl: 4   calcitRIOL (ROCALTROL) 0.25 MCG capsule, Take 1 capsule by mouth daily., Disp: , Rfl:    cetirizine (ZYRTEC) 10 MG tablet, Take 10 mg by mouth daily., Disp: , Rfl:    diltiazem (CARDIZEM CD) 360 MG 24 hr capsule, Take 1 capsule (360 mg total) by mouth daily., Disp: 90 capsule, Rfl: 4   DULoxetine (CYMBALTA) 60 MG capsule, Take 1 capsule (60 mg total) by mouth daily., Disp: 90 capsule, Rfl: 4   empagliflozin (JARDIANCE) 25 MG TABS tablet, Take 1 tablet (25 mg total) by mouth daily before breakfast., Disp: 90 tablet, Rfl: 4   ferrous sulfate 325 (65 FE) MG tablet, Take 1 tablet (325 mg total) by mouth 2 (two) times daily with a meal., Disp: 60 tablet, Rfl: 1   fluticasone (FLONASE) 50 MCG/ACT nasal spray, Place 2 sprays into both nostrils daily., Disp: 16 g, Rfl: 7   furosemide (LASIX) 80 MG tablet, TAKE ONE TABLET BY MOUTH TWICE DAILY FOR FLUID, Disp: 180 tablet, Rfl: 4   minoxidil (LONITEN) 10 MG tablet, TAKE ONE TABLET BY MOUTH ONCE DAILY FOR BLOOD PRESSURE, Disp: 90 tablet, Rfl: 4   montelukast (SINGULAIR) 10 MG tablet, Take 1 tablet (10 mg total) by mouth at bedtime., Disp: 90 tablet, Rfl: 4   mupirocin ointment (BACTROBAN) 2 %, Apply 1 Application topically 2 (two) times daily., Disp: 22 g, Rfl: 0   olmesartan (BENICAR) 20 MG tablet, Take 1 tablet (20 mg total) by mouth daily., Disp: 90 tablet, Rfl: 0   olmesartan (BENICAR) 40 MG tablet, Take 1 tablet (40 mg total) by mouth daily., Disp: 90 tablet, Rfl: 4   omeprazole (PRILOSEC) 20 MG capsule, Take 20 mg by mouth daily as needed (heartburn). , Disp: , Rfl:    polyethylene glycol powder (GLYCOLAX/MIRALAX) 17 GM/SCOOP powder, MIX 17 GRAMS(1 CAPFUL) OF POWDER IN 4 TO 8 OUNCES OF FLUID AND TAKE BY MOUTH ONCE DAILY AS NEEDED FOR  CONSTIPATION, Disp: 850 g, Rfl: 4   Semaglutide,0.25 or 0.'5MG'$ /DOS, (OZEMPIC, 0.25 OR 0.5 MG/DOSE,) 2 MG/1.5ML SOPN, Inject 0.5 mg into the skin once a week., Disp: 4.5 mL, Rfl: 4   spironolactone (ALDACTONE) 25 MG tablet, Take 1 tablet (25 mg total) by mouth daily., Disp: 90 tablet, Rfl: 4  Cognitive Assessment Identity Confirmed: : Name; DOB Cognitive Status: Normal   Functional Assessment Hearing Difficulty or Deaf: yes Hearing Management: says she is hard of hearing, is  interested in hearing Wear Glasses or Blind: yes Vision Management: wears glasses Concentrating, Remembering or Making Decisions Difficulty (CP): no Difficulty Communicating: no Difficulty Eating/Swallowing: no Walking or Climbing Stairs Difficulty: yes Walking or Climbing Stairs: ambulation difficulty, requires equipment Mobility Management: Uses a cane when walking, if walking a long distance uses a walker Dressing/Bathing Difficulty: no Doing Errands Independently Difficulty (such as shopping) (CP): no Change in Functional Status Since Onset of Current Illness/Injury: no   Caregiver Assessment  Primary Source of Support/Comfort: child(ren); spouse Name of Support/Comfort Primary Source: husband- Joneen Caraway and daughter-Michele People in Home: spouse Name(s) of People in Home: husband- Russell Family Caregiver if Needed: none Primary Roles/Responsibilities: retired   Planned Interventions  Provider order and care plan reviewed. Collaborated with PharmD regarding patient care and plan. Counseled on increased risk of stroke due to Afib and benefits of anticoagulation for stroke prevention           Reviewed importance of adherence to anticoagulant exactly as prescribed Advised patient to discuss changes in her heart rate or AFIB with provider Counseled on bleeding risk associated with AFIB and importance of self-monitoring for signs/symptoms of bleeding Counseled on avoidance of NSAIDs due to increased bleeding  risk with anticoagulants Counseled on importance of regular laboratory monitoring as prescribed Counseled on seeking medical attention after a head injury or if there is blood in the urine/stool Afib action plan reviewed Screening for signs and symptoms of depression related to chronic disease state Assessed social determinant of health barriers Provided education to patient about basic DM disease process; Reviewed medications with patient and discussed importance of medication adherence;        Reviewed prescribed diet with patient heart healthy/ADA diet ; Counseled on importance of regular laboratory monitoring as prescribed;        Discussed plans with patient for ongoing care management follow up and provided patient with direct contact information for care management team;      Provided patient with written educational materials related to hypo and hyperglycemia and importance of correct treatment;       Reviewed scheduled/upcoming provider appointments including: 06-23-2022 at 10 am;         Advised patient, providing education and rationale, to check cbg once daily and when you have symptoms of low or high blood sugar and record        Review of patient status, including review of consultants reports, relevant laboratory and other test results, and medications completed;       Advised patient to discuss changes in DM or questions or concerns with provider;      Screening for signs and symptoms of depression related to chronic disease state;        Assessed social determinant of health barriers;       Assessed the patient     understanding of chronic kidney disease    Evaluation of current treatment plan related to chronic kidney disease self management and patient's adherence to plan as established by provider      Provided education to patient re: stroke prevention, s/s of heart attack and stroke    Reviewed prescribed diet heart healthy/ADA diet  Reviewed medications with patient and  discussed importance of compliance    Advised patient, providing education and rationale, to monitor blood pressure daily and record, calling PCP for findings outside established parameters    Discussed complications of poorly controlled blood pressure such as heart disease, stroke, circulatory complications, vision complications, kidney impairment, sexual dysfunction  Reviewed scheduled/upcoming provider appointments including: 06-23-2022 at 10 am, saw nephrologist in November of 2023   Advised patient to discuss changes in her kidney function, questions or concerns with provider    Discussed plans with patient for ongoing care management follow up and provided patient with direct contact information for care management team    Screening for signs and symptoms of depression related to chronic disease state      Discussed the impact of chronic kidney disease on daily life and mental health and acknowledged and normalized feelings of disempowerment, fear, and frustration    Assessed social determinant of health barriers    Provided education on kidney disease progression    Engage patient in early, proactive and ongoing discussion about goals of care and what matters most to them    Support coping and stress management by recognizing current strategies and assist in developing new strategies such as mindfulness, journaling, relaxation techniques, problem-solving     Evaluation of current treatment plan related to hypertension self management and patient's adherence to plan as established by provider;   Provided education to patient re: stroke prevention, s/s of heart attack and stroke; Reviewed prescribed diet heart healthy/ADA diet  Reviewed medications with patient and discussed importance of compliance;  Discussed plans with patient for ongoing care management follow up and provided patient with direct contact information for care management team; Advised patient, providing education and  rationale, to monitor blood pressure daily and record, calling PCP for findings outside established parameters;  Reviewed scheduled/upcoming provider appointments including: 06-23-2022 at 10 am, saw cardiologist today and got a good report. No changes in the plan of care Advised patient to discuss changes in blood pressure or heart health with provider; Provided education on prescribed diet heart healthy/ADA diet ;  Discussed complications of poorly controlled blood pressure such as heart disease, stroke, circulatory complications, vision complications, kidney impairment, sexual dysfunction;  Screening for signs and symptoms of depression related to chronic disease state;  Assessed social determinant of health barriers;    Interaction and coordination with outside resources, practitioners, and providers See CCM Referral  Care Plan: Patient declined

## 2022-05-12 NOTE — Patient Instructions (Signed)
Please call the care guide team at [phone] if you need to cancel or reschedule your appointment.   If you are experiencing a Mental Health or [place] or need someone to talk to, please call the Suicide and Crisis Lifeline: 988 call the [place] National Suicide Prevention Lifeline: 714-781-5660 or TTY: 2041405244 TTY [phone]) to talk to a trained counselor call 1-800-273-TALK (toll free, 24 hour hotline)   Following is a copy of your full provider care plan:   Goals Addressed             This Visit's Progress    CCM Expected Outcome:  Monitor, Self-Manage and Reduce Symptoms of Afib       Current Barriers:  Chronic Disease Management support and education needs related to effective management of AFIB  Planned Interventions: Provider order and care plan reviewed. Collaborated with PharmD regarding patient care and plan. Counseled on increased risk of stroke due to Afib and benefits of anticoagulation for stroke prevention           Reviewed importance of adherence to anticoagulant exactly as prescribed Advised patient to discuss changes in her heart rate or AFIB with provider Counseled on bleeding risk associated with AFIB and importance of self-monitoring for signs/symptoms of bleeding Counseled on avoidance of NSAIDs due to increased bleeding risk with anticoagulants Counseled on importance of regular laboratory monitoring as prescribed Counseled on seeking medical attention after a head injury or if there is blood in the urine/stool Afib action plan reviewed Screening for signs and symptoms of depression related to chronic disease state Assessed social determinant of health barriers  Symptom Management: Take medications as prescribed   Attend all scheduled provider appointments Call provider office for new concerns or questions  call the Suicide and Crisis Lifeline: 988 call the [place] National Suicide Prevention Lifeline: 929 390 2542 or TTY:  912-860-7228 TTY [phone]) to talk to a trained counselor call 1-800-273-TALK (toll free, 24 hour hotline) if experiencing a Mental Health or [place]  - make a plan to eat healthy - keep all lab appointments - take medicine as prescribed  Follow Up Plan: Telephone follow up appointment with care management team member scheduled for: 07-13-2022 at 1 pm       CCM Expected Outcome:  Monitor, Self-Manage and Reduce Symptoms of Diabetes       Current Barriers:  Chronic Disease Management support and education needs related to effective management of DM [employer].   Planned Interventions: Provided education to patient about basic DM disease process; Reviewed medications with patient and discussed importance of medication adherence;        Reviewed prescribed diet with patient heart healthy/ADA diet ; Counseled on importance of regular laboratory monitoring as prescribed;        Discussed plans with patient for ongoing care management follow up and provided patient with direct contact information for care management team;      Provided patient with written educational materials related to hypo and hyperglycemia and importance of correct treatment;       Reviewed scheduled/upcoming provider appointments including: 06-23-2022 at 10 am;         Advised patient, providing education and rationale, to check cbg once daily and when you have symptoms of low or high blood sugar and record        Review of patient status, including review of consultants reports, relevant laboratory and other test results, and medications completed;       Advised patient to discuss  changes in DM or questions or concerns with provider;      Screening for signs and symptoms of depression related to chronic disease state;        Assessed social determinant of health barriers;         Symptom Management: Take medications as prescribed   Attend all scheduled provider appointments Call  provider office for new concerns or questions  call the Suicide and Crisis Lifeline: 988 call the [place] National Suicide Prevention Lifeline: 323 484 8726 or TTY: 9495412709 TTY [phone]) to talk to a trained counselor call 1-800-273-TALK (toll free, 24 hour hotline) if experiencing a Mental Health or [place]  check feet daily for cuts, sores or redness trim toenails straight across manage portion size wash and dry feet carefully every day wear comfortable, cotton socks wear comfortable, well-fitting shoes  Follow Up Plan: Telephone follow up appointment with care management team member scheduled for: 07-13-2022 at 1 pm       CCM Expected Outcome:  Monitor, Self-Manage and Reduce Symptoms of: CKD       Current Barriers:  Knowledge Deficits related to importance of kidney protection and monitoring for dehydration or changes in conditions that may impact kidney function Chronic Disease Management support and education needs related to effective management of CKD EGFR at 30  Planned Interventions: Assessed the patient     understanding of chronic kidney disease    Evaluation of current treatment plan related to chronic kidney disease self management and patient's adherence to plan as established by provider      Provided education to patient re: stroke prevention, s/s of heart attack and stroke    Reviewed prescribed diet heart healthy/ADA diet  Reviewed medications with patient and discussed importance of compliance    Advised patient, providing education and rationale, to monitor blood pressure daily and record, calling PCP for findings outside established parameters    Discussed complications of poorly controlled blood pressure such as heart disease, stroke, circulatory complications, vision complications, kidney impairment, sexual dysfunction    Reviewed scheduled/upcoming provider appointments including: 06-23-2022 at 10 am, saw nephrologist in November of 2023    Advised patient to discuss changes in her kidney function, questions or concerns with provider    Discussed plans with patient for ongoing care management follow up and provided patient with direct contact information for care management team    Screening for signs and symptoms of depression related to chronic disease state      Discussed the impact of chronic kidney disease on daily life and mental health and acknowledged and normalized feelings of disempowerment, fear, and frustration    Assessed social determinant of health barriers    Provided education on kidney disease progression    Engage patient in early, proactive and ongoing discussion about goals of care and what matters most to them    Support coping and stress management by recognizing current strategies and assist in developing new strategies such as mindfulness, journaling, relaxation techniques, problem-solving     Symptom Management: Take medications as prescribed   Attend all scheduled provider appointments Call provider office for new concerns or questions  call the Suicide and Crisis Lifeline: 988 call the [place] National Suicide Prevention Lifeline: (773)605-8780 or TTY: (947) 194-3794 TTY 5851099903) to talk to a trained counselor call 1-800-273-TALK (toll free, 24 hour hotline) if experiencing a Mental Health or Behavioral Health Crisis   Follow Up Plan: Telephone follow up appointment with care management team member scheduled for: 07-13-2022 at 1  pm       CCM Expected Outcome:  Monitor, Self-Manage, and Reduce Symptoms of Hypertension       Current Barriers:  Chronic Disease Management support and education needs related to effective management of HTN  Planned Interventions: Evaluation of current treatment plan related to hypertension self management and patient's adherence to plan as established by provider;   Provided education to patient re: stroke prevention, s/s of heart attack and stroke; Reviewed  prescribed diet heart healthy/ADA diet  Reviewed medications with patient and discussed importance of compliance;  Discussed plans with patient for ongoing care management follow up and provided patient with direct contact information for care management team; Advised patient, providing education and rationale, to monitor blood pressure daily and record, calling PCP for findings outside established parameters;  Reviewed scheduled/upcoming provider appointments including: 06-23-2022 at 10 am, saw cardiologist today and got a good report. No changes in the plan of care Advised patient to discuss changes in blood pressure or heart health with provider; Provided education on prescribed diet heart healthy/ADA diet ;  Discussed complications of poorly controlled blood pressure such as heart disease, stroke, circulatory complications, vision complications, kidney impairment, sexual dysfunction;  Screening for signs and symptoms of depression related to chronic disease state;  Assessed social determinant of health barriers;   Symptom Management: Take medications as prescribed   Attend all scheduled provider appointments Call provider office for new concerns or questions  call the Suicide and Crisis Lifeline: 988 call the [place] National Suicide Prevention Lifeline: 979 005 0707 or TTY: 647-630-7415 TTY [phone]) to talk to a trained counselor call 1-800-273-TALK (toll free, 24 hour hotline) if experiencing a Mental Health or [place]  check blood pressure weekly call doctor for signs and symptoms of high blood pressure develop an action plan for high blood pressure keep all doctor appointments take medications for blood pressure exactly as prescribed report new symptoms to your doctor  Follow Up Plan: Telephone follow up appointment with care management team member scheduled for: 07-13-2022 at 1 pm       COMPLETED: Effective management of GI sx and sx       Care Coordination  Interventions: Evaluation of current treatment plan related to GI sx and sx and elevated Magnesium level and patient's adherence to plan as established by provider Advised patient to call the office for changes in her GI health and well being, constipation, or questions and concerns related to what can cause high magnesium Provided education to patient re: foods that are high in magnesium  Reviewed medications with patient and discussed compliance. The patient is taking [employer] movement psychotherapist for women. On the label it states it has 100 mg of Magnesium in it. Education provided. Will also collaborate with pcp Collaborated with pcp regarding the patients question about Centrum Silver for Women and if she should stop taking and what could be an alternate substitute for the [employer] Provided patient with foods high in Magnesium, also how to prevent constipation educational materials related to elevated Magnesium and GI sx and sx of constipation and gas at times. She saw the GI specialist and she was told to take laxative BID if needed to prevent constipation, not to use straws, and other ways of preventing gas. The GI specialist does not want to do a colonoscopy because of risk the patient has if being put to sleep.  Reviewed scheduled/upcoming provider appointments including 01-22-2022 at 145 for AWV and 03-23-2022 at 1020 am with pcp Discussed plans with  patient for ongoing care management follow up and provided patient with direct contact information for care management team Advised patient to discuss changes in her GI sx and sx, or concerns about elevated Magnesium  with provider        COMPLETED: RNCM: Effective Management of DM       Care Coordination Interventions:  Lab Results  Component Value Date   HGBA1C 6.3 (H) 12/21/2021    Provided education to patient about basic DM disease process Reviewed medications with patient and discussed importance of medication adherence Counseled on  importance of regular laboratory monitoring as prescribed Discussed plans with patient for ongoing care management follow up and provided patient with direct contact information for care management team Provided patient with written educational materials related to hypo and hyperglycemia and importance of correct treatment Reviewed scheduled/upcoming provider appointments including: 03-23-2022 at 1020 am Advised patient, providing education and rationale, to check cbg asd directed  and record, calling pcp for findings outside established parameters. The patient states her range has been 116-145 and hardly ever up to 145.  The patient states she still has the boil area that she saw Jolene for in the past. She said it is better but not gone away. Advised the patient to discuss with Jolene at her upcoming visit. The RNCM will also let the pcp know by in basket message. The patient denies any acute distress related to the boil.  Review of patient status, including review of consultants reports, relevant laboratory and other test results, and medications completed        COMPLETED: RNCM: Effective Management of Pain       Care Coordination Interventions:  Reviewed provider established plan for pain management. Still having pain but this is not limiting her from doing her activities. Is steadily losing weight. The patient feels this will help with her pain issues Discussed importance of adherence to all scheduled medical appointments. The patient has an appointment 03-23-2022 with pcp.  Counseled on the importance of reporting any/all new or changed pain symptoms or management strategies to pain management provider Advised patient to report to care team affect of pain on daily activities. Remains active and is thankful that she is able to maintain her activity level.  Discussed use of relaxation techniques and/or diversional activities to assist with pain reduction (distraction, imagery, relaxation,  massage, acupressure, TENS, heat, and cold application Reviewed with patient prescribed pharmacological and nonpharmacological pain relief strategies. Patient is compliant with medications Advised patient to discuss unresolved pain, changes in level or intensity of pain  with provider Screening for signs and symptoms of depression related to chronic disease state  Assessed social determinant of health barriers AWV: completed 01-22-2022           The patient verbalized understanding of instructions, educational materials, and care plan provided today and DECLINED offer to receive copy of patient instructions, educational materials, and care plan.   Telephone follow up appointment with care management team member scheduled for: 07-13-2022 at 1 pm

## 2022-05-30 DIAGNOSIS — E1122 Type 2 diabetes mellitus with diabetic chronic kidney disease: Secondary | ICD-10-CM

## 2022-05-30 DIAGNOSIS — I4891 Unspecified atrial fibrillation: Secondary | ICD-10-CM

## 2022-05-30 DIAGNOSIS — E1159 Type 2 diabetes mellitus with other circulatory complications: Secondary | ICD-10-CM

## 2022-05-30 DIAGNOSIS — N184 Chronic kidney disease, stage 4 (severe): Secondary | ICD-10-CM | POA: Diagnosis not present

## 2022-05-30 DIAGNOSIS — I129 Hypertensive chronic kidney disease with stage 1 through stage 4 chronic kidney disease, or unspecified chronic kidney disease: Secondary | ICD-10-CM

## 2022-06-05 ENCOUNTER — Other Ambulatory Visit: Payer: Self-pay | Admitting: Nurse Practitioner

## 2022-06-07 NOTE — Telephone Encounter (Signed)
Refilled 03/23/2022 #180 4 rf. Requested Prescriptions  Pending Prescriptions Disp Refills   furosemide (LASIX) 80 MG tablet [Pharmacy Med Name: FUROSEMIDE '80MG'$  TABLETS] 180 tablet 4    Sig: TAKE ONE TABLET BY MOUTH TWICE DAILY FOR FLUID     Cardiovascular:  Diuretics - Loop Failed - 06/05/2022  3:38 AM      Failed - Cr in normal range and within 180 days    Creatinine  Date Value Ref Range Status  01/24/2014 0.86 0.60 - 1.30 mg/dL Final   Creatinine, Ser  Date Value Ref Range Status  04/05/2022 1.66 (H) 0.44 - 1.00 mg/dL Final         Failed - Mg Level in normal range and within 180 days    Magnesium  Date Value Ref Range Status  12/21/2021 2.4 (H) 1.6 - 2.3 mg/dL Final         Failed - Last BP in normal range    BP Readings from Last 1 Encounters:  04/05/22 (!) 155/64         Passed - K in normal range and within 180 days    Potassium  Date Value Ref Range Status  04/05/2022 3.6 3.5 - 5.1 mmol/L Final  01/24/2014 3.4 (L) 3.5 - 5.1 mmol/L Final         Passed - Ca in normal range and within 180 days    Calcium  Date Value Ref Range Status  04/05/2022 9.5 8.9 - 10.3 mg/dL Final   Calcium, Total  Date Value Ref Range Status  01/24/2014 8.5 8.5 - 10.1 mg/dL Final         Passed - Na in normal range and within 180 days    Sodium  Date Value Ref Range Status  04/05/2022 143 135 - 145 mmol/L Final  03/23/2022 143 134 - 144 mmol/L Final  01/24/2014 143 136 - 145 mmol/L Final         Passed - Cl in normal range and within 180 days    Chloride  Date Value Ref Range Status  04/05/2022 106 98 - 111 mmol/L Final  01/24/2014 105 98 - 107 mmol/L Final         Passed - Valid encounter within last 6 months    Recent Outpatient Visits           2 months ago Type 2 diabetes mellitus with morbid obesity ([place])   [place], Jolene T, NP   5 months ago Type 2 diabetes mellitus with morbid obesity ([place])   [place], Jolene T,  NP   8 months ago Type 2 diabetes mellitus with morbid obesity ([place])   [place], Jolene T, NP   10 months ago Type 2 diabetes mellitus with morbid obesity ([place])   [place], Jolene T, NP   11 months ago Type 2 diabetes mellitus with morbid obesity ([place])   [place], Barbaraann Faster, NP       Future Appointments             In 2 weeks Cannady, Barbaraann Faster, NP [facility], [facility]

## 2022-06-14 DIAGNOSIS — G4733 Obstructive sleep apnea (adult) (pediatric): Secondary | ICD-10-CM | POA: Diagnosis not present

## 2022-06-14 DIAGNOSIS — R0609 Other forms of dyspnea: Secondary | ICD-10-CM | POA: Diagnosis not present

## 2022-06-14 DIAGNOSIS — I480 Paroxysmal atrial fibrillation: Secondary | ICD-10-CM | POA: Diagnosis not present

## 2022-06-15 ENCOUNTER — Other Ambulatory Visit: Payer: Self-pay | Admitting: Nurse Practitioner

## 2022-06-15 NOTE — Telephone Encounter (Signed)
Rx 03/23/22 #90 4RF- 1 year supply Requested Prescriptions  Pending Prescriptions Disp Refills   spironolactone (ALDACTONE) 25 MG tablet [Pharmacy Med Name: SPIRONOLACTONE '25MG'$  TABLETS] 90 tablet 4    Sig: TAKE 1 TABLET BY MOUTH ONCE DAILY.     Cardiovascular: Diuretics - Aldosterone Antagonist Failed - 06/15/2022 12:20 PM      Failed - Cr in normal range and within 180 days    Creatinine  Date Value Ref Range Status  01/24/2014 0.86 0.60 - 1.30 mg/dL Final   Creatinine, Ser  Date Value Ref Range Status  04/05/2022 1.66 (H) 0.44 - 1.00 mg/dL Final         Failed - Last BP in normal range    BP Readings from Last 1 Encounters:  04/05/22 (!) 155/64         Passed - K in normal range and within 180 days    Potassium  Date Value Ref Range Status  04/05/2022 3.6 3.5 - 5.1 mmol/L Final  01/24/2014 3.4 (L) 3.5 - 5.1 mmol/L Final         Passed - Na in normal range and within 180 days    Sodium  Date Value Ref Range Status  04/05/2022 143 135 - 145 mmol/L Final  03/23/2022 143 134 - 144 mmol/L Final  01/24/2014 143 136 - 145 mmol/L Final         Passed - eGFR is 30 or above and within 180 days    EGFR (African American)  Date Value Ref Range Status  01/24/2014 >60  Final   GFR calc Af Amer  Date Value Ref Range Status  03/19/2017 >60 >60 mL/min Final    Comment:    (NOTE) The eGFR has been calculated using the CKD EPI equation. This calculation has not been validated in all clinical situations. eGFR's persistently <60 mL/min signify possible Chronic Kidney Disease.    EGFR (Non-African Amer.)  Date Value Ref Range Status  01/24/2014 >60  Final    Comment:    eGFR values <31m/min/1.73 m2 may be an indication of chronic kidney disease (CKD). Calculated eGFR is useful in patients with stable renal function. The eGFR calculation will not be reliable in acutely ill patients when serum creatinine is changing rapidly. It is not useful in  patients on dialysis. The eGFR  calculation may not be applicable to patients at the low and high extremes of body sizes, pregnant women, and vegetarians.    GFR, Estimated  Date Value Ref Range Status  04/05/2022 33 (L) >60 mL/min Final    Comment:    (NOTE) Calculated using the CKD-EPI Creatinine Equation (2021)    eGFR  Date Value Ref Range Status  03/23/2022 35 (L) >59 mL/min/1.73 Final         Passed - Valid encounter within last 6 months    Recent Outpatient Visits           2 months ago Type 2 diabetes mellitus with morbid [place]   [place]   5 months ago Type 2 diabetes [place] with morbid [place]   [place]   8 months ago Type 2 diabetes mellitus with morbid [place] [place]   10 months ago Type 2 diabetes mellitus with morbid obesity [place]   [place]   11 months ago Type 2 diabetes mellitus with morbid obesity [place]  [place], Barbaraann Faster, NP       Future Appointments             In 1 week Cannady, Barbaraann Faster, NP [facility]

## 2022-06-17 ENCOUNTER — Other Ambulatory Visit: Payer: Self-pay | Admitting: Nurse Practitioner

## 2022-06-17 NOTE — Telephone Encounter (Signed)
Refused Singulair because it was sent to a different pharmacy than this one that is requesting.

## 2022-06-20 NOTE — Patient Instructions (Incomplete)
Diabetes Mellitus Basics  Diabetes mellitus, or diabetes, is a long-term (chronic) disease. It occurs when the body does not properly use sugar (glucose) that is released from food after you eat. Diabetes mellitus may be caused by one or both of these problems: Your pancreas does not make enough of a hormone called insulin. Your body does not react in a normal way to the insulin that it makes. Insulin lets glucose enter cells in your body. This gives you energy. If you have diabetes, glucose cannot get into cells. This causes high blood glucose (hyperglycemia). How to treat and manage diabetes You may need to take insulin or other diabetes medicines daily to keep your glucose in balance. If you are prescribed insulin, you will learn how to give yourself insulin by injection. You may need to adjust the amount of insulin you take based on the foods that you eat. You will need to check your blood glucose levels using a glucose monitor as told by your health care provider. The readings can help determine if you have low or high blood glucose. Generally, you should have these blood glucose levels: Before meals (preprandial): 80-130 mg/dL (4.4-7.2 mmol/L). After meals (postprandial): below 180 mg/dL (10 mmol/L). Hemoglobin A1c (HbA1c) level: less than 7%. Your health care provider will set treatment goals for you. Keep all follow-up visits. This is important. Follow these instructions at home: Diabetes medicines Take your diabetes medicines every day as told by your health care provider. List your diabetes medicines here: Name of medicine: ______________________________ Amount (dose): _______________ Time (a.m./p.m.): _______________ Notes: ___________________________________ Name of medicine: ______________________________ Amount (dose): _______________ Time (a.m./p.m.): _______________ Notes: ___________________________________ Name of medicine: ______________________________ Amount (dose):  _______________ Time (a.m./p.m.): _______________ Notes: ___________________________________ Insulin If you use insulin, list the types of insulin you use here: Insulin type: ______________________________ Amount (dose): _______________ Time (a.m./p.m.): _______________Notes: ___________________________________ Insulin type: ______________________________ Amount (dose): _______________ Time (a.m./p.m.): _______________ Notes: ___________________________________ Insulin type: ______________________________ Amount (dose): _______________ Time (a.m./p.m.): _______________ Notes: ___________________________________ Insulin type: ______________________________ Amount (dose): _______________ Time (a.m./p.m.): _______________ Notes: ___________________________________ Insulin type: ______________________________ Amount (dose): _______________ Time (a.m./p.m.): _______________ Notes: ___________________________________ Managing blood glucose  Check your blood glucose levels using a glucose monitor as told by your health care provider. Write down the times that you check your glucose levels here: Time: _______________ Notes: ___________________________________ Time: _______________ Notes: ___________________________________ Time: _______________ Notes: ___________________________________ Time: _______________ Notes: ___________________________________ Time: _______________ Notes: ___________________________________ Time: _______________ Notes: ___________________________________  Low blood glucose Low blood glucose (hypoglycemia) is when glucose is at or below 70 mg/dL (3.9 mmol/L). Symptoms may include: Feeling: Hungry. Sweaty and clammy. Irritable or easily upset. Dizzy. Sleepy. Having: A fast heartbeat. A headache. A change in your vision. Numbness around the mouth, lips, or tongue. Having trouble with: Moving (coordination). Sleeping. Treating low blood glucose To treat low blood  glucose, eat or drink something containing sugar right away. If you can think clearly and swallow safely, follow the 15:15 rule: Take 15 grams of a fast-acting carb (carbohydrate), as told by your health care provider. Some fast-acting carbs are: Glucose tablets: take 3-4 tablets. Hard candy: eat 3-5 pieces. Fruit juice: drink 4 oz (120 mL). Regular (not diet) soda: drink 4-6 oz (120-180 mL). Honey or sugar: eat 1 Tbsp (15 mL). Check your blood glucose levels 15 minutes after you take the carb. If your glucose is still at or below 70 mg/dL (3.9 mmol/L), take 15 grams of a carb again. If your glucose does not go above 70 mg/dL (3.9 mmol/L) after   3 tries, get help right away. After your glucose goes back to normal, eat a meal or a snack within 1 hour. Treating very low blood glucose If your glucose is at or below 54 mg/dL (3 mmol/L), you have very low blood glucose (severe hypoglycemia). This is an emergency. Do not wait to see if the symptoms will go away. Get medical help right away. Call your local emergency services (911 in the [place] Do not drive yourself to the hospital. Questions to ask your health care provider Should I talk with a diabetes educator? What equipment will I need to care for myself at home? What diabetes medicines do I need? When should I take them? How often do I need to check my blood glucose levels? What number can I call if I have questions? When is my follow-up visit? Where can I find a support group for people with diabetes? Where to find more information American Diabetes Association: www.diabetes.org Association of Diabetes Care and Education Specialists: www.diabeteseducator.org Contact a health care provider if: Your blood glucose is at or above 240 mg/dL (13.3 mmol/L) for 2 days in a row. You have been sick or have had a fever for 2 days or more, and you are not getting better. You have any of these problems for more than 6 hours: You cannot eat or  drink. You feel nauseous. You vomit. You have diarrhea. Get help right away if: Your blood glucose is lower than 54 mg/dL (3 mmol/L). You get confused. You have trouble thinking clearly. You have trouble breathing. These symptoms may represent a serious problem that is an emergency. Do not wait to see if the symptoms will go away. Get medical help right away. Call your local emergency services (911 in the [place] Do not drive yourself to the hospital. Summary Diabetes mellitus is a chronic disease that occurs when the body does not properly use sugar (glucose) that is released from food after you eat. Take insulin and diabetes medicines as told. Check your blood glucose every day, as often as told. Keep all follow-up visits. This is important. This information is not intended to replace advice given to you by your health care provider. Make sure you discuss any questions you have with your health care provider. Document Revised: 09/18/2019 Document Reviewed: 09/18/2019 Elsevier Patient Education  2023 [employer]  

## 2022-06-23 ENCOUNTER — Ambulatory Visit: Payer: Medicare HMO | Admitting: Nurse Practitioner

## 2022-06-23 NOTE — Patient Instructions (Signed)
Diabetes Mellitus Basics  Diabetes mellitus, or diabetes, is a long-term (chronic) disease. It occurs when the body does not properly use sugar (glucose) that is released from food after you eat. Diabetes mellitus may be caused by one or both of these problems: Your pancreas does not make enough of a hormone called insulin. Your body does not react in a normal way to the insulin that it makes. Insulin lets glucose enter cells in your body. This gives you energy. If you have diabetes, glucose cannot get into cells. This causes high blood glucose (hyperglycemia). How to treat and manage diabetes You may need to take insulin or other diabetes medicines daily to keep your glucose in balance. If you are prescribed insulin, you will learn how to give yourself insulin by injection. You may need to adjust the amount of insulin you take based on the foods that you eat. You will need to check your blood glucose levels using a glucose monitor as told by your health care provider. The readings can help determine if you have low or high blood glucose. Generally, you should have these blood glucose levels: Before meals (preprandial): 80-130 mg/dL (4.4-7.2 mmol/L). After meals (postprandial): below 180 mg/dL (10 mmol/L). Hemoglobin A1c (HbA1c) level: less than 7%. Your health care provider will set treatment goals for you. Keep all follow-up visits. This is important. Follow these instructions at home: Diabetes medicines Take your diabetes medicines every day as told by your health care provider. List your diabetes medicines here: Name of medicine: ______________________________ Amount (dose): _______________ Time (a.m./p.m.): _______________ Notes: ___________________________________ Name of medicine: ______________________________ Amount (dose): _______________ Time (a.m./p.m.): _______________ Notes: ___________________________________ Name of medicine: ______________________________ Amount (dose):  _______________ Time (a.m./p.m.): _______________ Notes: ___________________________________ Insulin If you use insulin, list the types of insulin you use here: Insulin type: ______________________________ Amount (dose): _______________ Time (a.m./p.m.): _______________Notes: ___________________________________ Insulin type: ______________________________ Amount (dose): _______________ Time (a.m./p.m.): _______________ Notes: ___________________________________ Insulin type: ______________________________ Amount (dose): _______________ Time (a.m./p.m.): _______________ Notes: ___________________________________ Insulin type: ______________________________ Amount (dose): _______________ Time (a.m./p.m.): _______________ Notes: ___________________________________ Insulin type: ______________________________ Amount (dose): _______________ Time (a.m./p.m.): _______________ Notes: ___________________________________ Managing blood glucose  Check your blood glucose levels using a glucose monitor as told by your health care provider. Write down the times that you check your glucose levels here: Time: _______________ Notes: ___________________________________ Time: _______________ Notes: ___________________________________ Time: _______________ Notes: ___________________________________ Time: _______________ Notes: ___________________________________ Time: _______________ Notes: ___________________________________ Time: _______________ Notes: ___________________________________  Low blood glucose Low blood glucose (hypoglycemia) is when glucose is at or below 70 mg/dL (3.9 mmol/L). Symptoms may include: Feeling: Hungry. Sweaty and clammy. Irritable or easily upset. Dizzy. Sleepy. Having: A fast heartbeat. A headache. A change in your vision. Numbness around the mouth, lips, or tongue. Having trouble with: Moving (coordination). Sleeping. Treating low blood glucose To treat low blood  glucose, eat or drink something containing sugar right away. If you can think clearly and swallow safely, follow the 15:15 rule: Take 15 grams of a fast-acting carb (carbohydrate), as told by your health care provider. Some fast-acting carbs are: Glucose tablets: take 3-4 tablets. Hard candy: eat 3-5 pieces. Fruit juice: drink 4 oz (120 mL). Regular (not diet) soda: drink 4-6 oz (120-180 mL). Honey or sugar: eat 1 Tbsp (15 mL). Check your blood glucose levels 15 minutes after you take the carb. If your glucose is still at or below 70 mg/dL (3.9 mmol/L), take 15 grams of a carb again. If your glucose does not go above 70 mg/dL (3.9 mmol/L) after   3 tries, get help right away. After your glucose goes back to normal, eat a meal or a snack within 1 hour. Treating very low blood glucose If your glucose is at or below 54 mg/dL (3 mmol/L), you have very low blood glucose (severe hypoglycemia). This is an emergency. Do not wait to see if the symptoms will go away. Get medical help right away. Call your local emergency services (911 in the [place] Do not drive yourself to the hospital. Questions to ask your health care provider Should I talk with a diabetes educator? What equipment will I need to care for myself at home? What diabetes medicines do I need? When should I take them? How often do I need to check my blood glucose levels? What number can I call if I have questions? When is my follow-up visit? Where can I find a support group for people with diabetes? Where to find more information American Diabetes Association: www.diabetes.org Association of Diabetes Care and Education Specialists: www.diabeteseducator.org Contact a health care provider if: Your blood glucose is at or above 240 mg/dL (13.3 mmol/L) for 2 days in a row. You have been sick or have had a fever for 2 days or more, and you are not getting better. You have any of these problems for more than 6 hours: You cannot eat or  drink. You feel nauseous. You vomit. You have diarrhea. Get help right away if: Your blood glucose is lower than 54 mg/dL (3 mmol/L). You get confused. You have trouble thinking clearly. You have trouble breathing. These symptoms may represent a serious problem that is an emergency. Do not wait to see if the symptoms will go away. Get medical help right away. Call your local emergency services (911 in the [place] Do not drive yourself to the hospital. Summary Diabetes mellitus is a chronic disease that occurs when the body does not properly use sugar (glucose) that is released from food after you eat. Take insulin and diabetes medicines as told. Check your blood glucose every day, as often as told. Keep all follow-up visits. This is important. This information is not intended to replace advice given to you by your health care provider. Make sure you discuss any questions you have with your health care provider. Document Revised: 09/18/2019 Document Reviewed: 09/18/2019 Elsevier Patient Education  2023 [employer]  

## 2022-06-25 ENCOUNTER — Ambulatory Visit (INDEPENDENT_AMBULATORY_CARE_PROVIDER_SITE_OTHER): Payer: Medicare HMO | Admitting: Nurse Practitioner

## 2022-06-25 ENCOUNTER — Telehealth: Payer: Self-pay

## 2022-06-25 ENCOUNTER — Encounter: Payer: Self-pay | Admitting: Nurse Practitioner

## 2022-06-25 DIAGNOSIS — E210 Primary hyperparathyroidism: Secondary | ICD-10-CM | POA: Diagnosis not present

## 2022-06-25 DIAGNOSIS — D6869 Other thrombophilia: Secondary | ICD-10-CM

## 2022-06-25 DIAGNOSIS — N184 Chronic kidney disease, stage 4 (severe): Secondary | ICD-10-CM | POA: Diagnosis not present

## 2022-06-25 DIAGNOSIS — I340 Nonrheumatic mitral (valve) insufficiency: Secondary | ICD-10-CM

## 2022-06-25 DIAGNOSIS — E1122 Type 2 diabetes mellitus with diabetic chronic kidney disease: Secondary | ICD-10-CM

## 2022-06-25 DIAGNOSIS — R052 Subacute cough: Secondary | ICD-10-CM

## 2022-06-25 DIAGNOSIS — I700 Atherosclerosis of aorta: Secondary | ICD-10-CM

## 2022-06-25 DIAGNOSIS — E1159 Type 2 diabetes mellitus with other circulatory complications: Secondary | ICD-10-CM

## 2022-06-25 DIAGNOSIS — F321 Major depressive disorder, single episode, moderate: Secondary | ICD-10-CM

## 2022-06-25 DIAGNOSIS — E1169 Type 2 diabetes mellitus with other specified complication: Secondary | ICD-10-CM | POA: Diagnosis not present

## 2022-06-25 DIAGNOSIS — G4733 Obstructive sleep apnea (adult) (pediatric): Secondary | ICD-10-CM

## 2022-06-25 DIAGNOSIS — I480 Paroxysmal atrial fibrillation: Secondary | ICD-10-CM | POA: Diagnosis not present

## 2022-06-25 DIAGNOSIS — G8929 Other chronic pain: Secondary | ICD-10-CM

## 2022-06-25 DIAGNOSIS — E785 Hyperlipidemia, unspecified: Secondary | ICD-10-CM | POA: Diagnosis not present

## 2022-06-25 DIAGNOSIS — I152 Hypertension secondary to endocrine disorders: Secondary | ICD-10-CM

## 2022-06-25 DIAGNOSIS — M5441 Lumbago with sciatica, right side: Secondary | ICD-10-CM

## 2022-06-25 DIAGNOSIS — D631 Anemia in chronic kidney disease: Secondary | ICD-10-CM

## 2022-06-25 LAB — MICROALBUMIN, URINE WAIVED
Creatinine, Urine Waived: 50 mg/dL (ref 10–300)
Microalb, Ur Waived: 80 mg/L — ABNORMAL HIGH (ref 0–19)
Microalb/Creat Ratio: >300 mg/g — ABNORMAL HIGH (ref <30)

## 2022-06-25 LAB — BAYER DCA HB A1C WAIVED: HB A1C (BAYER DCA - WAIVED): 6.4 % — ABNORMAL HIGH (ref 4.8–5.6)

## 2022-06-25 MED ORDER — TIZANIDINE HCL 2 MG PO TABS: 2.0000 mg | ORAL_TABLET | Freq: Four times a day (QID) | ORAL | 0 refills | Status: DC | PRN

## 2022-06-25 MED ORDER — LIDOCAINE 5 % EX PTCH: 1.0000 | MEDICATED_PATCH | CUTANEOUS | 4 refills | Status: DC

## 2022-06-25 MED ORDER — AMOXICILLIN-POT CLAVULANATE 875-125 MG PO TABS: 1.0000 | ORAL_TABLET | Freq: Two times a day (BID) | ORAL | 0 refills | Status: AC

## 2022-06-25 MED ORDER — FERROUS SULFATE 325 (65 FE) MG PO TABS: 325.0000 mg | ORAL_TABLET | Freq: Two times a day (BID) | ORAL | 5 refills | Status: DC

## 2022-06-25 NOTE — Assessment & Plan Note (Signed)
BMI 38.64 with T2DM, HTN, CKD with some more loss noted, praised for this.  Recommended eating smaller high protein, low fat meals more frequently and exercising 30 mins a day 5 times a week with a goal of 10-15lb weight loss in the next 3 months. Patient voiced their understanding and motivation to adhere to these recommendations.

## 2022-06-25 NOTE — Assessment & Plan Note (Signed)
Chronic, ongoing with A1c 6.4% today, downward trend and urine ALB 80 January 2024.  Praised for ongoing success. - Continue Jardiance 25 MG and Ozempic 0.5 MG weekly at this time, as seeing significant benefit with this.  Gets via CCM assistance. - Maintain off Glimepiride due to her age and risk for hypoglycemia. Plus maintain off Metformin due to kidney health. - If poor control once at MAX doses of GLP1 and SGLT2, then will consider referral to endo, but at this time will continue to work on this in office which patient agrees with. - Check BS TID and focus on diabetic diet.    - Return in 3 months for A1c -- but alert provider prior to if elevations in BS >300 or <70.

## 2022-06-25 NOTE — Telephone Encounter (Signed)
PA started for Lidocaine patches through [employer]. Awaiting on determination  

## 2022-06-25 NOTE — Assessment & Plan Note (Signed)
Chronic, ongoing.  Continue 100% adherence to CPAP use at night.  Will place referral for new sleep study as recommended due patient report CPAP not working appropriately.

## 2022-06-25 NOTE — Assessment & Plan Note (Signed)
Chronic, ongoing.  Continue collaboration with nephrology and current medication regimen. Recent labs performed by them and stable.

## 2022-06-25 NOTE — Assessment & Plan Note (Signed)
Mild mitral and tricuspid regurgitation noted on echo June 2022.  Continue collaboration with cardiology and current medication regimen.

## 2022-06-25 NOTE — Assessment & Plan Note (Signed)
Chronic, ongoing.  At this time reduce and renal dose medications as needed + continue collaboration with nephrology.  Labs: up to date with nephrology.  Avoid NSAIDs.

## 2022-06-25 NOTE — Progress Notes (Signed)
BP 138/69   Pulse 64 Comment: apical  Temp 98.1 F (36.7 C) (Oral)   Ht 5' 0.98" (1.549 m)   Wt 204 lb 6.4 oz (92.7 kg)   SpO2 98%   BMI 38.64 kg/m    Subjective:    Patient ID: Cindy [name], female    DOB: 02-Jul-1950, 72 y.o.   MRN: [id number]  HPI: Cindy [name] is a 72 y.o. female  Chief Complaint  Patient presents with   Diabetes   Flank Pain    Entire right side is painful for past month. Has been using Voltaren Gel   Hypertension   Cough    Started about a week ago   DIABETES A1c 6.2% in October.  Taking Jardiance and Ozempic.  CCM for cost assist with Jardiance and Eliquis.   Hypoglycemic episodes:no Polydipsia/polyuria: no Visual disturbance: no Chest pain: no Paresthesias: no Glucose Monitoring: yes             Accucheck frequency: Daily             Fasting glucose: 120 to 130 range             Post prandial:             Evening:             Before meals: Taking Insulin?: no             Long acting insulin:             Short acting insulin: Blood Pressure Monitoring: a few times a week Retinal Examination: Up to Date -- [facility]  Foot Exam: Up to Date  Pneumovax: Up To Date  x 1, sick today will receive # 2 next visit Influenza: Not up to Date - refuses Aspirin: no    HYPERTENSION / HYPERLIPIDEMIA/A-FIB Followed by cardiology, last 05/12/22, they placed a referral to return to pulmonary.  Did see pulmonary in past on 01/13/21, but has returned on 06/14/22. Continues on Eliquis (gets via assistance), Atorvastatin, Diltiazem, Lasix, Olmesartan, Bisoprolol, Spironolactone, and Minoxidil. Uses CPAP every night, feels like she does not get enough air with this.   Last echo 11/26/20 with EF 60%.  Aortic atherosclerosis noted on 07/15/20 imaging.   Satisfied with current treatment? yes Duration of hypertension: chronic BP monitoring frequency: a few times a week BP range: 120-130/60-70 BP medication side effects: no Duration of hyperlipidemia:  chronic Cholesterol medication side effects: no Cholesterol supplements: none Medication compliance: good compliance Aspirin: no Recent stressors: no Recurrent headaches: no Visual changes: no Palpitations: no Dyspnea: ongoing with activity Chest pain: no Lower extremity edema: no Dizzy/lightheaded: occasional if changes positions too fast  CHRONIC KIDNEY DISEASE Labs on 04/13/22 with nephrology = CRT 1.58 GFR 35, PTH 100, H/H 11/33.4. CKD status: controlled Medications renally dose:  discussed with patient Previous renal evaluation: no Pneumovax:  Up To Date Influenza Vaccine:  Not up to Date --refuses  BACK PAIN Having pain to right lower back, into right hip and down right leg.  Has history of neck and lower back surgeries years ago.  She does exercises in the home.   Duration: months Mechanism of injury: no trauma Location: Right and low back Onset: gradual Severity: 10/10 Quality: dull, aching, and throbbing Frequency: constant Radiation: R leg below the knee Aggravating factors: lifting, movement, walking, and bending Alleviating factors: Voltaren gel, Tylenol Status: fluctuating Treatments attempted: Voltaren and Tylenol  Relief with NSAIDs?: No NSAIDs Taken Nighttime pain:  sometimes Paresthesias / decreased sensation:  no Bowel / bladder incontinence:  no Fevers:  no Dysuria / urinary frequency:  no  COUGH Started about 2 weeks ago.  Her husband recently had Covid and pneumonia about 3 weeks ago.  He quarantined best he could in house and she wore mask.  When cough started she did have runny nose, stuffy nose, and sinus issues -- still present.  Recent chest x-ray with pulmonary noted no pneumonia. Duration: weeks Circumstances of initial development of cough: URI Cough severity: mild Cough description: non-productive Aggravating factors:  worse in the AM Alleviating factors: Coricidin Status:  stable Treatments attempted: Coricidin Wheezing:  yes Shortness of breath: yes baseline Chest pain: no Chest tightness:no Nasal congestion: yes Runny nose: yes Postnasal drip: yes Frequent throat clearing or swallowing: yes Hemoptysis: no Fevers: no Night sweats: no Weight loss: no Heartburn: no Recent foreign travel: no Tuberculosis contacts: no    DEPRESSION Continues on Duloxetine and Buspar daily.   Mood status: stable Satisfied with current treatment?: yes Symptom severity: moderate  Duration of current treatment : chronic Side effects: no Medication compliance: good compliance Psychotherapy/counseling: none Depressed mood: occasional Anxious mood: occasional Anhedonia: no Significant weight loss or gain: no Insomnia: none Fatigue: no Feelings of worthlessness or guilt: no Impaired concentration/indecisiveness: no Suicidal ideations: no Hopelessness: no Crying spells: no    06/25/2022   10:05 AM 03/23/2022   10:06 AM 01/20/2022   11:43 AM 12/21/2021   10:21 AM 09/21/2021   10:18 AM  Depression screen PHQ 2/9  Decreased Interest 1 1 0 1 2  Down, Depressed, Hopeless '1 1 1 2 2  '$ PHQ - 2 Score '2 2 1 3 4  '$ Altered sleeping '1 1 2 2 '$ 0  Tired, decreased energy '1 1 2 1 2  '$ Change in appetite 1 0 0 0 1  Feeling bad or failure about yourself  0 0 0 0 0  Trouble concentrating 0 1 0 1 2  Moving slowly or fidgety/restless 0 1 0 0 1  Suicidal thoughts 0 0 0 0 0  PHQ-9 Score '5 6 5 7 10  '$ Difficult doing work/chores Not difficult at all Not difficult at all Not difficult at all Not difficult at all        06/25/2022   10:06 AM 03/23/2022   10:07 AM 12/21/2021   10:21 AM 09/21/2021   10:18 AM  GAD 7 : Generalized Anxiety Score  Nervous, Anxious, on Edge '1 1 1 2  '$ Control/stop worrying 0 '1 1 2  '$ Worry too much - different things '1 1 1 2  '$ Trouble relaxing 1 1 0 1  Restless 0 0 0 1  Easily annoyed or irritable 1 0 1 1  Afraid - awful might happen 0 0 0 1  Total GAD 7 Score '4 4 4 10  '$ Anxiety Difficulty Not difficult at all  Not difficult at all Not difficult at all Somewhat difficult   Relevant past medical, surgical, family and social history reviewed and updated as indicated. Interim medical history since our last visit reviewed. Allergies and medications reviewed and updated.  Review of Systems  Constitutional:  Negative for activity change, appetite change, diaphoresis, fatigue and fever.  Respiratory:  Positive for cough, shortness of breath and wheezing. Negative for chest tightness.   Cardiovascular:  Negative for chest pain, palpitations and leg swelling.  Gastrointestinal: Negative.   Endocrine: Negative for cold intolerance, heat intolerance, polydipsia, polyphagia and polyuria.  Musculoskeletal:  Positive for arthralgias and  back pain.  Neurological: Negative.   Psychiatric/Behavioral: Negative.      Per HPI unless specifically indicated above     Objective:    BP 138/69   Pulse 64 Comment: apical  Temp 98.1 F (36.7 C) (Oral)   Ht 5' 0.98" (1.549 m)   Wt 204 lb 6.4 oz (92.7 kg)   SpO2 98%   BMI 38.64 kg/m   Wt Readings from Last 3 Encounters:  06/25/22 204 lb 6.4 oz (92.7 kg)  04/05/22 216 lb (98 kg)  03/23/22 216 lb 3.2 oz (98.1 kg)    Physical Exam Vitals and nursing note reviewed. Exam conducted with a chaperone present.  Constitutional:      General: She is awake. She is not in acute distress.    Appearance: She is well-developed and well-groomed. She is morbidly obese. She is not ill-appearing or toxic-appearing.  HENT:     Head: Normocephalic.     Right Ear: Hearing normal.     Left Ear: Hearing normal.  Eyes:     General: Lids are normal.        Right eye: No discharge.        Left eye: No discharge.     Conjunctiva/sclera: Conjunctivae normal.     Pupils: Pupils are equal, round, and reactive to light.  Neck:     Thyroid: No thyromegaly.     Vascular: No carotid bruit.  Cardiovascular:     Rate and Rhythm: Normal rate and regular rhythm.     Heart sounds: Murmur  heard.     Systolic murmur is present with a grade of 2/6.     No gallop.  Pulmonary:     Effort: Pulmonary effort is normal. No accessory muscle usage or respiratory distress.     Breath sounds: Wheezing present. No decreased breath sounds or rhonchi.     Comments: Occasional expiratory wheezes noted throughout. Abdominal:     General: Bowel sounds are normal. There is no distension.     Palpations: Abdomen is soft.  Genitourinary:    Exam position: Lithotomy position.     Labia:        Right: No rash.        Left: No rash.   Musculoskeletal:     Cervical back: Normal range of motion and neck supple.     Lumbar back: Tenderness present. No swelling, spasms or bony tenderness. Decreased range of motion. Negative right straight leg raise test and negative left straight leg raise test.     Right lower leg: No edema.     Left lower leg: No edema.  Lymphadenopathy:     Cervical: No cervical adenopathy.  Skin:    General: Skin is warm and dry.  Neurological:     Mental Status: She is alert and oriented to person, place, and time.     Deep Tendon Reflexes: Reflexes are normal and symmetric.     Reflex Scores:      Brachioradialis reflexes are 2+ on the right side and 2+ on the left side.      Patellar reflexes are 2+ on the right side and 2+ on the left side. Psychiatric:        Attention and Perception: Attention normal.        Mood and Affect: Mood normal.        Speech: Speech normal.        Behavior: Behavior normal. Behavior is cooperative.        Thought Content:  Thought content normal.    Results for orders placed or performed in visit on 06/25/22  Bayer DCA Hb A1c Waived  Result Value Ref Range   HB A1C (BAYER DCA - WAIVED) 6.4 (H) 4.8 - 5.6 %  Microalbumin, Urine Waived  Result Value Ref Range   Microalb, Ur Waived 80 (H) 0 - 19 mg/L   Creatinine, Urine Waived 50 10 - 300 mg/dL   Microalb/Creat Ratio >300 (H) <30 mg/g      Assessment & Plan:   Problem List Items  Addressed This Visit       Cardiovascular and Mediastinum   Aortic atherosclerosis ([place])    Ongoing.  Noted on 07/15/20 imaging.  At this time continue statin and Eliquis daily for prevention.        Hypertension associated with diabetes (HCC)    Chronic, stable.  BP at goal in office on recheck and at home.  At this time will continue current regimen and adjust as needed based on BP and HR.  Continue collaboration with cardiology.  Recommend she monitor BP at least a few mornings a week at home and document.  DASH diet at home.  Labs: up to date.  Return in 3 months.       Relevant Orders   Bayer DCA Hb A1c Waived (Completed)   Microalbumin, Urine Waived (Completed)   Mitral regurgitation    Mild mitral and tricuspid regurgitation noted on echo June 2022.  Continue collaboration with cardiology and current medication regimen.      Paroxysmal atrial fibrillation (HCC)    Chronic, ongoing, followed by cardiology.  Continue Eliquis and Metoprolol + collaboration with cardiology team.  Rate controlled.  Continue to collaborate with CCM team.  Recent notes reviewed.        Respiratory   OSA (obstructive sleep apnea)    Chronic, ongoing.  Continue 100% adherence to CPAP use at night.  Will place referral for new sleep study as recommended due patient report CPAP not working appropriately.      Relevant Orders   Ambulatory referral to Sleep Studies     Endocrine   CKD stage 4 due to type 2 diabetes mellitus (HCC)    Chronic, ongoing.  At this time reduce and renal dose medications as needed + continue collaboration with nephrology.  Labs: up to date with nephrology.  Avoid NSAIDs.      Relevant Orders   Bayer DCA Hb A1c Waived (Completed)   Microalbumin, Urine Waived (Completed)   Hyperlipidemia associated with type 2 diabetes mellitus (HCC)    Chronic, ongoing.  Continue current medication regimen and adjust as needed.  Lipid panel in office today.       Relevant Orders    Bayer DCA Hb A1c Waived (Completed)   Lipid Panel w/o Chol/HDL Ratio   Primary hyperparathyroidism (HCC)    Chronic, ongoing.  Continue collaboration with nephrology and current medication regimen. Recent labs performed by them and stable.      Relevant Orders   VITAMIN D 25 Hydroxy (Vit-D Deficiency, Fractures)   Type 2 diabetes mellitus with morbid obesity ([place]) - Primary    Chronic, ongoing with A1c 6.4% today, downward trend and urine ALB 80 January 2024.  Praised for ongoing success. - Continue Jardiance 25 MG and Ozempic 0.5 MG weekly at this time, as seeing significant benefit with this.  Gets via CCM assistance. - Maintain off Glimepiride due to her age and risk for hypoglycemia. Plus maintain off Metformin due to  kidney health. - If poor control once at MAX doses of GLP1 and SGLT2, then will consider referral to endo, but at this time will continue to work on this in office which patient agrees with. - Check BS TID and focus on diabetic diet.    - Return in 3 months for A1c -- but alert provider prior to if elevations in BS >300 or <70.        Relevant Orders   Bayer DCA Hb A1c Waived (Completed)   Microalbumin, Urine Waived (Completed)     Nervous and Auditory   Chronic right-sided low back pain with right-sided sciatica    Chronic with current acute flare.  History of surgeries to back. At this time will provide steroid taper, her diabetes is well-controlled and recommend she monitor sugars with this.  Tizanidine for night time use if needed + Lidocaine patches.  Educated her on these changes.  Recommend continue Tylenol at home and exercises.  If ongoing or worsening get into physiatry or ortho + obtain new imaging.      Relevant Medications   tiZANidine (ZANAFLEX) 2 MG tablet     Genitourinary   Anemia in chronic kidney disease (CKD)    Ongoing.  Labs up to date with nephrology and stable.  Continue this collaboration, recent notes and labs reviewed.      Relevant  Medications   ferrous sulfate 325 (65 FE) MG tablet     Hematopoietic and Hemostatic   Other thrombophilia (HCC)    Related to a-fib and long term Eliquis.  Monitor CBC regularly and monitor skin for bruising or breakdown, notify provider if present.        Other   Depression, major, single episode, moderate (HCC)    Chronic, stable.   Denies SI/HI.  Continue Duloxetine at this time, which also offers benefit to chronic pain and mood, + continue Buspar which has offered her benefit to anxiety.  Refills as needed.  Will adjust dosing or add medication as needed.  Return in 3 months.      Morbid obesity (HCC)    BMI 38.64 with T2DM, HTN, CKD with some more loss noted, praised for this.  Recommended eating smaller high protein, low fat meals more frequently and exercising 30 mins a day 5 times a week with a goal of 10-15lb weight loss in the next 3 months. Patient voiced their understanding and motivation to adhere to these recommendations.       Subacute cough    Ongoing for 2 weeks, was exposed to Covid from husband but denies any treatment for this weeks back.  At this time will start Augmentin BID for 7 days and recommend: - Increased rest - Increasing Fluids - Acetaminophen as needed for fever/pain.  - Salt water gargling, chloraseptic spray and throat lozenges - OTC Corcidin - Mucinex.  - Humidifying the air.  Return if worsening or ongoing and will obtain imaging.        Follow up plan: Return in about 3 months (around 09/24/2022) for T2DM, HTN/HLD, A-FIB, CKD, DEPRESSION.

## 2022-06-25 NOTE — Assessment & Plan Note (Signed)
Chronic, stable.   Denies SI/HI.  Continue Duloxetine at this time, which also offers benefit to chronic pain and mood, + continue Buspar which has offered her benefit to anxiety.  Refills as needed.  Will adjust dosing or add medication as needed.  Return in 3 months.

## 2022-06-25 NOTE — Assessment & Plan Note (Signed)
Chronic with current acute flare.  History of surgeries to back. At this time will provide steroid taper, her diabetes is well-controlled and recommend she monitor sugars with this.  Tizanidine for night time use if needed + Lidocaine patches.  Educated her on these changes.  Recommend continue Tylenol at home and exercises.  If ongoing or worsening get into physiatry or ortho + obtain new imaging.

## 2022-06-25 NOTE — Assessment & Plan Note (Signed)
Chronic, ongoing.  Continue current medication regimen and adjust as needed.  Lipid panel in office today.

## 2022-06-25 NOTE — Assessment & Plan Note (Signed)
Ongoing.  Labs up to date with nephrology and stable.  Continue this collaboration, recent notes and labs reviewed.

## 2022-06-25 NOTE — Assessment & Plan Note (Signed)
Related to a-fib and long term Eliquis.  Monitor CBC regularly and monitor skin for bruising or breakdown, notify provider if present.

## 2022-06-25 NOTE — Assessment & Plan Note (Signed)
Chronic, stable.  BP at goal in office on recheck and at home.  At this time will continue current regimen and adjust as needed based on BP and HR.  Continue collaboration with cardiology.  Recommend she monitor BP at least a few mornings a week at home and document.  DASH diet at home.  Labs: up to date.  Return in 3 months.

## 2022-06-25 NOTE — Assessment & Plan Note (Signed)
Chronic, ongoing, followed by cardiology.  Continue Eliquis and Metoprolol + collaboration with cardiology team.  Rate controlled.  Continue to collaborate with CCM team.  Recent notes reviewed.

## 2022-06-25 NOTE — Assessment & Plan Note (Signed)
Ongoing.  Noted on 07/15/20 imaging.  At this time continue statin and Eliquis daily for prevention.

## 2022-06-25 NOTE — Assessment & Plan Note (Signed)
Ongoing for 2 weeks, was exposed to Covid from husband but denies any treatment for this weeks back.  At this time will start Augmentin BID for 7 days and recommend: - Increased rest - Increasing Fluids - Acetaminophen as needed for fever/pain.  - Salt water gargling, chloraseptic spray and throat lozenges - OTC Corcidin - Mucinex.  - Humidifying the air.  Return if worsening or ongoing and will obtain imaging.

## 2022-06-26 LAB — LIPID PANEL W/O CHOL/HDL RATIO
Cholesterol, Total: 156 mg/dL (ref 100–199)
HDL: 41 mg/dL (ref >39)
LDL Chol Calc (NIH): 95 mg/dL (ref 0–99)
Triglycerides: 110 mg/dL (ref 0–149)
VLDL Cholesterol Cal: 20 mg/dL (ref 5–40)

## 2022-06-26 LAB — VITAMIN D 25 HYDROXY (VIT D DEFICIENCY, FRACTURES): Vit D, 25-Hydroxy: 19.4 ng/mL — ABNORMAL LOW (ref 30.0–100.0)

## 2022-06-26 NOTE — Progress Notes (Signed)
Good morning Tammy, please let Dayna know her labs have returned: - Cholesterol labs stable, continue Atorvastatin daily.   - Vitamin D a little on low side, please ensure you are taking Vitamin D3 2000 units daily, which you can obtain over the counter at any drug store.  Any questions? Keep being amazing!!  Thank you for allowing me to participate in your care.  I appreciate you. Kindest regards, Joelle [place]

## 2022-06-28 ENCOUNTER — Telehealth: Payer: Self-pay

## 2022-06-28 NOTE — Telephone Encounter (Signed)
PA approved for Lidocaine  5% patches, good through 05/31/23

## 2022-06-30 ENCOUNTER — Other Ambulatory Visit: Payer: Self-pay | Admitting: Nurse Practitioner

## 2022-06-30 NOTE — Telephone Encounter (Signed)
Requested medication (s) are due for refill today - provider review   Requested medication (s) are on the active medication list -yes  Future visit scheduled -yes  Last refill: 06/25/22 #30   Notes to clinic: non delegated Rx  Requested Prescriptions  Pending Prescriptions Disp Refills   tiZANidine (ZANAFLEX) 2 MG tablet [Pharmacy Med Name: TIZANIDINE '2MG'$  TABLETS] 30 tablet 0    Sig: TAKE 1 TABLET(2 MG) BY MOUTH EVERY 6 HOURS AS NEEDED FOR MUSCLE SPASMS     Not Delegated - Cardiovascular:  Alpha-2 Agonists - tizanidine Failed - 06/30/2022  3:43 AM      Failed - This refill cannot be delegated      Passed - Valid encounter within last 6 months    Recent Outpatient Visits           5 days ago Type 2 diabetes mellitus with morbid obesity ([place])   [place], [employer] [place] T, NP   3 months ago Type 2 diabetes mellitus with morbid obesity ([place])   [place], [place] T, NP   6 months ago Type 2 diabetes mellitus with morbid obesity ([place])   [place], [place] T, NP   9 months ago Type 2 diabetes mellitus with morbid obesity ([place])   [place], [place] T, NP   11 months ago Type 2 diabetes mellitus with morbid obesity ([place])   [place], Barbaraann Faster, NP       Future Appointments             In 3 months Cannady, Barbaraann Faster, NP [place], [facility]               Requested Prescriptions  Pending Prescriptions Disp Refills   tiZANidine (ZANAFLEX) 2 MG tablet [Pharmacy Med Name: TIZANIDINE '2MG'$  TABLETS] 30 tablet 0    Sig: TAKE 1 TABLET(2 MG) BY MOUTH EVERY 6 HOURS AS NEEDED FOR MUSCLE SPASMS     Not Delegated - Cardiovascular:  Alpha-2 Agonists - tizanidine Failed - 06/30/2022  3:43 AM      Failed - This refill cannot be delegated      Passed - Valid encounter within last 6 months    Recent Outpatient  Visits           5 days ago Type 2 diabetes mellitus with morbid obesity ([place])   [place], [place] T, NP   3 months ago Type 2 diabetes mellitus with morbid obesity ([place])   [place], [place] T, NP   6 months ago Type 2 diabetes mellitus with morbid obesity ([place])   [place], [place] T, NP   9 months ago Type 2 diabetes mellitus with morbid obesity ([place])   [place], [place] T, NP   11 months ago Type 2 diabetes mellitus with morbid obesity ([place])   [place], Barbaraann Faster, NP       Future Appointments             In 3 months Cannady, Barbaraann Faster, NP [place], [facility]

## 2022-07-12 ENCOUNTER — Telehealth: Payer: Self-pay

## 2022-07-12 NOTE — Progress Notes (Cosign Needed)
Spoke with patient today about PAP for Eliquis, Ozempic and Jardiance. Patient stated that she still needs to get co-pays from the pharmacy for Eliquis. She stated that she will do sometime this week and also bring applications to the [place] office to be signed and faxed.  Ethelene Hal

## 2022-07-13 ENCOUNTER — Ambulatory Visit (INDEPENDENT_AMBULATORY_CARE_PROVIDER_SITE_OTHER): Payer: Medicare HMO

## 2022-07-13 ENCOUNTER — Ambulatory Visit: Payer: Self-pay

## 2022-07-13 DIAGNOSIS — E1159 Type 2 diabetes mellitus with other circulatory complications: Secondary | ICD-10-CM

## 2022-07-13 DIAGNOSIS — E1122 Type 2 diabetes mellitus with diabetic chronic kidney disease: Secondary | ICD-10-CM

## 2022-07-13 DIAGNOSIS — I480 Paroxysmal atrial fibrillation: Secondary | ICD-10-CM

## 2022-07-13 NOTE — Chronic Care Management (AMB) (Signed)
Chronic Care Management   CCM RN Visit Note  07/13/2022 Name: Cindy [name] MRN: [id number] DOB: 1951-01-09  Subjective: Cindy [name] is a 72 y.o. year old female who is a primary care patient of Cannady, Barbaraann Faster, NP. The patient was referred to the Chronic Care Management team for assistance with care management needs subsequent to provider initiation of CCM services and plan of care.    Today's Visit:  Engaged with patient by telephone for follow up visit.        Goals Addressed             This Visit's Progress    CCM Expected Outcome:  Monitor, Self-Manage and Reduce Symptoms of Afib       Current Barriers:  Chronic Disease Management support and education needs related to effective management of AFIB  Planned Interventions: Provider order and care plan reviewed. Collaborated with PharmD regarding patient care and plan. The patient is working with the pharm D for assistance for getting Eliquis. She has paperwork to submit and will take by the office.  Counseled on increased risk of stroke due to Afib and benefits of anticoagulation for stroke prevention           Reviewed importance of adherence to anticoagulant exactly as prescribed. The patient is complaint with medications.  Advised patient to discuss changes in her heart rate or AFIB with provider Counseled on bleeding risk associated with AFIB and importance of self-monitoring for signs/symptoms of bleeding Counseled on avoidance of NSAIDs due to increased bleeding risk with anticoagulants Counseled on importance of regular laboratory monitoring as prescribed. Has lab work on a regular basis. The patient is complaint Counseled on seeking medical attention after a head injury or if there is blood in the urine/stool Afib action plan reviewed Screening for signs and symptoms of depression related to chronic disease state Assessed social determinant of health barriers The patient got approved for lidocaine patches  prescribed by the pcp. The patient has them ordered. Instructed the patient to call the pharmacy and ask for the patches to be filled for her. Education and support given.   Symptom Management: Take medications as prescribed   Attend all scheduled provider appointments Call provider office for new concerns or questions  call the Suicide and Crisis Lifeline: 988 call the [place] National Suicide Prevention Lifeline: (351) 194-1189 or TTY: (272)701-9116 TTY 563 250 7022) to talk to a trained counselor call 1-800-273-TALK (toll free, 24 hour hotline) if experiencing a Mental Health or [place]  - make a plan to eat healthy - keep all lab appointments - take medicine as prescribed  Follow Up Plan: Telephone follow up appointment with care management team member scheduled for: 09-07-2022 at 1 pm       CCM Expected Outcome:  Monitor, Self-Manage and Reduce Symptoms of Diabetes       Current Barriers:  Chronic Disease Management support and education needs related to effective management of DM Financial Constraints.  Lab Results  Component Value Date   HGBA1C 6.4 (H) 06/25/2022     Planned Interventions: Provided education to patient about basic DM disease process. Review and education provided. The patient states that she is happy with the way she is managing her DM. She is wanting to lose more weight. Discussed losing weight slowly; Reviewed medications with patient and discussed importance of medication adherence. The patient is working with the pharm D for ongoing support and education and medication management. Has paperwork to submit for medication assistance.  Provided the temporary office address so she can take the paper to the office for submitting to get approval. ;        Reviewed prescribed diet with patient heart healthy/ADA diet. Review and education provided. The patient states that she is watching her portion size; Counseled on importance of regular laboratory  monitoring as prescribed. Has lab work on a regular basis. Review of recent labwork and recommendations by the pcp for Vitamin D3. She takes calcitrol 25 mcg. Encouraged her to pick up an additional 1000 units Vitamin D3.   The patient verbalized understanding.      Discussed plans with patient for ongoing care management follow up and provided patient with direct contact information for care management team;      Provided patient with written educational materials related to hypo and hyperglycemia and importance of correct treatment;       Reviewed scheduled/upcoming provider appointments including: 09-29-2022 at 10 am;         Advised patient, providing education and rationale, to check cbg once daily and when you have symptoms of low or high blood sugar and record. The patient states that her blood sugars have been around 110 to 130. The patient denies any hypoglycemic events       Review of patient status, including review of consultants reports, relevant laboratory and other test results, and medications completed;       Advised patient to discuss changes in DM or questions or concerns with provider;      Screening for signs and symptoms of depression related to chronic disease state;        Assessed social determinant of health barriers;         Symptom Management: Take medications as prescribed   Attend all scheduled provider appointments Call provider office for new concerns or questions  call the Suicide and Crisis Lifeline: 988 call the [place] National Suicide Prevention Lifeline: 3472559800 or TTY: 959-045-3535 TTY 765 033 0473) to talk to a trained counselor call 1-800-273-TALK (toll free, 24 hour hotline) if experiencing a Mental Health or [place]  check feet daily for cuts, sores or redness trim toenails straight across manage portion size wash and dry feet carefully every day wear comfortable, cotton socks wear comfortable, well-fitting shoes  Follow Up Plan:  Telephone follow up appointment with care management team member scheduled for: 09-07-2022 at 1 pm       CCM Expected Outcome:  Monitor, Self-Manage and Reduce Symptoms of: CKD       Current Barriers:  Knowledge Deficits related to importance of kidney protection and monitoring for dehydration or changes in conditions that may impact kidney function Chronic Disease Management support and education needs related to effective management of CKD EGFR at 30  Planned Interventions: Assessed the patient  understanding of chronic kidney disease. The patient has a good understanding of her CKD. She states that she is still getting over the congestion and stuff she saw the pcp for on 06-25-2022. The patient states that she is monitoring. Education on what worse looks like and to call for changes and needs. Will continue to monitor.    Evaluation of current treatment plan related to chronic kidney disease self management and patient's adherence to plan as established by provider      Provided education to patient re: stroke prevention, s/s of heart attack and stroke    Reviewed prescribed diet heart healthy/ADA diet  Reviewed medications with patient and discussed importance of compliance  Advised patient, providing education and rationale, to monitor blood pressure daily and record, calling PCP for findings outside established parameters. Blood pressures are more stable and doing good. The patient states she is feeling better.     Discussed complications of poorly controlled blood pressure such as heart disease, stroke, circulatory complications, vision complications, kidney impairment, sexual dysfunction    Reviewed scheduled/upcoming provider appointments including: 09-29-2022 at 10 am   Advised patient to discuss changes in her kidney function, questions or concerns with provider    Discussed plans with patient for ongoing care management follow up and provided patient with direct contact information for  care management team    Screening for signs and symptoms of depression related to chronic disease state      Discussed the impact of chronic kidney disease on daily life and mental health and acknowledged and normalized feelings of disempowerment, fear, and frustration    Assessed social determinant of health barriers    Provided education on kidney disease progression    Engage patient in early, proactive and ongoing discussion about goals of care and what matters most to them    Support coping and stress management by recognizing current strategies and assist in developing new strategies such as mindfulness, journaling, relaxation techniques, problem-solving     Symptom Management: Take medications as prescribed   Attend all scheduled provider appointments Call provider office for new concerns or questions  call the Suicide and Crisis Lifeline: 988 call the [place] National Suicide Prevention Lifeline: 559-653-4255 or TTY: (434)176-5821 TTY (680) 526-5877) to talk to a trained counselor call 1-800-273-TALK (toll free, 24 hour hotline) if experiencing a Mental Health or [place]   Follow Up Plan: Telephone follow up appointment with care management team member scheduled for: 09-07-2022 at 1 pm       CCM Expected Outcome:  Monitor, Self-Manage, and Reduce Symptoms of Hypertension       Current Barriers:  Chronic Disease Management support and education needs related to effective management of HTN BP Readings from Last 3 Encounters:  06/25/22 138/69  04/05/22 (!) 155/64  03/23/22 117/70     Planned Interventions: Evaluation of current treatment plan related to hypertension self management and patient's adherence to plan as established by provider. The patient states that her blood pressures are stable and she is following the plan of care. Denies any elevations in her blood pressures;   Provided education to patient re: stroke prevention, s/s of heart attack and  stroke; Reviewed prescribed diet heart healthy/ADA diet. Education provided on heart healthy/ADA diet. The patient wants to lose weight. She is watching her dietary intake and has a goal of getting to 150 pounds.  Currently her weight is 204. Reviewed medications with patient and discussed importance of compliance. The patient is compliant with medications. Working with the pharm D for management of medications and PAP assistance;  Discussed plans with patient for ongoing care management follow up and provided patient with direct contact information for care management team; Advised patient, providing education and rationale, to monitor blood pressure daily and record, calling PCP for findings outside established parameters;  Reviewed scheduled/upcoming provider appointments including: 09-29-2022 at 10 am,  Advised patient to discuss changes in blood pressure or heart health with provider; Provided education on prescribed diet heart healthy/ADA diet ;  Discussed complications of poorly controlled blood pressure such as heart disease, stroke, circulatory complications, vision complications, kidney impairment, sexual dysfunction;  Screening for signs and symptoms of depression related to chronic  disease state;  Assessed social determinant of health barriers;   Symptom Management: Take medications as prescribed   Attend all scheduled provider appointments Call provider office for new concerns or questions  call the Suicide and Crisis Lifeline: 988 call the [place] National Suicide Prevention Lifeline: (539)058-5807 or TTY: 3023232851 TTY 240-128-1625) to talk to a trained counselor call 1-800-273-TALK (toll free, 24 hour hotline) if experiencing a Mental Health or [place]  check blood pressure weekly call doctor for signs and symptoms of high blood pressure develop an action plan for high blood pressure keep all doctor appointments take medications for blood pressure exactly as  prescribed report new symptoms to your doctor  Follow Up Plan: Telephone follow up appointment with care management team member scheduled for: 09-07-2022 at 1 pm          Plan:Telephone follow up appointment with care management team member scheduled for:  09-07-2022 at 1 pm  Noreene Larsson RN, MSN, CCM RN Care Manager  Chronic Care Management Direct Number: [phone]

## 2022-07-13 NOTE — Patient Instructions (Signed)
Please call the care guide team at [phone] if you need to cancel or reschedule your appointment.   If you are experiencing a Mental Health or [place] or need someone to talk to, please call the Suicide and Crisis Lifeline: 988 call the [place] National Suicide Prevention Lifeline: 7257882313 or TTY: (508) 327-2060 TTY 2092711606) to talk to a trained counselor call 1-800-273-TALK (toll free, 24 hour hotline)   Following is a copy of the CCM Program Consent:  CCM service includes personalized support from designated clinical staff supervised by the physician, including individualized plan of care and coordination with other care providers 24/7 contact phone numbers for assistance for urgent and routine care needs. Service will only be billed when office clinical staff spend 20 minutes or more in a month to coordinate care. Only one practitioner may furnish and bill the service in a calendar month. The patient may stop CCM services at amy time (effective at the end of the month) by phone call to the office staff. The patient will be responsible for cost sharing (co-pay) or up to 20% of the service fee (after annual deductible is met)  Following is a copy of your full provider care plan:   Goals Addressed             This Visit's Progress    CCM Expected Outcome:  Monitor, Self-Manage and Reduce Symptoms of Afib       Current Barriers:  Chronic Disease Management support and education needs related to effective management of AFIB  Planned Interventions: Provider order and care plan reviewed. Collaborated with PharmD regarding patient care and plan. The patient is working with the pharm D for assistance for getting Eliquis. She has paperwork to submit and will take by the office.  Counseled on increased risk of stroke due to Afib and benefits of anticoagulation for stroke prevention           Reviewed importance of adherence to anticoagulant exactly as prescribed. The  patient is complaint with medications.  Advised patient to discuss changes in her heart rate or AFIB with provider Counseled on bleeding risk associated with AFIB and importance of self-monitoring for signs/symptoms of bleeding Counseled on avoidance of NSAIDs due to increased bleeding risk with anticoagulants Counseled on importance of regular laboratory monitoring as prescribed. Has lab work on a regular basis. The patient is complaint Counseled on seeking medical attention after a head injury or if there is blood in the urine/stool Afib action plan reviewed Screening for signs and symptoms of depression related to chronic disease state Assessed social determinant of health barriers The patient got approved for lidocaine patches prescribed by the pcp. The patient has them ordered. Instructed the patient to call the pharmacy and ask for the patches to be filled for her. Education and support given.   Symptom Management: Take medications as prescribed   Attend all scheduled provider appointments Call provider office for new concerns or questions  call the Suicide and Crisis Lifeline: 988 call the [place] National Suicide Prevention Lifeline: 416-707-5228 or TTY: (747)086-1876 TTY (938) 418-1702) to talk to a trained counselor call 1-800-273-TALK (toll free, 24 hour hotline) if experiencing a Mental Health or [place]  - make a plan to eat healthy - keep all lab appointments - take medicine as prescribed  Follow Up Plan: Telephone follow up appointment with care management team member scheduled for: 09-07-2022 at 1 pm       CCM Expected Outcome:  Monitor, Self-Manage and Reduce  Symptoms of Diabetes       Current Barriers:  Chronic Disease Management support and education needs related to effective management of DM Financial Constraints.  Lab Results  Component Value Date   HGBA1C 6.4 (H) 06/25/2022     Planned Interventions: Provided education to patient about basic DM  disease process. Review and education provided. The patient states that she is happy with the way she is managing her DM. She is wanting to lose more weight. Discussed losing weight slowly; Reviewed medications with patient and discussed importance of medication adherence. The patient is working with the pharm D for ongoing support and education and medication management. Has paperwork to submit for medication assistance. Provided the temporary office address so she can take the paper to the office for submitting to get approval. ;        Reviewed prescribed diet with patient heart healthy/ADA diet. Review and education provided. The patient states that she is watching her portion size; Counseled on importance of regular laboratory monitoring as prescribed. Has lab work on a regular basis. Review of recent labwork and recommendations by the pcp for Vitamin D3. She takes calcitrol 25 mcg. Encouraged her to pick up an additional 1000 units Vitamin D3.   The patient verbalized understanding.      Discussed plans with patient for ongoing care management follow up and provided patient with direct contact information for care management team;      Provided patient with written educational materials related to hypo and hyperglycemia and importance of correct treatment;       Reviewed scheduled/upcoming provider appointments including: 09-29-2022 at 10 am;         Advised patient, providing education and rationale, to check cbg once daily and when you have symptoms of low or high blood sugar and record. The patient states that her blood sugars have been around 110 to 130. The patient denies any hypoglycemic events       Review of patient status, including review of consultants reports, relevant laboratory and other test results, and medications completed;       Advised patient to discuss changes in DM or questions or concerns with provider;      Screening for signs and symptoms of depression related to chronic  disease state;        Assessed social determinant of health barriers;         Symptom Management: Take medications as prescribed   Attend all scheduled provider appointments Call provider office for new concerns or questions  call the Suicide and Crisis Lifeline: 988 call the [place] National Suicide Prevention Lifeline: (514)260-4943 or TTY: 7012244743 TTY 804-647-3880) to talk to a trained counselor call 1-800-273-TALK (toll free, 24 hour hotline) if experiencing a Mental Health or [place]  check feet daily for cuts, sores or redness trim toenails straight across manage portion size wash and dry feet carefully every day wear comfortable, cotton socks wear comfortable, well-fitting shoes  Follow Up Plan: Telephone follow up appointment with care management team member scheduled for: 09-07-2022 at 1 pm       CCM Expected Outcome:  Monitor, Self-Manage and Reduce Symptoms of: CKD       Current Barriers:  Knowledge Deficits related to importance of kidney protection and monitoring for dehydration or changes in conditions that may impact kidney function Chronic Disease Management support and education needs related to effective management of CKD EGFR at 30  Planned Interventions: Assessed the patient  understanding of chronic  kidney disease. The patient has a good understanding of her CKD. She states that she is still getting over the congestion and stuff she saw the pcp for on 06-25-2022. The patient states that she is monitoring. Education on what worse looks like and to call for changes and needs. Will continue to monitor.    Evaluation of current treatment plan related to chronic kidney disease self management and patient's adherence to plan as established by provider      Provided education to patient re: stroke prevention, s/s of heart attack and stroke    Reviewed prescribed diet heart healthy/ADA diet  Reviewed medications with patient and discussed importance of  compliance    Advised patient, providing education and rationale, to monitor blood pressure daily and record, calling PCP for findings outside established parameters. Blood pressures are more stable and doing good. The patient states she is feeling better.     Discussed complications of poorly controlled blood pressure such as heart disease, stroke, circulatory complications, vision complications, kidney impairment, sexual dysfunction    Reviewed scheduled/upcoming provider appointments including: 09-29-2022 at 10 am   Advised patient to discuss changes in her kidney function, questions or concerns with provider    Discussed plans with patient for ongoing care management follow up and provided patient with direct contact information for care management team    Screening for signs and symptoms of depression related to chronic disease state      Discussed the impact of chronic kidney disease on daily life and mental health and acknowledged and normalized feelings of disempowerment, fear, and frustration    Assessed social determinant of health barriers    Provided education on kidney disease progression    Engage patient in early, proactive and ongoing discussion about goals of care and what matters most to them    Support coping and stress management by recognizing current strategies and assist in developing new strategies such as mindfulness, journaling, relaxation techniques, problem-solving     Symptom Management: Take medications as prescribed   Attend all scheduled provider appointments Call provider office for new concerns or questions  call the Suicide and Crisis Lifeline: 988 call the [place] National Suicide Prevention Lifeline: (236)753-5797 or TTY: 318-637-7227 TTY 727 306 2104) to talk to a trained counselor call 1-800-273-TALK (toll free, 24 hour hotline) if experiencing a Mental Health or [place]   Follow Up Plan: Telephone follow up appointment with care management  team member scheduled for: 09-07-2022 at 1 pm       CCM Expected Outcome:  Monitor, Self-Manage, and Reduce Symptoms of Hypertension       Current Barriers:  Chronic Disease Management support and education needs related to effective management of HTN BP Readings from Last 3 Encounters:  06/25/22 138/69  04/05/22 (!) 155/64  03/23/22 117/70     Planned Interventions: Evaluation of current treatment plan related to hypertension self management and patient's adherence to plan as established by provider. The patient states that her blood pressures are stable and she is following the plan of care. Denies any elevations in her blood pressures;   Provided education to patient re: stroke prevention, s/s of heart attack and stroke; Reviewed prescribed diet heart healthy/ADA diet. Education provided on heart healthy/ADA diet. The patient wants to lose weight. She is watching her dietary intake and has a goal of getting to 150 pounds.  Currently her weight is 204. Reviewed medications with patient and discussed importance of compliance. The patient is compliant with medications. Working  with the pharm D for management of medications and PAP assistance;  Discussed plans with patient for ongoing care management follow up and provided patient with direct contact information for care management team; Advised patient, providing education and rationale, to monitor blood pressure daily and record, calling PCP for findings outside established parameters;  Reviewed scheduled/upcoming provider appointments including: 09-29-2022 at 10 am,  Advised patient to discuss changes in blood pressure or heart health with provider; Provided education on prescribed diet heart healthy/ADA diet ;  Discussed complications of poorly controlled blood pressure such as heart disease, stroke, circulatory complications, vision complications, kidney impairment, sexual dysfunction;  Screening for signs and symptoms of depression related to  chronic disease state;  Assessed social determinant of health barriers;   Symptom Management: Take medications as prescribed   Attend all scheduled provider appointments Call provider office for new concerns or questions  call the Suicide and Crisis Lifeline: 988 call the [place] National Suicide Prevention Lifeline: (610)179-8099 or TTY: 229-740-1162 TTY (414)808-4995) to talk to a trained counselor call 1-800-273-TALK (toll free, 24 hour hotline) if experiencing a Mental Health or [place]  check blood pressure weekly call doctor for signs and symptoms of high blood pressure develop an action plan for high blood pressure keep all doctor appointments take medications for blood pressure exactly as prescribed report new symptoms to your doctor  Follow Up Plan: Telephone follow up appointment with care management team member scheduled for: 09-07-2022 at 1 pm          The patient verbalized understanding of instructions, educational materials, and care plan provided today and DECLINED offer to receive copy of patient instructions, educational materials, and care plan.   Telephone follow up appointment with care management team member scheduled for: 09-07-2022 at 1 pm

## 2022-07-13 NOTE — Chronic Care Management (AMB) (Signed)
Error in charting. See new encounter

## 2022-07-15 ENCOUNTER — Telehealth: Payer: Self-pay | Admitting: Nurse Practitioner

## 2022-07-15 NOTE — Telephone Encounter (Signed)
Patient dropped off patient assistance application and financial paperwork. Paperwork was placed in the provider folder for review.

## 2022-07-16 NOTE — Telephone Encounter (Signed)
Paperwork placed in [employer] folder for signature

## 2022-07-19 NOTE — Telephone Encounter (Signed)
Paperwork place in Regulatory affairs officer

## 2022-07-21 ENCOUNTER — Telehealth: Payer: Self-pay

## 2022-07-21 NOTE — Telephone Encounter (Signed)
PA for Lidocaine 5% patches have been approved through 05/31/23

## 2022-07-22 ENCOUNTER — Telehealth: Payer: Self-pay

## 2022-07-22 NOTE — Telephone Encounter (Signed)
[employer] Patient Asst. Has been approved for Eliquis frome 07/22/22 through 05/30/26

## 2022-07-22 NOTE — Progress Notes (Cosign Needed)
Care Management & Coordination Services Pharmacy Team  Reason for Encounter: Patient assistance renewal   Patient is currently enrolled in [employer] patient assistance program for the medication Jardiance, start date 07/22/22 until date: 05/31/23 Patients first shipment to go out today, may take 7-10 business days for delivery.  Spoke with patient and made her aware that her application for Vania Rea has been approved, and shipment sent out today.  Ethelene Hal

## 2022-07-27 ENCOUNTER — Telehealth: Payer: Self-pay | Admitting: Nurse Practitioner

## 2022-07-27 ENCOUNTER — Telehealth: Payer: Self-pay

## 2022-07-27 NOTE — Telephone Encounter (Signed)
   CCM RN Visit Note   07-27-2022 Name: Cindy [name] MRN: [id number]      DOB: July 20, 1950  Subjective: Cindy [name] is a 72 y.o. year old female who is a primary care patient of Cannady, Barbaraann Faster, NP. The patient was referred to the Chronic Care Management team for assistance with care management needs subsequent to provider initiation of CCM services and plan of care.      Today's Visit:  The patient left a VM for the [facility] asking for help with a letter she received from [employer]  for  paperwork and medication needs .     The patient called after hours on 07-26-2022 and left a message stating she got a letter from [employer] and they need her Medicare card front and back to be sent to them so they can process her application for help with medications. Will collaborate with office staff to get this sent to [employer] so the patients application can be processed.  Secure message sent to the pcp, pharm D, and staff.   Plan:The care management team will reach out to the patient again over the next 30 days.  Noreene Larsson RN, MSN, CCM RN Care Manager  Chronic Care Management Direct Number: [phone]

## 2022-07-27 NOTE — Telephone Encounter (Signed)
Form placed in providers folder for signature

## 2022-07-27 NOTE — Telephone Encounter (Signed)
PA for lidocaine 5% patches has been approved from 05/31/22 through 05/31/23

## 2022-07-27 NOTE — Telephone Encounter (Signed)
Pt is calling to see if PCP can call her in some test strips for her Accucheck machine to check her blood sugar. Per pt is is about to run out and PCP has never called the test strips in for her.  Pt states the below numbers are on her test strips box:  Lot# [id number] GITIN# [id number] CAT# [id number]    Please advise.   [facility] [id number] - [address], [address] AT [place] Phone: [phone]  Fax: [phone]

## 2022-07-27 NOTE — Progress Notes (Cosign Needed)
Just spoke with [employer] representative they did receive patient insurance card, they processed the application and it was approved through 05/31/23.  Called patient to leave message but line was busy.  Cindy [name]

## 2022-07-28 ENCOUNTER — Telehealth: Payer: Self-pay

## 2022-07-28 NOTE — Telephone Encounter (Signed)
Copied from [facility] [id number]. Topic: General - Other >> Jul 27, 2022 10:23 AM Oley Balm A wrote: Reason for CRM: Pt states that [employer] sent her a letter stating that they need the front and back of her medicare card sent to them for her to be able to get her medication. Also pt states that she has not gotten her empagliflozin (JARDIANCE) 25 MG TABS tablet as well.  Pt would like to speak with Pam.

## 2022-07-28 NOTE — Telephone Encounter (Signed)
Paperwork faxed to pharmacy

## 2022-07-28 NOTE — Telephone Encounter (Signed)
Paperwork faxed back to [employer]

## 2022-07-28 NOTE — Telephone Encounter (Signed)
Called patient and let her know that her application was processed and approved through 05/31/2023.  She verbalized her understanding but was concerned about Trul[place].  I told her if they need anything else from her regarding the application they will contact her.

## 2022-07-29 ENCOUNTER — Ambulatory Visit: Payer: Self-pay

## 2022-07-29 ENCOUNTER — Telehealth: Payer: Self-pay

## 2022-07-29 DIAGNOSIS — N184 Chronic kidney disease, stage 4 (severe): Secondary | ICD-10-CM

## 2022-07-29 DIAGNOSIS — I129 Hypertensive chronic kidney disease with stage 1 through stage 4 chronic kidney disease, or unspecified chronic kidney disease: Secondary | ICD-10-CM | POA: Diagnosis not present

## 2022-07-29 DIAGNOSIS — F17290 Nicotine dependence, other tobacco product, uncomplicated: Secondary | ICD-10-CM

## 2022-07-29 DIAGNOSIS — E1122 Type 2 diabetes mellitus with diabetic chronic kidney disease: Secondary | ICD-10-CM

## 2022-07-29 DIAGNOSIS — I4891 Unspecified atrial fibrillation: Secondary | ICD-10-CM

## 2022-07-29 DIAGNOSIS — E1169 Type 2 diabetes mellitus with other specified complication: Secondary | ICD-10-CM

## 2022-07-29 DIAGNOSIS — I480 Paroxysmal atrial fibrillation: Secondary | ICD-10-CM

## 2022-07-29 NOTE — Chronic Care Management (AMB) (Signed)
Chronic Care Management   CCM RN Visit Note  07/29/2022 Name: Cindy [name] MRN: [id number] DOB: Oct 09, 1950  Subjective: Cindy [name] is a 72 y.o. year old female who is a primary care patient of Cannady, Barbaraann Faster, NP. The patient was referred to the Chronic Care Management team for assistance with care management needs subsequent to provider initiation of CCM services and plan of care.    Today's Visit:  Engaged with patient by telephone for follow up visit.        Goals Addressed             This Visit's Progress    CCM Expected Outcome:  Monitor, Self-Manage and Reduce Symptoms of Afib       Current Barriers:  Chronic Disease Management support and education needs related to effective management of AFIB  Planned Interventions: Provider order and care plan reviewed. Collaborated with PharmD regarding patient care and plan. The patient is working with the pharm D for assistance for getting Eliquis. The patient states she has been approved for the Eliquis and is thankful that she has this is concerned about other medications that she needs and has not heard if she is approved or not. Education provided and will talk to the pharm D and pcp.  Counseled on increased risk of stroke due to Afib and benefits of anticoagulation for stroke prevention           Reviewed importance of adherence to anticoagulant exactly as prescribed. The patient is complaint with medications.  Advised patient to discuss changes in her heart rate or AFIB with provider Counseled on bleeding risk associated with AFIB and importance of self-monitoring for signs/symptoms of bleeding Counseled on avoidance of NSAIDs due to increased bleeding risk with anticoagulants Counseled on importance of regular laboratory monitoring as prescribed. Has lab work on a regular basis. The patient is complaint Counseled on seeking medical attention after a head injury or if there is blood in the urine/stool Afib action plan  reviewed Screening for signs and symptoms of depression related to chronic disease state Assessed social determinant of health barriers The patient got approved for lidocaine patches prescribed by the pcp. The patient has them ordered. Instructed the patient to call the pharmacy and ask for the patches to be filled for her. Education and support given.   Symptom Management: Take medications as prescribed   Attend all scheduled provider appointments Call provider office for new concerns or questions  call the Suicide and Crisis Lifeline: 988 call the [place] National Suicide Prevention Lifeline: 682-263-5689 or TTY: (205)170-9557 TTY 743-137-4189) to talk to a trained counselor call 1-800-273-TALK (toll free, 24 hour hotline) if experiencing a Mental Health or [place]  - make a plan to eat healthy - keep all lab appointments - take medicine as prescribed  Follow Up Plan: Telephone follow up appointment with care management team member scheduled for: 09-07-2022 at 1 pm       CCM Expected Outcome:  Monitor, Self-Manage and Reduce Symptoms of Diabetes       Current Barriers:  Chronic Disease Management support and education needs related to effective management of DM Financial Constraints.  Lab Results  Component Value Date   HGBA1C 6.4 (H) 06/25/2022     Planned Interventions: Provided education to patient about basic DM disease process. Review and education provided. The patient states that she is happy with the way she is managing her DM. She is wanting to lose more weight. Discussed losing  weight slowly; Reviewed medications with patient and discussed importance of medication adherence. The patient is working with the pharm D for ongoing support and education and medication management. Has paperwork to submit for medication assistance. The patient had ask for a call back from the [facility]. She states that she got approved for Eliquis but has not heard from the [facility] and  she is out. She said they have not processed the [employer] because they needed her Medicare card front and back. Message sent to the office concerning this. Will collaborate with pharm D and pcp concerning the needs expressed by the patient.  Reviewed prescribed diet with patient heart healthy/ADA diet. Review and education provided. The patient states that she is watching her portion size; Counseled on importance of regular laboratory monitoring as prescribed. Has lab work on a regular basis. Review of recent labwork and recommendations by the pcp for Vitamin D3. She takes calcitrol 25 mcg. Encouraged her to pick up an additional 1000 units Vitamin D3.   The patient verbalized understanding.      Discussed plans with patient for ongoing care management follow up and provided patient with direct contact information for care management team;      Provided patient with written educational materials related to hypo and hyperglycemia and importance of correct treatment;       Reviewed scheduled/upcoming provider appointments including: 09-29-2022 at 10 am;         Advised patient, providing education and rationale, to check cbg once daily and when you have symptoms of low or high blood sugar and record. The patient states that her blood sugars have been around 110 to 130. The patient denies any hypoglycemic events       Review of patient status, including review of consultants reports, relevant laboratory and other test results, and medications completed;       Advised patient to discuss changes in DM or questions or concerns with provider;      Screening for signs and symptoms of depression related to chronic disease state;        Assessed social determinant of health barriers;         Symptom Management: Take medications as prescribed   Attend all scheduled provider appointments Call provider office for new concerns or questions  call the Suicide and Crisis Lifeline: 988 call the [place] National Suicide  Prevention Lifeline: (302) 744-1190 or TTY: 361-592-9574 TTY 847-650-0377) to talk to a trained counselor call 1-800-273-TALK (toll free, 24 hour hotline) if experiencing a Mental Health or [place]  check feet daily for cuts, sores or redness trim toenails straight across manage portion size wash and dry feet carefully every day wear comfortable, cotton socks wear comfortable, well-fitting shoes  Follow Up Plan: Telephone follow up appointment with care management team member scheduled for: 09-07-2022 at 1 pm          Plan:Telephone follow up appointment with care management team member scheduled for:  09-07-2022 at 27 pm  Leon, MSN, CCM RN Care Manager  Chronic Care Management Direct Number: [phone]

## 2022-07-29 NOTE — Progress Notes (Cosign Needed)
Care Management & Coordination Services Pharmacy Team  Reason for Encounter: Patient assistance renewal   Patient is currently enrolled in [employer] patient assistance program for the medication Eliquis. Patient was approved on 07/22/22 until date: 05/31/23.  Shipment went out on 07/22/22, and was delivered on 07/24/22. Called patient to see if medication was delivered, got busy signal.  Ethelene Hal

## 2022-07-29 NOTE — Patient Instructions (Signed)
Please call the care guide team at [phone] if you need to cancel or reschedule your appointment.   If you are experiencing a Mental Health or [place] or need someone to talk to, please call the Suicide and Crisis Lifeline: 988 call the [place] National Suicide Prevention Lifeline: 267-612-7447 or TTY: 782-016-4375 TTY (413) 073-7450) to talk to a trained counselor call 1-800-273-TALK (toll free, 24 hour hotline)   Following is a copy of the CCM Program Consent:  CCM service includes personalized support from designated clinical staff supervised by the physician, including individualized plan of care and coordination with other care providers 24/7 contact phone numbers for assistance for urgent and routine care needs. Service will only be billed when office clinical staff spend 20 minutes or more in a month to coordinate care. Only one practitioner may furnish and bill the service in a calendar month. The patient may stop CCM services at amy time (effective at the end of the month) by phone call to the office staff. The patient will be responsible for cost sharing (co-pay) or up to 20% of the service fee (after annual deductible is met)  Following is a copy of your full provider care plan:   Goals Addressed             This Visit's Progress    CCM Expected Outcome:  Monitor, Self-Manage and Reduce Symptoms of Afib       Current Barriers:  Chronic Disease Management support and education needs related to effective management of AFIB  Planned Interventions: Provider order and care plan reviewed. Collaborated with PharmD regarding patient care and plan. The patient is working with the pharm D for assistance for getting Eliquis. The patient states she has been approved for the Eliquis and is thankful that she has this is concerned about other medications that she needs and has not heard if she is approved or not. Education provided and will talk to the pharm D and pcp.   Counseled on increased risk of stroke due to Afib and benefits of anticoagulation for stroke prevention           Reviewed importance of adherence to anticoagulant exactly as prescribed. The patient is complaint with medications.  Advised patient to discuss changes in her heart rate or AFIB with provider Counseled on bleeding risk associated with AFIB and importance of self-monitoring for signs/symptoms of bleeding Counseled on avoidance of NSAIDs due to increased bleeding risk with anticoagulants Counseled on importance of regular laboratory monitoring as prescribed. Has lab work on a regular basis. The patient is complaint Counseled on seeking medical attention after a head injury or if there is blood in the urine/stool Afib action plan reviewed Screening for signs and symptoms of depression related to chronic disease state Assessed social determinant of health barriers The patient got approved for lidocaine patches prescribed by the pcp. The patient has them ordered. Instructed the patient to call the pharmacy and ask for the patches to be filled for her. Education and support given.   Symptom Management: Take medications as prescribed   Attend all scheduled provider appointments Call provider office for new concerns or questions  call the Suicide and Crisis Lifeline: 988 call the [place] National Suicide Prevention Lifeline: 385-719-6245 or TTY: (813) 665-7607 TTY (304)754-8132) to talk to a trained counselor call 1-800-273-TALK (toll free, 24 hour hotline) if experiencing a Mental Health or [place]  - make a plan to eat healthy - keep all lab appointments - take medicine  as prescribed  Follow Up Plan: Telephone follow up appointment with care management team member scheduled for: 09-07-2022 at 1 pm       CCM Expected Outcome:  Monitor, Self-Manage and Reduce Symptoms of Diabetes       Current Barriers:  Chronic Disease Management support and education needs related to  effective management of DM Financial Constraints.  Lab Results  Component Value Date   HGBA1C 6.4 (H) 06/25/2022     Planned Interventions: Provided education to patient about basic DM disease process. Review and education provided. The patient states that she is happy with the way she is managing her DM. She is wanting to lose more weight. Discussed losing weight slowly; Reviewed medications with patient and discussed importance of medication adherence. The patient is working with the pharm D for ongoing support and education and medication management. Has paperwork to submit for medication assistance. The patient had ask for a call back from the [facility]. She states that she got approved for Eliquis but has not heard from the [place] and she is out. She said they have not processed the Ozempic because they needed her Medicare card front and back. Message sent to the office concerning this. Will collaborate with pharm D and pcp concerning the needs expressed by the patient.  Reviewed prescribed diet with patient heart healthy/ADA diet. Review and education provided. The patient states that she is watching her portion size; Counseled on importance of regular laboratory monitoring as prescribed. Has lab work on a regular basis. Review of recent labwork and recommendations by the pcp for Vitamin D3. She takes calcitrol 25 mcg. Encouraged her to pick up an additional 1000 units Vitamin D3.   The patient verbalized understanding.      Discussed plans with patient for ongoing care management follow up and provided patient with direct contact information for care management team;      Provided patient with written educational materials related to hypo and hyperglycemia and importance of correct treatment;       Reviewed scheduled/upcoming provider appointments including: 09-29-2022 at 10 am;         Advised patient, providing education and rationale, to check cbg once daily and when you have symptoms of low or  high blood sugar and record. The patient states that her blood sugars have been around 110 to 130. The patient denies any hypoglycemic events       Review of patient status, including review of consultants reports, relevant laboratory and other test results, and medications completed;       Advised patient to discuss changes in DM or questions or concerns with provider;      Screening for signs and symptoms of depression related to chronic disease state;        Assessed social determinant of health barriers;         Symptom Management: Take medications as prescribed   Attend all scheduled provider appointments Call provider office for new concerns or questions  call the Suicide and Crisis Lifeline: 988 call the [place] National Suicide Prevention Lifeline: 479-090-9823 or TTY: 559-106-3154 TTY 747-655-1798) to talk to a trained counselor call 1-800-273-TALK (toll free, 24 hour hotline) if experiencing a Mental Health or [place]  check feet daily for cuts, sores or redness trim toenails straight across manage portion size wash and dry feet carefully every day wear comfortable, cotton socks wear comfortable, well-fitting shoes  Follow Up Plan: Telephone follow up appointment with care management team member scheduled for:  09-07-2022 at 1 pm          The patient verbalized understanding of instructions, educational materials, and care plan provided today and DECLINED offer to receive copy of patient instructions, educational materials, and care plan.   Telephone follow up appointment with care management team member scheduled for: 09-07-2022 at 1 pm

## 2022-08-03 ENCOUNTER — Telehealth: Payer: Self-pay

## 2022-08-03 NOTE — Telephone Encounter (Signed)
5 boxes of Ozempic received for the patient. Called and notified her that it was ready to be picked up.

## 2022-08-05 NOTE — Telephone Encounter (Signed)
Pt came by and picked up her 5 boxes of Ozempic.

## 2022-08-10 DIAGNOSIS — E1122 Type 2 diabetes mellitus with diabetic chronic kidney disease: Secondary | ICD-10-CM | POA: Diagnosis not present

## 2022-08-10 DIAGNOSIS — D631 Anemia in chronic kidney disease: Secondary | ICD-10-CM | POA: Diagnosis not present

## 2022-08-10 DIAGNOSIS — N2581 Secondary hyperparathyroidism of renal origin: Secondary | ICD-10-CM | POA: Diagnosis not present

## 2022-08-10 DIAGNOSIS — I10 Essential (primary) hypertension: Secondary | ICD-10-CM | POA: Diagnosis not present

## 2022-08-10 DIAGNOSIS — N1832 Chronic kidney disease, stage 3b: Secondary | ICD-10-CM | POA: Diagnosis not present

## 2022-08-18 DIAGNOSIS — R0609 Other forms of dyspnea: Secondary | ICD-10-CM | POA: Diagnosis not present

## 2022-08-18 DIAGNOSIS — R0602 Shortness of breath: Secondary | ICD-10-CM | POA: Diagnosis not present

## 2022-09-07 ENCOUNTER — Telehealth: Payer: Medicare HMO

## 2022-09-07 ENCOUNTER — Ambulatory Visit (INDEPENDENT_AMBULATORY_CARE_PROVIDER_SITE_OTHER): Payer: Medicare HMO

## 2022-09-07 DIAGNOSIS — I152 Hypertension secondary to endocrine disorders: Secondary | ICD-10-CM

## 2022-09-07 DIAGNOSIS — E1122 Type 2 diabetes mellitus with diabetic chronic kidney disease: Secondary | ICD-10-CM

## 2022-09-07 DIAGNOSIS — I480 Paroxysmal atrial fibrillation: Secondary | ICD-10-CM

## 2022-09-07 NOTE — Patient Instructions (Signed)
Please call the care guide team at [phone] if you need to cancel or reschedule your appointment.   If you are experiencing a Mental Health or Behavioral Health Crisis or need someone to talk to, please call the Suicide and Crisis Lifeline: 988 call the [place] National Suicide Prevention Lifeline: (708)749-25481-8477452168 or TTY: 31738544761-800-799-4 TTY 443-284-3699(1-(669) [phone]) to talk to a trained counselor call 1-800-273-TALK (toll free, 24 hour hotline)   Following is a copy of the CCM Program Consent:  CCM service includes personalized support from designated clinical staff supervised by the physician, including individualized plan of care and coordination with other care providers 24/7 contact phone numbers for assistance for urgent and routine care needs. Service will only be billed when office clinical staff spend 20 minutes or more in a month to coordinate care. Only one practitioner may furnish and bill the service in a calendar month. The patient may stop CCM services at amy time (effective at the end of the month) by phone call to the office staff. The patient will be responsible for cost sharing (co-pay) or up to 20% of the service fee (after annual deductible is met)  Following is a copy of your full provider care plan:   Goals Addressed             This Visit's Progress    CCM Expected Outcome:  Monitor, Self-Manage and Reduce Symptoms of Afib       Current Barriers:  Chronic Disease Management support and education needs related to effective management of AFIB  Planned Interventions: Provider order and care plan reviewed. Collaborated with PharmD regarding patient care and plan. The patient is working with the pharm D for assistance for getting Eliquis. The patient states she has been approved for the Eliquis and is thankful that she has this is concerned about other medications that she needs and has not heard if she is approved or not. Education provided and will talk to the pharm D and pcp.   Counseled on increased risk of stroke due to Afib and benefits of anticoagulation for stroke prevention. Denies any safety concerns or new issues related to increased blood pressures or changes that put her at increase risk of HA or stroke           Reviewed importance of adherence to anticoagulant exactly as prescribed. The patient is complaint with medications. Is getting Eliquis and taking as prescribed.  Advised patient to discuss changes in her heart rate or AFIB with provider Counseled on bleeding risk associated with AFIB and importance of self-monitoring for signs/symptoms of bleeding Counseled on avoidance of NSAIDs due to increased bleeding risk with anticoagulants Counseled on importance of regular laboratory monitoring as prescribed. Has lab work on a regular basis. The patient is complaint Counseled on seeking medical attention after a head injury or if there is blood in the urine/stool Afib action plan reviewed Screening for signs and symptoms of depression related to chronic disease state Assessed social determinant of health barriers The patient got approved for lidocaine patches prescribed by the pcp. The patient has them ordered. Instructed the patient to call the pharmacy and ask for the patches to be filled for her. Education and support given.   Symptom Management: Take medications as prescribed   Attend all scheduled provider appointments Call provider office for new concerns or questions  call the Suicide and Crisis Lifeline: 988 call the [place] National Suicide Prevention Lifeline: 778-195-98181-8477452168 or TTY: 808-160-85161-800-799-4 TTY (970) 394-2922([phone]) [phone]) to talk to a trained counselor call  1-800-273-TALK (toll free, 24 hour hotline) if experiencing a Mental Health or Behavioral Health Crisis  - make a plan to eat healthy - keep all lab appointments - take medicine as prescribed  Follow Up Plan: Telephone follow up appointment with care management team member scheduled for: 11-03-2022 at 1  pm       CCM Expected Outcome:  Monitor, Self-Manage and Reduce Symptoms of Diabetes       Current Barriers:  Chronic Disease Management support and education needs related to effective management of DM Financial Constraints.  Lab Results  Component Value Date   HGBA1C 6.4 (H) 06/25/2022     Planned Interventions: Provided education to patient about basic DM disease process. Review and education provided. The patient states that she is happy with the way she is managing her DM. She is wanting to lose more weight. She feels she is now under 200. Discussed losing weight slowly; Reviewed medications with patient and discussed importance of medication adherence. The patient is working with the pharm D for ongoing support and education and medication management. Has paperwork to submit for medication assistance. The patient was out of her Jardiance for about 3 weeks but she has it now. When she called and inquired about it they told her they did not have her address to send it to. She was able to provide the information and get her medications soon after that. Review of making sure she calls them when she is close to needing a refill. Education and support given.  Reviewed prescribed diet with patient heart healthy/ADA diet. Review and education provided. The patient states that she is watching her portion size; Counseled on importance of regular laboratory monitoring as prescribed. Has lab work on a regular basis. Review of recent labwork and recommendations by the pcp for Vitamin D3. She takes calcitrol 25 mcg. Encouraged her to pick up an additional 1000 units Vitamin D3.   The patient verbalized understanding.      Discussed plans with patient for ongoing care management follow up and provided patient with direct contact information for care management team;      Provided patient with written educational materials related to hypo and hyperglycemia and importance of correct treatment;       Reviewed  scheduled/upcoming provider appointments including: 09-29-2022 at 10 am;         Advised patient, providing education and rationale, to check cbg once daily and when you have symptoms of low or high blood sugar and record. The patient states that her blood sugars have been around 118 to 130. She is pleased with the numbers she is getting.  The patient denies any hypoglycemic events       Review of patient status, including review of consultants reports, relevant laboratory and other test results, and medications completed;       Advised patient to discuss changes in DM or questions or concerns with provider;      Screening for signs and symptoms of depression related to chronic disease state;        Assessed social determinant of health barriers;         Symptom Management: Take medications as prescribed   Attend all scheduled provider appointments Call provider office for new concerns or questions  call the Suicide and Crisis Lifeline: 988 call the [place] National Suicide Prevention Lifeline: 872-818-9010 or TTY: 435-835-2052 TTY 434-369-1123) to talk to a trained counselor call 1-800-273-TALK (toll free, 24 hour hotline) if experiencing a Mental Health or  Behavioral Health Crisis  check feet daily for cuts, sores or redness trim toenails straight across manage portion size wash and dry feet carefully every day wear comfortable, cotton socks wear comfortable, well-fitting shoes  Follow Up Plan: Telephone follow up appointment with care management team member scheduled for: 11-03-2022 at 1 pm       CCM Expected Outcome:  Monitor, Self-Manage and Reduce Symptoms of: CKD       Current Barriers:  Knowledge Deficits related to importance of kidney protection and monitoring for dehydration or changes in conditions that may impact kidney function Chronic Disease Management support and education needs related to effective management of CKD EGFR at 30  Planned Interventions: Assessed the patient   understanding of chronic kidney disease. The patient has a good understanding of her CKD. The patient saw the nephrologist on 08-10-2022. The patient states that her CKD is stable. She states that they are monitoring her closely and she is following the plan of care. Knows when to call the provider for changes.   Evaluation of current treatment plan related to chronic kidney disease self management and patient's adherence to plan as established by provider      Provided education to patient re: stroke prevention, s/s of heart attack and stroke    Reviewed prescribed diet heart healthy/ADA diet. Is compliant with dietary restrictions. Is monitoring for changes in her diet. Reviewed medications with patient and discussed importance of compliance. The patient is compliant with medications. Denies any issues with medications at this time.     Advised patient, providing education and rationale, to monitor blood pressure daily and record, calling PCP for findings outside established parameters. Blood pressures are more stable and doing good. The patient states she is feeling better.     Discussed complications of poorly controlled blood pressure such as heart disease, stroke, circulatory complications, vision complications, kidney impairment, sexual dysfunction    Reviewed scheduled/upcoming provider appointments including: 09-29-2022 at 10 am. Saw the nephrologist on 08-10-2022  Advised patient to discuss changes in her kidney function, questions or concerns with provider    Discussed plans with patient for ongoing care management follow up and provided patient with direct contact information for care management team    Screening for signs and symptoms of depression related to chronic disease state      Discussed the impact of chronic kidney disease on daily life and mental health and acknowledged and normalized feelings of disempowerment, fear, and frustration    Assessed social determinant of health barriers     Provided education on kidney disease progression    Engage patient in early, proactive and ongoing discussion about goals of care and what matters most to them    Support coping and stress management by recognizing current strategies and assist in developing new strategies such as mindfulness, journaling, relaxation techniques, problem-solving     Symptom Management: Take medications as prescribed   Attend all scheduled provider appointments Call provider office for new concerns or questions  call the Suicide and Crisis Lifeline: 988 call the [place] National Suicide Prevention Lifeline: 985-270-1223 or TTY: (445) 567-9319 TTY 929 549 8252) to talk to a trained counselor call 1-800-273-TALK (toll free, 24 hour hotline) if experiencing a Mental Health or Behavioral Health Crisis   Follow Up Plan: Telephone follow up appointment with care management team member scheduled for: 11-03-2022 at 1 pm       CCM Expected Outcome:  Monitor, Self-Manage, and Reduce Symptoms of Hypertension  Current Barriers:  Chronic Disease Management support and education needs related to effective management of HTN BP Readings from Last 3 Encounters:  06/25/22 138/69  04/05/22 (!) 155/64  03/23/22 117/70     Planned Interventions: Evaluation of current treatment plan related to hypertension self management and patient's adherence to plan as established by provider. The patient states that her blood pressures are stable and she is following the plan of care. Denies any elevations in her blood pressures. States that her HTN and heart health are stable. ;   Provided education to patient re: stroke prevention, s/s of heart attack and stroke; Reviewed prescribed diet heart healthy/ADA diet. Education provided on heart healthy/ADA diet. The patient wants to lose weight. She is watching her dietary intake and has a goal of getting to 150 pounds.  Currently her weight is 202 the last time she went to the doctor. She  states that she thinks she is actually under 200 now. . Reviewed medications with patient and discussed importance of compliance. The patient is compliant with medications. Working with the pharm D for management of medications and PAP assistance. Continues to work with pharm D. Denies any issues with medications at this time. ;  Discussed plans with patient for ongoing care management follow up and provided patient with direct contact information for care management team; Advised patient, providing education and rationale, to monitor blood pressure daily and record, calling PCP for findings outside established parameters;  Reviewed scheduled/upcoming provider appointments including: 09-29-2022 at 10 am,  Advised patient to discuss changes in blood pressure or heart health with provider; Provided education on prescribed diet heart healthy/ADA diet ;  Discussed complications of poorly controlled blood pressure such as heart disease, stroke, circulatory complications, vision complications, kidney impairment, sexual dysfunction;  Screening for signs and symptoms of depression related to chronic disease state;  Assessed social determinant of health barriers;   Symptom Management: Take medications as prescribed   Attend all scheduled provider appointments Call provider office for new concerns or questions  call the Suicide and Crisis Lifeline: 988 call the [place] National Suicide Prevention Lifeline: 212-620-3132 or TTY: 716-610-5711 TTY 908 790 4157) to talk to a trained counselor call 1-800-273-TALK (toll free, 24 hour hotline) if experiencing a Mental Health or Behavioral Health Crisis  check blood pressure weekly call doctor for signs and symptoms of high blood pressure develop an action plan for high blood pressure keep all doctor appointments take medications for blood pressure exactly as prescribed report new symptoms to your doctor  Follow Up Plan: Telephone follow up appointment with care  management team member scheduled for: 11-03-2022 at 1 pm          The patient verbalized understanding of instructions, educational materials, and care plan provided today and DECLINED offer to receive copy of patient instructions, educational materials, and care plan.   Telephone follow up appointment with care management team member scheduled for: 11-03-2022 at 1 pm

## 2022-09-07 NOTE — Chronic Care Management (AMB) (Signed)
Chronic Care Management   CCM RN Visit Note  09/07/2022 Name: Cindy [name] MRN: [id number] DOB: February 09, 1951  Subjective: Cindy [name] is a 72 y.o. year old female who is a primary care patient of Cannady, Dorie Rank, NP. The patient was referred to the Chronic Care Management team for assistance with care management needs subsequent to provider initiation of CCM services and plan of care.    Today's Visit:  Engaged with patient by telephone for follow up visit.        Goals Addressed             This Visit's Progress    CCM Expected Outcome:  Monitor, Self-Manage and Reduce Symptoms of Afib       Current Barriers:  Chronic Disease Management support and education needs related to effective management of AFIB  Planned Interventions: Provider order and care plan reviewed. Collaborated with PharmD regarding patient care and plan. The patient is working with the pharm D for assistance for getting Eliquis. The patient states she has been approved for the Eliquis and is thankful that she has this is concerned about other medications that she needs and has not heard if she is approved or not. Education provided and will talk to the pharm D and pcp.  Counseled on increased risk of stroke due to Afib and benefits of anticoagulation for stroke prevention. Denies any safety concerns or new issues related to increased blood pressures or changes that put her at increase risk of HA or stroke           Reviewed importance of adherence to anticoagulant exactly as prescribed. The patient is complaint with medications. Is getting Eliquis and taking as prescribed.  Advised patient to discuss changes in her heart rate or AFIB with provider Counseled on bleeding risk associated with AFIB and importance of self-monitoring for signs/symptoms of bleeding Counseled on avoidance of NSAIDs due to increased bleeding risk with anticoagulants Counseled on importance of regular laboratory monitoring as prescribed.  Has lab work on a regular basis. The patient is complaint Counseled on seeking medical attention after a head injury or if there is blood in the urine/stool Afib action plan reviewed Screening for signs and symptoms of depression related to chronic disease state Assessed social determinant of health barriers The patient got approved for lidocaine patches prescribed by the pcp. The patient has them ordered. Instructed the patient to call the pharmacy and ask for the patches to be filled for her. Education and support given.   Symptom Management: Take medications as prescribed   Attend all scheduled provider appointments Call provider office for new concerns or questions  call the Suicide and Crisis Lifeline: 988 call the [place] National Suicide Prevention Lifeline: 614-829-6056 or TTY: 347-760-9775 TTY (267) 872-6355) to talk to a trained counselor call 1-800-273-TALK (toll free, 24 hour hotline) if experiencing a Mental Health or Behavioral Health Crisis  - make a plan to eat healthy - keep all lab appointments - take medicine as prescribed  Follow Up Plan: Telephone follow up appointment with care management team member scheduled for: 11-03-2022 at 1 pm       CCM Expected Outcome:  Monitor, Self-Manage and Reduce Symptoms of Diabetes       Current Barriers:  Chronic Disease Management support and education needs related to effective management of DM Financial Constraints.  Lab Results  Component Value Date   HGBA1C 6.4 (H) 06/25/2022     Planned Interventions: Provided education to patient about basic  DM disease process. Review and education provided. The patient states that she is happy with the way she is managing her DM. She is wanting to lose more weight. She feels she is now under 200. Discussed losing weight slowly; Reviewed medications with patient and discussed importance of medication adherence. The patient is working with the pharm D for ongoing support and education and  medication management. Has paperwork to submit for medication assistance. The patient was out of her Jardiance for about 3 weeks but she has it now. When she called and inquired about it they told her they did not have her address to send it to. She was able to provide the information and get her medications soon after that. Review of making sure she calls them when she is close to needing a refill. Education and support given.  Reviewed prescribed diet with patient heart healthy/ADA diet. Review and education provided. The patient states that she is watching her portion size; Counseled on importance of regular laboratory monitoring as prescribed. Has lab work on a regular basis. Review of recent labwork and recommendations by the pcp for Vitamin D3. She takes calcitrol 25 mcg. Encouraged her to pick up an additional 1000 units Vitamin D3.   The patient verbalized understanding.      Discussed plans with patient for ongoing care management follow up and provided patient with direct contact information for care management team;      Provided patient with written educational materials related to hypo and hyperglycemia and importance of correct treatment;       Reviewed scheduled/upcoming provider appointments including: 09-29-2022 at 10 am;         Advised patient, providing education and rationale, to check cbg once daily and when you have symptoms of low or high blood sugar and record. The patient states that her blood sugars have been around 118 to 130. She is pleased with the numbers she is getting.  The patient denies any hypoglycemic events       Review of patient status, including review of consultants reports, relevant laboratory and other test results, and medications completed;       Advised patient to discuss changes in DM or questions or concerns with provider;      Screening for signs and symptoms of depression related to chronic disease state;        Assessed social determinant of health  barriers;         Symptom Management: Take medications as prescribed   Attend all scheduled provider appointments Call provider office for new concerns or questions  call the Suicide and Crisis Lifeline: 988 call the [place] National Suicide Prevention Lifeline: (478)500-7729 or TTY: (231)056-3651 TTY 669-004-3119) to talk to a trained counselor call 1-800-273-TALK (toll free, 24 hour hotline) if experiencing a Mental Health or Behavioral Health Crisis  check feet daily for cuts, sores or redness trim toenails straight across manage portion size wash and dry feet carefully every day wear comfortable, cotton socks wear comfortable, well-fitting shoes  Follow Up Plan: Telephone follow up appointment with care management team member scheduled for: 11-03-2022 at 1 pm       CCM Expected Outcome:  Monitor, Self-Manage and Reduce Symptoms of: CKD       Current Barriers:  Knowledge Deficits related to importance of kidney protection and monitoring for dehydration or changes in conditions that may impact kidney function Chronic Disease Management support and education needs related to effective management of CKD EGFR at 30  Planned  Interventions: Assessed the patient  understanding of chronic kidney disease. The patient has a good understanding of her CKD. The patient saw the nephrologist on 08-10-2022. The patient states that her CKD is stable. She states that they are monitoring her closely and she is following the plan of care. Knows when to call the provider for changes.   Evaluation of current treatment plan related to chronic kidney disease self management and patient's adherence to plan as established by provider      Provided education to patient re: stroke prevention, s/s of heart attack and stroke    Reviewed prescribed diet heart healthy/ADA diet. Is compliant with dietary restrictions. Is monitoring for changes in her diet. Reviewed medications with patient and discussed importance of  compliance. The patient is compliant with medications. Denies any issues with medications at this time.     Advised patient, providing education and rationale, to monitor blood pressure daily and record, calling PCP for findings outside established parameters. Blood pressures are more stable and doing good. The patient states she is feeling better.     Discussed complications of poorly controlled blood pressure such as heart disease, stroke, circulatory complications, vision complications, kidney impairment, sexual dysfunction    Reviewed scheduled/upcoming provider appointments including: 09-29-2022 at 10 am. Saw the nephrologist on 08-10-2022  Advised patient to discuss changes in her kidney function, questions or concerns with provider    Discussed plans with patient for ongoing care management follow up and provided patient with direct contact information for care management team    Screening for signs and symptoms of depression related to chronic disease state      Discussed the impact of chronic kidney disease on daily life and mental health and acknowledged and normalized feelings of disempowerment, fear, and frustration    Assessed social determinant of health barriers    Provided education on kidney disease progression    Engage patient in early, proactive and ongoing discussion about goals of care and what matters most to them    Support coping and stress management by recognizing current strategies and assist in developing new strategies such as mindfulness, journaling, relaxation techniques, problem-solving     Symptom Management: Take medications as prescribed   Attend all scheduled provider appointments Call provider office for new concerns or questions  call the Suicide and Crisis Lifeline: 988 call the [place] National Suicide Prevention Lifeline: 256-173-7023 or TTY: 517-233-2961 TTY (346)145-4627) to talk to a trained counselor call 1-800-273-TALK (toll free, 24 hour hotline) if  experiencing a Mental Health or Behavioral Health Crisis   Follow Up Plan: Telephone follow up appointment with care management team member scheduled for: 11-03-2022 at 1 pm       CCM Expected Outcome:  Monitor, Self-Manage, and Reduce Symptoms of Hypertension       Current Barriers:  Chronic Disease Management support and education needs related to effective management of HTN BP Readings from Last 3 Encounters:  06/25/22 138/69  04/05/22 (!) 155/64  03/23/22 117/70     Planned Interventions: Evaluation of current treatment plan related to hypertension self management and patient's adherence to plan as established by provider. The patient states that her blood pressures are stable and she is following the plan of care. Denies any elevations in her blood pressures. States that her HTN and heart health are stable. ;   Provided education to patient re: stroke prevention, s/s of heart attack and stroke; Reviewed prescribed diet heart healthy/ADA diet. Education provided on heart healthy/ADA diet.  The patient wants to lose weight. She is watching her dietary intake and has a goal of getting to 150 pounds.  Currently her weight is 202 the last time she went to the doctor. She states that she thinks she is actually under 200 now. . Reviewed medications with patient and discussed importance of compliance. The patient is compliant with medications. Working with the pharm D for management of medications and PAP assistance. Continues to work with pharm D. Denies any issues with medications at this time. ;  Discussed plans with patient for ongoing care management follow up and provided patient with direct contact information for care management team; Advised patient, providing education and rationale, to monitor blood pressure daily and record, calling PCP for findings outside established parameters;  Reviewed scheduled/upcoming provider appointments including: 09-29-2022 at 10 am,  Advised patient to discuss  changes in blood pressure or heart health with provider; Provided education on prescribed diet heart healthy/ADA diet ;  Discussed complications of poorly controlled blood pressure such as heart disease, stroke, circulatory complications, vision complications, kidney impairment, sexual dysfunction;  Screening for signs and symptoms of depression related to chronic disease state;  Assessed social determinant of health barriers;   Symptom Management: Take medications as prescribed   Attend all scheduled provider appointments Call provider office for new concerns or questions  call the Suicide and Crisis Lifeline: 988 call the [place] National Suicide Prevention Lifeline: 908-321-04111-(442)115-4996 or TTY: 720-074-86091-800-799-4 TTY 787-380-2960(1-(431) [phone]) to talk to a trained counselor call 1-800-273-TALK (toll free, 24 hour hotline) if experiencing a Mental Health or Behavioral Health Crisis  check blood pressure weekly call doctor for signs and symptoms of high blood pressure develop an action plan for high blood pressure keep all doctor appointments take medications for blood pressure exactly as prescribed report new symptoms to your doctor  Follow Up Plan: Telephone follow up appointment with care management team member scheduled for: 11-03-2022 at 1 pm          Plan:Telephone follow up appointment with care management team member scheduled for:  11-03-2022 at 1 pm  Alto DenverPam Logen Fowle RN, MSN, CCM RN Care Manager  Chronic Care Management Direct Number: [phone]

## 2022-09-26 NOTE — Patient Instructions (Signed)
Diabetes Mellitus Basics  Diabetes mellitus, or diabetes, is a long-term (chronic) disease. It occurs when the body does not properly use sugar (glucose) that is released from food after you eat. Diabetes mellitus may be caused by one or both of these problems: Your pancreas does not make enough of a hormone called insulin. Your body does not react in a normal way to the insulin that it makes. Insulin lets glucose enter cells in your body. This gives you energy. If you have diabetes, glucose cannot get into cells. This causes high blood glucose (hyperglycemia). How to treat and manage diabetes You may need to take insulin or other diabetes medicines daily to keep your glucose in balance. If you are prescribed insulin, you will learn how to give yourself insulin by injection. You may need to adjust the amount of insulin you take based on the foods that you eat. You will need to check your blood glucose levels using a glucose monitor as told by your health care provider. The readings can help determine if you have low or high blood glucose. Generally, you should have these blood glucose levels: Before meals (preprandial): 80-130 mg/dL (4.4-7.2 mmol/L). After meals (postprandial): below 180 mg/dL (10 mmol/L). Hemoglobin A1c (HbA1c) level: less than 7%. Your health care provider will set treatment goals for you. Keep all follow-up visits. This is important. Follow these instructions at home: Diabetes medicines Take your diabetes medicines every day as told by your health care provider. List your diabetes medicines here: Name of medicine: ______________________________ Amount (dose): _______________ Time (a.m./p.m.): _______________ Notes: ___________________________________ Name of medicine: ______________________________ Amount (dose): _______________ Time (a.m./p.m.): _______________ Notes: ___________________________________ Name of medicine: ______________________________ Amount (dose):  _______________ Time (a.m./p.m.): _______________ Notes: ___________________________________ Insulin If you use insulin, list the types of insulin you use here: Insulin type: ______________________________ Amount (dose): _______________ Time (a.m./p.m.): _______________Notes: ___________________________________ Insulin type: ______________________________ Amount (dose): _______________ Time (a.m./p.m.): _______________ Notes: ___________________________________ Insulin type: ______________________________ Amount (dose): _______________ Time (a.m./p.m.): _______________ Notes: ___________________________________ Insulin type: ______________________________ Amount (dose): _______________ Time (a.m./p.m.): _______________ Notes: ___________________________________ Insulin type: ______________________________ Amount (dose): _______________ Time (a.m./p.m.): _______________ Notes: ___________________________________ Managing blood glucose  Check your blood glucose levels using a glucose monitor as told by your health care provider. Write down the times that you check your glucose levels here: Time: _______________ Notes: ___________________________________ Time: _______________ Notes: ___________________________________ Time: _______________ Notes: ___________________________________ Time: _______________ Notes: ___________________________________ Time: _______________ Notes: ___________________________________ Time: _______________ Notes: ___________________________________  Low blood glucose Low blood glucose (hypoglycemia) is when glucose is at or below 70 mg/dL (3.9 mmol/L). Symptoms may include: Feeling: Hungry. Sweaty and clammy. Irritable or easily upset. Dizzy. Sleepy. Having: A fast heartbeat. A headache. A change in your vision. Numbness around the mouth, lips, or tongue. Having trouble with: Moving (coordination). Sleeping. Treating low blood glucose To treat low blood  glucose, eat or drink something containing sugar right away. If you can think clearly and swallow safely, follow the 15:15 rule: Take 15 grams of a fast-acting carb (carbohydrate), as told by your health care provider. Some fast-acting carbs are: Glucose tablets: take 3-4 tablets. Hard candy: eat 3-5 pieces. Fruit juice: drink 4 oz (120 mL). Regular (not diet) soda: drink 4-6 oz (120-180 mL). Honey or sugar: eat 1 Tbsp (15 mL). Check your blood glucose levels 15 minutes after you take the carb. If your glucose is still at or below 70 mg/dL (3.9 mmol/L), take 15 grams of a carb again. If your glucose does not go above 70 mg/dL (3.9 mmol/L) after   3 tries, get help right away. After your glucose goes back to normal, eat a meal or a snack within 1 hour. Treating very low blood glucose If your glucose is at or below 54 mg/dL (3 mmol/L), you have very low blood glucose (severe hypoglycemia). This is an emergency. Do not wait to see if the symptoms will go away. Get medical help right away. Call your local emergency services (911 in the [place] Do not drive yourself to the hospital. Questions to ask your health care provider Should I talk with a diabetes educator? What equipment will I need to care for myself at home? What diabetes medicines do I need? When should I take them? How often do I need to check my blood glucose levels? What number can I call if I have questions? When is my follow-up visit? Where can I find a support group for people with diabetes? Where to find more information American Diabetes Association: www.diabetes.org Association of Diabetes Care and Education Specialists: www.diabeteseducator.org Contact a health care provider if: Your blood glucose is at or above 240 mg/dL (13.3 mmol/L) for 2 days in a row. You have been sick or have had a fever for 2 days or more, and you are not getting better. You have any of these problems for more than 6 hours: You cannot eat or  drink. You feel nauseous. You vomit. You have diarrhea. Get help right away if: Your blood glucose is lower than 54 mg/dL (3 mmol/L). You get confused. You have trouble thinking clearly. You have trouble breathing. These symptoms may represent a serious problem that is an emergency. Do not wait to see if the symptoms will go away. Get medical help right away. Call your local emergency services (911 in the [place] Do not drive yourself to the hospital. Summary Diabetes mellitus is a chronic disease that occurs when the body does not properly use sugar (glucose) that is released from food after you eat. Take insulin and diabetes medicines as told. Check your blood glucose every day, as often as told. Keep all follow-up visits. This is important. This information is not intended to replace advice given to you by your health care provider. Make sure you discuss any questions you have with your health care provider. Document Revised: 09/18/2019 Document Reviewed: 09/18/2019 Elsevier Patient Education  2023 [employer]  

## 2022-09-28 DIAGNOSIS — N184 Chronic kidney disease, stage 4 (severe): Secondary | ICD-10-CM

## 2022-09-28 DIAGNOSIS — I4891 Unspecified atrial fibrillation: Secondary | ICD-10-CM

## 2022-09-28 DIAGNOSIS — E1169 Type 2 diabetes mellitus with other specified complication: Secondary | ICD-10-CM | POA: Diagnosis not present

## 2022-09-28 DIAGNOSIS — I129 Hypertensive chronic kidney disease with stage 1 through stage 4 chronic kidney disease, or unspecified chronic kidney disease: Secondary | ICD-10-CM

## 2022-09-29 ENCOUNTER — Ambulatory Visit (INDEPENDENT_AMBULATORY_CARE_PROVIDER_SITE_OTHER): Payer: Medicare HMO | Admitting: Nurse Practitioner

## 2022-09-29 ENCOUNTER — Encounter: Payer: Self-pay | Admitting: Nurse Practitioner

## 2022-09-29 DIAGNOSIS — I340 Nonrheumatic mitral (valve) insufficiency: Secondary | ICD-10-CM

## 2022-09-29 DIAGNOSIS — I700 Atherosclerosis of aorta: Secondary | ICD-10-CM

## 2022-09-29 DIAGNOSIS — D6869 Other thrombophilia: Secondary | ICD-10-CM

## 2022-09-29 DIAGNOSIS — Z23 Encounter for immunization: Secondary | ICD-10-CM | POA: Diagnosis not present

## 2022-09-29 DIAGNOSIS — E1122 Type 2 diabetes mellitus with diabetic chronic kidney disease: Secondary | ICD-10-CM

## 2022-09-29 DIAGNOSIS — I480 Paroxysmal atrial fibrillation: Secondary | ICD-10-CM | POA: Diagnosis not present

## 2022-09-29 DIAGNOSIS — E210 Primary hyperparathyroidism: Secondary | ICD-10-CM

## 2022-09-29 DIAGNOSIS — E1169 Type 2 diabetes mellitus with other specified complication: Secondary | ICD-10-CM

## 2022-09-29 DIAGNOSIS — E785 Hyperlipidemia, unspecified: Secondary | ICD-10-CM | POA: Diagnosis not present

## 2022-09-29 DIAGNOSIS — I152 Hypertension secondary to endocrine disorders: Secondary | ICD-10-CM | POA: Diagnosis not present

## 2022-09-29 DIAGNOSIS — I517 Cardiomegaly: Secondary | ICD-10-CM

## 2022-09-29 DIAGNOSIS — N1832 Chronic kidney disease, stage 3b: Secondary | ICD-10-CM | POA: Diagnosis not present

## 2022-09-29 DIAGNOSIS — N184 Chronic kidney disease, stage 4 (severe): Secondary | ICD-10-CM | POA: Diagnosis not present

## 2022-09-29 DIAGNOSIS — F321 Major depressive disorder, single episode, moderate: Secondary | ICD-10-CM

## 2022-09-29 DIAGNOSIS — E1159 Type 2 diabetes mellitus with other circulatory complications: Secondary | ICD-10-CM | POA: Diagnosis not present

## 2022-09-29 DIAGNOSIS — L0292 Furuncle, unspecified: Secondary | ICD-10-CM

## 2022-09-29 DIAGNOSIS — G4733 Obstructive sleep apnea (adult) (pediatric): Secondary | ICD-10-CM

## 2022-09-29 LAB — BAYER DCA HB A1C WAIVED: HB A1C (BAYER DCA - WAIVED): 6.3 % — ABNORMAL HIGH (ref 4.8–5.6)

## 2022-09-29 MED ORDER — MUPIROCIN 2 % EX OINT: 1.0000 | TOPICAL_OINTMENT | Freq: Two times a day (BID) | CUTANEOUS | 5 refills | Status: AC

## 2022-09-29 NOTE — Progress Notes (Signed)
BP 122/64 (BP Location: Left Arm, Patient Position: Sitting, Cuff Size: Large)   Pulse 60   Temp 98.4 F (36.9 C) (Oral)   Ht 5' 0.98" (1.549 m)   Wt 203 lb 14.4 oz (92.5 kg)   SpO2 98%   BMI 38.55 kg/m    Subjective:    Patient ID: Cindy [name], female    DOB: 1951/03/02, 72 y.o.   MRN: [id number]  HPI: Cindy [name] is a 72 y.o. female  Chief Complaint  Patient presents with   Diabetes   Hypertension   Hyperlipidemia   Atrial Fibrillation   Chronic Kidney Disease   Depression   DIABETES A1c 6.4% January.  Taking Jardiance and Ozempic.  CCM for cost assist with Jardiance, Eliquis, and Ozempic -- obtains through programs.  Has lost 13 pounds since November with Ozempic. Hypoglycemic episodes:no Polydipsia/polyuria: no Visual disturbance: no Chest pain: no Paresthesias: no Glucose Monitoring: yes             Accucheck frequency: Daily             Fasting glucose: 130 to 160 range, 131 this morning             Post prandial:             Evening:             Before meals: Taking Insulin?: no             Long acting insulin:             Short acting insulin: Blood Pressure Monitoring: a few times a week Retinal Examination: Not Up to Date -- [facility] next week scheduled Foot Exam: Up to Date  Pneumovax: Up To Date  Influenza: Not up to Date - refuses Aspirin: no    HYPERTENSION / HYPERLIPIDEMIA/A-FIB Follows with cardiology, last 05/12/22.  Follows with pulmonary, after referral from cardiology, due to shortness of breath -- last saw 08/18/22.  She says they recommended a new sleep study for her OSA.    Continues on Eliquis (gets via assistance), Atorvastatin, Diltiazem, Lasix, Olmesartan, Bisoprolol, Spironolactone, and Minoxidil.   Last echo 11/26/20 with EF 60%.  Aortic atherosclerosis noted on 07/15/20 imaging.   Satisfied with current treatment? yes Duration of hypertension: chronic BP monitoring frequency: once a week BP range: <130/80 on  average BP medication side effects: no Duration of hyperlipidemia: chronic Cholesterol medication side effects: no Cholesterol supplements: none Medication compliance: good compliance Aspirin: no Recent stressors: no Recurrent headaches: no Visual changes: no Palpitations: occasional, rare flip flop Dyspnea: ongoing with activity Chest pain: no Lower extremity edema: no Dizzy/lightheaded: occasional if changes positions too fast  CHRONIC KIDNEY DISEASE Labs on 08/10/22 with nephrology = CRT 1.52, eGFR 36, PTH 105, H/H 11.4/35.  Continues to have recurrent boil to groin area, needs refill on Mupirocin.   CKD status: controlled Medications renally dose: yes Previous renal evaluation: no Pneumovax:  Up To Date Influenza Vaccine:  Not up to Date --refuses  DEPRESSION Continues on Duloxetine and Buspar daily.   Mood status: stable Satisfied with current treatment?: yes Symptom severity: moderate  Duration of current treatment : chronic Side effects: no Medication compliance: good compliance Psychotherapy/counseling: none Depressed mood: occasional Anxious mood: occasional Anhedonia: no Significant weight loss or gain: no Insomnia: none Fatigue: no Feelings of worthlessness or guilt: no Impaired concentration/indecisiveness: no Suicidal ideations: no Hopelessness: no Crying spells: no    09/29/2022   10:00 AM 06/25/2022  10:05 AM 03/23/2022   10:06 AM 01/20/2022   11:43 AM 12/21/2021   10:21 AM  Depression screen PHQ 2/9  Decreased Interest 1 1 1  0 1  Down, Depressed, Hopeless 0 1 1 1 2   PHQ - 2 Score 1 2 2 1 3   Altered sleeping 1 1 1 2 2   Tired, decreased energy 1 1 1 2 1   Change in appetite 0 1 0 0 0  Feeling bad or failure about yourself  0 0 0 0 0  Trouble concentrating 0 0 1 0 1  Moving slowly or fidgety/restless 0 0 1 0 0  Suicidal thoughts 0 0 0 0 0  PHQ-9 Score 3 5 6 5 7   Difficult doing work/chores Not difficult at all Not difficult at all Not difficult  at all Not difficult at all Not difficult at all       09/29/2022   10:01 AM 06/25/2022   10:06 AM 03/23/2022   10:07 AM 12/21/2021   10:21 AM  GAD 7 : Generalized Anxiety Score  Nervous, Anxious, on Edge 1 1 1 1   Control/stop worrying 0 0 1 1  Worry too much - different things 0 1 1 1   Trouble relaxing 0 1 1 0  Restless 0 0 0 0  Easily annoyed or irritable 0 1 0 1  Afraid - awful might happen 0 0 0 0  Total GAD 7 Score 1 4 4 4   Anxiety Difficulty Not difficult at all Not difficult at all Not difficult at all Not difficult at all   Relevant past medical, surgical, family and social history reviewed and updated as indicated. Interim medical history since our last visit reviewed. Allergies and medications reviewed and updated.  Review of Systems  Constitutional:  Negative for activity change, appetite change, diaphoresis, fatigue and fever.  Respiratory:  Positive for shortness of breath. Negative for cough, chest tightness and wheezing.   Cardiovascular:  Negative for chest pain, palpitations and leg swelling.  Gastrointestinal: Negative.   Endocrine: Negative for cold intolerance, heat intolerance, polydipsia, polyphagia and polyuria.  Neurological: Negative.   Psychiatric/Behavioral: Negative.     Per HPI unless specifically indicated above     Objective:    BP 122/64 (BP Location: Left Arm, Patient Position: Sitting, Cuff Size: Large)   Pulse 60   Temp 98.4 F (36.9 C) (Oral)   Ht 5' 0.98" (1.549 m)   Wt 203 lb 14.4 oz (92.5 kg)   SpO2 98%   BMI 38.55 kg/m   Wt Readings from Last 3 Encounters:  09/29/22 203 lb 14.4 oz (92.5 kg)  06/25/22 204 lb 6.4 oz (92.7 kg)  04/05/22 216 lb (98 kg)    Physical Exam Vitals and nursing note reviewed. Exam conducted with a chaperone present.  Constitutional:      General: She is awake. She is not in acute distress.    Appearance: She is well-developed and well-groomed. She is morbidly obese. She is not ill-appearing or  toxic-appearing.  HENT:     Head: Normocephalic.     Right Ear: Hearing normal.     Left Ear: Hearing normal.  Eyes:     General: Lids are normal.        Right eye: No discharge.        Left eye: No discharge.     Conjunctiva/sclera: Conjunctivae normal.     Pupils: Pupils are equal, round, and reactive to light.  Neck:     Thyroid: No thyromegaly.  Vascular: No carotid bruit.  Cardiovascular:     Rate and Rhythm: Normal rate and regular rhythm.     Heart sounds: Murmur heard.     Systolic murmur is present with a grade of 2/6.     No gallop.  Pulmonary:     Effort: Pulmonary effort is normal. No accessory muscle usage or respiratory distress.     Breath sounds: No decreased breath sounds, wheezing or rhonchi.  Abdominal:     General: Bowel sounds are normal. There is no distension.     Palpations: Abdomen is soft.  Genitourinary:    Exam position: Lithotomy position.     Labia:        Right: No rash.        Left: No rash.   Musculoskeletal:     Cervical back: Normal range of motion and neck supple.     Right lower leg: No edema.     Left lower leg: No edema.  Lymphadenopathy:     Cervical: No cervical adenopathy.  Skin:    General: Skin is warm and dry.  Neurological:     Mental Status: She is alert and oriented to person, place, and time.     Deep Tendon Reflexes: Reflexes are normal and symmetric.     Reflex Scores:      Brachioradialis reflexes are 2+ on the right side and 2+ on the left side.      Patellar reflexes are 2+ on the right side and 2+ on the left side. Psychiatric:        Attention and Perception: Attention normal.        Mood and Affect: Mood normal.        Speech: Speech normal.        Behavior: Behavior normal. Behavior is cooperative.        Thought Content: Thought content normal.    Results for orders placed or performed in visit on 06/25/22  Bayer DCA Hb A1c Waived  Result Value Ref Range   HB A1C (BAYER DCA - WAIVED) 6.4 (H) 4.8 - 5.6  %  Microalbumin, Urine Waived  Result Value Ref Range   Microalb, Ur Waived 80 (H) 0 - 19 mg/L   Creatinine, Urine Waived 50 10 - 300 mg/dL   Microalb/Creat Ratio >300 (H) <30 mg/g  VITAMIN D 25 Hydroxy (Vit-D Deficiency, Fractures)  Result Value Ref Range   Vit D, 25-Hydroxy 19.4 (L) 30.0 - 100.0 ng/mL  Lipid Panel w/o Chol/HDL Ratio  Result Value Ref Range   Cholesterol, Total 156 100 - 199 mg/dL   Triglycerides 161 0 - 149 mg/dL   HDL 41 >09 mg/dL   VLDL Cholesterol Cal 20 5 - 40 mg/dL   LDL Chol Calc (NIH) 95 0 - 99 mg/dL      Assessment & Plan:   Problem List Items Addressed This Visit       Cardiovascular and Mediastinum   Aortic atherosclerosis (HCC)    Ongoing.  Noted on 07/15/20 imaging.  At this time continue statin and Eliquis daily for prevention.        Hypertension associated with diabetes (HCC)    Chronic, stable.  BP at goal in office on recheck and at home on checks.  At this time will continue current regimen and adjust as needed based on BP and HR.  Continue collaboration with cardiology.  Recommend she monitor BP at least a few mornings a week at home and document.  DASH diet at  home.  Labs: up to date with nephrology.  Return in 3 months.       Relevant Orders   Bayer DCA Hb A1c Waived   Left ventricular hypertrophy    Noted on echo June 2022.  Continue collaboration with cardiology and current medication regimen.      Mitral regurgitation    Mild mitral and tricuspid regurgitation noted on echo June 2022.  Continue collaboration with cardiology and current medication regimen.      Paroxysmal atrial fibrillation (HCC)    Chronic, ongoing, followed by cardiology.  Continue Eliquis and Metoprolol + collaboration with cardiology team.  Rate controlled.  Continue to collaborate with CCM team.  Recent notes reviewed.        Respiratory   OSA (obstructive sleep apnea)    Chronic, ongoing.  Continue 100% adherence to CPAP use at night.  Will place  referral for new sleep study as recommended due patient report CPAP not working appropriately.      Relevant Orders   Ambulatory referral to Sleep Studies     Endocrine   CKD stage 4 due to type 2 diabetes mellitus (HCC)    Chronic, ongoing.  At this time reduce and renal dose medications as needed + continue collaboration with nephrology.  Labs: up to date with nephrology.  Avoid NSAIDs.      Relevant Orders   Bayer DCA Hb A1c Waived   Hyperlipidemia associated with type 2 diabetes mellitus (HCC)    Chronic, ongoing.  Continue current medication regimen and adjust as needed.  Lipid panel up to date.       Relevant Orders   Bayer DCA Hb A1c Waived   Primary hyperparathyroidism (HCC)    Chronic, ongoing.  Continue collaboration with nephrology and current medication regimen. Recent labs performed by them and stable.      Type 2 diabetes mellitus with morbid obesity (HCC) - Primary    Chronic, ongoing with A1c 6.3% today, downward trend and urine ALB 80 January 2024.  Praised for ongoing success. - Continue Jardiance 25 MG and Ozempic 0.5 MG weekly at this time, as seeing significant benefit with this.  Gets via CCM assistance. - Maintain off Glimepiride due to her age and risk for hypoglycemia. Plus maintain off Metformin due to kidney health. - If poor control once at MAX doses of GLP1 and SGLT2, then will consider referral to endo, but at this time will continue to work on this in office which patient agrees with. - Check BS TID and focus on diabetic diet.    - Return in 3 months for A1c -- but alert provider prior to if elevations in BS >300 or <70.        Relevant Orders   Bayer DCA Hb A1c Waived     Musculoskeletal and Integument   Recurrent boils    Currently with one to groin area, refills on Mupirocin sent.  No drainage at this time.  Recommend warm compresses and Tylenol as needed for pain.  Return to office for worsening or ongoing, may need dermatology or general  surgery referral.      Relevant Medications   mupirocin ointment (BACTROBAN) 2 %     Genitourinary   Anemia in chronic kidney disease (CKD)    Ongoing.  Labs up to date with nephrology and stable.  Continue this collaboration, recent notes and labs reviewed.        Hematopoietic and Hemostatic   Other thrombophilia (HCC)    Related  to a-fib and long term Eliquis.  Monitor CBC regularly and monitor skin for bruising or breakdown, notify provider if present.        Other   Depression, major, single episode, moderate (HCC)    Chronic, stable.   Denies SI/HI.  Continue Duloxetine at this time, which also offers benefit to chronic pain and mood, + continue Buspar which has offered her benefit to anxiety.  Refills as needed.  Will adjust dosing or add medication as needed.  Return in 3 months.      Morbid obesity (HCC)    BMI 38.55 with T2DM, HTN, CKD with some more loss noted, praised for this.  Recommended eating smaller high protein, low fat meals more frequently and exercising 30 mins a day 5 times a week with a goal of 10-15lb weight loss in the next 3 months. Patient voiced their understanding and motivation to adhere to these recommendations.       Other Visit Diagnoses     Pneumococcal vaccination given       PCV20 provided in office today and educated on this.   Relevant Orders   Pneumococcal conjugate vaccine 20-valent (Prevnar 20)        Follow up plan: Return in about 3 months (around 12/30/2022) for T2DM, HTN/HLD, CKD, MOOD, A-FIB.

## 2022-09-29 NOTE — Assessment & Plan Note (Signed)
Chronic, ongoing with A1c 6.3% today, downward trend and urine ALB 80 January 2024.  Praised for ongoing success. - Continue Jardiance 25 MG and Ozempic 0.5 MG weekly at this time, as seeing significant benefit with this.  Gets via CCM assistance. - Maintain off Glimepiride due to her age and risk for hypoglycemia. Plus maintain off Metformin due to kidney health. - If poor control once at MAX doses of GLP1 and SGLT2, then will consider referral to endo, but at this time will continue to work on this in office which patient agrees with. - Check BS TID and focus on diabetic diet.    - Return in 3 months for A1c -- but alert provider prior to if elevations in BS >300 or <70.

## 2022-09-29 NOTE — Assessment & Plan Note (Signed)
Ongoing.  Labs up to date with nephrology and stable.  Continue this collaboration, recent notes and labs reviewed. 

## 2022-09-29 NOTE — Assessment & Plan Note (Signed)
BMI 38.55 with T2DM, HTN, CKD with some more loss noted, praised for this.  Recommended eating smaller high protein, low fat meals more frequently and exercising 30 mins a day 5 times a week with a goal of 10-15lb weight loss in the next 3 months. Patient voiced their understanding and motivation to adhere to these recommendations.

## 2022-09-29 NOTE — Assessment & Plan Note (Signed)
Mild mitral and tricuspid regurgitation noted on echo June 2022.  Continue collaboration with cardiology and current medication regimen. 

## 2022-09-29 NOTE — Assessment & Plan Note (Signed)
Ongoing.  Noted on 07/15/20 imaging.  At this time continue statin and Eliquis daily for prevention.   

## 2022-09-29 NOTE — Assessment & Plan Note (Signed)
Chronic, ongoing.  At this time reduce and renal dose medications as needed + continue collaboration with nephrology.  Labs: up to date with nephrology.  Avoid NSAIDs. 

## 2022-09-29 NOTE — Assessment & Plan Note (Signed)
Noted on echo June 2022.  Continue collaboration with cardiology and current medication regimen. °

## 2022-09-29 NOTE — Assessment & Plan Note (Signed)
Related to a-fib and long term Eliquis.  Monitor CBC regularly and monitor skin for bruising or breakdown, notify provider if present. 

## 2022-09-29 NOTE — Assessment & Plan Note (Signed)
Chronic, ongoing.  Continue 100% adherence to CPAP use at night.  Will place referral for new sleep study as recommended due patient report CPAP not working appropriately. 

## 2022-09-29 NOTE — Assessment & Plan Note (Signed)
Currently with one to groin area, refills on Mupirocin sent.  No drainage at this time.  Recommend warm compresses and Tylenol as needed for pain.  Return to office for worsening or ongoing, may need dermatology or general surgery referral.

## 2022-09-29 NOTE — Assessment & Plan Note (Signed)
Chronic, stable.  BP at goal in office on recheck and at home on checks.  At this time will continue current regimen and adjust as needed based on BP and HR.  Continue collaboration with cardiology.  Recommend she monitor BP at least a few mornings a week at home and document.  DASH diet at home.  Labs: up to date with nephrology.  Return in 3 months.

## 2022-09-29 NOTE — Assessment & Plan Note (Signed)
Chronic, ongoing.  Continue collaboration with nephrology and current medication regimen. Recent labs performed by them and stable. 

## 2022-09-29 NOTE — Assessment & Plan Note (Signed)
Chronic, stable  Denies SI/HI.  Continue Duloxetine at this time, which also offers benefit to chronic pain and mood, + continue Buspar which has offered her benefit to anxiety.  Refills as needed.  Will adjust dosing or add medication as needed.  Return in 3 months. 

## 2022-09-29 NOTE — Assessment & Plan Note (Signed)
Chronic, ongoing, followed by cardiology.  Continue Eliquis and Metoprolol + collaboration with cardiology team.  Rate controlled.  Continue to collaborate with CCM team.  Recent notes reviewed. 

## 2022-09-29 NOTE — Assessment & Plan Note (Addendum)
Chronic, ongoing.  Continue current medication regimen and adjust as needed.  Lipid panel up to date. 

## 2022-10-06 DIAGNOSIS — H5203 Hypermetropia, bilateral: Secondary | ICD-10-CM | POA: Diagnosis not present

## 2022-10-18 ENCOUNTER — Ambulatory Visit (INDEPENDENT_AMBULATORY_CARE_PROVIDER_SITE_OTHER): Payer: Medicare HMO | Admitting: Internal Medicine

## 2022-10-18 VITALS — BP 149/62 | HR 70 | Resp 16 | Ht 60.0 in | Wt 203.0 lb

## 2022-10-18 DIAGNOSIS — I480 Paroxysmal atrial fibrillation: Secondary | ICD-10-CM

## 2022-10-18 DIAGNOSIS — I152 Hypertension secondary to endocrine disorders: Secondary | ICD-10-CM

## 2022-10-18 DIAGNOSIS — E1159 Type 2 diabetes mellitus with other circulatory complications: Secondary | ICD-10-CM

## 2022-10-18 DIAGNOSIS — G4733 Obstructive sleep apnea (adult) (pediatric): Secondary | ICD-10-CM | POA: Diagnosis not present

## 2022-10-18 NOTE — Patient Instructions (Signed)

## 2022-10-18 NOTE — Progress Notes (Unsigned)
Sleep Medicine   Office Visit  Patient Name: Cindy [name] DOB: November 24, 1950 MRN [id number]    Chief Complaint: establish care for OSA on cpap  Brief History:  Cindy [name] presents for an initial consult for sleep evaluation and to establish care. The patient has a 11 year history of sleep apnea and is currently on a CPAP. Sleep quality is poor. This is noted most nights. Prior to using a PAP, the patient's bed partner/ family reported the following symptoms:  Excessive daytime sleepiness, witnessed apnea and snoring at night. The patient relates the following symptoms currently: She feels like she can't breathe with her PAP, excessive daytime sleepiness, and frequent awakenings. The patient goes to sleep at 8:30 pm and wakes up at 8 am. The patient reports  a history of psychiatric problems. The Epworth Sleepiness Score is 0 out of 24 . The patient's STOP-BANG score is 5. The patient relates  Cardiovascular risk factors include: mitral regurgitation, paroxysmal atrial fibrillation, hypertension. . The patient is currently on a PAP@ 13 cmH2O. The patient reports using her/him CPAP and feels not rested after sleeping with PAP.  The patient reports benefiting from PAP use and would like for her/him to continue using PAP. Reported sleepiness is improved. The compliance download shows  99% compliance with an average use time of 9 hours  44 minutes. The AHI is 0.8.  The patient continues to require PAP therapy as a medical necessity in order to eliminate his/her sleep apnea.     ROS  General: (-) fever, (-) chills, (-) night sweat Nose and Sinuses: (-) nasal stuffiness or itchiness, (-) postnasal drip, (-) nosebleeds, (-) sinus trouble. Mouth and Throat: (-) sore throat, (-) hoarseness. Neck: (-) swollen glands, (-) enlarged thyroid, (-) neck pain. Respiratory: - cough, - shortness of breath, - wheezing. Neurologic: - numbness, - tingling. Psychiatric: + anxiety, + depression Sleep behavior: -sleep  paralysis -hypnogogic hallucinations -dream enactment      -vivid dreams -cataplexy -night terrors -sleep walking   Current Medication: Outpatient Encounter Medications as of 10/18/2022  Medication Sig   acetaminophen (TYLENOL) 650 MG CR tablet Take 650 mg by mouth every 8 (eight) hours as needed for pain.   albuterol (VENTOLIN HFA) 108 (90 Base) MCG/ACT inhaler Inhale 2 puffs into the lungs every 6 (six) hours as needed.   apixaban (ELIQUIS) 5 MG TABS tablet Take 1 tablet (5 mg total) by mouth 2 (two) times daily.   atorvastatin (LIPITOR) 40 MG tablet Take 1 tablet (40 mg total) by mouth daily.   azelastine (ASTELIN) 0.1 % nasal spray Place 1 spray into both nostrils 2 (two) times daily. Use in each nostril as directed   bisoprolol (ZEBETA) 10 MG tablet TAKE 1 TABLET(10 MG) BY MOUTH TWICE DAILY   busPIRone (BUSPAR) 5 MG tablet Take 1 tablet (5 mg total) by mouth 2 (two) times daily.   cetirizine (ZYRTEC) 10 MG tablet Take 10 mg by mouth daily.   diltiazem (CARDIZEM CD) 360 MG 24 hr capsule Take 1 capsule (360 mg total) by mouth daily.   DULoxetine (CYMBALTA) 60 MG capsule Take 1 capsule (60 mg total) by mouth daily.   empagliflozin (JARDIANCE) 25 MG TABS tablet Take 1 tablet (25 mg total) by mouth daily before breakfast.   ferrous sulfate 325 (65 FE) MG tablet Take 1 tablet (325 mg total) by mouth 2 (two) times daily with a meal.   fluticasone (FLONASE) 50 MCG/ACT nasal spray Place 2 sprays into both nostrils daily.  furosemide (LASIX) 80 MG tablet TAKE ONE TABLET BY MOUTH TWICE DAILY FOR FLUID   lidocaine (LIDODERM) 5 % Place 1 patch onto the skin daily. Remove & Discard patch within 12 hours or as directed by MD   minoxidil (LONITEN) 10 MG tablet TAKE ONE TABLET BY MOUTH ONCE DAILY FOR BLOOD PRESSURE   montelukast (SINGULAIR) 10 MG tablet Take 1 tablet (10 mg total) by mouth at bedtime.   mupirocin ointment (BACTROBAN) 2 % Apply 1 Application topically 2 (two) times daily.   olmesartan  (BENICAR) 40 MG tablet Take 1 tablet (40 mg total) by mouth daily.   omeprazole (PRILOSEC) 20 MG capsule Take 20 mg by mouth daily as needed (heartburn).    polyethylene glycol powder (GLYCOLAX/MIRALAX) 17 GM/SCOOP powder MIX 17 GRAMS(1 CAPFUL) OF POWDER IN 4 TO 8 OUNCES OF FLUID AND TAKE BY MOUTH ONCE DAILY AS NEEDED FOR CONSTIPATION   Semaglutide,0.25 or 0.5MG /DOS, (OZEMPIC, 0.25 OR 0.5 MG/DOSE,) 2 MG/1.5ML SOPN Inject 0.5 mg into the skin once a week.   spironolactone (ALDACTONE) 25 MG tablet Take 1 tablet (25 mg total) by mouth daily.   tiZANidine (ZANAFLEX) 2 MG tablet Take 1 tablet (2 mg total) by mouth every 6 (six) hours as needed for muscle spasms.   No facility-administered encounter medications on file as of 10/18/2022.    Surgical History: Past Surgical History:  Procedure Laterality Date   ABDOMINAL HYSTERECTOMY     BACK SURGERY     BREAST BIOPSY Right 03/2003   CARDIAC CATHETERIZATION N/A 10/10/2014   Procedure: Left Heart Cath;  Surgeon: Marcina Millard, MD;  Location: [facility] INVASIVE CV [facility];  Service: Cardiovascular;  Laterality: N/A;   CARDIAC CATHETERIZATION     carpal tunnell release  10/23/11   COLONOSCOPY WITH PROPOFOL N/A 02/20/2015   Procedure: COLONOSCOPY WITH PROPOFOL;  Surgeon: Wallace Cullens, MD;  Location: [facility] ENDOSCOPY;  Service: Gastroenterology;  Laterality: N/A;   COLONOSCOPY WITH PROPOFOL N/A 04/26/2017   Procedure: COLONOSCOPY WITH PROPOFOL;  Surgeon: Toledo, Boykin Nearing, MD;  Location: [facility] ENDOSCOPY;  Service: Gastroenterology;  Laterality: N/A;   ESOPHAGOGASTRODUODENOSCOPY (EGD) WITH PROPOFOL N/A 02/20/2015   Procedure: ESOPHAGOGASTRODUODENOSCOPY (EGD) WITH PROPOFOL;  Surgeon: Wallace Cullens, MD;  Location: [facility] ENDOSCOPY;  Service: Gastroenterology;  Laterality: N/A;   ESOPHAGOGASTRODUODENOSCOPY (EGD) WITH PROPOFOL N/A 04/26/2017   Procedure: ESOPHAGOGASTRODUODENOSCOPY (EGD) WITH PROPOFOL;  Surgeon: Toledo, Boykin Nearing, MD;  Location: [facility] ENDOSCOPY;  Service:  Gastroenterology;  Laterality: N/A;   HERNIA REPAIR     LAPAROSCOPIC OVARIAN CYSTECTOMY  08/2001   nissen fundoplycation  11/99   NOSE SURGERY      Medical History: Past Medical History:  Diagnosis Date   Chronic pain syndrome    Depression    Diabetes mellitus without complication (HCC)    GERD (gastroesophageal reflux disease)    History of hiatal hernia    Hypertension    Iron deficiency anemia    Microalbuminuric diabetic nephropathy (HCC)    Mitral regurgitation    Obesity    Shortness of breath dyspnea    Sleep apnea     Family History: Non contributory to the present illness  Social History: Social History   Socioeconomic History   Marital status: Married    Spouse name: Not on file   Number of children: Not on file   Years of education: Not on file   Highest education level: Not on file  Occupational History   Not on file  Tobacco Use   Smoking status: Never  Smokeless tobacco: Current    Types: Snuff  Vaping Use   Vaping Use: Never used  Substance and Sexual Activity   Alcohol use: No   Drug use: No   Sexual activity: Not Currently  Other Topics Concern   Not on file  Social History Narrative   Not on file   Social Determinants of Health   Financial Resource Strain: Low Risk  (05/12/2022)   Overall Financial Resource Strain (CARDIA)    Difficulty of Paying Living Expenses: Not very hard  Food Insecurity: No Food Insecurity (05/12/2022)   Hunger Vital Sign    Worried About Running Out of Food in the Last Year: Never true    Ran Out of Food in the Last Year: Never true  Transportation Needs: No Transportation Needs (05/12/2022)   PRAPARE - Administrator, Civil Service (Medical): No    Lack of Transportation (Non-Medical): No  Physical Activity: Insufficiently Active (05/12/2022)   Exercise Vital Sign    Days of Exercise per Week: 2 days    Minutes of Exercise per Session: 10 min  Stress: No Stress Concern Present (05/12/2022)    [employer] of Occupational Health - Occupational Stress Questionnaire    Feeling of Stress : Not at all  Social Connections: Moderately Integrated (05/12/2022)   Social Connection and Isolation Panel [NHANES]    Frequency of Communication with Friends and Family: More than three times a week    Frequency of Social Gatherings with Friends and Family: Twice a week    Attends Religious Services: More than 4 times per year    Active Member of [employer] or Organizations: No    Attends Banker Meetings: Never    Marital Status: Married  Catering manager Violence: Not At Risk (05/12/2022)   Humiliation, Afraid, Rape, and Kick questionnaire    Fear of Current or Ex-Partner: No    Emotionally Abused: No    Physically Abused: No    Sexually Abused: No    Vital Signs: There were no vitals taken for this visit. There is no height or weight on file to calculate BMI.   Examination: General Appearance: The patient is well-developed, well-nourished, and in no distress. Neck Circumference: 40 cm Skin: Gross inspection of skin unremarkable. Head: normocephalic, no gross deformities. Eyes: no gross deformities noted. ENT: ears appear grossly normal Neurologic: Alert and oriented. No involuntary movements.    STOP BANG RISK ASSESSMENT S (snore) Have you been told that you snore?     NO   T (tired) Are you often tired, fatigued, or sleepy during the day?   YES  O (obstruction) Do you stop breathing, choke, or gasp during sleep? NO   P (pressure) Do you have or are you being treated for high blood pressure? YES   B (BMI) Is your body index greater than 35 kg/m? YES   A (age) Are you 45 years old or older? YES   N (neck) Do you have a neck circumference greater than 16 inches?   YES   G (gender) Are you a female? NO   TOTAL STOP/BANG "YES" ANSWERS 5                                                               A  STOP-Bang score of 2 or less is considered low risk, and a  score of 5 or more is high risk for having either moderate or severe OSA. For people who score 3 or 4, doctors may need to perform further assessment to determine how likely they are to have OSA.         EPWORTH SLEEPINESS SCALE:  Scale:  (0)= no chance of dozing; (1)= slight chance of dozing; (2)= moderate chance of dozing; (3)= high chance of dozing  Chance  Situtation    Sitting and reading: 0    Watching TV: 0    Sitting Inactive in public: 0    As a passenger in car: 0      Lying down to rest: 0    Sitting and talking: 0    Sitting quielty after lunch: 0    In a car, stopped in traffic: 0   TOTAL SCORE:   0 out of 24   CPAP COMPLIANCE DATA:  Date Range: 10/18/21 - 10/17/22  Average Daily Use: 9 hours 44 minutes  Median Use: 9 hours 59 minutes  Compliance for > 4 Hours: 99 %  AHI: 0.8 respiratory events per hour  Days Used: 362/365 days  Mask Leak: 55.3  95th Percentile Pressure: 14.4 cmh20    SLEEP STUDIES:  PSG (05/29/11) AHI 19, REM AHI 30, min SPO2 89%   LABS: Recent Results (from the past 2160 hour(s))  Bayer DCA Hb A1c Waived     Status: Abnormal   Collection Time: 09/29/22 10:00 AM  Result Value Ref Range   HB A1C (BAYER DCA - WAIVED) 6.3 (H) 4.8 - 5.6 %    Comment:          Prediabetes: 5.7 - 6.4          Diabetes: >6.4          Glycemic control for adults with diabetes: <7.0     Radiology: No results found.  No results found.  No results found.    Assessment and Plan: Patient Active Problem List   Diagnosis Date Noted   Chronic right-sided low back pain with right-sided sciatica 06/25/2022   Left ventricular hypertrophy 06/21/2021   Primary hyperparathyroidism (HCC) 03/14/2021   Recurrent boils 12/03/2020   Morbid obesity (HCC) 12/03/2020   CKD stage 4 due to type 2 diabetes mellitus (HCC) 12/01/2020   Aortic atherosclerosis (HCC) 12/01/2020   Hypertension associated with diabetes (HCC) 07/15/2020   Paroxysmal atrial  fibrillation (HCC) 07/15/2020   Chronic pain syndrome 07/15/2020   Depression, major, single episode, moderate (HCC) 07/15/2020   Type 2 diabetes mellitus with morbid obesity (HCC) 07/15/2020   GERD (gastroesophageal reflux disease) 07/15/2020   Hyperlipidemia associated with type 2 diabetes mellitus (HCC) 07/15/2020   OSA (obstructive sleep apnea) 07/15/2020   Other thrombophilia (HCC) 03/12/2020   History of lumbar spinal fusion 04/26/2018   Hypertensive retinopathy of both eyes 06/08/2017   DDD (degenerative disc disease), lumbar 10/31/2014   Cervical spondylosis with myelopathy 02/22/2014   Anemia in chronic kidney disease (CKD) 02/22/2014   Mitral regurgitation 09/08/2013  1. OSA (obstructive sleep apnea) Pt tolerates CPAP well and is well controlled. AHI is 0.8. She complains of dry mouth and the sensation of not getting enough air. She has a very high mask leak, and is set on pressure of APAP 13-20. She is not using the full extent of pressure. She does agree her mouth drops open during the night. I advised a full face mask  but she declines, she is willing to try a chin strap. F/u 28m  2. Paroxysmal atrial fibrillation (HCC) Understands importance of treating OSA in light of same.   3. Hypertension associated with diabetes (HCC) Hypertension Counseling:   The following hypertensive lifestyle modification were recommended and discussed:  1. Limiting alcohol intake to less than 1 oz/day of ethanol:(24 oz of beer or 8 oz of wine or 2 oz of 100-proof whiskey). 2. Take baby ASA 81 mg daily. 3. Importance of regular aerobic exercise and losing weight. 4. Reduce dietary saturated fat and cholesterol intake for overall cardiovascular health. 5. Maintaining adequate dietary potassium, calcium, and magnesium intake. 6. Regular monitoring of the blood pressure. 7. Reduce sodium intake to less than 100 mmol/day (less than 2.3 gm of sodium or less than 6 gm of sodium choride)    Pt tolerates  CPAP well and is well controlled. She complains of dry mouth and the sensation of not getting enough air. She has a very high mask leak, and is set on pressure of APAP 13-20. She is not using the full extent of pressure. She does agree her mouth drops open during the night. I advised a full face mask but she declines, she is willing to try a chin strap. F/u 54m    General Counseling: I have discussed the findings of the evaluation and examination with Cindy [name].  I have also discussed any further diagnostic evaluation thatmay be needed or ordered today. Cindy [name] verbalizes understanding of the findings of todays visit. We also reviewed her medications today and discussed drug interactions and side effects including but not limited excessive drowsiness and altered mental states. We also discussed that there is always a risk not just to her but also people around her. she has been encouraged to call the office with any questions or concerns that should arise related to todays visit.  No orders of the defined types were placed in this encounter.       I have personally obtained a history, evaluated the patient, evaluated pertinent data, formulated the assessment and plan and placed orders.  This patient was seen today by Emmaline Kluver, PA-C in collaboration with Dr. Freda Munro.    Yevonne Pax, MD [facility] Diplomate ABMS Pulmonary and Critical Care Medicine Sleep medicine

## 2022-11-03 ENCOUNTER — Ambulatory Visit (INDEPENDENT_AMBULATORY_CARE_PROVIDER_SITE_OTHER): Payer: Medicare HMO

## 2022-11-03 ENCOUNTER — Telehealth: Payer: Medicare HMO

## 2022-11-03 DIAGNOSIS — I480 Paroxysmal atrial fibrillation: Secondary | ICD-10-CM

## 2022-11-03 DIAGNOSIS — I152 Hypertension secondary to endocrine disorders: Secondary | ICD-10-CM

## 2022-11-03 DIAGNOSIS — E1122 Type 2 diabetes mellitus with diabetic chronic kidney disease: Secondary | ICD-10-CM

## 2022-11-03 NOTE — Chronic Care Management (AMB) (Signed)
Chronic Care Management   CCM RN Visit Note  11/03/2022 Name: Cindy [name] MRN: [id number] DOB: Oct 20, 1950  Subjective: Cindy [name] is a 72 y.o. year old female who is a primary care patient of Cannady, Dorie Rank, NP. The patient was referred to the Chronic Care Management team for assistance with care management needs subsequent to provider initiation of CCM services and plan of care.    Today's Visit:  Engaged with patient by telephone for follow up visit.        Goals Addressed             This Visit's Progress    CCM Expected Outcome:  Monitor, Self-Manage and Reduce Symptoms of Afib       Current Barriers:  Chronic Disease Management support and education needs related to effective management of AFIB  Planned Interventions: Provider order and care plan reviewed. Collaborated with PharmD regarding patient care and plan. The patient is working with the pharm D for assistance for getting Eliquis. The patient states she has been approved for the Eliquis and is thankful that she has this is concerned about other medications that she needs and has not heard if she is approved or not. Education provided and will talk to the pharm D and pcp.  Counseled on increased risk of stroke due to Afib and benefits of anticoagulation for stroke prevention. Denies any safety concerns or new issues related to increased blood pressures or changes that put her at increase risk of HA or stroke           Reviewed importance of adherence to anticoagulant exactly as prescribed. The patient is complaint with medications. Is getting Eliquis and taking as prescribed. Works with pharm D on a regular basis. Advised patient to discuss changes in her heart rate or AFIB with provider Counseled on bleeding risk associated with AFIB and importance of self-monitoring for signs/symptoms of bleeding Counseled on avoidance of NSAIDs due to increased bleeding risk with anticoagulants Counseled on importance of  regular laboratory monitoring as prescribed. Has lab work on a regular basis. The patient is complaint Counseled on seeking medical attention after a head injury or if there is blood in the urine/stool Afib action plan reviewed Screening for signs and symptoms of depression related to chronic disease state Assessed social determinant of health barriers The patient got approved for lidocaine patches prescribed by the pcp. The patient has them ordered. Instructed the patient to call the pharmacy and ask for the patches to be filled for her. Education and support given.   Symptom Management: Take medications as prescribed   Attend all scheduled provider appointments Call provider office for new concerns or questions  call the Suicide and Crisis Lifeline: 988 call the [place] National Suicide Prevention Lifeline: 432 072 7367 or TTY: 501-874-2931 TTY (409) 885-4793) to talk to a trained counselor call 1-800-273-TALK (toll free, 24 hour hotline) if experiencing a Mental Health or Behavioral Health Crisis  - make a plan to eat healthy - keep all lab appointments - take medicine as prescribed  Follow Up Plan: Telephone follow up appointment with care management team member scheduled for: 01-19-2023 at 1 pm       CCM Expected Outcome:  Monitor, Self-Manage and Reduce Symptoms of Diabetes       Current Barriers:  Chronic Disease Management support and education needs related to effective management of DM Financial Constraints.  Lab Results  Component Value Date   HGBA1C 6.3 (H) 09/29/2022     Planned  Interventions: Provided education to patient about basic DM disease process. Review and education provided. The patient states that she is happy with the way she is managing her DM. She is wanting to lose more weight. She feels she is now under 200. Discussed losing weight slowly. He DM is stable and she denies any new concerns. She is happy that her her A1C is stable and staying low. ; Reviewed  medications with patient and discussed importance of medication adherence. The patient is working with the pharm D for ongoing support and education and medication management. Has paperwork to submit for medication assistance. The patient was out of her Jardiance for about 3 weeks but she has it now. When she called and inquired about it they told her they did not have her address to send it to. She was able to provide the information and get her medications soon after that. Review of making sure she calls them when she is close to needing a refill. Education and support given.  Reviewed prescribed diet with patient heart healthy/ADA diet. Review and education provided. The patient states that she is watching her portion size; Counseled on importance of regular laboratory monitoring as prescribed. Has lab work on a regular basis. Review of recent labwork and recommendations by the pcp for Vitamin D3. She takes calcitrol 25 mcg. Encouraged her to pick up an additional 1000 units Vitamin D3.   The patient verbalized understanding.      Discussed plans with patient for ongoing care management follow up and provided patient with direct contact information for care management team;      Provided patient with written educational materials related to hypo and hyperglycemia and importance of correct treatment;       Reviewed scheduled/upcoming provider appointments including: 01-05-2023 at 940 am;         Advised patient, providing education and rationale, to check cbg once daily and when you have symptoms of low or high blood sugar and record. The patient states that her blood sugars have been around 115 to 140. She is pleased with the numbers she is getting.  The patient denies any hypoglycemic events       Review of patient status, including review of consultants reports, relevant laboratory and other test results, and medications completed. Has labs on a regular basis.;       Advised patient to discuss changes in  DM or questions or concerns with provider;      Screening for signs and symptoms of depression related to chronic disease state;        Assessed social determinant of health barriers;     Has a boil on her groin and it keeps erupting and then it will fill up again. She says she is keeping cream on it. She does not want infection in her body. Encouraged her to call the office for acute changes in the boil. The patient states that she will discuss more with the pcp at visit. Will continue to monitor. Will call for changes or new concerns     Symptom Management: Take medications as prescribed   Attend all scheduled provider appointments Call provider office for new concerns or questions  call the Suicide and Crisis Lifeline: 988 call the [place] National Suicide Prevention Lifeline: 775-788-9125 or TTY: 617-867-5165 TTY 657-532-6150) to talk to a trained counselor call 1-800-273-TALK (toll free, 24 hour hotline) if experiencing a Mental Health or Behavioral Health Crisis  check feet daily for cuts, sores or redness trim  toenails straight across manage portion size wash and dry feet carefully every day wear comfortable, cotton socks wear comfortable, well-fitting shoes  Follow Up Plan: Telephone follow up appointment with care management team member scheduled for: 01-19-2023 at 1 pm       CCM Expected Outcome:  Monitor, Self-Manage and Reduce Symptoms of: CKD       Current Barriers:  Knowledge Deficits related to importance of kidney protection and monitoring for dehydration or changes in conditions that may impact kidney function Chronic Disease Management support and education needs related to effective management of CKD EGFR at 30  Planned Interventions: Assessed the patient  understanding of chronic kidney disease. The patient has a good understanding of her CKD. The patient saw the nephrologist on 08-10-2022. The patient states that her CKD is stable. She states that they are monitoring  her closely and she is following the plan of care. Knows when to call the provider for changes.  Kidney function stable  at this time. Denies any new concerns. Evaluation of current treatment plan related to chronic kidney disease self management and patient's adherence to plan as established by provider      Provided education to patient re: stroke prevention, s/s of heart attack and stroke    Reviewed prescribed diet heart healthy/ADA diet. Is compliant with dietary restrictions. Is monitoring for changes in her diet. Reviewed medications with patient and discussed importance of compliance. The patient is compliant with medications. Denies any issues with medications at this time.     Advised patient, providing education and rationale, to monitor blood pressure daily and record, calling PCP for findings outside established parameters. Blood pressures are more stable and doing good. The patient states she is feeling better.     Discussed complications of poorly controlled blood pressure such as heart disease, stroke, circulatory complications, vision complications, kidney impairment, sexual dysfunction    Reviewed scheduled/upcoming provider appointments including: 01-05-2023 at 940 am. Saw the nephrologist on 08-10-2022  Advised patient to discuss changes in her kidney function, questions or concerns with provider    Discussed plans with patient for ongoing care management follow up and provided patient with direct contact information for care management team    Screening for signs and symptoms of depression related to chronic disease state      Discussed the impact of chronic kidney disease on daily life and mental health and acknowledged and normalized feelings of disempowerment, fear, and frustration    Assessed social determinant of health barriers    Provided education on kidney disease progression    Engage patient in early, proactive and ongoing discussion about goals of care and what matters  most to them    Support coping and stress management by recognizing current strategies and assist in developing new strategies such as mindfulness, journaling, relaxation techniques, problem-solving     Symptom Management: Take medications as prescribed   Attend all scheduled provider appointments Call provider office for new concerns or questions  call the Suicide and Crisis Lifeline: 988 call the [place] National Suicide Prevention Lifeline: (713) 363-3074 or TTY: (617)498-5070 TTY 218-235-2737) to talk to a trained counselor call 1-800-273-TALK (toll free, 24 hour hotline) if experiencing a Mental Health or Behavioral Health Crisis   Follow Up Plan: Telephone follow up appointment with care management team member scheduled for: 01-19-2023 at 1 pm       CCM Expected Outcome:  Monitor, Self-Manage, and Reduce Symptoms of Hypertension       Current Barriers:  Chronic Disease Management support and education needs related to effective management of HTN BP Readings from Last 3 Encounters:  10/18/22 (!) 149/62  09/29/22 122/64  06/25/22 138/69     Planned Interventions: Evaluation of current treatment plan related to hypertension self management and patient's adherence to plan as established by provider. The patient states that her blood pressures are stable and she is following the plan of care. Denies any elevations in her blood pressures. States that her HTN and heart health are stable. BP was a little elevated at last office visit. She says it is better now. Denies any acute findings today. ;   Provided education to patient re: stroke prevention, s/s of heart attack and stroke; Reviewed prescribed diet heart healthy/ADA diet. Education provided on heart healthy/ADA diet. The patient wants to lose weight. She is watching her dietary intake and has a goal of getting to 150 pounds.  Currently her weight is 202 the last time she went to the doctor. She states that she thinks she is actually under  200 now. . Reviewed medications with patient and discussed importance of compliance. The patient is compliant with medications. Working with the pharm D for management of medications and PAP assistance. Continues to work with pharm D. Denies any issues with medications at this time. ;  Discussed plans with patient for ongoing care management follow up and provided patient with direct contact information for care management team; Advised patient, providing education and rationale, to monitor blood pressure daily and record, calling PCP for findings outside established parameters;  Reviewed scheduled/upcoming provider appointments including: 01-05-2023 at 940 am,  Advised patient to discuss changes in blood pressure or heart health with provider; Provided education on prescribed diet heart healthy/ADA diet ;  Discussed complications of poorly controlled blood pressure such as heart disease, stroke, circulatory complications, vision complications, kidney impairment, sexual dysfunction;  Screening for signs and symptoms of depression related to chronic disease state;  Assessed social determinant of health barriers;   Symptom Management: Take medications as prescribed   Attend all scheduled provider appointments Call provider office for new concerns or questions  call the Suicide and Crisis Lifeline: 988 call the [place] National Suicide Prevention Lifeline: (616)538-0210 or TTY: 647 235 2753 TTY 718-268-5892) to talk to a trained counselor call 1-800-273-TALK (toll free, 24 hour hotline) if experiencing a Mental Health or Behavioral Health Crisis  check blood pressure weekly call doctor for signs and symptoms of high blood pressure develop an action plan for high blood pressure keep all doctor appointments take medications for blood pressure exactly as prescribed report new symptoms to your doctor  Follow Up Plan: Telephone follow up appointment with care management team member scheduled for:  01-19-2023 at 1 pm          Plan:Telephone follow up appointment with care management team member scheduled for:  01-19-2023 at 1 pm  Alto Denver RN, MSN, CCM RN Care Manager  Chronic Care Management Direct Number: [phone]

## 2022-11-03 NOTE — Patient Instructions (Signed)
Please call the care guide team at [phone] if you need to cancel or reschedule your appointment.   If you are experiencing a Mental Health or Behavioral Health Crisis or need someone to talk to, please call the Suicide and Crisis Lifeline: 988 call the [place] National Suicide Prevention Lifeline: 314-662-9169 or TTY: 442 738 7100 TTY 919 198 3528) to talk to a trained counselor call 1-800-273-TALK (toll free, 24 hour hotline)   Following is a copy of the CCM Program Consent:  CCM service includes personalized support from designated clinical staff supervised by the physician, including individualized plan of care and coordination with other care providers 24/7 contact phone numbers for assistance for urgent and routine care needs. Service will only be billed when office clinical staff spend 20 minutes or more in a month to coordinate care. Only one practitioner may furnish and bill the service in a calendar month. The patient may stop CCM services at amy time (effective at the end of the month) by phone call to the office staff. The patient will be responsible for cost sharing (co-pay) or up to 20% of the service fee (after annual deductible is met)  Following is a copy of your full provider care plan:   Goals Addressed             This Visit's Progress    CCM Expected Outcome:  Monitor, Self-Manage and Reduce Symptoms of Afib       Current Barriers:  Chronic Disease Management support and education needs related to effective management of AFIB  Planned Interventions: Provider order and care plan reviewed. Collaborated with PharmD regarding patient care and plan. The patient is working with the pharm D for assistance for getting Eliquis. The patient states she has been approved for the Eliquis and is thankful that she has this is concerned about other medications that she needs and has not heard if she is approved or not. Education provided and will talk to the pharm D and pcp.   Counseled on increased risk of stroke due to Afib and benefits of anticoagulation for stroke prevention. Denies any safety concerns or new issues related to increased blood pressures or changes that put her at increase risk of HA or stroke           Reviewed importance of adherence to anticoagulant exactly as prescribed. The patient is complaint with medications. Is getting Eliquis and taking as prescribed. Works with pharm D on a regular basis. Advised patient to discuss changes in her heart rate or AFIB with provider Counseled on bleeding risk associated with AFIB and importance of self-monitoring for signs/symptoms of bleeding Counseled on avoidance of NSAIDs due to increased bleeding risk with anticoagulants Counseled on importance of regular laboratory monitoring as prescribed. Has lab work on a regular basis. The patient is complaint Counseled on seeking medical attention after a head injury or if there is blood in the urine/stool Afib action plan reviewed Screening for signs and symptoms of depression related to chronic disease state Assessed social determinant of health barriers The patient got approved for lidocaine patches prescribed by the pcp. The patient has them ordered. Instructed the patient to call the pharmacy and ask for the patches to be filled for her. Education and support given.   Symptom Management: Take medications as prescribed   Attend all scheduled provider appointments Call provider office for new concerns or questions  call the Suicide and Crisis Lifeline: 988 call the [place] National Suicide Prevention Lifeline: 743-511-0101 or TTY: 325-363-3571 TTY 3313106377)  to talk to a trained counselor call 1-800-273-TALK (toll free, 24 hour hotline) if experiencing a Mental Health or Behavioral Health Crisis  - make a plan to eat healthy - keep all lab appointments - take medicine as prescribed  Follow Up Plan: Telephone follow up appointment with care management team  member scheduled for: 01-19-2023 at 1 pm       CCM Expected Outcome:  Monitor, Self-Manage and Reduce Symptoms of Diabetes       Current Barriers:  Chronic Disease Management support and education needs related to effective management of DM Financial Constraints.  Lab Results  Component Value Date   HGBA1C 6.3 (H) 09/29/2022     Planned Interventions: Provided education to patient about basic DM disease process. Review and education provided. The patient states that she is happy with the way she is managing her DM. She is wanting to lose more weight. She feels she is now under 200. Discussed losing weight slowly. He DM is stable and she denies any new concerns. She is happy that her her A1C is stable and staying low. ; Reviewed medications with patient and discussed importance of medication adherence. The patient is working with the pharm D for ongoing support and education and medication management. Has paperwork to submit for medication assistance. The patient was out of her Jardiance for about 3 weeks but she has it now. When she called and inquired about it they told her they did not have her address to send it to. She was able to provide the information and get her medications soon after that. Review of making sure she calls them when she is close to needing a refill. Education and support given.  Reviewed prescribed diet with patient heart healthy/ADA diet. Review and education provided. The patient states that she is watching her portion size; Counseled on importance of regular laboratory monitoring as prescribed. Has lab work on a regular basis. Review of recent labwork and recommendations by the pcp for Vitamin D3. She takes calcitrol 25 mcg. Encouraged her to pick up an additional 1000 units Vitamin D3.   The patient verbalized understanding.      Discussed plans with patient for ongoing care management follow up and provided patient with direct contact information for care management team;       Provided patient with written educational materials related to hypo and hyperglycemia and importance of correct treatment;       Reviewed scheduled/upcoming provider appointments including: 01-05-2023 at 940 am;         Advised patient, providing education and rationale, to check cbg once daily and when you have symptoms of low or high blood sugar and record. The patient states that her blood sugars have been around 115 to 140. She is pleased with the numbers she is getting.  The patient denies any hypoglycemic events       Review of patient status, including review of consultants reports, relevant laboratory and other test results, and medications completed. Has labs on a regular basis.;       Advised patient to discuss changes in DM or questions or concerns with provider;      Screening for signs and symptoms of depression related to chronic disease state;        Assessed social determinant of health barriers;     Has a boil on her groin and it keeps erupting and then it will fill up again. She says she is keeping cream on it. She does not want  infection in her body. Encouraged her to call the office for acute changes in the boil. The patient states that she will discuss more with the pcp at visit. Will continue to monitor. Will call for changes or new concerns     Symptom Management: Take medications as prescribed   Attend all scheduled provider appointments Call provider office for new concerns or questions  call the Suicide and Crisis Lifeline: 988 call the [place] National Suicide Prevention Lifeline: 830-561-5328 or TTY: 9494027001 TTY (712) 518-2539) to talk to a trained counselor call 1-800-273-TALK (toll free, 24 hour hotline) if experiencing a Mental Health or Behavioral Health Crisis  check feet daily for cuts, sores or redness trim toenails straight across manage portion size wash and dry feet carefully every day wear comfortable, cotton socks wear comfortable, well-fitting  shoes  Follow Up Plan: Telephone follow up appointment with care management team member scheduled for: 01-19-2023 at 1 pm       CCM Expected Outcome:  Monitor, Self-Manage and Reduce Symptoms of: CKD       Current Barriers:  Knowledge Deficits related to importance of kidney protection and monitoring for dehydration or changes in conditions that may impact kidney function Chronic Disease Management support and education needs related to effective management of CKD EGFR at 30  Planned Interventions: Assessed the patient  understanding of chronic kidney disease. The patient has a good understanding of her CKD. The patient saw the nephrologist on 08-10-2022. The patient states that her CKD is stable. She states that they are monitoring her closely and she is following the plan of care. Knows when to call the provider for changes.  Kidney function stable  at this time. Denies any new concerns. Evaluation of current treatment plan related to chronic kidney disease self management and patient's adherence to plan as established by provider      Provided education to patient re: stroke prevention, s/s of heart attack and stroke    Reviewed prescribed diet heart healthy/ADA diet. Is compliant with dietary restrictions. Is monitoring for changes in her diet. Reviewed medications with patient and discussed importance of compliance. The patient is compliant with medications. Denies any issues with medications at this time.     Advised patient, providing education and rationale, to monitor blood pressure daily and record, calling PCP for findings outside established parameters. Blood pressures are more stable and doing good. The patient states she is feeling better.     Discussed complications of poorly controlled blood pressure such as heart disease, stroke, circulatory complications, vision complications, kidney impairment, sexual dysfunction    Reviewed scheduled/upcoming provider appointments including:  01-05-2023 at 940 am. Saw the nephrologist on 08-10-2022  Advised patient to discuss changes in her kidney function, questions or concerns with provider    Discussed plans with patient for ongoing care management follow up and provided patient with direct contact information for care management team    Screening for signs and symptoms of depression related to chronic disease state      Discussed the impact of chronic kidney disease on daily life and mental health and acknowledged and normalized feelings of disempowerment, fear, and frustration    Assessed social determinant of health barriers    Provided education on kidney disease progression    Engage patient in early, proactive and ongoing discussion about goals of care and what matters most to them    Support coping and stress management by recognizing current strategies and assist in developing new strategies such as mindfulness,  journaling, relaxation techniques, problem-solving     Symptom Management: Take medications as prescribed   Attend all scheduled provider appointments Call provider office for new concerns or questions  call the Suicide and Crisis Lifeline: 988 call the [place] National Suicide Prevention Lifeline: (223)494-0293 or TTY: 904-551-7353 TTY 678 139 4276) to talk to a trained counselor call 1-800-273-TALK (toll free, 24 hour hotline) if experiencing a Mental Health or Behavioral Health Crisis   Follow Up Plan: Telephone follow up appointment with care management team member scheduled for: 01-19-2023 at 1 pm       CCM Expected Outcome:  Monitor, Self-Manage, and Reduce Symptoms of Hypertension       Current Barriers:  Chronic Disease Management support and education needs related to effective management of HTN BP Readings from Last 3 Encounters:  10/18/22 (!) 149/62  09/29/22 122/64  06/25/22 138/69     Planned Interventions: Evaluation of current treatment plan related to hypertension self management and patient's  adherence to plan as established by provider. The patient states that her blood pressures are stable and she is following the plan of care. Denies any elevations in her blood pressures. States that her HTN and heart health are stable. BP was a little elevated at last office visit. She says it is better now. Denies any acute findings today. ;   Provided education to patient re: stroke prevention, s/s of heart attack and stroke; Reviewed prescribed diet heart healthy/ADA diet. Education provided on heart healthy/ADA diet. The patient wants to lose weight. She is watching her dietary intake and has a goal of getting to 150 pounds.  Currently her weight is 202 the last time she went to the doctor. She states that she thinks she is actually under 200 now. . Reviewed medications with patient and discussed importance of compliance. The patient is compliant with medications. Working with the pharm D for management of medications and PAP assistance. Continues to work with pharm D. Denies any issues with medications at this time. ;  Discussed plans with patient for ongoing care management follow up and provided patient with direct contact information for care management team; Advised patient, providing education and rationale, to monitor blood pressure daily and record, calling PCP for findings outside established parameters;  Reviewed scheduled/upcoming provider appointments including: 01-05-2023 at 940 am,  Advised patient to discuss changes in blood pressure or heart health with provider; Provided education on prescribed diet heart healthy/ADA diet ;  Discussed complications of poorly controlled blood pressure such as heart disease, stroke, circulatory complications, vision complications, kidney impairment, sexual dysfunction;  Screening for signs and symptoms of depression related to chronic disease state;  Assessed social determinant of health barriers;   Symptom Management: Take medications as prescribed    Attend all scheduled provider appointments Call provider office for new concerns or questions  call the Suicide and Crisis Lifeline: 988 call the [place] National Suicide Prevention Lifeline: (848)828-3135 or TTY: 812-153-9818 TTY (947)495-3935) to talk to a trained counselor call 1-800-273-TALK (toll free, 24 hour hotline) if experiencing a Mental Health or Behavioral Health Crisis  check blood pressure weekly call doctor for signs and symptoms of high blood pressure develop an action plan for high blood pressure keep all doctor appointments take medications for blood pressure exactly as prescribed report new symptoms to your doctor  Follow Up Plan: Telephone follow up appointment with care management team member scheduled for: 01-19-2023 at 1 pm          The patient verbalized understanding of  instructions, educational materials, and care plan provided today and DECLINED offer to receive copy of patient instructions, educational materials, and care plan.  Telephone follow up appointment with care management team member scheduled for: 01-19-2023 at 1 pm

## 2022-11-09 ENCOUNTER — Telehealth: Payer: Self-pay

## 2022-11-09 NOTE — Telephone Encounter (Signed)
5 boxes of Ozempic received for the patient. Called and notified patient that medication was ready to be picked up.

## 2022-11-10 NOTE — Telephone Encounter (Signed)
Pt called to report that her daughter will come by to pick this up for her around 4 pm, her name is Cindy [name].

## 2022-11-12 NOTE — Telephone Encounter (Signed)
Mediation picked up by the patient's daughter.

## 2022-11-17 DIAGNOSIS — I480 Paroxysmal atrial fibrillation: Secondary | ICD-10-CM | POA: Diagnosis not present

## 2022-11-28 DIAGNOSIS — E1159 Type 2 diabetes mellitus with other circulatory complications: Secondary | ICD-10-CM | POA: Diagnosis not present

## 2022-11-28 DIAGNOSIS — E1122 Type 2 diabetes mellitus with diabetic chronic kidney disease: Secondary | ICD-10-CM

## 2022-11-28 DIAGNOSIS — N184 Chronic kidney disease, stage 4 (severe): Secondary | ICD-10-CM | POA: Diagnosis not present

## 2022-11-28 DIAGNOSIS — I129 Hypertensive chronic kidney disease with stage 1 through stage 4 chronic kidney disease, or unspecified chronic kidney disease: Secondary | ICD-10-CM | POA: Diagnosis not present

## 2022-11-28 DIAGNOSIS — I4891 Unspecified atrial fibrillation: Secondary | ICD-10-CM | POA: Diagnosis not present

## 2022-12-17 ENCOUNTER — Ambulatory Visit (INDEPENDENT_AMBULATORY_CARE_PROVIDER_SITE_OTHER): Payer: Medicare HMO

## 2022-12-17 DIAGNOSIS — E1159 Type 2 diabetes mellitus with other circulatory complications: Secondary | ICD-10-CM

## 2022-12-17 NOTE — Patient Instructions (Signed)
Please call the care guide team at [phone] if you need to cancel or reschedule your appointment.   If you are experiencing a Mental Health or Behavioral Health Crisis or need someone to talk to, please call the Suicide and Crisis Lifeline: 988 call the [place] National Suicide Prevention Lifeline: 814-146-9343 or TTY: 251-236-9802 TTY 435-847-7227) to talk to a trained counselor call 1-800-273-TALK (toll free, 24 hour hotline)   Following is a copy of the CCM Program Consent:  CCM service includes personalized support from designated clinical staff supervised by the physician, including individualized plan of care and coordination with other care providers 24/7 contact phone numbers for assistance for urgent and routine care needs. Service will only be billed when office clinical staff spend 20 minutes or more in a month to coordinate care. Only one practitioner may furnish and bill the service in a calendar month. The patient may stop CCM services at amy time (effective at the end of the month) by phone call to the office staff. The patient will be responsible for cost sharing (co-pay) or up to 20% of the service fee (after annual deductible is met)  Following is a copy of your full provider care plan:   Goals Addressed             This Visit's Progress    CCM Expected Outcome:  Monitor, Self-Manage and Reduce Symptoms of Diabetes       Current Barriers:  Chronic Disease Management support and education needs related to effective management of DM Financial Constraints.  Lab Results  Component Value Date   HGBA1C 6.3 (H) 09/29/2022     Planned Interventions: Provided education to patient about basic DM disease process. Review and education provided. The patient states that she is happy with the way she is managing her DM. She is wanting to lose more weight. She feels she is now under 200. Discussed losing weight slowly. He DM is stable and she denies any new concerns. She is  happy that her her A1C is stable and staying low. ; Reviewed medications with patient and discussed importance of medication adherence. The patient is working with the pharm D for ongoing support and education and medication management. Has paperwork to submit for medication assistance. The patient was out of her Jardiance for about 3 weeks but she has it now. When she called and inquired about it they told her they did not have her address to send it to. She was able to provide the information and get her medications soon after that. Review of making sure she calls them when she is close to needing a refill. Education and support given.  Reviewed prescribed diet with patient heart healthy/ADA diet. Review and education provided. The patient states that she is watching her portion size; Counseled on importance of regular laboratory monitoring as prescribed. Has lab work on a regular basis. Review of recent labwork and recommendations by the pcp for Vitamin D3. She takes calcitrol 25 mcg. Encouraged her to pick up an additional 1000 units Vitamin D3.   The patient verbalized understanding.      Discussed plans with patient for ongoing care management follow up and provided patient with direct contact information for care management team;      Provided patient with written educational materials related to hypo and hyperglycemia and importance of correct treatment;       Reviewed scheduled/upcoming provider appointments including: 01-05-2023 at 940 am;         Advised patient,  providing education and rationale, to check cbg once daily and when you have symptoms of low or high blood sugar and record. The patient states that her blood sugars have been around 115 to 140. She is pleased with the numbers she is getting.  The patient denies any hypoglycemic events       Review of patient status, including review of consultants reports, relevant laboratory and other test results, and medications completed. Has labs on a  regular basis.;       Advised patient to discuss changes in DM or questions or concerns with provider;      Screening for signs and symptoms of depression related to chronic disease state;        Assessed social determinant of health barriers;     Has a boil on her groin and it keeps erupting and then it will fill up again. She says she is keeping cream on it. She does not want infection in her body. Encouraged her to call the office for acute changes in the boil. The patient states that she will discuss more with the pcp at visit. Will continue to monitor. Will call for changes or new concerns  She is having issues with constipation and then she will have issues with diarrhea. The patient saw the GI specialist and he tells her she has too many problems for her to have a colonoscopy and be put to sleep. Education on things to try to relieve constipation with prune juice, eating fruits and vegetables, trying OTC products, etc. She takes Miralax now. She says sometimes the diarrhea comes on quickly. Education provided. Recommended she talk to the pcp at her upcoming visit about recommendations as she feels she has an obstruction in her rectum. May possibly be an internal hemorrhoid. Review of staying hydrated and calling the office for a sooner appointment if needed.    Symptom Management: Take medications as prescribed   Attend all scheduled provider appointments Call provider office for new concerns or questions  call the Suicide and Crisis Lifeline: 988 call the [place] National Suicide Prevention Lifeline: (787)477-2309 or TTY: 720-503-4804 TTY 830-099-0918) to talk to a trained counselor call 1-800-273-TALK (toll free, 24 hour hotline) if experiencing a Mental Health or Behavioral Health Crisis  check feet daily for cuts, sores or redness trim toenails straight across manage portion size wash and dry feet carefully every day wear comfortable, cotton socks wear comfortable, well-fitting  shoes  Follow Up Plan: Telephone follow up appointment with care management team member scheduled for: 01-19-2023 at 1 pm       CCM Expected Outcome:  Monitor, Self-Manage, and Reduce Symptoms of Hypertension       Current Barriers:  Chronic Disease Management support and education needs related to effective management of HTN BP Readings from Last 3 Encounters:  10/18/22 (!) 149/62  09/29/22 122/64  06/25/22 138/69     Planned Interventions: Evaluation of current treatment plan related to hypertension self management and patient's adherence to plan as established by provider. The patient states that her blood pressures are stable and she is following the plan of care. Denies any elevations in her blood pressures. States that her HTN and heart health are stable. BP was a little elevated at last office visit. She says it is better now. Denies any acute findings today. ;   Provided education to patient re: stroke prevention, s/s of heart attack and stroke; Reviewed prescribed diet heart healthy/ADA diet. Education provided on heart healthy/ADA diet. The  patient wants to lose weight. She is watching her dietary intake and has a goal of getting to 150 pounds.  Currently her weight is 202 the last time she went to the doctor. She states that she thinks she is actually under 200 now. . Reviewed medications with patient and discussed importance of compliance. The patient is compliant with medications. Working with the pharm D for management of medications and PAP assistance. Continues to work with pharm D. Denies any issues with medications at this time. ;  Discussed plans with patient for ongoing care management follow up and provided patient with direct contact information for care management team; Advised patient, providing education and rationale, to monitor blood pressure daily and record, calling PCP for findings outside established parameters;  Reviewed scheduled/upcoming provider appointments  including: 01-05-2023 at 940 am,  Advised patient to discuss changes in blood pressure or heart health with provider; Provided education on prescribed diet heart healthy/ADA diet ;  Discussed complications of poorly controlled blood pressure such as heart disease, stroke, circulatory complications, vision complications, kidney impairment, sexual dysfunction;  Screening for signs and symptoms of depression related to chronic disease state;  Assessed social determinant of health barriers;  Incoming call from the patient asking for assistance with questions about her bowels. Answered the patients questions and gave her recommendations to try for the issue she is having with constipation. Reflective listening and support.  Symptom Management: Take medications as prescribed   Attend all scheduled provider appointments Call provider office for new concerns or questions  call the Suicide and Crisis Lifeline: 988 call the [place] National Suicide Prevention Lifeline: (403)202-9274 or TTY: (705)814-7785 TTY 3085204982) to talk to a trained counselor call 1-800-273-TALK (toll free, 24 hour hotline) if experiencing a Mental Health or Behavioral Health Crisis  check blood pressure weekly call doctor for signs and symptoms of high blood pressure develop an action plan for high blood pressure keep all doctor appointments take medications for blood pressure exactly as prescribed report new symptoms to your doctor  Follow Up Plan: Telephone follow up appointment with care management team member scheduled for: 01-19-2023 at 1 pm          The patient verbalized understanding of instructions, educational materials, and care plan provided today and DECLINED offer to receive copy of patient instructions, educational materials, and care plan.  Telephone follow up appointment with care management team member scheduled for: 01-19-2023

## 2022-12-17 NOTE — Chronic Care Management (AMB) (Signed)
Chronic Care Management   CCM RN Visit Note  12/17/2022 Name: Cindy [name] MRN: [id number] DOB: January 05, 1951  Subjective: Cindy [name] is a 72 y.o. year old female who is a primary care patient of Cannady, Dorie Rank, NP. The patient was referred to the Chronic Care Management team for assistance with care management needs subsequent to provider initiation of CCM services and plan of care.    Today's Visit:  Engaged with patient by telephone for  the patient had left a message and ask for a call back due to concerns she had with her bowels .     SDOH Interventions Today    Flowsheet Row Most Recent Value  SDOH Interventions   Health Literacy Interventions Other (Comment)  [understands simple terms and breaking down information she can understand]         Goals Addressed             This Visit's Progress    CCM Expected Outcome:  Monitor, Self-Manage and Reduce Symptoms of Diabetes       Current Barriers:  Chronic Disease Management support and education needs related to effective management of DM Financial Constraints.  Lab Results  Component Value Date   HGBA1C 6.3 (H) 09/29/2022     Planned Interventions: Provided education to patient about basic DM disease process. Review and education provided. The patient states that she is happy with the way she is managing her DM. She is wanting to lose more weight. She feels she is now under 200. Discussed losing weight slowly. He DM is stable and she denies any new concerns. She is happy that her her A1C is stable and staying low. ; Reviewed medications with patient and discussed importance of medication adherence. The patient is working with the pharm D for ongoing support and education and medication management. Has paperwork to submit for medication assistance. The patient was out of her Jardiance for about 3 weeks but she has it now. When she called and inquired about it they told her they did not have her address to send it to.  She was able to provide the information and get her medications soon after that. Review of making sure she calls them when she is close to needing a refill. Education and support given.  Reviewed prescribed diet with patient heart healthy/ADA diet. Review and education provided. The patient states that she is watching her portion size; Counseled on importance of regular laboratory monitoring as prescribed. Has lab work on a regular basis. Review of recent labwork and recommendations by the pcp for Vitamin D3. She takes calcitrol 25 mcg. Encouraged her to pick up an additional 1000 units Vitamin D3.   The patient verbalized understanding.      Discussed plans with patient for ongoing care management follow up and provided patient with direct contact information for care management team;      Provided patient with written educational materials related to hypo and hyperglycemia and importance of correct treatment;       Reviewed scheduled/upcoming provider appointments including: 01-05-2023 at 940 am;         Advised patient, providing education and rationale, to check cbg once daily and when you have symptoms of low or high blood sugar and record. The patient states that her blood sugars have been around 115 to 140. She is pleased with the numbers she is getting.  The patient denies any hypoglycemic events       Review of patient status, including  review of consultants reports, relevant laboratory and other test results, and medications completed. Has labs on a regular basis.;       Advised patient to discuss changes in DM or questions or concerns with provider;      Screening for signs and symptoms of depression related to chronic disease state;        Assessed social determinant of health barriers;     Has a boil on her groin and it keeps erupting and then it will fill up again. She says she is keeping cream on it. She does not want infection in her body. Encouraged her to call the office for acute changes  in the boil. The patient states that she will discuss more with the pcp at visit. Will continue to monitor. Will call for changes or new concerns  She is having issues with constipation and then she will have issues with diarrhea. The patient saw the GI specialist and he tells her she has too many problems for her to have a colonoscopy and be put to sleep. Education on things to try to relieve constipation with prune juice, eating fruits and vegetables, trying OTC products, etc. She takes Miralax now. She says sometimes the diarrhea comes on quickly. Education provided. Recommended she talk to the pcp at her upcoming visit about recommendations as she feels she has an obstruction in her rectum. May possibly be an internal hemorrhoid. Review of staying hydrated and calling the office for a sooner appointment if needed.    Symptom Management: Take medications as prescribed   Attend all scheduled provider appointments Call provider office for new concerns or questions  call the Suicide and Crisis Lifeline: 988 call the [place] National Suicide Prevention Lifeline: 657-106-8400 or TTY: 636 763 7602 TTY 301-468-8070) to talk to a trained counselor call 1-800-273-TALK (toll free, 24 hour hotline) if experiencing a Mental Health or Behavioral Health Crisis  check feet daily for cuts, sores or redness trim toenails straight across manage portion size wash and dry feet carefully every day wear comfortable, cotton socks wear comfortable, well-fitting shoes  Follow Up Plan: Telephone follow up appointment with care management team member scheduled for: 01-19-2023 at 1 pm       CCM Expected Outcome:  Monitor, Self-Manage, and Reduce Symptoms of Hypertension       Current Barriers:  Chronic Disease Management support and education needs related to effective management of HTN BP Readings from Last 3 Encounters:  10/18/22 (!) 149/62  09/29/22 122/64  06/25/22 138/69     Planned  Interventions: Evaluation of current treatment plan related to hypertension self management and patient's adherence to plan as established by provider. The patient states that her blood pressures are stable and she is following the plan of care. Denies any elevations in her blood pressures. States that her HTN and heart health are stable. BP was a little elevated at last office visit. She says it is better now. Denies any acute findings today. ;   Provided education to patient re: stroke prevention, s/s of heart attack and stroke; Reviewed prescribed diet heart healthy/ADA diet. Education provided on heart healthy/ADA diet. The patient wants to lose weight. She is watching her dietary intake and has a goal of getting to 150 pounds.  Currently her weight is 202 the last time she went to the doctor. She states that she thinks she is actually under 200 now. . Reviewed medications with patient and discussed importance of compliance. The patient is compliant with medications. Working  with the pharm D for management of medications and PAP assistance. Continues to work with pharm D. Denies any issues with medications at this time. ;  Discussed plans with patient for ongoing care management follow up and provided patient with direct contact information for care management team; Advised patient, providing education and rationale, to monitor blood pressure daily and record, calling PCP for findings outside established parameters;  Reviewed scheduled/upcoming provider appointments including: 01-05-2023 at 940 am,  Advised patient to discuss changes in blood pressure or heart health with provider; Provided education on prescribed diet heart healthy/ADA diet ;  Discussed complications of poorly controlled blood pressure such as heart disease, stroke, circulatory complications, vision complications, kidney impairment, sexual dysfunction;  Screening for signs and symptoms of depression related to chronic disease state;   Assessed social determinant of health barriers;  Incoming call from the patient asking for assistance with questions about her bowels. Answered the patients questions and gave her recommendations to try for the issue she is having with constipation. Reflective listening and support.  Symptom Management: Take medications as prescribed   Attend all scheduled provider appointments Call provider office for new concerns or questions  call the Suicide and Crisis Lifeline: 988 call the [place] National Suicide Prevention Lifeline: 330 188 9213 or TTY: 684 087 0351 TTY (873)369-5562) to talk to a trained counselor call 1-800-273-TALK (toll free, 24 hour hotline) if experiencing a Mental Health or Behavioral Health Crisis  check blood pressure weekly call doctor for signs and symptoms of high blood pressure develop an action plan for high blood pressure keep all doctor appointments take medications for blood pressure exactly as prescribed report new symptoms to your doctor  Follow Up Plan: Telephone follow up appointment with care management team member scheduled for: 01-19-2023 at 1 pm          Plan:Telephone follow up appointment with care management team member scheduled for:  01-19-2023 at 1 pm  Alto Denver RN, MSN, CCM RN Care Manager  Chronic Care Management Direct Number: [phone]

## 2022-12-20 ENCOUNTER — Other Ambulatory Visit: Payer: Self-pay | Admitting: Nurse Practitioner

## 2022-12-22 DIAGNOSIS — D631 Anemia in chronic kidney disease: Secondary | ICD-10-CM | POA: Diagnosis not present

## 2022-12-22 DIAGNOSIS — I10 Essential (primary) hypertension: Secondary | ICD-10-CM | POA: Diagnosis not present

## 2022-12-22 DIAGNOSIS — N1832 Chronic kidney disease, stage 3b: Secondary | ICD-10-CM | POA: Diagnosis not present

## 2022-12-22 DIAGNOSIS — E1122 Type 2 diabetes mellitus with diabetic chronic kidney disease: Secondary | ICD-10-CM | POA: Diagnosis not present

## 2022-12-22 DIAGNOSIS — N2581 Secondary hyperparathyroidism of renal origin: Secondary | ICD-10-CM | POA: Diagnosis not present

## 2022-12-22 NOTE — Telephone Encounter (Signed)
Unable to refill per protocol, Rx request is too soon. Last refill 03/23/22 for 90 and 4 refills.  Requested Prescriptions  Pending Prescriptions Disp Refills   atorvastatin (LIPITOR) 40 MG tablet [Pharmacy Med Name: ATORVASTATIN 40MG  TABLETS] 90 tablet 4    Sig: TAKE 1 TABLET(40 MG) BY MOUTH DAILY     Cardiovascular:  Antilipid - Statins Failed - 12/20/2022  5:20 PM      Failed - Lipid Panel in normal range within the last 12 months    Cholesterol, Total  Date Value Ref Range Status  06/25/2022 156 100 - 199 mg/dL Final   LDL Chol Calc (NIH)  Date Value Ref Range Status  06/25/2022 95 0 - 99 mg/dL Final   HDL  Date Value Ref Range Status  06/25/2022 41 >39 mg/dL Final   Triglycerides  Date Value Ref Range Status  06/25/2022 110 0 - 149 mg/dL Final         Passed - Patient is not pregnant      Passed - Valid encounter within last 12 months    Recent Outpatient Visits           2 months ago Type 2 diabetes mellitus with morbid obesity (HCC)   [place] [facility] [place], [place] T, NP   6 months ago Type 2 diabetes mellitus with morbid obesity (HCC)   [place] [facility] [place], [place] T, NP   9 months ago Type 2 diabetes mellitus with morbid obesity (HCC)   [place] [facility] [place], [place] T, NP   1 year ago Type 2 diabetes mellitus with morbid obesity (HCC)   [place] [facility] [place], [place] T, NP   1 year ago Type 2 diabetes mellitus with morbid obesity (HCC)   [place] [facility] [place], Dorie Rank, NP       Future Appointments             In 2 weeks Cannady, Dorie Rank, NP [place] [facility]

## 2022-12-29 DIAGNOSIS — E1159 Type 2 diabetes mellitus with other circulatory complications: Secondary | ICD-10-CM | POA: Diagnosis not present

## 2022-12-29 DIAGNOSIS — I10 Essential (primary) hypertension: Secondary | ICD-10-CM | POA: Diagnosis not present

## 2022-12-29 DIAGNOSIS — F17290 Nicotine dependence, other tobacco product, uncomplicated: Secondary | ICD-10-CM | POA: Diagnosis not present

## 2022-12-30 NOTE — Patient Instructions (Addendum)
Try Metamucil and stop Miralax.  Also try taking Senna-S once day.  Ladean Raya, [place]  [phone] (Work)   Be Involved in Caring For Your Health:  Taking Medications When medications are taken as directed, they can greatly improve your health. But if they are not taken as prescribed, they may not work. In some cases, not taking them correctly can be harmful. To help ensure your treatment remains effective and safe, understand your medications and how to take them. Bring your medications to each visit for review by your provider.  Your lab results, notes, and after visit summary will be available on My Chart. We strongly encourage you to use this feature. If lab results are abnormal the clinic will contact you with the appropriate steps. If the clinic does not contact you assume the results are satisfactory. You can always view your results on My Chart. If you have questions regarding your health or results, please contact the clinic during office hours. You can also ask questions on My Chart.  We at [facility] are grateful that you chose [place] to provide your care. We strive to provide evidence-based and compassionate care and are always looking for feedback. If you get a survey from the clinic please complete this so we can hear your opinions.  Diabetes Mellitus Basics  Diabetes mellitus, or diabetes, is a long-term (chronic) disease. It occurs when the body does not properly use sugar (glucose) that is released from food after you eat. Diabetes mellitus may be caused by one or both of these problems: Your pancreas does not make enough of a hormone called insulin. Your body does not react in a normal way to the insulin that it makes. Insulin lets glucose enter cells in your body. This gives you energy. If you have diabetes, glucose cannot get into cells. This causes high blood glucose (hyperglycemia). How to treat and manage diabetes You may need to take insulin or other  diabetes medicines daily to keep your glucose in balance. If you are prescribed insulin, you will learn how to give yourself insulin by injection. You may need to adjust the amount of insulin you take based on the foods that you eat. You will need to check your blood glucose levels using a glucose monitor as told by your health care provider. The readings can help determine if you have low or high blood glucose. Generally, you should have these blood glucose levels: Before meals (preprandial): 80-130 mg/dL (0.9-8.1 mmol/L). After meals (postprandial): below 180 mg/dL (10 mmol/L). Hemoglobin A1c (HbA1c) level: less than 7%. Your health care provider will set treatment goals for you. Keep all follow-up visits. This is important. Follow these instructions at home: Diabetes medicines Take your diabetes medicines every day as told by your health care provider. List your diabetes medicines here: Name of medicine: ______________________________ Amount (dose): _______________ Time (a.m./p.m.): _______________ Notes: ___________________________________ Name of medicine: ______________________________ Amount (dose): _______________ Time (a.m./p.m.): _______________ Notes: ___________________________________ Name of medicine: ______________________________ Amount (dose): _______________ Time (a.m./p.m.): _______________ Notes: ___________________________________ Insulin If you use insulin, list the types of insulin you use here: Insulin type: ______________________________ Amount (dose): _______________ Time (a.m./p.m.): _______________Notes: ___________________________________ Insulin type: ______________________________ Amount (dose): _______________ Time (a.m./p.m.): _______________ Notes: ___________________________________ Insulin type: ______________________________ Amount (dose): _______________ Time (a.m./p.m.): _______________ Notes: ___________________________________ Insulin type:  ______________________________ Amount (dose): _______________ Time (a.m./p.m.): _______________ Notes: ___________________________________ Insulin type: ______________________________ Amount (dose): _______________ Time (a.m./p.m.): _______________ Notes: ___________________________________ Managing blood glucose  Check your blood glucose levels using a glucose monitor  as told by your health care provider. Write down the times that you check your glucose levels here: Time: _______________ Notes: ___________________________________ Time: _______________ Notes: ___________________________________ Time: _______________ Notes: ___________________________________ Time: _______________ Notes: ___________________________________ Time: _______________ Notes: ___________________________________ Time: _______________ Notes: ___________________________________  Low blood glucose Low blood glucose (hypoglycemia) is when glucose is at or below 70 mg/dL (3.9 mmol/L). Symptoms may include: Feeling: Hungry. Sweaty and clammy. Irritable or easily upset. Dizzy. Sleepy. Having: A fast heartbeat. A headache. A change in your vision. Numbness around the mouth, lips, or tongue. Having trouble with: Moving (coordination). Sleeping. Treating low blood glucose To treat low blood glucose, eat or drink something containing sugar right away. If you can think clearly and swallow safely, follow the 15:15 rule: Take 15 grams of a fast-acting carb (carbohydrate), as told by your health care provider. Some fast-acting carbs are: Glucose tablets: take 3-4 tablets. Hard candy: eat 3-5 pieces. Fruit juice: drink 4 oz (120 mL). Regular (not diet) soda: drink 4-6 oz (120-180 mL). Honey or sugar: eat 1 Tbsp (15 mL). Check your blood glucose levels 15 minutes after you take the carb. If your glucose is still at or below 70 mg/dL (3.9 mmol/L), take 15 grams of a carb again. If your glucose does not go above 70  mg/dL (3.9 mmol/L) after 3 tries, get help right away. After your glucose goes back to normal, eat a meal or a snack within 1 hour. Treating very low blood glucose If your glucose is at or below 54 mg/dL (3 mmol/L), you have very low blood glucose (severe hypoglycemia). This is an emergency. Do not wait to see if the symptoms will go away. Get medical help right away. Call your local emergency services (911 in the [place] Do not drive yourself to the hospital. Questions to ask your health care provider Should I talk with a diabetes educator? What equipment will I need to care for myself at home? What diabetes medicines do I need? When should I take them? How often do I need to check my blood glucose levels? What number can I call if I have questions? When is my follow-up visit? Where can I find a support group for people with diabetes? Where to find more information American Diabetes Association: www.diabetes.org Association of Diabetes Care and Education Specialists: www.diabeteseducator.org Contact a health care provider if: Your blood glucose is at or above 240 mg/dL (91.4 mmol/L) for 2 days in a row. You have been sick or have had a fever for 2 days or more, and you are not getting better. You have any of these problems for more than 6 hours: You cannot eat or drink. You feel nauseous. You vomit. You have diarrhea. Get help right away if: Your blood glucose is lower than 54 mg/dL (3 mmol/L). You get confused. You have trouble thinking clearly. You have trouble breathing. These symptoms may represent a serious problem that is an emergency. Do not wait to see if the symptoms will go away. Get medical help right away. Call your local emergency services (911 in the [place] Do not drive yourself to the hospital. Summary Diabetes mellitus is a chronic disease that occurs when the body does not properly use sugar (glucose) that is released from food after you eat. Take insulin and diabetes  medicines as told. Check your blood glucose every day, as often as told. Keep all follow-up visits. This is important. This information is not intended to replace advice given to you by your health care provider.  Make sure you discuss any questions you have with your health care provider. Document Revised: 09/18/2019 Document Reviewed: 09/18/2019 Elsevier Patient Education  2024 [employer].

## 2023-01-05 ENCOUNTER — Ambulatory Visit (INDEPENDENT_AMBULATORY_CARE_PROVIDER_SITE_OTHER): Payer: Medicare HMO | Admitting: Nurse Practitioner

## 2023-01-05 ENCOUNTER — Encounter: Payer: Self-pay | Admitting: Nurse Practitioner

## 2023-01-05 DIAGNOSIS — E1159 Type 2 diabetes mellitus with other circulatory complications: Secondary | ICD-10-CM | POA: Diagnosis not present

## 2023-01-05 DIAGNOSIS — D6869 Other thrombophilia: Secondary | ICD-10-CM

## 2023-01-05 DIAGNOSIS — E1122 Type 2 diabetes mellitus with diabetic chronic kidney disease: Secondary | ICD-10-CM | POA: Diagnosis not present

## 2023-01-05 DIAGNOSIS — E210 Primary hyperparathyroidism: Secondary | ICD-10-CM | POA: Diagnosis not present

## 2023-01-05 DIAGNOSIS — G4733 Obstructive sleep apnea (adult) (pediatric): Secondary | ICD-10-CM

## 2023-01-05 DIAGNOSIS — D631 Anemia in chronic kidney disease: Secondary | ICD-10-CM

## 2023-01-05 DIAGNOSIS — I340 Nonrheumatic mitral (valve) insufficiency: Secondary | ICD-10-CM

## 2023-01-05 DIAGNOSIS — E785 Hyperlipidemia, unspecified: Secondary | ICD-10-CM

## 2023-01-05 DIAGNOSIS — F321 Major depressive disorder, single episode, moderate: Secondary | ICD-10-CM | POA: Diagnosis not present

## 2023-01-05 DIAGNOSIS — E1169 Type 2 diabetes mellitus with other specified complication: Secondary | ICD-10-CM | POA: Diagnosis not present

## 2023-01-05 DIAGNOSIS — N184 Chronic kidney disease, stage 4 (severe): Secondary | ICD-10-CM | POA: Diagnosis not present

## 2023-01-05 DIAGNOSIS — I152 Hypertension secondary to endocrine disorders: Secondary | ICD-10-CM

## 2023-01-05 DIAGNOSIS — I480 Paroxysmal atrial fibrillation: Secondary | ICD-10-CM

## 2023-01-05 LAB — BAYER DCA HB A1C WAIVED: HB A1C (BAYER DCA - WAIVED): 5.9 % — ABNORMAL HIGH (ref 4.8–5.6)

## 2023-01-05 NOTE — Progress Notes (Signed)
BP 125/65   Pulse 76   Temp 97.9 F (36.6 C) (Oral)   Ht 5' (1.524 m)   Wt 205 lb 12.8 oz (93.4 kg)   SpO2 97%   BMI 40.19 kg/m    Subjective:    Patient ID: [name], female    DOB: January 24, 1951, 72 y.o.   MRN: [id number]  HPI: SENECA MOBBS is a 72 y.o. female  Chief Complaint  Patient presents with   Atrial Fibrillation   Diabetes   Chronic Kidney Disease   Hyperlipidemia   Hypertension    Pt states she was told by the pharmacy that they can no longer get her Diltiazem prescription. States she has been off of it for about a month now.    DIABETES A1c May 6.3%.  Taking Jardiance and Ozempic.  Work with [employer] pharmacist for cost assist with Jardiance, Eliquis, and Ozempic -- obtains through programs.  Has lost 11 pounds with Ozempic.  Does have gas and constipation at baseline + occasional explosive diarrhea -- saw GI last 12/23/21 -- was instructed to increase Miralax. Hypoglycemic episodes:no Polydipsia/polyuria: no Visual disturbance: no Chest pain: no Paresthesias: no Glucose Monitoring: yes             Accucheck frequency: Daily             Fasting glucose: 118 to 130 range             Post prandial:             Evening:             Before meals: Taking Insulin?: no             Long acting insulin:             Short acting insulin: Blood Pressure Monitoring: a few times a week Retinal Examination: Up To Date Foot Exam: Up to Date  Pneumovax: Up To Date  Influenza: Not up to Date - refuses Aspirin: no    HYPERTENSION / HYPERLIPIDEMIA/A-FIB Follows with cardiology, last 11/17/22 with no changes made.  Has attended visits with pulmonary, last 08/18/22 -- they ordered a new sleep study and she had visit with Dr. Welton Flakes on 10/18/22.  Is using CPAP.  Continues on Eliquis (gets via assistance), Atorvastatin, Diltiazem, Lasix, Olmesartan, Bisoprolol, Spironolactone, and Minoxidil.   Has not been able to get Cardizem for one month, not in stock at pharmacy -- taken  none of this in one month.  Last echo 11/26/20 with EF 60%.   Satisfied with current treatment? yes Duration of hypertension: chronic BP monitoring frequency: once a week BP range: <130/80 on average BP medication side effects: no Duration of hyperlipidemia: chronic Cholesterol medication side effects: no Cholesterol supplements: none Medication compliance: good compliance Aspirin: no Recent stressors: no Recurrent headaches: no Visual changes: no Palpitations: occasional, but very rare Dyspnea: ongoing with activity Chest pain: no Lower extremity edema: no Dizzy/lightheaded: occasional if gets up to fast  CHRONIC KIDNEY DISEASE (CKD 3b) Labs on 12/22/22 with nephrology = CRT 1.64, eGFR 33, PTH 93, H/H 11.4/34.9. CKD status: controlled Medications renally dose: yes Previous renal evaluation: no Pneumovax:  Up To Date Influenza Vaccine:  Not up to Date --refuses  DEPRESSION Continues on Duloxetine and Buspar daily.   Mood status: stable Satisfied with current treatment?: yes Symptom severity: moderate  Duration of current treatment : chronic Side effects: no Medication compliance: good compliance Psychotherapy/counseling: none Depressed mood: occasional Anxious mood: occasional Anhedonia:  no Significant weight loss or gain: no Insomnia: none Fatigue: no Feelings of worthlessness or guilt: no Impaired concentration/indecisiveness: no Suicidal ideations: no Hopelessness: no Crying spells: no    01/05/2023    9:49 AM 09/29/2022   10:00 AM 06/25/2022   10:05 AM 03/23/2022   10:06 AM 01/20/2022   11:43 AM  Depression screen PHQ 2/9  Decreased Interest 0 1 1 1  0  Down, Depressed, Hopeless 0 0 1 1 1   PHQ - 2 Score 0 1 2 2 1   Altered sleeping 2 1 1 1 2   Tired, decreased energy 1 1 1 1 2   Change in appetite 0 0 1 0 0  Feeling bad or failure about yourself  0 0 0 0 0  Trouble concentrating 0 0 0 1 0  Moving slowly or fidgety/restless 0 0 0 1 0  Suicidal thoughts 0 0 0  0 0  PHQ-9 Score 3 3 5 6 5   Difficult doing work/chores Not difficult at all Not difficult at all Not difficult at all Not difficult at all Not difficult at all       01/05/2023    9:50 AM 09/29/2022   10:01 AM 06/25/2022   10:06 AM 03/23/2022   10:07 AM  GAD 7 : Generalized Anxiety Score  Nervous, Anxious, on Edge 2 1 1 1   Control/stop worrying 1 0 0 1  Worry too much - different things 1 0 1 1  Trouble relaxing 1 0 1 1  Restless 0 0 0 0  Easily annoyed or irritable 0 0 1 0  Afraid - awful might happen 0 0 0 0  Total GAD 7 Score 5 1 4 4   Anxiety Difficulty Not difficult at all Not difficult at all Not difficult at all Not difficult at all   Relevant past medical, surgical, family and social history reviewed and updated as indicated. Interim medical history since our last visit reviewed. Allergies and medications reviewed and updated.  Review of Systems  Constitutional:  Negative for activity change, appetite change, diaphoresis, fatigue and fever.  Respiratory:  Positive for shortness of breath (occasional). Negative for cough, chest tightness and wheezing.   Cardiovascular:  Negative for chest pain, palpitations and leg swelling.  Gastrointestinal: Negative.   Endocrine: Negative for cold intolerance, heat intolerance, polydipsia, polyphagia and polyuria.  Neurological: Negative.   Psychiatric/Behavioral: Negative.     Per HPI unless specifically indicated above     Objective:    BP 125/65   Pulse 76   Temp 97.9 F (36.6 C) (Oral)   Ht 5' (1.524 m)   Wt 205 lb 12.8 oz (93.4 kg)   SpO2 97%   BMI 40.19 kg/m   Wt Readings from Last 3 Encounters:  01/05/23 205 lb 12.8 oz (93.4 kg)  10/18/22 203 lb (92.1 kg)  09/29/22 203 lb 14.4 oz (92.5 kg)    Physical Exam Vitals and nursing note reviewed. Exam conducted with a chaperone present.  Constitutional:      General: She is awake. She is not in acute distress.    Appearance: She is well-developed and well-groomed. She is  morbidly obese. She is not ill-appearing or toxic-appearing.  HENT:     Head: Normocephalic.     Right Ear: Hearing normal.     Left Ear: Hearing normal.  Eyes:     General: Lids are normal.        Right eye: No discharge.        Left eye: No discharge.  Conjunctiva/sclera: Conjunctivae normal.     Pupils: Pupils are equal, round, and reactive to light.  Neck:     Thyroid: No thyromegaly.     Vascular: No carotid bruit.  Cardiovascular:     Rate and Rhythm: Normal rate and regular rhythm.     Heart sounds: Murmur heard.     Systolic murmur is present with a grade of 2/6.     No gallop.  Pulmonary:     Effort: Pulmonary effort is normal. No accessory muscle usage or respiratory distress.     Breath sounds: No decreased breath sounds, wheezing or rhonchi.  Abdominal:     General: Bowel sounds are normal. There is no distension.     Palpations: Abdomen is soft.  Genitourinary:    Exam position: Lithotomy position.     Labia:        Right: No rash.        Left: No rash.   Musculoskeletal:     Cervical back: Normal range of motion and neck supple.     Right lower leg: No edema.     Left lower leg: No edema.  Lymphadenopathy:     Cervical: No cervical adenopathy.  Skin:    General: Skin is warm and dry.  Neurological:     Mental Status: She is alert and oriented to person, place, and time.     Deep Tendon Reflexes: Reflexes are normal and symmetric.     Reflex Scores:      Brachioradialis reflexes are 2+ on the right side and 2+ on the left side.      Patellar reflexes are 2+ on the right side and 2+ on the left side. Psychiatric:        Attention and Perception: Attention normal.        Mood and Affect: Mood normal.        Speech: Speech normal.        Behavior: Behavior normal. Behavior is cooperative.        Thought Content: Thought content normal.    Results for orders placed or performed in visit on 01/05/23  Bayer DCA Hb A1c Waived  Result Value Ref Range    HB A1C (BAYER DCA - WAIVED) 5.9 (H) 4.8 - 5.6 %      Assessment & Plan:   Problem List Items Addressed This Visit       Cardiovascular and Mediastinum   Hypertension associated with diabetes (HCC)    Chronic, stable.  BP well below goal in office and at home.  At this time will continue current regimen and adjust as needed based on BP and HR.  Continue collaboration with cardiology.  Recommend she monitor BP at least a few mornings a week at home and document.  DASH diet at home.  Labs: up to date with nephrology.  Spoke to Leanora Ivanoff PA-C with cardiology via secure chat -- okay to remain off Cardizem at this time due to BB on board and stable HR control + BP.  Return in 3 months.       Relevant Orders   Bayer DCA Hb A1c Waived (Completed)   TSH   Mitral regurgitation    Mild mitral and tricuspid regurgitation noted on echo June 2022.  Continue collaboration with cardiology and current medication regimen.      Paroxysmal atrial fibrillation (HCC)    Chronic, ongoing, followed by cardiology.  Continue Eliquis and Bisoprolol + collaboration with cardiology team.  Rate controlled.  Continue  to collaborate with CCM team.  Recent notes reviewed.  Spoke to Leanora Ivanoff PA-C with cardiology via secure chat -- okay to remain off Cardizem at this time due to BB on board and stable HR control + BP.        Respiratory   OSA (obstructive sleep apnea)    Chronic, ongoing.  Continue 100% adherence to CPAP use at night.  Recent note from Dr. Welton Flakes reviewed.        Endocrine   CKD stage 4 due to type 2 diabetes mellitus (HCC)    Chronic, ongoing CKD 3b.  At this time reduce and renal dose medications as needed + continue collaboration with nephrology.  Labs: up to date with nephrology.  Avoid NSAIDs.  Refer to diabetes with obesity plan of care for further.      Relevant Orders   Bayer DCA Hb A1c Waived (Completed)   Hyperlipidemia associated with type 2 diabetes mellitus (HCC)    Chronic,  ongoing.  Continue current medication regimen and adjust as needed.  Lipid panel on labs today.       Relevant Orders   Bayer DCA Hb A1c Waived (Completed)   Lipid Panel w/o Chol/HDL Ratio   Primary hyperparathyroidism (HCC)    Chronic, ongoing.  Continue collaboration with nephrology and current medication regimen. Recent labs performed by them and stable.      Type 2 diabetes mellitus with morbid obesity (HCC) - Primary    Chronic, ongoing with A1c 5.9% today, downward trend from 6.3% and urine ALB 80 January 2024.  Praised for ongoing success. - Continue Jardiance 25 MG and Ozempic 0.5 MG weekly at this time, as seeing significant benefit with this.  Gets via CCM assistance. - Maintain off Glimepiride due to her age and risk for hypoglycemia. Plus maintain off Metformin due to kidney health. - If poor control once at MAX doses of GLP1 and SGLT2, then will consider referral to endo, but at this time will continue to work on this in office which patient agrees with. - Check BS TID and focus on diabetic diet.    - Eye and foot exams up to date. - Refuses flu vaccine. - Return in 3 months for A1c -- but alert provider prior to if elevations in BS >300 or <70.        Relevant Orders   Bayer DCA Hb A1c Waived (Completed)     Genitourinary   Anemia in chronic kidney disease (CKD)    Ongoing.  Labs up to date with nephrology and stable.  Continue this collaboration, recent notes and labs reviewed.        Hematopoietic and Hemostatic   Other thrombophilia (HCC)    Related to a-fib and long term Eliquis.  Monitor CBC regularly and monitor skin for bruising or breakdown, notify provider if present.        Other   Depression, major, single episode, moderate (HCC)    Chronic, stable.   Denies SI/HI.  Continue Duloxetine at this time, which also offers benefit to chronic pain and mood, + continue Buspar which has offered her benefit to anxiety.  Refills as needed.  Will adjust dosing or  add medication as needed.  Return in 3 months.      Morbid obesity (HCC)    BMI 40.19 with T2DM, HTN, CKD with no further loss at this time.  Recommended eating smaller high protein, low fat meals more frequently and exercising 30 mins a day 5 times a  week with a goal of 10-15lb weight loss in the next 3 months. Patient voiced their understanding and motivation to adhere to these recommendations.         Follow up plan: Return in about 3 months (around 04/07/2023) for T2DM, HTN/HLD, A-FIB, MOOD, CKD.

## 2023-01-05 NOTE — Assessment & Plan Note (Signed)
Chronic, stable.   Denies SI/HI.  Continue Duloxetine at this time, which also offers benefit to chronic pain and mood, + continue Buspar which has offered her benefit to anxiety.  Refills as needed.  Will adjust dosing or add medication as needed.  Return in 3 months. 

## 2023-01-05 NOTE — Assessment & Plan Note (Signed)
Chronic, stable.  BP well below goal in office and at home.  At this time will continue current regimen and adjust as needed based on BP and HR.  Continue collaboration with cardiology.  Recommend she monitor BP at least a few mornings a week at home and document.  DASH diet at home.  Labs: up to date with nephrology.  Spoke to Leanora Ivanoff PA-C with cardiology via secure chat -- okay to remain off Cardizem at this time due to BB on board and stable HR control + BP.  Return in 3 months.

## 2023-01-05 NOTE — Assessment & Plan Note (Signed)
Chronic, ongoing with A1c 5.9% today, downward trend from 6.3% and urine ALB 80 January 2024.  Praised for ongoing success. - Continue Jardiance 25 MG and Ozempic 0.5 MG weekly at this time, as seeing significant benefit with this.  Gets via CCM assistance. - Maintain off Glimepiride due to her age and risk for hypoglycemia. Plus maintain off Metformin due to kidney health. - If poor control once at MAX doses of GLP1 and SGLT2, then will consider referral to endo, but at this time will continue to work on this in office which patient agrees with. - Check BS TID and focus on diabetic diet.    - Eye and foot exams up to date. - Refuses flu vaccine. - Return in 3 months for A1c -- but alert provider prior to if elevations in BS >300 or <70.

## 2023-01-05 NOTE — Assessment & Plan Note (Signed)
BMI 40.19 with T2DM, HTN, CKD with no further loss at this time.  Recommended eating smaller high protein, low fat meals more frequently and exercising 30 mins a day 5 times a week with a goal of 10-15lb weight loss in the next 3 months. Patient voiced their understanding and motivation to adhere to these recommendations.

## 2023-01-05 NOTE — Assessment & Plan Note (Signed)
Mild mitral and tricuspid regurgitation noted on echo June 2022.  Continue collaboration with cardiology and current medication regimen.

## 2023-01-05 NOTE — Assessment & Plan Note (Signed)
Ongoing.  Labs up to date with nephrology and stable.  Continue this collaboration, recent notes and labs reviewed. 

## 2023-01-05 NOTE — Assessment & Plan Note (Addendum)
Chronic, ongoing CKD 3b.  At this time reduce and renal dose medications as needed + continue collaboration with nephrology.  Labs: up to date with nephrology.  Avoid NSAIDs.  Refer to diabetes with obesity plan of care for further.

## 2023-01-05 NOTE — Assessment & Plan Note (Signed)
Chronic, ongoing.  Continue collaboration with nephrology and current medication regimen. Recent labs performed by them and stable.

## 2023-01-05 NOTE — Assessment & Plan Note (Signed)
Related to a-fib and long term Eliquis.  Monitor CBC regularly and monitor skin for bruising or breakdown, notify provider if present. 

## 2023-01-05 NOTE — Assessment & Plan Note (Signed)
Chronic, ongoing.  Continue current medication regimen and adjust as needed.  Lipid panel on labs today.

## 2023-01-05 NOTE — Assessment & Plan Note (Signed)
Chronic, ongoing, followed by cardiology.  Continue Eliquis and Bisoprolol + collaboration with cardiology team.  Rate controlled.  Continue to collaborate with CCM team.  Recent notes reviewed.  Spoke to Leanora Ivanoff PA-C with cardiology via secure chat -- okay to remain off Cardizem at this time due to BB on board and stable HR control + BP.

## 2023-01-05 NOTE — Assessment & Plan Note (Signed)
Chronic, ongoing.  Continue 100% adherence to CPAP use at night.  Recent note from Dr. Welton Flakes reviewed.

## 2023-01-06 NOTE — Progress Notes (Signed)
Patient notified of Jolene's message.   °

## 2023-01-06 NOTE — Progress Notes (Signed)
Good morning, please let Cindy [name] know all of her labs (cholesterol and thyroid check) returned normal.  No changes needed to medications.  Great job!! Keep being amazing!!  Thank you for allowing me to participate in your care.  I appreciate you. Kindest regards, 

## 2023-01-10 ENCOUNTER — Other Ambulatory Visit: Payer: Self-pay | Admitting: Nurse Practitioner

## 2023-01-10 ENCOUNTER — Ambulatory Visit: Payer: Self-pay

## 2023-01-10 NOTE — Telephone Encounter (Signed)
Chief Complaint: Dizziness and medication question  Symptoms: Dizziness that comes and goes Frequency: ongoing  Pertinent Negatives: Patient denies felling fiant, headache, nausea Disposition: [] ED /[] Urgent Care (no appt availability in office) / [] Appointment(In office/virtual)/ []  [facility]/ [] Home Care/ [] Refused Recommended Disposition /[] [place] [facility]/ [x]  Follow-up with PCP Additional Notes: Patient stated that she has been feeling dizzy and she believes it is because she has been off her Diltiazem 24hr capsule 360MG  for over a month now. Patient stated the pharmacy told her that the 360mg  dose is on back order but advised the patient to request Dilitazem 180MG  2 capsule daily be sent to the pharmacy because that is in stock. Patient request Rx be sent to her pharmacy. Advised patient that I would forward message to provider for additional recommendation.  Summary: Dizzy advice   Pt is calling to report that she is dizzy. She think that it is her sinuses. Please advise     Reason for Disposition  [1] MODERATE dizziness (e.g., interferes with normal activities) AND [2] has been evaluated by doctor (or NP/PA) for this  Answer Assessment - Initial Assessment Questions 1. DESCRIPTION: "Describe your dizziness."     A little lightheaded and off balanced  2. LIGHTHEADED: "Do you feel lightheaded?" (e.g., somewhat faint, woozy, weak upon standing)     Sometimes  3. VERTIGO: "Do you feel like either you or the room is spinning or tilting?" (i.e. vertigo)     No 4. SEVERITY: "How bad is it?"  "Do you feel like you are going to faint?" "Can you stand and walk?"   - MILD: Feels slightly dizzy, but walking normally.   - MODERATE: Feels unsteady when walking, but not falling; interferes with normal activities (e.g., school, work).   - SEVERE: Unable to walk without falling, or requires assistance to walk without falling; feels like passing out now.      Moderated  5. ONSET:   "When did the dizziness begin?"     Yesterday 6. AGGRAVATING FACTORS: "Does anything make it worse?" (e.g., standing, change in head position)     Yes of I turn to left too fast      8. CAUSE: "What do you think is causing the dizziness?"     I think its from sinus problems 9. RECURRENT SYMPTOM: "Have you had dizziness before?" If Yes, ask: "When was the last time?" "What happened that time?"     Sometimes I do it comes and goes 10. OTHER SYMPTOMS: "Do you have any other symptoms?" (e.g., fever, chest pain, vomiting, diarrhea, bleeding)       Sometimes neck pain  Protocols used: Dizziness - Lightheadedness-A-AH

## 2023-01-10 NOTE — Telephone Encounter (Signed)
Medication Refill - Medication: Medication diltiazem (CARDIZEM CD) 360 MG 24 hr capsule [29278] diltiazem (CARDIZEM CD) 360 MG 24 hr capsule [[id number]]  DISCONTINUED   Pt is calling to report that the pharmacy does not have  360 but can take 180  Has the patient contacted their pharmacy? Yes.   (Agent: If no, request that the patient contact the pharmacy for the refill. If patient does not wish to contact the pharmacy document the reason why and proceed with request.) (Agent: If yes, when and what did the pharmacy advise?)  Preferred Pharmacy (with phone number or street name):  [id number] - [place], [place] - [address] [place] [address] AT [facility] [address] [facility] [address] Phone: [phone]  Fax: [phone]     Has the patient been seen for an appointment in the last year OR does the patient have an upcoming appointment? Yes.    Agent: Please be advised that RX refills may take up to 3 business days. We ask that you follow-up with your pharmacy.

## 2023-01-10 NOTE — Telephone Encounter (Signed)
Patient may need prescription for diltiazem 360mg . Patient states the pharmacy dose not have 360 mg, but dose have 180 mg. Routing for approval.

## 2023-01-10 NOTE — Telephone Encounter (Signed)
Please call and schedule the patient an appointment per Jolene to assess dizziness.

## 2023-01-11 NOTE — Telephone Encounter (Signed)
Routing to provider. According to last notes, patient was not to continue this medication due to her BP levels. Does she need to take it for her heart? Please advise on what to tell the patient.

## 2023-01-11 NOTE — Telephone Encounter (Signed)
Called patient  and she stated that she feels better she get stopped up un in the morning and it gets better before the end of the day dizziness was not the issue she does not need to come into the office. She stated the wants to restart the Diltiazem 180mg   and would like to know what it is for from her understanding that it was for her heart and thought that she should be taking it.Patient would like the medical team to call her and explain.

## 2023-01-11 NOTE — Telephone Encounter (Signed)
Called and spoke with patient. Explained to patient that Jolene spoke with her cardiologist and both agreed that she could stay off of the medication. Patient states that she is ok with staying off of the medication as long as bother of her providers agree that she is ok to not take the medication. Patient states she is ok with not having the medication sent in at this time.

## 2023-01-19 ENCOUNTER — Other Ambulatory Visit: Payer: Medicare HMO

## 2023-01-19 ENCOUNTER — Other Ambulatory Visit: Payer: Self-pay

## 2023-01-19 NOTE — Patient Outreach (Signed)
Care Management   Visit Note  01/19/2023 Name: Cindy [name] MRN: [id number] DOB: [birth year]  Subjective: Cindy [name] is a 72 y.o. year old female who is a primary care patient of Cannady, Dorie Rank, NP. The Care Management team was consulted for assistance.      Engaged with patient spoke with patient by telephone.    Goals Addressed             This Visit's Progress    RNCM Care Management Expected Outcome:  Monitor, Self-Manage and Reduce Symptoms of Afib       Current Barriers:  Chronic Disease Management support and education needs related to effective management of AFIB  Planned Interventions: Provider order and care plan reviewed. Collaborated with PharmD regarding patient care and plan. The patient is working with the pharm D for assistance for getting Eliquis. The patient states she has been approved for the Eliquis and is thankful that she has this. She denies any new issues with her AFIB and heart health. Counseled on increased risk of stroke due to Afib and benefits of anticoagulation for stroke prevention. Denies any safety concerns or new issues related to increased blood pressures or changes that put her at increase risk of HA or stroke           Reviewed importance of adherence to anticoagulant exactly as prescribed. The patient is complaint with medications. Is getting Eliquis and taking as prescribed. Works with pharm D on a regular basis. Advised patient to discuss changes in her heart rate or AFIB with provider Counseled on bleeding risk associated with AFIB and importance of self-monitoring for signs/symptoms of bleeding Counseled on avoidance of NSAIDs due to increased bleeding risk with anticoagulants Counseled on importance of regular laboratory monitoring as prescribed. Has lab work on a regular basis. The patient is complaint Counseled on seeking medical attention after a head injury or if there is blood in the urine/stool. Review of being safe and  watching for potential fall risk.  Afib action plan reviewed Screening for signs and symptoms of depression related to chronic disease state Assessed social determinant of health barriers Is using over the counter pain patches. The patient states she is doing okay. She paces her activity and monitors for changes.  Symptom Management: Take medications as prescribed   Attend all scheduled provider appointments Call provider office for new concerns or questions  call the Suicide and Crisis Lifeline: 988 call the [place] National Suicide Prevention Lifeline: 410 323 5844 or TTY: 210-852-9634 TTY 667-016-4298) to talk to a trained counselor call 1-800-273-TALK (toll free, 24 hour hotline) if experiencing a Mental Health or Behavioral Health Crisis  - make a plan to eat healthy - keep all lab appointments - take medicine as prescribed  Follow Up Plan: Telephone follow up appointment with care management team member scheduled for: 03-23-2023 at 1 pm       RNCM Care Management Expected Outcome:  Monitor, Self-Manage and Reduce Symptoms of Diabetes       Current Barriers:  Chronic Disease Management support and education needs related to effective management of DM Financial Constraints.  Lab Results  Component Value Date   HGBA1C 5.9 (H) 01/05/2023     Planned Interventions: Provided education to patient about basic DM disease process. Review and education provided. The patient states that she is happy with the way she is managing her DM. She is wanting to lose more weight.  Discussed losing weight slowly. He DM is stable and she denies  any new concerns. She is happy that her her A1C is stable and staying low. ; Reviewed medications with patient and discussed importance of medication adherence. The patient is working with the pharm D for ongoing support and education and medication management. Has paperwork to submit for medication assistance. Has her medications. Denies any acute changes. Will  continue to monitor for changes or new needs.  Reviewed prescribed diet with patient heart healthy/ADA diet. Review and education provided. The patient states that she is watching her portion size; Counseled on importance of regular laboratory monitoring as prescribed. Has lab work on a regular basis. Review of recent labwork and recommendations by the pcp for Vitamin D3. She takes calcitrol 25 mcg. Encouraged her to pick up an additional 1000 units Vitamin D3.   The patient verbalized understanding.      Discussed plans with patient for ongoing care management follow up and provided patient with direct contact information for care management team;      Provided patient with written educational materials related to hypo and hyperglycemia and importance of correct treatment;       Reviewed scheduled/upcoming provider appointments including: 04-13-2023 at 10 am;         Advised patient, providing education and rationale, to check cbg once daily and when you have symptoms of low or high blood sugar and record. The patient states that her blood sugars have been around 115 to 140. She is pleased with the numbers she is getting.  The patient denies any hypoglycemic events       Review of patient status, including review of consultants reports, relevant laboratory and other test results, and medications completed. Has labs on a regular basis.;       Advised patient to discuss changes in DM or questions or concerns with provider;      Screening for signs and symptoms of depression related to chronic disease state;        Assessed social determinant of health barriers;     She is having some pain in her feet and it may be neuropathy. Education provided. Will continue to monitor for changes or new needs.     Symptom Management: Take medications as prescribed   Attend all scheduled provider appointments Call provider office for new concerns or questions  call the Suicide and Crisis Lifeline: 988 call the  [place] National Suicide Prevention Lifeline: 629-016-8719 or TTY: 236-703-0090 TTY 608-348-5246) to talk to a trained counselor call 1-800-273-TALK (toll free, 24 hour hotline) if experiencing a Mental Health or Behavioral Health Crisis  check feet daily for cuts, sores or redness trim toenails straight across manage portion size wash and dry feet carefully every day wear comfortable, cotton socks wear comfortable, well-fitting shoes  Follow Up Plan: Telephone follow up appointment with care management team member scheduled for: 03-23-2023 at 1 pm       RNCM Care Management Expected Outcome:  Monitor, Self-Manage and Reduce Symptoms of: CKD       Current Barriers:  Knowledge Deficits related to importance of kidney protection and monitoring for dehydration or changes in conditions that may impact kidney function Chronic Disease Management support and education needs related to effective management of CKD EGFR at 30  Planned Interventions: Assessed the patient  understanding of chronic kidney disease. The patient has a good understanding of her CKD. The patient states that her CKD is stable. She states that they are monitoring her closely and she is following the plan of care. Knows when  to call the provider for changes.  Kidney function stable  at this time. She states not taking the Metformin has improved her kidney function. Denies any new concerns. Evaluation of current treatment plan related to chronic kidney disease self management and patient's adherence to plan as established by provider      Provided education to patient re: stroke prevention, s/s of heart attack and stroke    Reviewed prescribed diet heart healthy/ADA diet. Is compliant with dietary restrictions. Is monitoring for changes in her diet. Reviewed medications with patient and discussed importance of compliance. The patient is compliant with medications. Denies any issues with medications at this time.     Advised  patient, providing education and rationale, to monitor blood pressure daily and record, calling PCP for findings outside established parameters. Blood pressures are more stable and doing good. The patient states she is feeling better.     Discussed complications of poorly controlled blood pressure such as heart disease, stroke, circulatory complications, vision complications, kidney impairment, sexual dysfunction    Reviewed scheduled/upcoming provider appointments including: 04-13-2023 at 10 am.  Advised patient to discuss changes in her kidney function, questions or concerns with provider    Discussed plans with patient for ongoing care management follow up and provided patient with direct contact information for care management team    Screening for signs and symptoms of depression related to chronic disease state      Discussed the impact of chronic kidney disease on daily life and mental health and acknowledged and normalized feelings of disempowerment, fear, and frustration    Assessed social determinant of health barriers    Provided education on kidney disease progression    Engage patient in early, proactive and ongoing discussion about goals of care and what matters most to them    Support coping and stress management by recognizing current strategies and assist in developing new strategies such as mindfulness, journaling, relaxation techniques, problem-solving     Symptom Management: Take medications as prescribed   Attend all scheduled provider appointments Call provider office for new concerns or questions  call the Suicide and Crisis Lifeline: 988 call the [place] National Suicide Prevention Lifeline: 214 246 7219 or TTY: 743-003-7102 TTY 432-097-1209) to talk to a trained counselor call 1-800-273-TALK (toll free, 24 hour hotline) if experiencing a Mental Health or Behavioral Health Crisis   Follow Up Plan: Telephone follow up appointment with care management team member scheduled  for: 03-23-2023 at 1 pm       RNCM Care Management Expected Outcome:  Monitor, Self-Manage, and Reduce Symptoms of Hypertension       Current Barriers:  Chronic Disease Management support and education needs related to effective management of HTN BP Readings from Last 3 Encounters:  01/05/23 125/65  10/18/22 (!) 149/62  09/29/22 122/64     Planned Interventions: Evaluation of current treatment plan related to hypertension self management and patient's adherence to plan as established by provider. The patient states that her blood pressures are stable and she is following the plan of care. Denies any elevations in her blood pressures. States that her HTN and heart health are stable. She is thankful she is feeling better. The patient denies any acute findings today. Provided education to patient re: stroke prevention, s/s of heart attack and stroke; Reviewed prescribed diet heart healthy/ADA diet. Education provided on heart healthy/ADA diet. The patient wants to lose weight. She is watching her dietary intake and has a goal of getting to 150 pounds.  Currently  her weight is 205. She denies any new concerns with her dietary restrictions Reviewed medications with patient and discussed importance of compliance. The patient is compliant with medications. Working with the pharm D for management of medications and PAP assistance. Continues to work with pharm D. Denies any issues with medications at this time. ;  Discussed plans with patient for ongoing care management follow up and provided patient with direct contact information for care management team; Advised patient, providing education and rationale, to monitor blood pressure daily and record, calling PCP for findings outside established parameters;  Reviewed scheduled/upcoming provider appointments including: 04-13-2023 at 1000 am,  Advised patient to discuss changes in blood pressure or heart health with provider; Provided education on  prescribed diet heart healthy/ADA diet ;  Discussed complications of poorly controlled blood pressure such as heart disease, stroke, circulatory complications, vision complications, kidney impairment, sexual dysfunction;  Screening for signs and symptoms of depression related to chronic disease state;  Assessed social determinant of health barriers;  Is thankful that her bowels are doing much better. She is on a regimen that is working well for her. She says her abdomen feels much better.   Symptom Management: Take medications as prescribed   Attend all scheduled provider appointments Call provider office for new concerns or questions  call the Suicide and Crisis Lifeline: 988 call the [place] National Suicide Prevention Lifeline: (938)724-5768 or TTY: 551-570-9523 TTY 540-606-9278) to talk to a trained counselor call 1-800-273-TALK (toll free, 24 hour hotline) if experiencing a Mental Health or Behavioral Health Crisis  check blood pressure weekly call doctor for signs and symptoms of high blood pressure develop an action plan for high blood pressure keep all doctor appointments take medications for blood pressure exactly as prescribed report new symptoms to your doctor  Follow Up Plan: Telephone follow up appointment with care management team member scheduled for: 03-23-2023 at 1 pm          Consent to Services:  Patient was given information about care management services, agreed to services, and gave verbal consent to participate.   Plan: Telephone follow up appointment with care management team member scheduled for: 03-23-2023 at 1 pm  Alto Denver RN, MSN, CCM RN Care Manager  [facility]  Ambulatory Care Management  Direct Number: [phone]

## 2023-01-19 NOTE — Patient Instructions (Signed)
Visit Information  Thank you for taking time to visit with me today. Please don't hesitate to contact me if I can be of assistance to you before our next scheduled telephone appointment.  Following are the goals we discussed today:   Goals Addressed             This Visit's Progress    RNCM Care Management Expected Outcome:  Monitor, Self-Manage and Reduce Symptoms of Afib       Current Barriers:  Chronic Disease Management support and education needs related to effective management of AFIB  Planned Interventions: Provider order and care plan reviewed. Collaborated with PharmD regarding patient care and plan. The patient is working with the pharm D for assistance for getting Eliquis. The patient states she has been approved for the Eliquis and is thankful that she has this. She denies any new issues with her AFIB and heart health. Counseled on increased risk of stroke due to Afib and benefits of anticoagulation for stroke prevention. Denies any safety concerns or new issues related to increased blood pressures or changes that put her at increase risk of HA or stroke           Reviewed importance of adherence to anticoagulant exactly as prescribed. The patient is complaint with medications. Is getting Eliquis and taking as prescribed. Works with pharm D on a regular basis. Advised patient to discuss changes in her heart rate or AFIB with provider Counseled on bleeding risk associated with AFIB and importance of self-monitoring for signs/symptoms of bleeding Counseled on avoidance of NSAIDs due to increased bleeding risk with anticoagulants Counseled on importance of regular laboratory monitoring as prescribed. Has lab work on a regular basis. The patient is complaint Counseled on seeking medical attention after a head injury or if there is blood in the urine/stool. Review of being safe and watching for potential fall risk.  Afib action plan reviewed Screening for signs and symptoms of  depression related to chronic disease state Assessed social determinant of health barriers Is using over the counter pain patches. The patient states she is doing okay. She paces her activity and monitors for changes.  Symptom Management: Take medications as prescribed   Attend all scheduled provider appointments Call provider office for new concerns or questions  call the Suicide and Crisis Lifeline: 988 call the [place] National Suicide Prevention Lifeline: 671-110-7846 or TTY: (579)753-2275 TTY (423)075-9051) to talk to a trained counselor call 1-800-273-TALK (toll free, 24 hour hotline) if experiencing a Mental Health or Behavioral Health Crisis  - make a plan to eat healthy - keep all lab appointments - take medicine as prescribed  Follow Up Plan: Telephone follow up appointment with care management team member scheduled for: 03-23-2023 at 1 pm       RNCM Care Management Expected Outcome:  Monitor, Self-Manage and Reduce Symptoms of Diabetes       Current Barriers:  Chronic Disease Management support and education needs related to effective management of DM Financial Constraints.  Lab Results  Component Value Date   HGBA1C 5.9 (H) 01/05/2023     Planned Interventions: Provided education to patient about basic DM disease process. Review and education provided. The patient states that she is happy with the way she is managing her DM. She is wanting to lose more weight.  Discussed losing weight slowly. He DM is stable and she denies any new concerns. She is happy that her her A1C is stable and staying low. ; Reviewed medications with patient  and discussed importance of medication adherence. The patient is working with the pharm D for ongoing support and education and medication management. Has paperwork to submit for medication assistance. Has her medications. Denies any acute changes. Will continue to monitor for changes or new needs.  Reviewed prescribed diet with patient heart  healthy/ADA diet. Review and education provided. The patient states that she is watching her portion size; Counseled on importance of regular laboratory monitoring as prescribed. Has lab work on a regular basis. Review of recent labwork and recommendations by the pcp for Vitamin D3. She takes calcitrol 25 mcg. Encouraged her to pick up an additional 1000 units Vitamin D3.   The patient verbalized understanding.      Discussed plans with patient for ongoing care management follow up and provided patient with direct contact information for care management team;      Provided patient with written educational materials related to hypo and hyperglycemia and importance of correct treatment;       Reviewed scheduled/upcoming provider appointments including: 04-13-2023 at 10 am;         Advised patient, providing education and rationale, to check cbg once daily and when you have symptoms of low or high blood sugar and record. The patient states that her blood sugars have been around 115 to 140. She is pleased with the numbers she is getting.  The patient denies any hypoglycemic events       Review of patient status, including review of consultants reports, relevant laboratory and other test results, and medications completed. Has labs on a regular basis.;       Advised patient to discuss changes in DM or questions or concerns with provider;      Screening for signs and symptoms of depression related to chronic disease state;        Assessed social determinant of health barriers;     She is having some pain in her feet and it may be neuropathy. Education provided. Will continue to monitor for changes or new needs.     Symptom Management: Take medications as prescribed   Attend all scheduled provider appointments Call provider office for new concerns or questions  call the Suicide and Crisis Lifeline: 988 call the [place] National Suicide Prevention Lifeline: 386-783-0863 or TTY: 605-849-5510 TTY  (914)247-7963) to talk to a trained counselor call 1-800-273-TALK (toll free, 24 hour hotline) if experiencing a Mental Health or Behavioral Health Crisis  check feet daily for cuts, sores or redness trim toenails straight across manage portion size wash and dry feet carefully every day wear comfortable, cotton socks wear comfortable, well-fitting shoes  Follow Up Plan: Telephone follow up appointment with care management team member scheduled for: 03-23-2023 at 1 pm       RNCM Care Management Expected Outcome:  Monitor, Self-Manage and Reduce Symptoms of: CKD       Current Barriers:  Knowledge Deficits related to importance of kidney protection and monitoring for dehydration or changes in conditions that may impact kidney function Chronic Disease Management support and education needs related to effective management of CKD EGFR at 30  Planned Interventions: Assessed the patient  understanding of chronic kidney disease. The patient has a good understanding of her CKD. The patient states that her CKD is stable. She states that they are monitoring her closely and she is following the plan of care. Knows when to call the provider for changes.  Kidney function stable  at this time. She states not taking the Metformin  has improved her kidney function. Denies any new concerns. Evaluation of current treatment plan related to chronic kidney disease self management and patient's adherence to plan as established by provider      Provided education to patient re: stroke prevention, s/s of heart attack and stroke    Reviewed prescribed diet heart healthy/ADA diet. Is compliant with dietary restrictions. Is monitoring for changes in her diet. Reviewed medications with patient and discussed importance of compliance. The patient is compliant with medications. Denies any issues with medications at this time.     Advised patient, providing education and rationale, to monitor blood pressure daily and record,  calling PCP for findings outside established parameters. Blood pressures are more stable and doing good. The patient states she is feeling better.     Discussed complications of poorly controlled blood pressure such as heart disease, stroke, circulatory complications, vision complications, kidney impairment, sexual dysfunction    Reviewed scheduled/upcoming provider appointments including: 04-13-2023 at 10 am.  Advised patient to discuss changes in her kidney function, questions or concerns with provider    Discussed plans with patient for ongoing care management follow up and provided patient with direct contact information for care management team    Screening for signs and symptoms of depression related to chronic disease state      Discussed the impact of chronic kidney disease on daily life and mental health and acknowledged and normalized feelings of disempowerment, fear, and frustration    Assessed social determinant of health barriers    Provided education on kidney disease progression    Engage patient in early, proactive and ongoing discussion about goals of care and what matters most to them    Support coping and stress management by recognizing current strategies and assist in developing new strategies such as mindfulness, journaling, relaxation techniques, problem-solving     Symptom Management: Take medications as prescribed   Attend all scheduled provider appointments Call provider office for new concerns or questions  call the Suicide and Crisis Lifeline: 988 call the [place] National Suicide Prevention Lifeline: 919-101-1699 or TTY: (479)867-8128 TTY (458) 044-5746) to talk to a trained counselor call 1-800-273-TALK (toll free, 24 hour hotline) if experiencing a Mental Health or Behavioral Health Crisis   Follow Up Plan: Telephone follow up appointment with care management team member scheduled for: 03-23-2023 at 1 pm       RNCM Care Management Expected Outcome:  Monitor,  Self-Manage, and Reduce Symptoms of Hypertension       Current Barriers:  Chronic Disease Management support and education needs related to effective management of HTN BP Readings from Last 3 Encounters:  01/05/23 125/65  10/18/22 (!) 149/62  09/29/22 122/64     Planned Interventions: Evaluation of current treatment plan related to hypertension self management and patient's adherence to plan as established by provider. The patient states that her blood pressures are stable and she is following the plan of care. Denies any elevations in her blood pressures. States that her HTN and heart health are stable. She is thankful she is feeling better. The patient denies any acute findings today. Provided education to patient re: stroke prevention, s/s of heart attack and stroke; Reviewed prescribed diet heart healthy/ADA diet. Education provided on heart healthy/ADA diet. The patient wants to lose weight. She is watching her dietary intake and has a goal of getting to 150 pounds.  Currently her weight is 205. She denies any new concerns with her dietary restrictions Reviewed medications with patient and discussed importance  of compliance. The patient is compliant with medications. Working with the pharm D for management of medications and PAP assistance. Continues to work with pharm D. Denies any issues with medications at this time. ;  Discussed plans with patient for ongoing care management follow up and provided patient with direct contact information for care management team; Advised patient, providing education and rationale, to monitor blood pressure daily and record, calling PCP for findings outside established parameters;  Reviewed scheduled/upcoming provider appointments including: 04-13-2023 at 1000 am,  Advised patient to discuss changes in blood pressure or heart health with provider; Provided education on prescribed diet heart healthy/ADA diet ;  Discussed complications of poorly controlled  blood pressure such as heart disease, stroke, circulatory complications, vision complications, kidney impairment, sexual dysfunction;  Screening for signs and symptoms of depression related to chronic disease state;  Assessed social determinant of health barriers;  Is thankful that her bowels are doing much better. She is on a regimen that is working well for her. She says her abdomen feels much better.   Symptom Management: Take medications as prescribed   Attend all scheduled provider appointments Call provider office for new concerns or questions  call the Suicide and Crisis Lifeline: 988 call the [place] National Suicide Prevention Lifeline: (684)473-1843 or TTY: 225 585 9039 TTY 910-646-9218) to talk to a trained counselor call 1-800-273-TALK (toll free, 24 hour hotline) if experiencing a Mental Health or Behavioral Health Crisis  check blood pressure weekly call doctor for signs and symptoms of high blood pressure develop an action plan for high blood pressure keep all doctor appointments take medications for blood pressure exactly as prescribed report new symptoms to your doctor  Follow Up Plan: Telephone follow up appointment with care management team member scheduled for: 03-23-2023 at 1 pm           Our next appointment is by telephone on 03-23-2023 at 1 pm  Please call the care guide team at [phone] if you need to cancel or reschedule your appointment.   If you are experiencing a Mental Health or Behavioral Health Crisis or need someone to talk to, please call the Suicide and Crisis Lifeline: 988 call the [place] National Suicide Prevention Lifeline: 432-148-3593 or TTY: (386)817-1855 TTY 432-855-2866) to talk to a trained counselor call 1-800-273-TALK (toll free, 24 hour hotline)   The patient verbalized understanding of instructions, educational materials, and care plan provided today and DECLINED offer to receive copy of patient instructions, educational materials,  and care plan.   Telephone follow up appointment with care management team member scheduled for: 03-23-2023 at 1 pm  Alto Denver RN, MSN, CCM RN Care Manager  [facility]  Ambulatory Care Management  Direct Number: [phone]

## 2023-02-01 ENCOUNTER — Ambulatory Visit (INDEPENDENT_AMBULATORY_CARE_PROVIDER_SITE_OTHER): Payer: Medicare HMO | Admitting: Emergency Medicine

## 2023-02-01 VITALS — Ht 61.0 in | Wt 205.0 lb

## 2023-02-01 DIAGNOSIS — Z0000 Encounter for general adult medical examination without abnormal findings: Secondary | ICD-10-CM

## 2023-02-01 NOTE — Patient Instructions (Addendum)
Ms. Ritchie , Thank you for taking time to come for your Medicare Wellness Visit. I appreciate your ongoing commitment to your health goals. Please review the following plan we discussed and let me know if I can assist you in the future.   Referrals/Orders/Follow-Ups/Clinician Recommendations: You are due for a diabetic foot exam. You can get this at your next OV on 04/13/23 with Aura Dials, NP.  This is a list of the screening recommended for you and due dates:  Health Maintenance  Topic Date Due   Eye exam for diabetics  Never done   Zoster (Shingles) Vaccine (1 of 2) Never done   COVID-19 Vaccine (3 - Moderna risk series) 01/13/2021   Complete foot exam   12/22/2022   Flu Shot  Never done   Yearly kidney function blood test for diabetes  04/06/2023   Yearly kidney health urinalysis for diabetes  06/26/2023   Hemoglobin A1C  07/08/2023   Medicare Annual Wellness Visit  02/01/2024   Mammogram  03/03/2024   Colon Cancer Screening  04/27/2027   DEXA scan (bone density measurement)  04/28/2031   DTaP/Tdap/Td vaccine (2 - Tdap) 12/22/2031   Pneumonia Vaccine  Completed   Hepatitis C Screening  Completed   HPV Vaccine  Aged Out    Advanced directives: (Declined) Advance directive discussed with you today. Even though you declined this today, please call our office should you change your mind, and we can give you the proper paperwork for you to fill out.  Next Medicare Annual Wellness Visit scheduled for next year: Yes, 02/07/24 @ 1:30 pm

## 2023-02-01 NOTE — Progress Notes (Signed)
Subjective:   Cindy [name] is a 72 y.o. female who presents for Medicare Annual (Subsequent) preventive examination.  Visit Complete: Virtual  I connected with  [name] on 02/01/23 by a audio enabled telemedicine application and verified that I am speaking with the correct person using two identifiers.  Patient Location: Home  Provider Location: Home Office  I discussed the limitations of evaluation and management by telemedicine. The patient expressed understanding and agreed to proceed.  Vital Signs: Because this visit was a virtual/telehealth visit, some criteria may be missing or patient reported. Any vitals not documented were not able to be obtained and vitals that have been documented are patient reported.    Review of Systems     Cardiac Risk Factors include: advanced age (>73men, >75 women);obesity (BMI >30kg/m2);diabetes mellitus;hypertension;dyslipidemia;Other (see comment), Risk factor comments: OSA (cpap)     Objective:    Today's Vitals   02/01/23 1457  Weight: 205 lb (93 kg)  Height: 5\' 1"  (1.549 m)   Body mass index is 38.73 kg/m.     02/01/2023    3:21 PM 04/05/2022    9:02 AM 01/20/2022   11:35 AM 01/19/2021    2:05 PM 11/19/2020    8:32 PM 11/19/2020    5:46 PM 07/15/2020    8:48 PM  Advanced Directives  Does Patient Have a Medical Advance Directive? No No No No No No No  Would patient like information on creating a medical advance directive? No - Patient declined No - Patient declined   No - Patient declined  No - Patient declined    Current Medications (verified) Outpatient Encounter Medications as of 02/01/2023  Medication Sig   acetaminophen (TYLENOL) 650 MG CR tablet Take 650 mg by mouth every 8 (eight) hours as needed for pain.   albuterol (VENTOLIN HFA) 108 (90 Base) MCG/ACT inhaler Inhale 2 puffs into the lungs every 6 (six) hours as needed.   apixaban (ELIQUIS) 5 MG TABS tablet Take 1 tablet (5 mg total) by mouth 2 (two) times daily.    atorvastatin (LIPITOR) 40 MG tablet Take 1 tablet (40 mg total) by mouth daily.   azelastine (ASTELIN) 0.1 % nasal spray Place 1 spray into both nostrils 2 (two) times daily. Use in each nostril as directed   bisoprolol (ZEBETA) 10 MG tablet TAKE 1 TABLET(10 MG) BY MOUTH TWICE DAILY   busPIRone (BUSPAR) 5 MG tablet Take 1 tablet (5 mg total) by mouth 2 (two) times daily.   calcitRIOL (ROCALTROL) 0.25 MCG capsule Take 0.25 mcg by mouth daily.   cetirizine (ZYRTEC) 10 MG tablet Take 10 mg by mouth daily.   diclofenac Sodium (VOLTAREN ARTHRITIS PAIN) 1 % GEL Apply 2 g topically 4 (four) times daily as needed.   DULoxetine (CYMBALTA) 60 MG capsule Take 1 capsule (60 mg total) by mouth daily.   empagliflozin (JARDIANCE) 25 MG TABS tablet Take 1 tablet (25 mg total) by mouth daily before breakfast.   ferrous sulfate 325 (65 FE) MG tablet Take 1 tablet (325 mg total) by mouth 2 (two) times daily with a meal.   fluticasone (FLONASE) 50 MCG/ACT nasal spray Place 2 sprays into both nostrils daily.   furosemide (LASIX) 80 MG tablet TAKE ONE TABLET BY MOUTH TWICE DAILY FOR FLUID   montelukast (SINGULAIR) 10 MG tablet Take 1 tablet (10 mg total) by mouth at bedtime.   mupirocin ointment (BACTROBAN) 2 % Apply 1 Application topically 2 (two) times daily.   olmesartan (BENICAR) 40  MG tablet Take 1 tablet (40 mg total) by mouth daily.   omeprazole (PRILOSEC) 20 MG capsule Take 20 mg by mouth daily as needed (heartburn).    Semaglutide,0.25 or 0.5MG /DOS, (OZEMPIC, 0.25 OR 0.5 MG/DOSE,) 2 MG/1.5ML SOPN Inject 0.5 mg into the skin once a week.   spironolactone (ALDACTONE) 25 MG tablet Take 1 tablet (25 mg total) by mouth daily.   tiZANidine (ZANAFLEX) 2 MG tablet Take 1 tablet (2 mg total) by mouth every 6 (six) hours as needed for muscle spasms.   lidocaine (LIDODERM) 5 % Place 1 patch onto the skin daily. Remove & Discard patch within 12 hours or as directed by MD (Patient not taking: Reported on 02/01/2023)    minoxidil (LONITEN) 10 MG tablet TAKE ONE TABLET BY MOUTH ONCE DAILY FOR BLOOD PRESSURE (Patient not taking: Reported on 02/01/2023)   polyethylene glycol powder (GLYCOLAX/MIRALAX) 17 GM/SCOOP powder MIX 17 GRAMS(1 CAPFUL) OF POWDER IN 4 TO 8 OUNCES OF FLUID AND TAKE BY MOUTH ONCE DAILY AS NEEDED FOR CONSTIPATION (Patient not taking: Reported on 02/01/2023)   No facility-administered encounter medications on file as of 02/01/2023.    Allergies (verified) Bupropion, Gabapentin, Pioglitazone, Vicodin [hydrocodone-acetaminophen], and Vicoprofen [hydrocodone-ibuprofen]   History: Past Medical History:  Diagnosis Date   Chronic pain syndrome    Depression    Diabetes mellitus without complication (HCC)    GERD (gastroesophageal reflux disease)    History of hiatal hernia    Hypertension    Iron deficiency anemia    Microalbuminuric diabetic nephropathy (HCC)    Mitral regurgitation    Obesity    Shortness of breath dyspnea    Sleep apnea    Past Surgical History:  Procedure Laterality Date   ABDOMINAL HYSTERECTOMY     BACK SURGERY     BREAST BIOPSY Right 03/2003   CARDIAC CATHETERIZATION N/A 10/10/2014   Procedure: Left Heart Cath;  Surgeon: Marcina Millard, MD;  Location: [facility] INVASIVE CV LAB;  Service: Cardiovascular;  Laterality: N/A;   CARDIAC CATHETERIZATION     carpal tunnell release  10/23/11   COLONOSCOPY WITH PROPOFOL N/A 02/20/2015   Procedure: COLONOSCOPY WITH PROPOFOL;  Surgeon: Wallace Cullens, MD;  Location: [facility] ENDOSCOPY;  Service: Gastroenterology;  Laterality: N/A;   COLONOSCOPY WITH PROPOFOL N/A 04/26/2017   Procedure: COLONOSCOPY WITH PROPOFOL;  Surgeon: Toledo, Boykin Nearing, MD;  Location: [facility] ENDOSCOPY;  Service: Gastroenterology;  Laterality: N/A;   ESOPHAGOGASTRODUODENOSCOPY (EGD) WITH PROPOFOL N/A 02/20/2015   Procedure: ESOPHAGOGASTRODUODENOSCOPY (EGD) WITH PROPOFOL;  Surgeon: Wallace Cullens, MD;  Location: [facility] ENDOSCOPY;  Service: Gastroenterology;  Laterality: N/A;    ESOPHAGOGASTRODUODENOSCOPY (EGD) WITH PROPOFOL N/A 04/26/2017   Procedure: ESOPHAGOGASTRODUODENOSCOPY (EGD) WITH PROPOFOL;  Surgeon: Toledo, Boykin Nearing, MD;  Location: [facility] ENDOSCOPY;  Service: Gastroenterology;  Laterality: N/A;   HERNIA REPAIR     LAPAROSCOPIC OVARIAN CYSTECTOMY  08/2001   nissen fundoplycation  11/99   NOSE SURGERY     Family History  Problem Relation Age of Onset   Heart attack Mother    Hypertension Father    Stroke Father    Heart attack Brother    Social History   Socioeconomic History   Marital status: Married    Spouse name: Perlie Gold   Number of children: 5   Years of education: Not on file   Highest education level: Not on file  Occupational History   Occupation: disabled  Tobacco Use   Smoking status: Never   Smokeless tobacco: Current    Types: Snuff  Vaping Use   Vaping status: Never Used  Substance and Sexual Activity   Alcohol use: No   Drug use: No   Sexual activity: Not Currently  Other Topics Concern   Not on file  Social History Narrative   Married, 5 children 4 biological daughters and 1 adopted son (nephew)   Social Determinants of Health   Financial Resource Strain: Low Risk  (02/01/2023)   Overall Financial Resource Strain (CARDIA)    Difficulty of Paying Living Expenses: Not very hard  Food Insecurity: No Food Insecurity (02/01/2023)   Hunger Vital Sign    Worried About Running Out of Food in the Last Year: Never true    Ran Out of Food in the Last Year: Never true  Transportation Needs: No Transportation Needs (02/01/2023)   PRAPARE - Administrator, Civil Service (Medical): No    Lack of Transportation (Non-Medical): No  Physical Activity: Insufficiently Active (02/01/2023)   Exercise Vital Sign    Days of Exercise per Week: 7 days    Minutes of Exercise per Session: 10 min  Stress: Stress Concern Present (02/01/2023)   [employer] of Occupational Health - Occupational Stress Questionnaire    Feeling of Stress :  To some extent  Social Connections: Moderately Integrated (02/01/2023)   Social Connection and Isolation Panel [NHANES]    Frequency of Communication with Friends and Family: More than three times a week    Frequency of Social Gatherings with Friends and Family: More than three times a week    Attends Religious Services: More than 4 times per year    Active Member of [employer] or Organizations: No    Attends Engineer, structural: Never    Marital Status: Married    Tobacco Counseling Ready to quit: Not Answered Counseling given: Not Answered   Clinical Intake:  Pre-visit preparation completed: Yes  Pain : No/denies pain     BMI - recorded: 38.73 Nutritional Status: BMI > 30  Obese Nutritional Risks: None Diabetes: Yes CBG done?: No (FBS 116 per patient) Did pt. bring in CBG monitor from home?: No  How often do you need to have someone help you when you read instructions, pamphlets, or other written materials from your doctor or pharmacy?: 1 - Never  Interpreter Needed?: No  Information entered by :: Tora Kindred, CMA   Activities of Daily Living    02/01/2023    3:00 PM  In your present state of health, do you have any difficulty performing the following activities:  Hearing? 0  Vision? 0  Difficulty concentrating or making decisions? 0  Walking or climbing stairs? 1  Comment uses cane, walker or wheelchair when needed  Dressing or bathing? 0  Doing errands, shopping? 0  Preparing Food and eating ? N  Using the Toilet? N  In the past six months, have you accidently leaked urine? N  Do you have problems with loss of bowel control? N  Managing your Medications? N  Managing your Finances? N  Housekeeping or managing your Housekeeping? N    Patient Care Team: Marjie Skiff, NP as PCP - General (Nurse Practitioner) Marlowe Sax, RN as Registered Nurse (General Practice)  Indicate any recent Medical Services you may have received from other than [facility]  providers in the past year (date may be approximate).     Assessment:   This is a routine wellness examination for Bettey.  Hearing/Vision screen Hearing Screening - Comments:: Denies hearing loss Vision Screening -  Comments:: Gets routine eye exams  Dietary issues and exercise activities discussed:     Goals Addressed               This Visit's Progress     Patient Stated (pt-stated)        Lose weight, 150lbs      Depression Screen    02/01/2023    3:17 PM 01/05/2023    9:49 AM 09/29/2022   10:00 AM 06/25/2022   10:05 AM 03/23/2022   10:06 AM 01/20/2022   11:43 AM 12/21/2021   10:21 AM  PHQ 2/9 Scores  PHQ - 2 Score 1 0 1 2 2 1 3   PHQ- 9 Score 1 3 3 5 6 5 7     Fall Risk    02/01/2023    3:23 PM 01/19/2023   11:55 AM 01/05/2023    9:48 AM 09/29/2022   10:00 AM 07/13/2022   11:26 AM  Fall Risk   Falls in the past year? 0 0 0 0 0  Number falls in past yr: 0 0 0 0 0  Injury with Fall? 0 0 0 0 0  Risk for fall due to : Impaired mobility No Fall Risks No Fall Risks No Fall Risks Impaired balance/gait;Impaired mobility  Follow up Falls prevention discussed;Falls evaluation completed Falls evaluation completed;Education provided;Falls prevention discussed Falls evaluation completed Falls evaluation completed Falls evaluation completed;Education provided;Falls prevention discussed    MEDICARE RISK AT HOME: Medicare Risk at Home Any stairs in or around the home?: Yes If so, are there any without handrails?: Yes Home free of loose throw rugs in walkways, pet beds, electrical cords, etc?: Yes Adequate lighting in your home to reduce risk of falls?: Yes Life alert?: No Use of a cane, walker or w/c?: Yes Grab bars in the bathroom?: Yes Shower chair or bench in shower?: Yes Elevated toilet seat or a handicapped toilet?: No  TIMED UP AND GO:  Was the test performed?  No    Cognitive Function:        02/01/2023    3:25 PM 01/20/2022   11:40 AM 01/19/2021    2:11 PM  6CIT  Screen  What Year? 0 points 0 points 0 points  What month? 0 points 0 points 0 points  What time? 0 points 0 points 0 points  Count back from 20 0 points 0 points 0 points  Months in reverse 0 points 0 points 2 points  Repeat phrase 0 points 0 points 0 points  Total Score 0 points 0 points 2 points    Immunizations Immunization History  Administered Date(s) Administered   Moderna Sars-Covid-2 Vaccination 11/18/2020, 12/16/2020   PNEUMOCOCCAL CONJUGATE-20 09/29/2022   Pneumococcal Conjugate-13 03/23/2021   Td 12/21/2021    TDAP status: Up to date  Flu Vaccine status: Declined, Education has been provided regarding the importance of this vaccine but patient still declined. Advised may receive this vaccine at local pharmacy or Health Dept. Aware to provide a copy of the vaccination record if obtained from local pharmacy or Health Dept. Verbalized acceptance and understanding.  Pneumococcal vaccine status: Up to date  Covid-19 vaccine status: Declined, Education has been provided regarding the importance of this vaccine but patient still declined. Advised may receive this vaccine at local pharmacy or Health Dept.or vaccine clinic. Aware to provide a copy of the vaccination record if obtained from local pharmacy or Health Dept. Verbalized acceptance and understanding.  Qualifies for Shingles Vaccine? Yes   Zostavax completed No  Shingrix Completed?: No.    Education has been provided regarding the importance of this vaccine. Patient has been advised to call insurance company to determine out of pocket expense if they have not yet received this vaccine. Advised may also receive vaccine at local pharmacy or Health Dept. Verbalized acceptance and understanding.  Screening Tests Health Maintenance  Topic Date Due   OPHTHALMOLOGY EXAM  Never done   Zoster Vaccines- Shingrix (1 of 2) Never done   COVID-19 Vaccine (3 - Moderna risk series) 01/13/2021   FOOT EXAM  12/22/2022   INFLUENZA  VACCINE  Never done   Diabetic kidney evaluation - eGFR measurement  04/06/2023   Diabetic kidney evaluation - Urine ACR  06/26/2023   HEMOGLOBIN A1C  07/08/2023   Medicare Annual Wellness (AWV)  02/01/2024   MAMMOGRAM  03/03/2024   Colonoscopy  04/27/2027   DEXA SCAN  04/28/2031   DTaP/Tdap/Td (2 - Tdap) 12/22/2031   Pneumonia Vaccine 47+ Years old  Completed   Hepatitis C Screening  Completed   HPV VACCINES  Aged Out    Health Maintenance  Health Maintenance Due  Topic Date Due   OPHTHALMOLOGY EXAM  Never done   Zoster Vaccines- Shingrix (1 of 2) Never done   COVID-19 Vaccine (3 - Moderna risk series) 01/13/2021   FOOT EXAM  12/22/2022   INFLUENZA VACCINE  Never done    Colorectal cancer screening: No longer required.   Mammogram status: Completed 03/03/22. Repeat every year  Bone Density status: Completed 04/17/21. Results reflect: Bone density results: NORMAL. Repeat every 10 years.  Lung Cancer Screening: (Low Dose CT Chest recommended if Age 73-80 years, 20 pack-year currently smoking OR have quit w/in 15years.) does not qualify.   Lung Cancer Screening Referral: n/a  Additional Screening:  Hepatitis C Screening: does not qualify; Completed 03/23/21  Vision Screening: Recommended annual ophthalmology exams for early detection of glaucoma and other disorders of the eye. Is the patient up to date with their annual eye exam?  Yes  Who is the provider or what is the name of the office in which the patient attends annual eye exams? [facility], [place] If pt is not established with a provider, would they like to be referred to a provider to establish care? No .   Dental Screening: Recommended annual dental exams for proper oral hygiene  Diabetic Foot Exam: Diabetic Foot Exam: Overdue, Pt has been advised about the importance in completing this exam. Pt is scheduled for diabetic foot exam on 04/13/23.  Community Resource Referral / Chronic Care Management: CRR  required this visit?  No   CCM required this visit?  No     Plan:     I have personally reviewed and noted the following in the patient's chart:   Medical and social history Use of alcohol, tobacco or illicit drugs  Current medications and supplements including opioid prescriptions. Patient is not currently taking opioid prescriptions. Functional ability and status Nutritional status Physical activity Advanced directives List of other physicians Hospitalizations, surgeries, and ER visits in previous 12 months Vitals Screenings to include cognitive, depression, and falls Referrals and appointments  In addition, I have reviewed and discussed with patient certain preventive protocols, quality metrics, and best practice recommendations. A written personalized care plan for preventive services as well as general preventive health recommendations were provided to patient.     Tora Kindred, CMA   02/01/2023   After Visit Summary: (Mail) Due to this being a telephonic visit, the  after visit summary with patients personalized plan was offered to patient via mail   Nurse Notes:  Requested most recent eye exam from [facility], [place] Needs DM foot exam at next OV on 04/13/23. Declines flu and covid vaccines.

## 2023-02-07 ENCOUNTER — Other Ambulatory Visit: Payer: Self-pay | Admitting: Nurse Practitioner

## 2023-02-07 NOTE — Telephone Encounter (Signed)
Medication Refill - Medication: empagliflozin (JARDIANCE) 25 MG TABS tablet   Has the patient contacted their pharmacy? No.  Preferred Pharmacy (with phone number or street name):  [facility] - [place], [place] - [address], [place] [address] Phone: [phone]  Fax: [phone]     Has the patient been seen for an appointment in the last year OR does the patient have an upcoming appointment? Yes.    Agent: Please be advised that RX refills may take up to 3 business days. We ask that you follow-up with your pharmacy.

## 2023-02-08 MED ORDER — EMPAGLIFLOZIN 25 MG PO TABS: 25.0000 mg | ORAL_TABLET | Freq: Every day | ORAL | 1 refills | Status: AC

## 2023-02-08 NOTE — Telephone Encounter (Signed)
Requested Prescriptions  Pending Prescriptions Disp Refills   empagliflozin (JARDIANCE) 25 MG TABS tablet 90 tablet 1    Sig: Take 1 tablet (25 mg total) by mouth daily before breakfast.     Endocrinology:  Diabetes - SGLT2 Inhibitors Failed - 02/07/2023 11:52 AM      Failed - Cr in normal range and within 360 days    Creatinine  Date Value Ref Range Status  01/24/2014 0.86 0.60 - 1.30 mg/dL Final   Creatinine, Ser  Date Value Ref Range Status  04/05/2022 1.66 (H) 0.44 - 1.00 mg/dL Final         Failed - eGFR in normal range and within 360 days    EGFR (African American)  Date Value Ref Range Status  01/24/2014 >60  Final   GFR calc Af Amer  Date Value Ref Range Status  03/19/2017 >60 >60 mL/min Final    Comment:    (NOTE) The eGFR has been calculated using the CKD EPI equation. This calculation has not been validated in all clinical situations. eGFR's persistently <60 mL/min signify possible Chronic Kidney Disease.    EGFR (Non-African Amer.)  Date Value Ref Range Status  01/24/2014 >60  Final    Comment:    eGFR values <51mL/min/1.73 m2 may be an indication of chronic kidney disease (CKD). Calculated eGFR is useful in patients with stable renal function. The eGFR calculation will not be reliable in acutely ill patients when serum creatinine is changing rapidly. It is not useful in  patients on dialysis. The eGFR calculation may not be applicable to patients at the low and high extremes of body sizes, pregnant women, and vegetarians.    GFR, Estimated  Date Value Ref Range Status  04/05/2022 33 (L) >60 mL/min Final    Comment:    (NOTE) Calculated using the CKD-EPI Creatinine Equation (2021)    eGFR  Date Value Ref Range Status  03/23/2022 35 (L) >59 mL/min/1.73 Final         Passed - HBA1C is between 0 and 7.9 and within 180 days    HB A1C (BAYER DCA - WAIVED)  Date Value Ref Range Status  01/05/2023 5.9 (H) 4.8 - 5.6 % Final    Comment:              Prediabetes: 5.7 - 6.4          Diabetes: >6.4          Glycemic control for adults with diabetes: <7.0          Passed - Valid encounter within last 6 months    Recent Outpatient Visits           1 month ago Type 2 diabetes mellitus with morbid obesity (HCC)   [place] [facility] [place], [place] T, NP   4 months ago Type 2 diabetes mellitus with morbid obesity (HCC)   [place] [facility] [place], [place] T, NP   7 months ago Type 2 diabetes mellitus with morbid obesity (HCC)   [place] [facility] [place], [place] T, NP   10 months ago Type 2 diabetes mellitus with morbid obesity (HCC)   [place] [facility] [place], [place] T, NP   1 year ago Type 2 diabetes mellitus with morbid obesity (HCC)   [place] [facility] [place], Dorie Rank, NP       Future Appointments             In 2  months Marjie Skiff, NP [place] [facility]

## 2023-02-11 ENCOUNTER — Telehealth: Payer: Self-pay

## 2023-02-11 NOTE — Telephone Encounter (Signed)
5 boxes of Ozempic received for the patient. Called and notified her that it was ready to be picked up.

## 2023-02-11 NOTE — Telephone Encounter (Addendum)
Patient called and stated her husband, Cindy [name], will be coming to pick up her Ozempic, today, 02/11/2023.

## 2023-02-14 NOTE — Telephone Encounter (Signed)
Medication picked up by the patient's husband on 02/11/23.

## 2023-02-21 ENCOUNTER — Telehealth: Payer: Self-pay

## 2023-02-21 ENCOUNTER — Ambulatory Visit: Payer: Self-pay | Admitting: *Deleted

## 2023-02-21 DIAGNOSIS — E1169 Type 2 diabetes mellitus with other specified complication: Secondary | ICD-10-CM

## 2023-02-21 NOTE — Telephone Encounter (Signed)
  Chief Complaint: requesting assist with medications. Elevated BP with sx. [facility] no longer covering medications.  Symptoms: BP 156/81 and BP 159/79 HR 61 now. C/o headache, dizziness and blurred vision at times. Reports "feels funny". Has not been taking jardiance x 2 weeks due to [facility] stop sending medication and reports they no longer cover her care program . Reports she also has not been taking dilatezam and would like to know if she needs to restart.  Both side / fingers each hands numbness, not new.  Blood sugar 130's Frequency: today  Pertinent Negatives: Patient denies chest pain now no difficulty breathing no fever. No weakness on either side of body no decrease in balance  Disposition: [] ED /[] Urgent Care (no appt availability in office) / [x] Appointment(In office/virtual)/ []  [facility]/ [] Home Care/ [] Refused Recommended Disposition /[] [facility]/ []  Follow-up with PCP Additional Notes:   No appt with PCP available . Scheduled appt with another provider tomorrow. Recommended if sx return or worsen go to ED. Patient reports she will increase water intake. Please adivse what to do regarding jardiance and if needs to be sent to another pharmacy. [facility] pharmacy reported to patient they no longer participate in B I care program and did not notify patient until pharmacy was called today. Please advise.        Reason for Disposition  Systolic BP  >= 180 OR Diastolic >= 110    BP 150's / 25'D with headache, blurred vision / some dizziness at times.  Answer Assessment - Initial Assessment Questions 1. BLOOD PRESSURE: "What is the blood pressure?" "Did you take at least two measurements 5 minutes apart?"     BP 156/81, BP 159/79 P 61 2. ONSET: "When did you take your blood pressure?"     Now  3. HOW: "How did you take your blood pressure?" (e.g., automatic home BP monitor, visiting nurse)     Automatic BP  4. HISTORY: "Do you have a history of high  blood pressure?"     Yes  5. MEDICINES: "Are you taking any medicines for blood pressure?" "Have you missed any doses recently?"     No has changed and no longer taking dilatezam and has not been taking jardiance due to [facility] is no longer covering medications and patient is not on "program" 6. OTHER SYMPTOMS: "Do you have any symptoms?" (e.g., blurred vision, chest pain, difficulty breathing, headache, weakness)     Headaches , dizzy at times. Feels "funny" at times.   7. PREGNANCY: "Is there any chance you are pregnant?" "When was your last menstrual period?"     na  Protocols used: Blood Pressure - High-A-AH

## 2023-02-21 NOTE — Progress Notes (Signed)
Care Guide Note  02/21/2023 Name: Cindy [name] MRN: [id number] DOB: 03-06-1951  Referred by: Marjie Skiff, NP Reason for referral : Care Coordination (Outreach to schedule with Pharm d )   Cindy [name] is a 72 y.o. year old female who is a primary care patient of Cannady, Dorie Rank, NP. Cindy [name] was referred to the pharmacist for assistance related to HTN and DM.    Successful contact was made with the patient to discuss pharmacy services including being ready for the pharmacist to call at least 5 minutes before the scheduled appointment time, to have medication bottles and any blood sugar or blood pressure readings ready for review. The patient agreed to meet with the pharmacist via with the pharmacist via telephone visit on (date/time).  03/01/2023  Penne Lash, RMA Care Guide [facility]  [place], [place] [address] Direct Dial: [phone] [email or web address]

## 2023-02-21 NOTE — Telephone Encounter (Signed)
Patient scheduled to see Dr. Evelene Croon tomorrow. Will route to her and PCP.

## 2023-02-22 ENCOUNTER — Other Ambulatory Visit: Payer: Self-pay

## 2023-02-22 ENCOUNTER — Ambulatory Visit (INDEPENDENT_AMBULATORY_CARE_PROVIDER_SITE_OTHER): Payer: Medicare HMO | Admitting: Pediatrics

## 2023-02-22 ENCOUNTER — Encounter: Payer: Self-pay | Admitting: Pediatrics

## 2023-02-22 VITALS — BP 187/47 | HR 61 | Temp 98.2°F | Wt 212.4 lb

## 2023-02-22 DIAGNOSIS — I152 Hypertension secondary to endocrine disorders: Secondary | ICD-10-CM | POA: Diagnosis not present

## 2023-02-22 DIAGNOSIS — R0602 Shortness of breath: Secondary | ICD-10-CM | POA: Diagnosis not present

## 2023-02-22 DIAGNOSIS — Z7984 Long term (current) use of oral hypoglycemic drugs: Secondary | ICD-10-CM | POA: Diagnosis not present

## 2023-02-22 DIAGNOSIS — E1159 Type 2 diabetes mellitus with other circulatory complications: Secondary | ICD-10-CM

## 2023-02-22 LAB — VERITOR FLU A/B WAIVED
Influenza A: NEGATIVE
Influenza B: NEGATIVE

## 2023-02-22 MED ORDER — SPIRONOLACTONE 25 MG PO TABS
50.0000 mg | ORAL_TABLET | Freq: Every day | ORAL | 0 refills | Status: DC
Start: 2023-02-22 — End: 2023-03-29

## 2023-02-22 NOTE — Telephone Encounter (Signed)
Called and informed patient. 

## 2023-02-22 NOTE — Progress Notes (Signed)
02/22/2023  Patient ID: [name], female   DOB: 03-30-1951, 71 y.o.   MRN: [id number]  Clinic routed request stating patient is currently out of Jardiance 25mg  daily due to no longer being able to receive from [facility].  Upon reaching out to [facility] PAP, [facility] is no longer processing their prescriptions; but these have been transferred to [facility].  Ms. Stear has active enrollment through [facility] and a refill on profile.  [facility] pharmacist put refill into process and states delivery is set up for overnight air.  Patient should receive by the end of the week.  Telephone visit has already been scheduled for 10/1 to complete medication review and address any additional medication management needs.  [name], PharmD, DPLA

## 2023-02-22 NOTE — Progress Notes (Signed)
Acute Visit  BP (!) 187/47   Pulse 61   Temp 98.2 F (36.8 C) (Oral)   Wt 212 lb 6.4 oz (96.3 kg)   SpO2 96%   BMI 40.13 kg/m    Subjective:    Patient ID: Cindy [name], female    DOB: [birth year], 72 y.o.   MRN: [id number]  HPI: Cindy [name] is a 72 y.o. female  Chief Complaint  Patient presents with   Diabetes    Patient she hasn't had medications for 2 weeks    Hypertension   Patient reports she has not felt well for several weeks She has run out of jardiance, ?insurance issues She notes some shortness of breath recently, checking BP at home and has been elevated Some dizziness, headaches, no chest pain,  palpations, orthopnea, dyspnea, PND, lower extremity edema. Denies vision changes, increased urinary frequency, urgency, neuropathy, increased thirst.  Relevant past medical, surgical, family and social history reviewed and updated as indicated. Interim medical history since our last visit reviewed. Allergies and medications reviewed and updated.  ROS per HPI unless specifically indicated above     Objective:    BP (!) 187/47   Pulse 61   Temp 98.2 F (36.8 C) (Oral)   Wt 212 lb 6.4 oz (96.3 kg)   SpO2 96%   BMI 40.13 kg/m   Wt Readings from Last 3 Encounters:  02/22/23 212 lb 6.4 oz (96.3 kg)  02/01/23 205 lb (93 kg)  01/05/23 205 lb 12.8 oz (93.4 kg)     Physical Exam Constitutional:      Appearance: Normal appearance.  HENT:     Head: Normocephalic and atraumatic.  Eyes:     Pupils: Pupils are equal, round, and reactive to light.  Cardiovascular:     Rate and Rhythm: Normal rate and regular rhythm.     Pulses: Normal pulses.     Heart sounds: Murmur heard.     Comments: Grade 2 systolic murmur Pulmonary:     Effort: Pulmonary effort is normal.     Breath sounds: Normal breath sounds.  Abdominal:     General: Abdomen is flat.     Palpations: Abdomen is soft.  Musculoskeletal:        General: Normal range of motion.     Cervical  back: Normal range of motion.  Skin:    General: Skin is warm and dry.     Capillary Refill: Capillary refill takes less than 2 seconds.  Neurological:     General: No focal deficit present.     Mental Status: She is alert. Mental status is at baseline.  Psychiatric:        Mood and Affect: Mood normal.        Behavior: Behavior normal.        Assessment & Plan:  Assessment & Plan   Hypertension associated with diabetes (HCC) Assessment & Plan: Pt presenting w poorly controlled HTN. Previously well controlled on regimen of olmesartan 40mg , spironolactone 25mg , zebeta 10mg , lasix 80mg . Exam reassuring. Suspect poorly controlled from running out of jardiance. Pharmacy team able to fix insurance issues, patient should receive jardiance again by the end of the week. Will double up on spironolactone to 50mg  in the meantime (pt preferred vs adding another agent in the short term). Pt given strict return precautions and anticipatory guidance for ED. Will check in again later this week.   Orders: -     Renal function panel -  Spironolactone; Take 2 tablets (50 mg total) by mouth daily.  Dispense: 60 tablet; Refill: 0 -     Brain natriuretic peptide  SOB (shortness of breath) Clear long exam. No s/s c/f heart failure exacerbation. Suspect sob sensation related to above. Plan to r/o respiratory viruses as below.  -     Novel Coronavirus, NAA ([facility]); Future -     Veritor Flu A/B Waived; Future  Follow up plan: Return in about 2 days (around 02/24/2023) for HTN.  Cuca Benassi Howell Pringle, MD

## 2023-02-22 NOTE — Assessment & Plan Note (Signed)
Pt presenting w poorly controlled HTN. Previously well controlled on regimen of olmesartan 40mg , spironolactone 25mg , zebeta 10mg , lasix 80mg . Exam reassuring. Suspect poorly controlled from running out of jardiance. Pharmacy team able to fix insurance issues, patient should receive jardiance again by the end of the week. Will double up on spironolactone to 50mg  in the meantime (pt preferred vs adding another agent in the short term). Pt given strict return precautions and anticipatory guidance for ED. Will check in again later this week.

## 2023-02-22 NOTE — Patient Instructions (Addendum)
Let's plan to double up on the spironolactone 25mg  until you start the jardiance again.  I will message you an update today.  Please seek medical attention in the Emergency Department immediately if you develop persistant or increased chest pain, shortness of breath, fainting spells, sudden sweatiness, pain radiating to your back, sever cough, leg swelling, fevers greater than 100.73F that do not resolve with Tylenol, or any other symptoms that concern you.

## 2023-02-23 LAB — RENAL FUNCTION PANEL
Albumin: 4.4 g/dL (ref 3.8–4.8)
BUN/Creatinine Ratio: 6 — ABNORMAL LOW (ref 12–28)
BUN: 7 mg/dL — ABNORMAL LOW (ref 8–27)
CO2: 27 mmol/L (ref 20–29)
Calcium: 9.3 mg/dL (ref 8.7–10.3)
Chloride: 104 mmol/L (ref 96–106)
Creatinine, Ser: 1.13 mg/dL — ABNORMAL HIGH (ref 0.57–1.00)
Glucose: 101 mg/dL — ABNORMAL HIGH (ref 70–99)
Phosphorus: 3.3 mg/dL (ref 3.0–4.3)
Potassium: 3.5 mmol/L (ref 3.5–5.2)
Sodium: 144 mmol/L (ref 134–144)
eGFR: 51 mL/min/{1.73_m2} — ABNORMAL LOW (ref >59)

## 2023-02-23 LAB — BRAIN NATRIURETIC PEPTIDE: BNP: 95.4 pg/mL (ref 0.0–100.0)

## 2023-02-24 ENCOUNTER — Encounter: Payer: Self-pay | Admitting: Pediatrics

## 2023-02-24 ENCOUNTER — Ambulatory Visit: Payer: Medicare HMO | Admitting: Pediatrics

## 2023-02-24 VITALS — BP 160/74 | HR 63 | Temp 98.8°F | Ht 60.0 in | Wt 211.8 lb

## 2023-02-24 DIAGNOSIS — R9431 Abnormal electrocardiogram [ECG] [EKG]: Secondary | ICD-10-CM | POA: Diagnosis not present

## 2023-02-24 DIAGNOSIS — I110 Hypertensive heart disease with heart failure: Secondary | ICD-10-CM | POA: Diagnosis not present

## 2023-02-24 DIAGNOSIS — E785 Hyperlipidemia, unspecified: Secondary | ICD-10-CM | POA: Diagnosis not present

## 2023-02-24 DIAGNOSIS — R0609 Other forms of dyspnea: Secondary | ICD-10-CM | POA: Diagnosis not present

## 2023-02-24 DIAGNOSIS — I119 Hypertensive heart disease without heart failure: Secondary | ICD-10-CM | POA: Diagnosis not present

## 2023-02-24 DIAGNOSIS — I480 Paroxysmal atrial fibrillation: Secondary | ICD-10-CM | POA: Diagnosis not present

## 2023-02-24 DIAGNOSIS — I3139 Other pericardial effusion (noninflammatory): Secondary | ICD-10-CM | POA: Diagnosis not present

## 2023-02-24 DIAGNOSIS — R0602 Shortness of breath: Secondary | ICD-10-CM | POA: Diagnosis not present

## 2023-02-24 DIAGNOSIS — I498 Other specified cardiac arrhythmias: Secondary | ICD-10-CM | POA: Diagnosis not present

## 2023-02-24 DIAGNOSIS — I509 Heart failure, unspecified: Secondary | ICD-10-CM | POA: Diagnosis not present

## 2023-02-24 DIAGNOSIS — R079 Chest pain, unspecified: Secondary | ICD-10-CM | POA: Diagnosis not present

## 2023-02-24 DIAGNOSIS — E119 Type 2 diabetes mellitus without complications: Secondary | ICD-10-CM | POA: Diagnosis not present

## 2023-02-24 DIAGNOSIS — R002 Palpitations: Secondary | ICD-10-CM | POA: Diagnosis not present

## 2023-02-24 DIAGNOSIS — I517 Cardiomegaly: Secondary | ICD-10-CM | POA: Diagnosis not present

## 2023-02-24 DIAGNOSIS — Z7901 Long term (current) use of anticoagulants: Secondary | ICD-10-CM | POA: Diagnosis not present

## 2023-02-24 DIAGNOSIS — J811 Chronic pulmonary edema: Secondary | ICD-10-CM | POA: Diagnosis not present

## 2023-02-24 DIAGNOSIS — K219 Gastro-esophageal reflux disease without esophagitis: Secondary | ICD-10-CM | POA: Diagnosis not present

## 2023-02-24 LAB — NOVEL CORONAVIRUS, NAA

## 2023-02-24 MED ORDER — ALBUTEROL SULFATE (2.5 MG/3ML) 0.083% IN NEBU
2.5000 mg | INHALATION_SOLUTION | Freq: Once | RESPIRATORY_TRACT | Status: AC
Start: 2023-02-24 — End: 2023-02-24
  Administered 2023-02-24: 2.5 mg via RESPIRATORY_TRACT

## 2023-02-24 NOTE — Patient Instructions (Signed)
I am concerned about new damage to your heart based on the EKG we did in clinic.

## 2023-02-24 NOTE — Progress Notes (Signed)
Follow Up Visit  BP (!) 160/74   Pulse 63   Temp 98.8 F (37.1 C) (Oral)   Ht 5' (1.524 m)   Wt 211 lb 12.8 oz (96.1 kg)   SpO2 97%   BMI 41.36 kg/m    Subjective:    Patient ID: [name], female    DOB: 1951/04/07, 72 y.o.   MRN: [id number]  HPI: Cindy [name] is a 72 y.o. female  Chief Complaint  Patient presents with   Hypertension    Pt states at home it has still been running high, average systolic 160s   HTN follow up Doubled up on spironolactone blood pressures are better; jardiance has not yet arrived She remains most concerned about ongoing SOB She reports this was present even before running out of jardiance Her SOB is worse when moving Started 1 month ago, before running out of jardiance and having elevated blood pressures Feels like chest pressure, feels better when she rests Scheduled to see cardiologist in December Albuterol inhaler hasn't helped Having some heart flutters as well, underlying afib No lower extremity edema, no orthopnea, no PND She has OSA, uses CPAP every night Has inhaler at home, has not helped SOB symptoms  Relevant past medical, surgical, family and social history reviewed and updated as indicated. Interim medical history since our last visit reviewed. Allergies and medications reviewed and updated.  ROS per HPI unless specifically indicated above     Objective:    BP (!) 160/74   Pulse 63   Temp 98.8 F (37.1 C) (Oral)   Ht 5' (1.524 m)   Wt 211 lb 12.8 oz (96.1 kg)   SpO2 97%   BMI 41.36 kg/m   Wt Readings from Last 3 Encounters:  02/24/23 211 lb 12.8 oz (96.1 kg)  02/22/23 212 lb 6.4 oz (96.3 kg)  02/01/23 205 lb (93 kg)     Physical Exam Constitutional:      Appearance: Normal appearance.  HENT:     Head: Normocephalic and atraumatic.  Eyes:     Pupils: Pupils are equal, round, and reactive to light.  Cardiovascular:     Rate and Rhythm: Normal rate and regular rhythm.     Pulses: Normal pulses.      Heart sounds: Murmur heard.     Comments: Systolic murmur (known) Pulmonary:     Effort: Pulmonary effort is normal.     Breath sounds: Normal breath sounds.  Abdominal:     General: Abdomen is flat.     Palpations: Abdomen is soft.  Musculoskeletal:        General: Normal range of motion.     Cervical back: Normal range of motion.  Skin:    General: Skin is warm and dry.     Capillary Refill: Capillary refill takes less than 2 seconds.  Neurological:     General: No focal deficit present.     Mental Status: She is alert. Mental status is at baseline.  Psychiatric:        Mood and Affect: Mood normal.        Behavior: Behavior normal.     EKG: On my review: compared to 2022 EKG in chart Subtle LBBB noted on V1 and V6 ST depressions compared to prior in V5 and V6 T wave inversions in V4-V6, t wave abnormalities in other wise the same from 2022 though difficult to compare scanned image     Assessment & Plan:  Assessment & Plan  Dyspnea on exertion Pt with ~1 month long. Earlier this week, suspected sx may have been due to poorly controlled BP but given persistence of sx and duration may have been before BP issues, warranted further evaluation today. Spirometry in house overall reassuring w/o underlying obstructive pattern. Reviewed EKG in clinic and am concerned about new ischemic changes. Suspect symptoms more cardiac in nature at this point. We discussed risk/benefit of waiting for outpatient cardiology assessment vs going to the ER. I voiced concern over delays in outpatient workup and changes already seen on EKG. Pt in agreement to present to ER today. She declines ambulance transportation. She will call family to take her to ER.  -     Spirometry with Graph -     EKG 12-Lead  Follow up plan: Return if symptoms worsen or fail to improve.  Aadyn Buchheit Howell Pringle, MD

## 2023-02-25 ENCOUNTER — Telehealth: Payer: Self-pay | Admitting: Pediatrics

## 2023-02-25 NOTE — Telephone Encounter (Signed)
Called patient to check in after ED visit. She voiced understanding of issues addressed in the ED.  She feels tired today but did have jardiance arrive and took it this morning. She received some oral diuresis in the ED but still feels a little short of breath (same as prior).  Took her 80mg  lasix this morning.   I will check on patient again this afternoon. Suspect jardiance and lasix dosing will help her start to feel better. She denies any new or worsening symptoms at this time.  Kristain Hu Howell Pringle, MD

## 2023-03-01 ENCOUNTER — Other Ambulatory Visit: Payer: Medicare HMO

## 2023-03-01 NOTE — Progress Notes (Unsigned)
03/01/2023  Patient ID: Cindy [name], female   DOB: 28-Nov-1950, 72 y.o.   MRN: [id number]  Patient outreach to verify Ms Mazeika received [place] 25mg  from pharmacy that now processes PAP medications for [facility].  -She has received Jardiance 25mg  from PAP -Also enrolled in PAP for Eliquis and Ozempic- will coordinate with medication assistance team to add to PAP spreadsheet, so we can assist with program renewals -Reviewed complete medication list for accuracy -No additional barriers to adherence identified  [name], PharmD, DPLA

## 2023-03-03 DIAGNOSIS — R0609 Other forms of dyspnea: Secondary | ICD-10-CM | POA: Diagnosis not present

## 2023-03-03 DIAGNOSIS — R002 Palpitations: Secondary | ICD-10-CM | POA: Diagnosis not present

## 2023-03-03 DIAGNOSIS — R011 Cardiac murmur, unspecified: Secondary | ICD-10-CM | POA: Diagnosis not present

## 2023-03-03 DIAGNOSIS — R0602 Shortness of breath: Secondary | ICD-10-CM | POA: Diagnosis not present

## 2023-03-03 DIAGNOSIS — R42 Dizziness and giddiness: Secondary | ICD-10-CM | POA: Diagnosis not present

## 2023-03-07 DIAGNOSIS — R0609 Other forms of dyspnea: Secondary | ICD-10-CM | POA: Diagnosis not present

## 2023-03-07 DIAGNOSIS — R0602 Shortness of breath: Secondary | ICD-10-CM | POA: Diagnosis not present

## 2023-03-08 ENCOUNTER — Other Ambulatory Visit: Payer: Self-pay | Admitting: Nurse Practitioner

## 2023-03-08 NOTE — Telephone Encounter (Signed)
Copied from CRM [id number]. Topic: General - Other >> Mar 08, 2023  9:10 AM Phill Myron wrote: I would like to give an update, regarding my visit to [facility] (Heart)

## 2023-03-11 ENCOUNTER — Other Ambulatory Visit: Payer: Self-pay | Admitting: Nurse Practitioner

## 2023-03-11 NOTE — Telephone Encounter (Signed)
Requested Prescriptions  Pending Prescriptions Disp Refills   DULoxetine (CYMBALTA) 60 MG capsule [Pharmacy Med Name: DULOXETINE DR 60MG  CAPSULES] 90 capsule 1    Sig: TAKE 1 CAPSULE(60 MG) BY MOUTH DAILY     Psychiatry: Antidepressants - SNRI - duloxetine Failed - 03/11/2023  3:40 AM      Failed - Cr in normal range and within 360 days    Creatinine  Date Value Ref Range Status  01/24/2014 0.86 0.60 - 1.30 mg/dL Final   Creatinine, Ser  Date Value Ref Range Status  02/22/2023 1.13 (H) 0.57 - 1.00 mg/dL Final         Failed - Last BP in normal range    BP Readings from Last 1 Encounters:  02/24/23 (!) 160/74         Passed - eGFR is 30 or above and within 360 days    EGFR (African American)  Date Value Ref Range Status  01/24/2014 >60  Final   GFR calc Af Amer  Date Value Ref Range Status  03/19/2017 >60 >60 mL/min Final    Comment:    (NOTE) The eGFR has been calculated using the CKD EPI equation. This calculation has not been validated in all clinical situations. eGFR's persistently <60 mL/min signify possible Chronic Kidney Disease.    EGFR (Non-African Amer.)  Date Value Ref Range Status  01/24/2014 >60  Final    Comment:    eGFR values <66mL/min/1.73 m2 may be an indication of chronic kidney disease (CKD). Calculated eGFR is useful in patients with stable renal function. The eGFR calculation will not be reliable in acutely ill patients when serum creatinine is changing rapidly. It is not useful in  patients on dialysis. The eGFR calculation may not be applicable to patients at the low and high extremes of body sizes, pregnant women, and vegetarians.    GFR, Estimated  Date Value Ref Range Status  04/05/2022 33 (L) >60 mL/min Final    Comment:    (NOTE) Calculated using the CKD-EPI Creatinine Equation (2021)    eGFR  Date Value Ref Range Status  02/22/2023 51 (L) >59 mL/min/1.73 Final         Passed - Completed PHQ-2 or PHQ-9 in the last 360 days       Passed - Valid encounter within last 6 months    Recent Outpatient Visits           2 weeks ago Dyspnea on exertion   [facility] Jackolyn Confer, MD   2 weeks ago Hypertension associated with diabetes [facility])   [place] [facility] Jackolyn Confer, MD   2 months ago Type 2 diabetes mellitus with morbid obesity (HCC)   [place] [facility] [place], [place] T, NP   5 months ago Type 2 diabetes mellitus with morbid obesity (HCC)   [place] [facility] [place], [place] T, NP   8 months ago Type 2 diabetes mellitus with morbid obesity (HCC)   [place] [facility] [place], Dorie Rank, NP       Future Appointments             In 1 month [place], Dorie Rank, NP [facility] [facility]

## 2023-03-15 DIAGNOSIS — I3139 Other pericardial effusion (noninflammatory): Secondary | ICD-10-CM | POA: Diagnosis not present

## 2023-03-15 DIAGNOSIS — I517 Cardiomegaly: Secondary | ICD-10-CM | POA: Diagnosis not present

## 2023-03-15 DIAGNOSIS — I351 Nonrheumatic aortic (valve) insufficiency: Secondary | ICD-10-CM | POA: Diagnosis not present

## 2023-03-15 DIAGNOSIS — I501 Left ventricular failure: Secondary | ICD-10-CM | POA: Diagnosis not present

## 2023-03-23 ENCOUNTER — Other Ambulatory Visit: Payer: Self-pay

## 2023-03-23 ENCOUNTER — Telehealth: Payer: Self-pay | Admitting: *Deleted

## 2023-03-23 DIAGNOSIS — R0609 Other forms of dyspnea: Secondary | ICD-10-CM | POA: Diagnosis not present

## 2023-03-23 DIAGNOSIS — R931 Abnormal findings on diagnostic imaging of heart and coronary circulation: Secondary | ICD-10-CM | POA: Diagnosis not present

## 2023-03-23 DIAGNOSIS — I517 Cardiomegaly: Secondary | ICD-10-CM | POA: Diagnosis not present

## 2023-03-23 DIAGNOSIS — R002 Palpitations: Secondary | ICD-10-CM | POA: Diagnosis not present

## 2023-03-23 DIAGNOSIS — I3139 Other pericardial effusion (noninflammatory): Secondary | ICD-10-CM | POA: Diagnosis not present

## 2023-03-23 DIAGNOSIS — R011 Cardiac murmur, unspecified: Secondary | ICD-10-CM | POA: Diagnosis not present

## 2023-03-23 DIAGNOSIS — R0602 Shortness of breath: Secondary | ICD-10-CM | POA: Diagnosis not present

## 2023-03-23 DIAGNOSIS — R42 Dizziness and giddiness: Secondary | ICD-10-CM | POA: Diagnosis not present

## 2023-03-23 DIAGNOSIS — R9431 Abnormal electrocardiogram [ECG] [EKG]: Secondary | ICD-10-CM | POA: Diagnosis not present

## 2023-03-23 NOTE — Patient Outreach (Signed)
Care Management   Follow Up Note   03/23/2023 Name: Cindy [name] MRN: [id number] DOB: August 03, 1950   Referred by: Marjie Skiff, NP Reason for referral : Care Management (RNCM: Follow up for Chronic Disease Management & Care Coordination Needs Attempt.)   An unsuccessful telephone outreach was attempted today. The patient was referred to the case management team for assistance with care management and care coordination.   Follow Up Plan: The patient has been provided with contact information for the care management team and has been advised to call with any health related questions or concerns.   Danise Edge, BSN RN RN Care Manager  [facility]  Ambulatory Care Management  Direct Number: [phone]

## 2023-03-24 ENCOUNTER — Other Ambulatory Visit: Payer: Self-pay | Admitting: Nurse Practitioner

## 2023-03-25 ENCOUNTER — Other Ambulatory Visit: Payer: Self-pay | Admitting: *Deleted

## 2023-03-25 ENCOUNTER — Other Ambulatory Visit: Payer: Self-pay

## 2023-03-25 NOTE — Patient Instructions (Signed)
Visit Information  Thank you for taking time to visit with me today. Please don't hesitate to contact me if I can be of assistance to you before our next scheduled telephone appointment.  Following are the goals we discussed today:   Goals Addressed             This Visit's Progress    RNCM Care Management Expected Outcome:  Monitor, Self-Manage and Reduce Symptoms of Afib       Current Barriers:  Chronic Disease Management support and education needs related to effective management of AFIB  Planned Interventions: Provider order and care plan reviewed. Collaborated with PharmD regarding patient care and plan. The patient is working with the pharm D for assistance for getting Eliquis. The patient states she has been approved for the Eliquis and is thankful that she has this. She denies any new issues with her AFIB and heart health. Counseled on increased risk of stroke due to Afib and benefits of anticoagulation for stroke prevention. Denies any safety concerns or new issues related to increased blood pressures or changes that put her at increase risk of HA or stroke           Reviewed importance of adherence to anticoagulant exactly as prescribed. The patient is complaint with medications. Is getting Eliquis and taking as prescribed. Works with pharm D on a regular basis. Advised patient to discuss changes in her heart rate or AFIB with provider Counseled on bleeding risk associated with AFIB and importance of self-monitoring for signs/symptoms of bleeding Counseled on avoidance of NSAIDs due to increased bleeding risk with anticoagulants Counseled on importance of regular laboratory monitoring as prescribed. Has lab work on a regular basis. The patient is complaint Counseled on seeking medical attention after a head injury or if there is blood in the urine/stool. Review of being safe and watching for potential fall risk.  Afib action plan reviewed Screening for signs and symptoms of  depression related to chronic disease state Assessed social determinant of health barriers Is using over the counter pain patches. The patient states she is doing okay. She paces her activity and monitors for changes.  Symptom Management: Take medications as prescribed   Attend all scheduled provider appointments Call provider office for new concerns or questions  call the Suicide and Crisis Lifeline: 988 call the [place] National Suicide Prevention Lifeline: 817-647-2457 or TTY: (340)397-1375 TTY [phone]) to talk to a trained counselor call 1-800-273-TALK (toll free, 24 hour hotline) if experiencing a Mental Health or Behavioral Health Crisis  - make a plan to eat healthy - keep all lab appointments - take medicine as prescribed  Follow Up Plan: Telephone follow up appointment with care management team member scheduled for: 04-15-2023 at 1 pm       RNCM Care Management Expected Outcome:  Monitor, Self-Manage and Reduce Symptoms of Diabetes       Current Barriers:  Chronic Disease Management support and education needs related to effective management of DM Financial Constraints.  Lab Results  Component Value Date   HGBA1C 5.9 (H) 01/05/2023     Planned Interventions: Provided education to patient about basic DM disease process. Review and education provided. The patient states that she is happy with the way she is managing her DM. She is wanting to lose more weight.  Discussed losing weight slowly. He DM is stable and she denies any new concerns. She is happy that her her A1C is stable and staying low. ; Reviewed medications with patient  and discussed importance of medication adherence. The patient is working with the pharm D for ongoing support and education and medication management. Has paperwork to submit for medication assistance for Jardiance.  States she has been having trouble remembering to order and needs help completed PAP paperwork. States she prefers if the medication is  on auto delivery so she does not have any issues like she has been for the last couple of weeks.  Reviewed prescribed diet with patient heart healthy/ADA diet. Review and education provided. The patient states that she is watching her portion size; Counseled on importance of regular laboratory monitoring as prescribed. Has lab work on a regular basis. Discussed plans with patient for ongoing care management follow up and provided patient with direct contact information for care management team;      Provided patient with written educational materials related to hypo and hyperglycemia and importance of correct treatment;       Reviewed scheduled/upcoming provider appointments including: 04-13-2023 at 10 am;         Advised patient, providing education and rationale, to check cbg once daily and when you have symptoms of low or high blood sugar and record. The patient denies any hypoglycemic events       Review of patient status, including review of consultants reports, relevant laboratory and other test results, and medications completed. Has labs on a regular basis.;       Advised patient to discuss changes in DM or questions or concerns with provider;      Screening for signs and symptoms of depression related to chronic disease state;        Assessed social determinant of health barriers;     She is having some pain in her feet and it may be neuropathy. Education provided. Will continue to monitor for changes or new needs.     Symptom Management: Take medications as prescribed   Attend all scheduled provider appointments Call provider office for new concerns or questions  call the Suicide and Crisis Lifeline: 988 call the [place] National Suicide Prevention Lifeline: (346)258-6844 or TTY: 226-152-9314 TTY [phone]) to talk to a trained counselor call 1-800-273-TALK (toll free, 24 hour hotline) if experiencing a Mental Health or Behavioral Health Crisis  check feet daily for cuts, sores or  redness trim toenails straight across manage portion size wash and dry feet carefully every day wear comfortable, cotton socks wear comfortable, well-fitting shoes  Follow Up Plan: Telephone follow up appointment with care management team member scheduled for: 04-15-2023 at 1 pm       RNCM Care Management Expected Outcome:  Monitor, Self-Manage and Reduce Symptoms of: CKD       Current Barriers:  Knowledge Deficits related to importance of kidney protection and monitoring for dehydration or changes in conditions that may impact kidney function Chronic Disease Management support and education needs related to effective management of CKD EGFR at 30  Planned Interventions: Assessed the patient  understanding of chronic kidney disease. The patient has a good understanding of her CKD. The patient states that her CKD is stable. She states that they are monitoring her closely and she is following the plan of care. Knows when to call the provider for changes.  Kidney function stable  at this time. Stated she has a recent visit due to her having heart issues and she was told that her kidney function has been elevated and for her to try to adhere to the medication regime as this will help her  kidney function improve. Evaluation of current treatment plan related to chronic kidney disease self management and patient's adherence to plan as established by provider      Provided education to patient re: stroke prevention, s/s of heart attack and stroke    Reviewed prescribed diet heart healthy/ADA diet. Is compliant with dietary restrictions. Is monitoring for changes in her diet. Reviewed medications with patient and discussed importance of compliance. The patient is compliant with medications. Denies any issues with medications at this time.     Advised patient, providing education and rationale, to monitor blood pressure daily and record, calling PCP for findings outside established parameters. Blood  pressures are more stable and doing good. The patient states she is feeling better.     Discussed complications of poorly controlled blood pressure such as heart disease, stroke, circulatory complications, vision complications, kidney impairment, sexual dysfunction    Reviewed scheduled/upcoming provider appointments including: 04-13-2023 at 10 am.  Advised patient to discuss changes in her kidney function, questions or concerns with provider    Discussed plans with patient for ongoing care management follow up and provided patient with direct contact information for care management team    Screening for signs and symptoms of depression related to chronic disease state      Discussed the impact of chronic kidney disease on daily life and mental health and acknowledged and normalized feelings of disempowerment, fear, and frustration    Assessed social determinant of health barriers    Provided education on kidney disease progression    Engage patient in early, proactive and ongoing discussion about goals of care and what matters most to them    Support coping and stress management by recognizing current strategies and assist in developing new strategies such as mindfulness, journaling, relaxation techniques, problem-solving     Symptom Management: Take medications as prescribed   Attend all scheduled provider appointments Call provider office for new concerns or questions  call the Suicide and Crisis Lifeline: 988 call the [place] National Suicide Prevention Lifeline: 4426108544 or TTY: 319-778-8936 TTY [phone]) to talk to a trained counselor call 1-800-273-TALK (toll free, 24 hour hotline) if experiencing a Mental Health or Behavioral Health Crisis   Follow Up Plan: Telephone follow up appointment with care management team member scheduled for: 04-15-2023 at 1 pm       RNCM Care Management Expected Outcome:  Monitor, Self-Manage, and Reduce Symptoms of Hypertension       Current  Barriers:  Chronic Disease Management support and education needs related to effective management of HTN BP Readings from Last 3 Encounters:  02/24/23 (!) 160/74  02/22/23 (!) 187/47  01/05/23 125/65     Planned Interventions: Evaluation of current treatment plan related to hypertension self management and patient's adherence to plan as established by provider. The patient states that her blood pressures has been somewhat elevated recently and was in office and correlates this to her having issues with her heart function. She is now back on her Jardiance. She states she has not be regularly checking her Bps nor has she been weighing at home. Provided education to patient re: stroke prevention, s/s of heart attack and stroke; Reviewed prescribed diet heart healthy/ADA diet. Education provided on heart healthy/ADA diet. The patient wants to lose weight. She is watching her dietary intake and has a goal of getting to 150 pounds.  Currently her weight is 205. She denies any new concerns with her dietary restrictions Reviewed medications with patient and discussed importance of  compliance. The patient is compliant with medications. Working with the pharm D for management of medications and PAP assistance. Continues to work with pharm D. Denies any issues with medications at this time. ;  Discussed plans with patient for ongoing care management follow up and provided patient with direct contact information for care management team; Advised patient, providing education and rationale, to monitor blood pressure daily and record, calling PCP for findings outside established parameters;  Reviewed scheduled/upcoming provider appointments including: 04-13-2023 at 1000 am,  Advised patient to discuss changes in blood pressure or heart health with provider; Provided education on prescribed diet heart healthy/ADA diet ;  Discussed complications of poorly controlled blood pressure such as heart disease, stroke,  circulatory complications, vision complications, kidney impairment, sexual dysfunction;  Screening for signs and symptoms of depression related to chronic disease state;  Assessed social determinant of health barriers;  Is thankful that her bowels are doing much better. She is on a regimen that is working well for her. She says her abdomen feels much better.   Symptom Management: Take medications as prescribed   Attend all scheduled provider appointments Call provider office for new concerns or questions  call the Suicide and Crisis Lifeline: 988 call the [place] National Suicide Prevention Lifeline: 646-106-4327 or TTY: 903-360-7780 TTY [phone]) to talk to a trained counselor call 1-800-273-TALK (toll free, 24 hour hotline) if experiencing a Mental Health or Behavioral Health Crisis  check blood pressure weekly call doctor for signs and symptoms of high blood pressure develop an action plan for high blood pressure keep all doctor appointments take medications for blood pressure exactly as prescribed report new symptoms to your doctor  Follow Up Plan: Telephone follow up appointment with care management team member scheduled for: 04-15-2023 at 1 pm           Our next appointment is by telephone on 04-15-2023 at 1:00 pm  Please call the care guide team at [phone] if you need to cancel or reschedule your appointment.   If you are experiencing a Mental Health or Behavioral Health Crisis or need someone to talk to, please call the Suicide and Crisis Lifeline: 988 call the [place] National Suicide Prevention Lifeline: 831-478-8293 or TTY: 4800624318 TTY 670-344-7651) to talk to a trained counselor call 1-800-273-TALK (toll free, 24 hour hotline) call 911   The patient verbalized understanding of instructions, educational materials, and care plan provided today and DECLINED offer to receive copy of patient instructions, educational materials, and care plan.   Danise Edge, BSN RN RN Care Manager  [place]  [facility]  Direct Number: [phone]

## 2023-03-25 NOTE — Patient Outreach (Signed)
Care Management   Visit Note  03/25/2023 Name: Cindy [name] MRN: [id number] DOB: September 14, 1950  Subjective: Cindy [name] is a 72 y.o. year old female who is a primary care patient of Cannady, Dorie Rank, NP. The Care Management team was consulted for assistance.      Engaged with patient spoke with patient by telephone.    Goals Addressed             This Visit's Progress    RNCM Care Management Expected Outcome:  Monitor, Self-Manage and Reduce Symptoms of Afib       Current Barriers:  Chronic Disease Management support and education needs related to effective management of AFIB  Planned Interventions: Provider order and care plan reviewed. Collaborated with PharmD regarding patient care and plan. The patient is working with the pharm D for assistance for getting Eliquis. The patient states she has been approved for the Eliquis and is thankful that she has this. She denies any new issues with her AFIB and heart health. Counseled on increased risk of stroke due to Afib and benefits of anticoagulation for stroke prevention. Denies any safety concerns or new issues related to increased blood pressures or changes that put her at increase risk of HA or stroke           Reviewed importance of adherence to anticoagulant exactly as prescribed. The patient is complaint with medications. Is getting Eliquis and taking as prescribed. Works with pharm D on a regular basis. Advised patient to discuss changes in her heart rate or AFIB with provider Counseled on bleeding risk associated with AFIB and importance of self-monitoring for signs/symptoms of bleeding Counseled on avoidance of NSAIDs due to increased bleeding risk with anticoagulants Counseled on importance of regular laboratory monitoring as prescribed. Has lab work on a regular basis. The patient is complaint Counseled on seeking medical attention after a head injury or if there is blood in the urine/stool. Review of being safe and  watching for potential fall risk.  Afib action plan reviewed Screening for signs and symptoms of depression related to chronic disease state Assessed social determinant of health barriers Is using over the counter pain patches. The patient states she is doing okay. She paces her activity and monitors for changes.  Symptom Management: Take medications as prescribed   Attend all scheduled provider appointments Call provider office for new concerns or questions  call the Suicide and Crisis Lifeline: 988 call the [place] National Suicide Prevention Lifeline: 825-727-9209 or TTY: 717-153-4101 TTY 318-377-8560) to talk to a trained counselor call 1-800-273-TALK (toll free, 24 hour hotline) if experiencing a Mental Health or Behavioral Health Crisis  - make a plan to eat healthy - keep all lab appointments - take medicine as prescribed  Follow Up Plan: Telephone follow up appointment with care management team member scheduled for: 04-15-2023 at 1 pm       RNCM Care Management Expected Outcome:  Monitor, Self-Manage and Reduce Symptoms of Diabetes       Current Barriers:  Chronic Disease Management support and education needs related to effective management of DM Financial Constraints.  Lab Results  Component Value Date   HGBA1C 5.9 (H) 01/05/2023     Planned Interventions: Provided education to patient about basic DM disease process. Review and education provided. The patient states that she is happy with the way she is managing her DM. She is wanting to lose more weight.  Discussed losing weight slowly. He DM is stable and she denies  any new concerns. She is happy that her her A1C is stable and staying low. ; Reviewed medications with patient and discussed importance of medication adherence. The patient is working with the pharm D for ongoing support and education and medication management. Has paperwork to submit for medication assistance for Jardiance.  States she has been having trouble  remembering to order and needs help completed PAP paperwork. States she prefers if the medication is on auto delivery so she does not have any issues like she has been for the last couple of weeks.  Reviewed prescribed diet with patient heart healthy/ADA diet. Review and education provided. The patient states that she is watching her portion size; Counseled on importance of regular laboratory monitoring as prescribed. Has lab work on a regular basis. Discussed plans with patient for ongoing care management follow up and provided patient with direct contact information for care management team;      Provided patient with written educational materials related to hypo and hyperglycemia and importance of correct treatment;       Reviewed scheduled/upcoming provider appointments including: 04-13-2023 at 10 am;         Advised patient, providing education and rationale, to check cbg once daily and when you have symptoms of low or high blood sugar and record. The patient denies any hypoglycemic events       Review of patient status, including review of consultants reports, relevant laboratory and other test results, and medications completed. Has labs on a regular basis.;       Advised patient to discuss changes in DM or questions or concerns with provider;      Screening for signs and symptoms of depression related to chronic disease state;        Assessed social determinant of health barriers;     She is having some pain in her feet and it may be neuropathy. Education provided. Will continue to monitor for changes or new needs.     Symptom Management: Take medications as prescribed   Attend all scheduled provider appointments Call provider office for new concerns or questions  call the Suicide and Crisis Lifeline: 988 call the [place] National Suicide Prevention Lifeline: (801) 539-7566 or TTY: (343) 517-0197 TTY 2396070753) to talk to a trained counselor call 1-800-273-TALK (toll free, 24 hour  hotline) if experiencing a Mental Health or Behavioral Health Crisis  check feet daily for cuts, sores or redness trim toenails straight across manage portion size wash and dry feet carefully every day wear comfortable, cotton socks wear comfortable, well-fitting shoes  Follow Up Plan: Telephone follow up appointment with care management team member scheduled for: 04-15-2023 at 1 pm       RNCM Care Management Expected Outcome:  Monitor, Self-Manage and Reduce Symptoms of: CKD       Current Barriers:  Knowledge Deficits related to importance of kidney protection and monitoring for dehydration or changes in conditions that may impact kidney function Chronic Disease Management support and education needs related to effective management of CKD EGFR at 30  Planned Interventions: Assessed the patient  understanding of chronic kidney disease. The patient has a good understanding of her CKD. The patient states that her CKD is stable. She states that they are monitoring her closely and she is following the plan of care. Knows when to call the provider for changes.  Kidney function stable  at this time. Stated she has a recent visit due to her having heart issues and she was told that her kidney  function has been elevated and for her to try to adhere to the medication regime as this will help her kidney function improve. Evaluation of current treatment plan related to chronic kidney disease self management and patient's adherence to plan as established by provider      Provided education to patient re: stroke prevention, s/s of heart attack and stroke    Reviewed prescribed diet heart healthy/ADA diet. Is compliant with dietary restrictions. Is monitoring for changes in her diet. Reviewed medications with patient and discussed importance of compliance. The patient is compliant with medications. Denies any issues with medications at this time.     Advised patient, providing education and rationale, to  monitor blood pressure daily and record, calling PCP for findings outside established parameters. Blood pressures are more stable and doing good. The patient states she is feeling better.     Discussed complications of poorly controlled blood pressure such as heart disease, stroke, circulatory complications, vision complications, kidney impairment, sexual dysfunction    Reviewed scheduled/upcoming provider appointments including: 04-13-2023 at 10 am.  Advised patient to discuss changes in her kidney function, questions or concerns with provider    Discussed plans with patient for ongoing care management follow up and provided patient with direct contact information for care management team    Screening for signs and symptoms of depression related to chronic disease state      Discussed the impact of chronic kidney disease on daily life and mental health and acknowledged and normalized feelings of disempowerment, fear, and frustration    Assessed social determinant of health barriers    Provided education on kidney disease progression    Engage patient in early, proactive and ongoing discussion about goals of care and what matters most to them    Support coping and stress management by recognizing current strategies and assist in developing new strategies such as mindfulness, journaling, relaxation techniques, problem-solving     Symptom Management: Take medications as prescribed   Attend all scheduled provider appointments Call provider office for new concerns or questions  call the Suicide and Crisis Lifeline: 988 call the [place] National Suicide Prevention Lifeline: 432-332-7695 or TTY: (970)733-5990 TTY 725-647-0017) to talk to a trained counselor call 1-800-273-TALK (toll free, 24 hour hotline) if experiencing a Mental Health or Behavioral Health Crisis   Follow Up Plan: Telephone follow up appointment with care management team member scheduled for: 04-15-2023 at 1 pm       RNCM Care  Management Expected Outcome:  Monitor, Self-Manage, and Reduce Symptoms of Hypertension       Current Barriers:  Chronic Disease Management support and education needs related to effective management of HTN BP Readings from Last 3 Encounters:  02/24/23 (!) 160/74  02/22/23 (!) 187/47  01/05/23 125/65     Planned Interventions: Evaluation of current treatment plan related to hypertension self management and patient's adherence to plan as established by provider. The patient states that her blood pressures has been somewhat elevated recently and was in office and correlates this to her having issues with her heart function. She is now back on her Jardiance. She states she has not be regularly checking her Bps nor has she been weighing at home. Provided education to patient re: stroke prevention, s/s of heart attack and stroke; Reviewed prescribed diet heart healthy/ADA diet. Education provided on heart healthy/ADA diet. The patient wants to lose weight. She is watching her dietary intake and has a goal of getting to 150 pounds.  Currently her  weight is 205. She denies any new concerns with her dietary restrictions Reviewed medications with patient and discussed importance of compliance. The patient is compliant with medications. Working with the pharm D for management of medications and PAP assistance. Continues to work with pharm D. Denies any issues with medications at this time. ;  Discussed plans with patient for ongoing care management follow up and provided patient with direct contact information for care management team; Advised patient, providing education and rationale, to monitor blood pressure daily and record, calling PCP for findings outside established parameters;  Reviewed scheduled/upcoming provider appointments including: 04-13-2023 at 1000 am,  Advised patient to discuss changes in blood pressure or heart health with provider; Provided education on prescribed diet heart healthy/ADA  diet ;  Discussed complications of poorly controlled blood pressure such as heart disease, stroke, circulatory complications, vision complications, kidney impairment, sexual dysfunction;  Screening for signs and symptoms of depression related to chronic disease state;  Assessed social determinant of health barriers;  Is thankful that her bowels are doing much better. She is on a regimen that is working well for her. She says her abdomen feels much better.   Symptom Management: Take medications as prescribed   Attend all scheduled provider appointments Call provider office for new concerns or questions  call the Suicide and Crisis Lifeline: 988 call the [place] National Suicide Prevention Lifeline: 940-258-5886 or TTY: 803-440-8071 TTY [phone]) to talk to a trained counselor call 1-800-273-TALK (toll free, 24 hour hotline) if experiencing a Mental Health or Behavioral Health Crisis  check blood pressure weekly call doctor for signs and symptoms of high blood pressure develop an action plan for high blood pressure keep all doctor appointments take medications for blood pressure exactly as prescribed report new symptoms to your doctor  Follow Up Plan: Telephone follow up appointment with care management team member scheduled for: 04-15-2023 at 1 pm         :     Consent to Services:  Patient was given information about care management services, agreed to services, and gave verbal consent to participate.   Plan: Telephone follow up appointment with care management team member scheduled for: 04-15-2023 at 1:00 pm  Danise Edge, BSN RN RN Care Manager  [facility]  Ambulatory Care Management  Direct Number: [phone]

## 2023-03-25 NOTE — Telephone Encounter (Signed)
Requested Prescriptions  Pending Prescriptions Disp Refills   atorvastatin (LIPITOR) 40 MG tablet [Pharmacy Med Name: ATORVASTATIN 40MG  TABLETS] 90 tablet 1    Sig: TAKE 1 TABLET(40 MG) BY MOUTH DAILY     Cardiovascular:  Antilipid - Statins Failed - 03/24/2023  3:39 AM      Failed - Lipid Panel in normal range within the last 12 months    Cholesterol, Total  Date Value Ref Range Status  01/05/2023 146 100 - 199 mg/dL Final   LDL Chol Calc (NIH)  Date Value Ref Range Status  01/05/2023 80 0 - 99 mg/dL Final   HDL  Date Value Ref Range Status  01/05/2023 48 >39 mg/dL Final   Triglycerides  Date Value Ref Range Status  01/05/2023 94 0 - 149 mg/dL Final         Passed - Patient is not pregnant      Passed - Valid encounter within last 12 months    Recent Outpatient Visits           4 weeks ago Dyspnea on exertion   [facility] Jackolyn Confer, MD   1 month ago Hypertension associated with diabetes [facility])   [place] [facility] Jackolyn Confer, MD   2 months ago Type 2 diabetes mellitus with morbid obesity (HCC)   [place] [facility] [place], [place] T, NP   5 months ago Type 2 diabetes mellitus with morbid obesity (HCC)   [place] [facility] [place], [place] T, NP   9 months ago Type 2 diabetes mellitus with morbid obesity (HCC)   [place] [facility] [place], Dorie Rank, NP       Future Appointments             In 2 weeks Cannady, Dorie Rank, NP [facility], [facility]

## 2023-03-28 ENCOUNTER — Other Ambulatory Visit: Payer: Self-pay | Admitting: Pediatrics

## 2023-03-28 DIAGNOSIS — E1159 Type 2 diabetes mellitus with other circulatory complications: Secondary | ICD-10-CM

## 2023-03-29 NOTE — Telephone Encounter (Signed)
Requested Prescriptions  Pending Prescriptions Disp Refills   spironolactone (ALDACTONE) 25 MG tablet [Pharmacy Med Name: SPIRONOLACTONE 25MG  TABLETS] 180 tablet 0    Sig: TAKE 2 TABLETS(50 MG) BY MOUTH DAILY     Cardiovascular: Diuretics - Aldosterone Antagonist Failed - 03/28/2023  3:38 AM      Failed - Cr in normal range and within 180 days    Creatinine  Date Value Ref Range Status  01/24/2014 0.86 0.60 - 1.30 mg/dL Final   Creatinine, Ser  Date Value Ref Range Status  02/22/2023 1.13 (H) 0.57 - 1.00 mg/dL Final         Failed - Last BP in normal range    BP Readings from Last 1 Encounters:  02/24/23 (!) 160/74         Passed - K in normal range and within 180 days    Potassium  Date Value Ref Range Status  02/22/2023 3.5 3.5 - 5.2 mmol/L Final  01/24/2014 3.4 (L) 3.5 - 5.1 mmol/L Final         Passed - Na in normal range and within 180 days    Sodium  Date Value Ref Range Status  02/22/2023 144 134 - 144 mmol/L Final  01/24/2014 143 136 - 145 mmol/L Final         Passed - eGFR is 30 or above and within 180 days    EGFR (African American)  Date Value Ref Range Status  01/24/2014 >60  Final   GFR calc Af Amer  Date Value Ref Range Status  03/19/2017 >60 >60 mL/min Final    Comment:    (NOTE) The eGFR has been calculated using the CKD EPI equation. This calculation has not been validated in all clinical situations. eGFR's persistently <60 mL/min signify possible Chronic Kidney Disease.    EGFR (Non-African Amer.)  Date Value Ref Range Status  01/24/2014 >60  Final    Comment:    eGFR values <93mL/min/1.73 m2 may be an indication of chronic kidney disease (CKD). Calculated eGFR is useful in patients with stable renal function. The eGFR calculation will not be reliable in acutely ill patients when serum creatinine is changing rapidly. It is not useful in  patients on dialysis. The eGFR calculation may not be applicable to patients at the low and high  extremes of body sizes, pregnant women, and vegetarians.    GFR, Estimated  Date Value Ref Range Status  04/05/2022 33 (L) >60 mL/min Final    Comment:    (NOTE) Calculated using the CKD-EPI Creatinine Equation (2021)    eGFR  Date Value Ref Range Status  02/22/2023 51 (L) >59 mL/min/1.73 Final         Passed - Valid encounter within last 6 months    Recent Outpatient Visits           1 month ago Dyspnea on exertion   [facility] [facility] Jackolyn Confer, MD   1 month ago Hypertension associated with diabetes [facility])   [place] [facility]yn Confer, MD   2 months ago Type 2 diabetes mellitus with morbid obesity (HCC)   [place] [facility] [place], [place] T, NP   6 months ago Type 2 diabetes mellitus with morbid obesity (HCC)   [place] [facility] [place], [place] T, NP   9 months ago Type 2 diabetes mellitus with morbid obesity (HCC)   [place] [facility] [place], Dorie Rank, NP  Future Appointments             In 2 weeks Cannady, Dorie Rank, NP [place] [facility], [facility]

## 2023-04-06 DIAGNOSIS — R0609 Other forms of dyspnea: Secondary | ICD-10-CM | POA: Diagnosis not present

## 2023-04-06 DIAGNOSIS — R42 Dizziness and giddiness: Secondary | ICD-10-CM | POA: Diagnosis not present

## 2023-04-06 DIAGNOSIS — R011 Cardiac murmur, unspecified: Secondary | ICD-10-CM | POA: Diagnosis not present

## 2023-04-06 DIAGNOSIS — R9431 Abnormal electrocardiogram [ECG] [EKG]: Secondary | ICD-10-CM | POA: Diagnosis not present

## 2023-04-06 DIAGNOSIS — I4891 Unspecified atrial fibrillation: Secondary | ICD-10-CM | POA: Diagnosis not present

## 2023-04-06 DIAGNOSIS — I3139 Other pericardial effusion (noninflammatory): Secondary | ICD-10-CM | POA: Diagnosis not present

## 2023-04-06 DIAGNOSIS — R002 Palpitations: Secondary | ICD-10-CM | POA: Diagnosis not present

## 2023-04-06 DIAGNOSIS — I517 Cardiomegaly: Secondary | ICD-10-CM | POA: Diagnosis not present

## 2023-04-06 DIAGNOSIS — R931 Abnormal findings on diagnostic imaging of heart and coronary circulation: Secondary | ICD-10-CM | POA: Diagnosis not present

## 2023-04-09 ENCOUNTER — Other Ambulatory Visit: Payer: Self-pay | Admitting: Nurse Practitioner

## 2023-04-10 DIAGNOSIS — I3139 Other pericardial effusion (noninflammatory): Secondary | ICD-10-CM | POA: Insufficient documentation

## 2023-04-10 NOTE — Patient Instructions (Signed)

## 2023-04-11 DIAGNOSIS — R42 Dizziness and giddiness: Secondary | ICD-10-CM | POA: Diagnosis not present

## 2023-04-11 DIAGNOSIS — R002 Palpitations: Secondary | ICD-10-CM | POA: Diagnosis not present

## 2023-04-11 NOTE — Telephone Encounter (Signed)
Requested medication (s) are due for refill today: expired medication date  Requested medication (s) are on the active medication list: yes   Last refill:  03/23/22 #180 4 refills  Future visit scheduled: yes in 2 days   Notes to clinic:  expired medication date . Do you want to renew Rx?     Requested Prescriptions  Pending Prescriptions Disp Refills   furosemide (LASIX) 80 MG tablet [Pharmacy Med Name: FUROSEMIDE 80MG  TABLETS] 180 tablet 4    Sig: TAKE 1 TABLET BY MOUTH TWICE DAILY FOR FLUID RETENTION     Cardiovascular:  Diuretics - Loop Failed - 04/09/2023  3:38 AM      Failed - Cr in normal range and within 180 days    Creatinine  Date Value Ref Range Status  01/24/2014 0.86 0.60 - 1.30 mg/dL Final   Creatinine, Ser  Date Value Ref Range Status  02/22/2023 1.13 (H) 0.57 - 1.00 mg/dL Final         Failed - Mg Level in normal range and within 180 days    Magnesium  Date Value Ref Range Status  12/21/2021 2.4 (H) 1.6 - 2.3 mg/dL Final         Failed - Last BP in normal range    BP Readings from Last 1 Encounters:  02/24/23 (!) 160/74         Passed - K in normal range and within 180 days    Potassium  Date Value Ref Range Status  02/22/2023 3.5 3.5 - 5.2 mmol/L Final  01/24/2014 3.4 (L) 3.5 - 5.1 mmol/L Final         Passed - Ca in normal range and within 180 days    Calcium  Date Value Ref Range Status  02/22/2023 9.3 8.7 - 10.3 mg/dL Final   Calcium, Total  Date Value Ref Range Status  01/24/2014 8.5 8.5 - 10.1 mg/dL Final         Passed - Na in normal range and within 180 days    Sodium  Date Value Ref Range Status  02/22/2023 144 134 - 144 mmol/L Final  01/24/2014 143 136 - 145 mmol/L Final         Passed - Cl in normal range and within 180 days    Chloride  Date Value Ref Range Status  02/22/2023 104 96 - 106 mmol/L Final  01/24/2014 105 98 - 107 mmol/L Final         Passed - Valid encounter within last 6 months    Recent Outpatient Visits            1 month ago Dyspnea on exertion   [place] [facility] Jackolyn Confer, MD   1 month ago Hypertension associated with diabetes [facility])   [place] [facility] Jackolyn Confer, MD   3 months ago Type 2 diabetes mellitus with morbid obesity (HCC)   [place] [facility] [place], [place] T, NP   6 months ago Type 2 diabetes mellitus with morbid obesity (HCC)   [place] [facility] [place], [place] T, NP   9 months ago Type 2 diabetes mellitus with morbid obesity (HCC)   [place] [facility] [place], Dorie Rank, NP       Future Appointments             In 2 days Marjie Skiff, NP [place] [facility], [facility]

## 2023-04-12 DIAGNOSIS — R0609 Other forms of dyspnea: Secondary | ICD-10-CM | POA: Diagnosis not present

## 2023-04-12 DIAGNOSIS — R002 Palpitations: Secondary | ICD-10-CM | POA: Diagnosis not present

## 2023-04-12 DIAGNOSIS — R011 Cardiac murmur, unspecified: Secondary | ICD-10-CM | POA: Diagnosis not present

## 2023-04-12 DIAGNOSIS — R42 Dizziness and giddiness: Secondary | ICD-10-CM | POA: Diagnosis not present

## 2023-04-12 DIAGNOSIS — R0602 Shortness of breath: Secondary | ICD-10-CM | POA: Diagnosis not present

## 2023-04-13 ENCOUNTER — Ambulatory Visit: Payer: Medicare HMO | Admitting: Nurse Practitioner

## 2023-04-13 ENCOUNTER — Encounter: Payer: Self-pay | Admitting: Nurse Practitioner

## 2023-04-13 DIAGNOSIS — D6869 Other thrombophilia: Secondary | ICD-10-CM

## 2023-04-13 DIAGNOSIS — E1169 Type 2 diabetes mellitus with other specified complication: Secondary | ICD-10-CM | POA: Diagnosis not present

## 2023-04-13 DIAGNOSIS — E1159 Type 2 diabetes mellitus with other circulatory complications: Secondary | ICD-10-CM

## 2023-04-13 DIAGNOSIS — E1122 Type 2 diabetes mellitus with diabetic chronic kidney disease: Secondary | ICD-10-CM

## 2023-04-13 DIAGNOSIS — I152 Hypertension secondary to endocrine disorders: Secondary | ICD-10-CM | POA: Diagnosis not present

## 2023-04-13 DIAGNOSIS — I480 Paroxysmal atrial fibrillation: Secondary | ICD-10-CM | POA: Diagnosis not present

## 2023-04-13 DIAGNOSIS — I3139 Other pericardial effusion (noninflammatory): Secondary | ICD-10-CM

## 2023-04-13 DIAGNOSIS — D631 Anemia in chronic kidney disease: Secondary | ICD-10-CM

## 2023-04-13 DIAGNOSIS — F321 Major depressive disorder, single episode, moderate: Secondary | ICD-10-CM

## 2023-04-13 DIAGNOSIS — E210 Primary hyperparathyroidism: Secondary | ICD-10-CM

## 2023-04-13 DIAGNOSIS — N184 Chronic kidney disease, stage 4 (severe): Secondary | ICD-10-CM

## 2023-04-13 DIAGNOSIS — E785 Hyperlipidemia, unspecified: Secondary | ICD-10-CM | POA: Diagnosis not present

## 2023-04-13 DIAGNOSIS — G4733 Obstructive sleep apnea (adult) (pediatric): Secondary | ICD-10-CM

## 2023-04-13 LAB — BAYER DCA HB A1C WAIVED: HB A1C (BAYER DCA - WAIVED): 5.6 % (ref 4.8–5.6)

## 2023-04-13 MED ORDER — MONTELUKAST SODIUM 10 MG PO TABS: 10.0000 mg | ORAL_TABLET | Freq: Every day | ORAL | 4 refills | Status: DC

## 2023-04-13 MED ORDER — OLMESARTAN MEDOXOMIL 40 MG PO TABS: 40.0000 mg | ORAL_TABLET | Freq: Every day | ORAL | 4 refills | Status: DC

## 2023-04-13 MED ORDER — MINOXIDIL 10 MG PO TABS: ORAL_TABLET | ORAL | 4 refills | Status: DC

## 2023-04-13 MED ORDER — BUSPIRONE HCL 5 MG PO TABS: 5.0000 mg | ORAL_TABLET | Freq: Two times a day (BID) | ORAL | 4 refills | Status: DC

## 2023-04-13 MED ORDER — AMLODIPINE BESYLATE 2.5 MG PO TABS: 2.5000 mg | ORAL_TABLET | Freq: Every day | ORAL | 4 refills | Status: DC

## 2023-04-13 NOTE — Assessment & Plan Note (Signed)
Chronic, ongoing CKD 3b.  At this time reduce and renal dose medications as needed + continue collaboration with nephrology.  Labs: A1c and BMP.  Avoid NSAIDs.  Refer to diabetes with obesity plan of care for further.  A1c stable at 5.6% today.

## 2023-04-13 NOTE — Assessment & Plan Note (Signed)
Chronic, ongoing with SBP slightly elevated on checks today.  At this time will add on low dose of Amlodipine 2.5 MG since has not taken Diltiazem in some time.  Maintain remainder of regimen.  Will reach out to Dr. Nedra Hai to alert them of change, as adding Amlodipine was discussed in their note.  Continue collaboration with cardiology.  Recommend she monitor BP at least a few mornings a week at home and document.  DASH diet at home.  Labs: BMP.

## 2023-04-13 NOTE — Assessment & Plan Note (Signed)
BMI 39.92 with T2DM, HTN, CKD with ongoing weight loss with Ozempic.  Recommended eating smaller high protein, low fat meals more frequently and exercising 30 mins a day 5 times a week with a goal of 10-15lb weight loss in the next 3 months. Patient voiced their understanding and motivation to adhere to these recommendations.

## 2023-04-13 NOTE — Assessment & Plan Note (Signed)
Chronic, ongoing.  Follows with nephrology.  Continue this collaboration, recent notes and labs reviewed. BMP today.

## 2023-04-13 NOTE — Assessment & Plan Note (Signed)
Chronic, ongoing, followed by cardiology.  Continue Eliquis and Metoprolol + collaboration with cardiology team.  Rate controlled.  Continue to collaborate with CCM team.  Recent notes reviewed.

## 2023-04-13 NOTE — Assessment & Plan Note (Addendum)
Chronic, stable.   Denies SI/HI.  Continue Duloxetine at this time, which also offers benefit to chronic pain and mood, + continue Buspar which has offered her benefit to anxiety.  Refills as needed.  Will adjust dosing or add medication as needed.

## 2023-04-13 NOTE — Assessment & Plan Note (Signed)
Chronic, ongoing.  Continue current medication regimen and adjust as needed.  Lipid panel obtained today.

## 2023-04-13 NOTE — Assessment & Plan Note (Signed)
Chronic, ongoing.  Continue collaboration with nephrology and current medication regimen. Recent labs performed by them and stable.

## 2023-04-13 NOTE — Progress Notes (Signed)
BP 138/68 (BP Location: Left Arm, Patient Position: Sitting, Cuff Size: Large)   Pulse 66   Temp 98.7 F (37.1 C) (Oral)   Ht 5' (1.524 m)   Wt 204 lb 6.4 oz (92.7 kg)   SpO2 92%   BMI 39.92 kg/m    Subjective:    Patient ID: Cindy [name], female    DOB: 1951/01/28, 72 y.o.   MRN: [id number]  HPI: Cindy [name] is a 72 y.o. female  Chief Complaint  Patient presents with   Atrial Fibrillation   Depression   Diabetes   Hyperlipidemia   Hypertension   DIABETES August 2024 A1c was 5.9%  Continues on Jardiance and Ozempic.  Collaborates with [facility] for cost assist with Jardiance, Eliquis, and Ozempic -- obtains through programs.  Has lost 7 more pounds since last visit making total loss 18 pounds. Hypoglycemic episodes:no Polydipsia/polyuria: no Visual disturbance: no Chest pain: no Paresthesias: no Glucose Monitoring: yes             Accucheck frequency: Daily             Fasting glucose: 94 to 115             Post prandial:             Evening:             Before meals: Taking Insulin?: no             Long acting insulin:             Short acting insulin: Blood Pressure Monitoring: a few times a week Retinal Examination: Up To Date Foot Exam: Up to Date  Pneumovax: Up To Date  Influenza: Not up to Date - refuses Aspirin: no    HYPERTENSION / HYPERLIPIDEMIA/A-FIB/HF Followed by cardiology, last 04/06/23 when they changed her Bisoprolol to Metoprolol and she is to return in 4 weeks + is to have another echo and wear heart monitor.  Also follows with pulmonary with last visit on 10/18/22 -- using CPAP. Was seen in ER on on 02/24/23 for SOB and diagnosed with pericardial effusion.  Had been out of Jardiance for 3 weeks.  She reports still having some SOB with walking, but is improved from prior to ER visit. Dr. Nedra Hai wants kidney function on labs today due to increasing Lasix to BID. They suspect she has a component of HfpEF.  Has not taken Diltiazem in months because  pharmacy could not get 360 MG.  Continues on Eliquis (gets via assistance), Atorvastatin, Lasix, Olmesartan, Metoprolol, Spironolactone, and Minoxidil.  Echo on 03/15/23 noted LVEF >70% and moderate pericardial effusion. Satisfied with current treatment? yes Duration of hypertension: chronic BP monitoring frequency: once a week BP range: 139/66 to 154/66 BP medication side effects: no Duration of hyperlipidemia: chronic Cholesterol medication side effects: no Cholesterol supplements: none Medication compliance: good compliance Aspirin: no Recent stressors: no Recurrent headaches: no Visual changes: no Palpitations: occasional flutters Dyspnea: ongoing with activity, but improved from before ER visit Chest pain: no Lower extremity edema: improved with increase in Lasix Dizzy/lightheaded: occasional if gets up to fast and sometimes while walking.  CHRONIC KIDNEY DISEASE (CKD 3b) Last saw nephrology on 12/22/22 and has visit again today CKD status: controlled Medications renally dose: yes Previous renal evaluation: no Pneumovax:  Up To Date Influenza Vaccine:  Not up to Date --refuses    Latest Ref Rng & Units 02/22/2023   11:46 AM 04/05/2022  9:06 AM 03/23/2022   10:08 AM  BMP  Glucose 70 - 99 mg/dL 161  096  045   BUN 8 - 27 mg/dL 7  20  15    Creatinine 0.57 - 1.00 mg/dL 4.09  8.11  9.14   BUN/Creat Ratio 12 - 28 6   10    Sodium 134 - 144 mmol/L 144  143  143   Potassium 3.5 - 5.2 mmol/L 3.5  3.6  3.6   Chloride 96 - 106 mmol/L 104  106  102   CO2 20 - 29 mmol/L 27  29  23    Calcium 8.7 - 10.3 mg/dL 9.3  9.5  9.6      DEPRESSION Continues on Duloxetine and Buspar daily.   Mood status: stable Satisfied with current treatment?: yes Symptom severity: moderate  Duration of current treatment : chronic Side effects: no Medication compliance: good compliance Psychotherapy/counseling: none Depressed mood: occasional Anxious mood: occasional Anhedonia: no Significant  weight loss or gain: no Insomnia: none Fatigue: no Feelings of worthlessness or guilt: no Impaired concentration/indecisiveness: no Suicidal ideations: no Hopelessness: no Crying spells: no    04/13/2023   10:21 AM 02/24/2023   10:40 AM 02/22/2023   11:04 AM 02/01/2023    3:17 PM 01/05/2023    9:49 AM  Depression screen PHQ 2/9  Decreased Interest 0 0 0 0 0  Down, Depressed, Hopeless 0 0 0 1 0  PHQ - 2 Score 0 0 0 1 0  Altered sleeping 1 1 1  0 2  Tired, decreased energy 1 1 1  0 1  Change in appetite 0 0 0 0 0  Feeling bad or failure about yourself  0 0 0 0 0  Trouble concentrating 0 0 0 0 0  Moving slowly or fidgety/restless 0 0 0 0 0  Suicidal thoughts 0 0 0 0 0  PHQ-9 Score 2 2 2 1 3   Difficult doing work/chores Not difficult at all Not difficult at all Not difficult at all Not difficult at all Not difficult at all       04/13/2023   10:21 AM 02/24/2023   10:41 AM 02/22/2023   11:05 AM 01/05/2023    9:50 AM  GAD 7 : Generalized Anxiety Score  Nervous, Anxious, on Edge 1 1 1 2   Control/stop worrying 0 0 0 1  Worry too much - different things 1 1 1 1   Trouble relaxing 0 0 0 1  Restless 0 0 0 0  Easily annoyed or irritable 0 0 1 0  Afraid - awful might happen 0 0 0 0  Total GAD 7 Score 2 2 3 5   Anxiety Difficulty Not difficult at all Not difficult at all Not difficult at all Not difficult at all   Relevant past medical, surgical, family and social history reviewed and updated as indicated. Interim medical history since our last visit reviewed. Allergies and medications reviewed and updated.  Review of Systems  Constitutional:  Negative for activity change, appetite change, diaphoresis, fatigue and fever.  Respiratory:  Positive for shortness of breath (occasional). Negative for cough, chest tightness and wheezing.   Cardiovascular:  Negative for chest pain, palpitations and leg swelling.  Gastrointestinal: Negative.   Endocrine: Negative for cold intolerance, heat  intolerance, polydipsia, polyphagia and polyuria.  Neurological: Negative.   Psychiatric/Behavioral: Negative.     Per HPI unless specifically indicated above     Objective:    BP 138/68 (BP Location: Left Arm, Patient Position: Sitting, Cuff Size: Large)  Pulse 66   Temp 98.7 F (37.1 C) (Oral)   Ht 5' (1.524 m)   Wt 204 lb 6.4 oz (92.7 kg)   SpO2 92%   BMI 39.92 kg/m   Wt Readings from Last 3 Encounters:  04/13/23 204 lb 6.4 oz (92.7 kg)  02/24/23 211 lb 12.8 oz (96.1 kg)  02/22/23 212 lb 6.4 oz (96.3 kg)    Physical Exam Vitals and nursing note reviewed.  Constitutional:      General: She is awake. She is not in acute distress.    Appearance: Normal appearance. She is well-developed and well-groomed. She is obese. She is not ill-appearing or toxic-appearing.  HENT:     Head: Normocephalic.     Right Ear: Hearing and external ear normal.     Left Ear: Hearing and external ear normal.  Eyes:     General: Lids are normal.        Right eye: No discharge.        Left eye: No discharge.     Conjunctiva/sclera: Conjunctivae normal.     Pupils: Pupils are equal, round, and reactive to light.  Neck:     Thyroid: No thyromegaly.     Vascular: No carotid bruit.  Cardiovascular:     Rate and Rhythm: Normal rate and regular rhythm.     Heart sounds: Murmur heard.     Systolic murmur is present with a grade of 2/6.     No gallop.     Comments: Bilateral legs and ankles with no edema and good hair pattern present. Pulmonary:     Effort: Pulmonary effort is normal. No accessory muscle usage or respiratory distress.     Breath sounds: Normal breath sounds.  Abdominal:     General: Bowel sounds are normal. There is no distension.     Palpations: Abdomen is soft.     Tenderness: There is no abdominal tenderness.  Musculoskeletal:     Cervical back: Normal range of motion and neck supple.     Right lower leg: No edema.     Left lower leg: No edema.  Lymphadenopathy:      Cervical: No cervical adenopathy.  Skin:    General: Skin is warm and dry.  Neurological:     Mental Status: She is alert and oriented to person, place, and time.     Deep Tendon Reflexes: Reflexes are normal and symmetric.     Reflex Scores:      Brachioradialis reflexes are 2+ on the right side and 2+ on the left side.      Patellar reflexes are 2+ on the right side and 2+ on the left side. Psychiatric:        Attention and Perception: Attention normal.        Mood and Affect: Mood normal.        Speech: Speech normal.        Behavior: Behavior normal. Behavior is cooperative.        Thought Content: Thought content normal.    Results for orders placed or performed in visit on 02/22/23  Novel Coronavirus, NAA ([facility])   Specimen: Nasopharyngeal(NP) swabs in vial transport medium  Result Value Ref Range   SARS-CoV-2, NAA Comment Not Detected  Renal Function Panel  Result Value Ref Range   Glucose 101 (H) 70 - 99 mg/dL   BUN 7 (L) 8 - 27 mg/dL   Creatinine, Ser 3.66 (H) 0.57 - 1.00 mg/dL   eGFR 51 (L) >44  mL/min/1.73   BUN/Creatinine Ratio 6 (L) 12 - 28   Sodium 144 134 - 144 mmol/L   Potassium 3.5 3.5 - 5.2 mmol/L   Chloride 104 96 - 106 mmol/L   CO2 27 20 - 29 mmol/L   Calcium 9.3 8.7 - 10.3 mg/dL   Phosphorus 3.3 3.0 - 4.3 mg/dL   Albumin 4.4 3.8 - 4.8 g/dL  B Nat Peptide  Result Value Ref Range   BNP 95.4 0.0 - 100.0 pg/mL  Veritor Flu A/B Waived  Result Value Ref Range   Influenza A Negative Negative   Influenza B Negative Negative      Assessment & Plan:   Problem List Items Addressed This Visit       Cardiovascular and Mediastinum   Hypertension associated with diabetes (HCC)    Chronic, ongoing with SBP slightly elevated on checks today.  At this time will add on low dose of Amlodipine 2.5 MG since has not taken Diltiazem in some time.  Maintain remainder of regimen.  Will reach out to Dr. Nedra Hai to alert them of change, as adding Amlodipine was discussed in  their note.  Continue collaboration with cardiology.  Recommend she monitor BP at least a few mornings a week at home and document.  DASH diet at home.  Labs: BMP.         Relevant Medications   metoprolol succinate (TOPROL-XL) 50 MG 24 hr tablet   amLODipine (NORVASC) 2.5 MG tablet   olmesartan (BENICAR) 40 MG tablet   minoxidil (LONITEN) 10 MG tablet   Other Relevant Orders   Bayer DCA Hb A1c Waived   Basic metabolic panel   Paroxysmal atrial fibrillation (HCC)    Chronic, ongoing, followed by cardiology.  Continue Eliquis and Metoprolol + collaboration with cardiology team.  Rate controlled.  Continue to collaborate with CCM team.  Recent notes reviewed.        Relevant Medications   metoprolol succinate (TOPROL-XL) 50 MG 24 hr tablet   amLODipine (NORVASC) 2.5 MG tablet   olmesartan (BENICAR) 40 MG tablet   minoxidil (LONITEN) 10 MG tablet   Other Relevant Orders   Basic metabolic panel   Pericardial effusion    Followed by cardiology at [facility] at this time and plan for repeat echo next week.  Will continue this collaboration.  Recent notes and labs reviewed.      Relevant Medications   metoprolol succinate (TOPROL-XL) 50 MG 24 hr tablet   amLODipine (NORVASC) 2.5 MG tablet   olmesartan (BENICAR) 40 MG tablet   minoxidil (LONITEN) 10 MG tablet     Respiratory   OSA (obstructive sleep apnea)    Chronic, ongoing.  Continue 100% adherence to CPAP use at night.  Recent note from Dr. Welton Flakes reviewed.        Endocrine   CKD stage 4 due to type 2 diabetes mellitus (HCC)    Chronic, ongoing CKD 3b.  At this time reduce and renal dose medications as needed + continue collaboration with nephrology.  Labs: A1c and BMP.  Avoid NSAIDs.  Refer to diabetes with obesity plan of care for further.  A1c stable at 5.6% today.      Relevant Medications   olmesartan (BENICAR) 40 MG tablet   Other Relevant Orders   Basic metabolic panel   Hyperlipidemia associated with type 2 diabetes  mellitus (HCC)    Chronic, ongoing.  Continue current medication regimen and adjust as needed.  Lipid panel obtained today.  Relevant Medications   metoprolol succinate (TOPROL-XL) 50 MG 24 hr tablet   amLODipine (NORVASC) 2.5 MG tablet   olmesartan (BENICAR) 40 MG tablet   minoxidil (LONITEN) 10 MG tablet   Other Relevant Orders   Bayer DCA Hb A1c Waived   Lipid Panel w/o Chol/HDL Ratio   Primary hyperparathyroidism (HCC)    Chronic, ongoing.  Continue collaboration with nephrology and current medication regimen. Recent labs performed by them and stable.      Relevant Orders   Basic metabolic panel   Type 2 diabetes mellitus with morbid obesity (HCC) - Primary    Chronic, ongoing with A1c 5.6% today, downward trend from 5.9% and urine ALB 80 January 2024.  Praised for ongoing success.  Discussed that she is in normal A1c range which helps to reduce long term poor outcomes. - Continue Jardiance 25 MG and Ozempic 0.5 MG weekly at this time, as seeing significant benefit with this.  Gets via assistance. - Maintain off Glimepiride due to her age and risk for hypoglycemia. Plus maintain off Metformin due to kidney health. - If poor control once at MAX doses of GLP1 and SGLT2, then will consider referral to endo, but at this time will continue to work on this in office which patient agrees with. - Check BS TID and focus on diabetic diet.    - Eye and foot exams up to date. - Refuses flu vaccine. - Return in 3 months for A1c -- but alert provider prior to if elevations in BS >300 or <70.        Relevant Medications   olmesartan (BENICAR) 40 MG tablet   Other Relevant Orders   Bayer DCA Hb A1c Waived     Genitourinary   Anemia in chronic kidney disease (CKD)    Chronic, ongoing.  Follows with nephrology.  Continue this collaboration, recent notes and labs reviewed. BMP today.          Hematopoietic and Hemostatic   Other thrombophilia (HCC)    Related to a-fib and long term  Eliquis.  Monitor CBC regularly and monitor skin for bruising or breakdown, notify provider if present.        Other   Depression, major, single episode, moderate (HCC)    Chronic, stable.   Denies SI/HI.  Continue Duloxetine at this time, which also offers benefit to chronic pain and mood, + continue Buspar which has offered her benefit to anxiety.  Refills as needed.  Will adjust dosing or add medication as needed.        Relevant Medications   busPIRone (BUSPAR) 5 MG tablet   Morbid obesity (HCC)    BMI 39.92 with T2DM, HTN, CKD with ongoing weight loss with Ozempic.  Recommended eating smaller high protein, low fat meals more frequently and exercising 30 mins a day 5 times a week with a goal of 10-15lb weight loss in the next 3 months. Patient voiced their understanding and motivation to adhere to these recommendations.         Follow up plan: Return in about 5 weeks (around 05/18/2023) for HTN -- added Amlodipine.

## 2023-04-13 NOTE — Assessment & Plan Note (Signed)
Chronic, ongoing with A1c 5.6% today, downward trend from 5.9% and urine ALB 80 January 2024.  Praised for ongoing success.  Discussed that she is in normal A1c range which helps to reduce long term poor outcomes. - Continue Jardiance 25 MG and Ozempic 0.5 MG weekly at this time, as seeing significant benefit with this.  Gets via assistance. - Maintain off Glimepiride due to her age and risk for hypoglycemia. Plus maintain off Metformin due to kidney health. - If poor control once at MAX doses of GLP1 and SGLT2, then will consider referral to endo, but at this time will continue to work on this in office which patient agrees with. - Check BS TID and focus on diabetic diet.    - Eye and foot exams up to date. - Refuses flu vaccine. - Return in 3 months for A1c -- but alert provider prior to if elevations in BS >300 or <70.

## 2023-04-13 NOTE — Assessment & Plan Note (Signed)
Chronic, ongoing.  Continue 100% adherence to CPAP use at night.  Recent note from Dr. Welton Flakes reviewed.

## 2023-04-13 NOTE — Assessment & Plan Note (Signed)
Related to a-fib and long term Eliquis.  Monitor CBC regularly and monitor skin for bruising or breakdown, notify provider if present. 

## 2023-04-13 NOTE — Assessment & Plan Note (Signed)
Followed by cardiology at [facility] at this time and plan for repeat echo next week.  Will continue this collaboration.  Recent notes and labs reviewed.

## 2023-04-14 ENCOUNTER — Ambulatory Visit: Payer: Self-pay

## 2023-04-14 ENCOUNTER — Other Ambulatory Visit: Payer: Self-pay | Admitting: Nurse Practitioner

## 2023-04-14 ENCOUNTER — Telehealth: Payer: Self-pay | Admitting: Nurse Practitioner

## 2023-04-14 DIAGNOSIS — E876 Hypokalemia: Secondary | ICD-10-CM

## 2023-04-14 LAB — LIPID PANEL W/O CHOL/HDL RATIO
Cholesterol, Total: 161 mg/dL (ref 100–199)
HDL: 54 mg/dL (ref >39)
LDL Chol Calc (NIH): 88 mg/dL (ref 0–99)
Triglycerides: 107 mg/dL (ref 0–149)
VLDL Cholesterol Cal: 19 mg/dL (ref 5–40)

## 2023-04-14 LAB — BASIC METABOLIC PANEL
BUN/Creatinine Ratio: 10 — ABNORMAL LOW (ref 12–28)
BUN: 12 mg/dL (ref 8–27)
CO2: 27 mmol/L (ref 20–29)
Calcium: 9.8 mg/dL (ref 8.7–10.3)
Chloride: 102 mmol/L (ref 96–106)
Creatinine, Ser: 1.21 mg/dL — ABNORMAL HIGH (ref 0.57–1.00)
Glucose: 100 mg/dL — ABNORMAL HIGH (ref 70–99)
Potassium: 3.4 mmol/L — ABNORMAL LOW (ref 3.5–5.2)
Sodium: 147 mmol/L — ABNORMAL HIGH (ref 134–144)
eGFR: 47 mL/min/{1.73_m2} — ABNORMAL LOW (ref >59)

## 2023-04-14 MED ORDER — ATORVASTATIN CALCIUM 80 MG PO TABS: 80.0000 mg | ORAL_TABLET | Freq: Every day | ORAL | 4 refills | Status: DC

## 2023-04-14 NOTE — Telephone Encounter (Signed)
OPENED IN ERROR

## 2023-04-14 NOTE — Progress Notes (Signed)
Good morning, please let Cindy [name] know her labs have returned + needs a lab only visit in 2 weeks: - Kidney function continues to show stage 3a kidney disease with no worsening. - Sodium, salt, level is a little elevated.  Try to cut back on salt in diet and add a little more water. - Potassium level a little low, mild, at present we can try diet -- add some potassium rich foods to diet like bananas, mangoes, dried fruit, potatoes.  We will recheck in two weeks outpatient.  My staff will schedule this lab visit only with you today. - Lipid panel shows LDL a little above goal.  I am going to increase your cholesterol medication, Atorvastatin, to 80 MG.  Stop the 40 MG dosing.  We will recheck levels next visit.  Any questions? Keep being amazing!!  Thank you for allowing me to participate in your care.  I appreciate you. Kindest regards, Terie Lear

## 2023-04-14 NOTE — Telephone Encounter (Signed)
Chief Complaint: Medication Question  Symptoms: feet and leg swelling  Disposition: [] ED /[] Urgent Care (no appt availability in office) / [] Appointment(In office/virtual)/ []  [place] [facility]/ [x] Home Care/ [] Refused Recommended Disposition /[] [place] [facility]/ []  Follow-up with PCP Additional Notes: Patient wants to know when her Spironolactone 25 MG medication directions were changed from 1 tablet daily to 2 tablets daily? Patient was advised it was changed on 03/29/23 and approved by Dr. Evelene Croon. Patient verbalized understanding wanted to know if PCP was aware of the change and agreed with the change made. Advised I would forward to PCP to confirm.    Summary: Medication Question + feet / legs swelling * (PCP is aware)*   Patient has called in reference to a medication question she has about medication spironolactone (ALDACTONE) 25 MG tablet.  Patient states she does not remember Aura Dials, NP, PCP, changing or upping her dosage of this medication. Patient states her newest bottle says to take 2 tablets daily of the 25 MG medication (which would be 50 MG daily) but states her previous bottle said to take 1 tablet daily of the 25 MG daily and patient would like a call back for clarification.   Patient states her feet and legs are swelling some, she saw PCP yesterday 11.13.2024 and she is aware of this.   Patients callback # [phone]     Reason for Disposition  Caller has medicine question only, adult not sick, AND triager answers question  Answer Assessment - Initial Assessment Questions 1. NAME of MEDICINE: "What medicine(s) are you calling about?"     Spironolactone 25 MG  2. QUESTION: "What is your question?" (e.g., double dose of medicine, side effect)     When was my medicine change to 2 tablets daily? I use to take 1 tablet a day. 3. PRESCRIBER: "Who prescribed the medicine?" Reason: if prescribed by specialist, call should be referred to that group.      Jolene, NP 4. SYMPTOMS: "Do you have any symptoms?" If Yes, ask: "What symptoms are you having?"  "How bad are the symptoms (e.g., mild, moderate, severe)     Feet and leg swelling  Protocols used: Medication Question Call-A-AH

## 2023-04-15 ENCOUNTER — Ambulatory Visit: Payer: Self-pay | Admitting: *Deleted

## 2023-04-15 NOTE — Patient Outreach (Signed)
  Care Coordination   04/15/2023 Name: Cindy [name] MRN: [id number] DOB: 12-27-1950   Care Coordination Outreach Attempts:  An unsuccessful telephone outreach was attempted for a scheduled appointment today.  Follow Up Plan:  Additional outreach attempts will be made to offer the patient care coordination information and services.   Encounter Outcome:  No Answer   Care Coordination Interventions:  No, not indicated    Rodney Langton, RN, MSN, CCM [place]  [facility] RN Care Coordinator Direct Dial: [phone] / Main [phone] Fax [phone] Email: [email or web address]: [email or web address]

## 2023-04-15 NOTE — Telephone Encounter (Signed)
Called and LVM asking for patient to please return my call.   OK for PEC to give patient providers message regarding medication if she calls back.

## 2023-04-15 NOTE — Progress Notes (Unsigned)
   04/15/2023  Patient ID: Cindy [name], female   DOB: 1950/06/12, 72 y.o.   MRN: [id number]  Patient outreach to address PAP renewals for 2025  -Patient is currently enrolled in [employer] PAP for Jardiance 25mg  daily, [employer] PAP for Ozempic 0.5mg  weekly, and BMS PAP for Eliquis 5mg  BID -Ozempic and Jardiance are prescribed by PCP, and Eliquis by [facility] cardiology (Dr. [place] Lee) -All PAP programs are due for renewal for 2025 -Submitted [employer] PAP renewal application online during telephone visit -Scheduled in person appointment for 11/20 to complete Eliquis and Jardiance renewal applications with patient  [name], PharmD, DPLA

## 2023-04-18 NOTE — Progress Notes (Signed)
Discussed with results with patient per below from provider. Patient is aware of the new rx with increased dose and aware she needs to begin taking this. 05/02/2023 lab only visit scheduled.    Cannady, Jolene T, NP  P [facility] Clinical Cc: P Cfp Admin Good morning, please let Cindy [name] know her labs have returned + needs a lab only visit in 2 weeks: - Kidney function continues to show stage 3a kidney disease with no worsening. - Sodium, salt, level is a little elevated.  Try to cut back on salt in diet and add a little more water. - Potassium level a little low, mild, at present we can try diet -- add some potassium rich foods to diet like bananas, mangoes, dried fruit, potatoes.  We will recheck in two weeks outpatient.  My staff will schedule this lab visit only with you today. - Lipid panel shows LDL a little above goal.  I am going to increase your cholesterol medication, Atorvastatin, to 80 MG.  Stop the 40 MG dosing.  We will recheck levels next visit.  Any questions? Keep being amazing!!  Thank you for allowing me to participate in your care.  I appreciate you. Kindest regards, Jolene

## 2023-04-20 ENCOUNTER — Other Ambulatory Visit: Payer: Self-pay

## 2023-04-20 DIAGNOSIS — I501 Left ventricular failure: Secondary | ICD-10-CM | POA: Diagnosis not present

## 2023-04-20 DIAGNOSIS — I3139 Other pericardial effusion (noninflammatory): Secondary | ICD-10-CM | POA: Diagnosis not present

## 2023-04-20 DIAGNOSIS — I517 Cardiomegaly: Secondary | ICD-10-CM | POA: Diagnosis not present

## 2023-04-20 DIAGNOSIS — I351 Nonrheumatic aortic (valve) insufficiency: Secondary | ICD-10-CM | POA: Diagnosis not present

## 2023-04-20 NOTE — Progress Notes (Unsigned)
   04/20/2023  Patient ID: [name], female   DOB: 1951-04-03, 72 y.o.   MRN: [id number]  Patient presenting to [facility] for assistance in completing 2025 PAP renewal applications for Eliquis and Jardiance.  Patient portion of Jardiance application has been completed-faxing to PCP to sign off on provider portion and fax to [facility].  Upon further review of Eliquis, patient will not qualify for 2025 assistance until 3% of income has been spent out of pocket on medications.  It is likely that she would qualify for Medicare Extra Help, so I am going to contact her to see if we can complete application for this over the phone.  [name], PharmD, DPLA

## 2023-04-22 ENCOUNTER — Telehealth: Payer: Self-pay

## 2023-04-22 NOTE — Progress Notes (Signed)
   04/22/2023  Patient ID: Cindy [name], female   DOB: Nov 15, 1950, 72 y.o.   MRN: [id number]  Contacted [employer] PAP to check on processing status of patient's 2025 re-enrollment, because I received notice the application was missing information.  [employer] stating application is missing provider signature; but upon further review, they were able to see electronic signature and push through for insurance verification step.  This usually takes another 24-48 hours.  I will follow-up again next week.  [name], PharmD, DPLA

## 2023-04-22 NOTE — Progress Notes (Signed)
   04/22/2023  Patient ID: Cindy [name], female   DOB: [birth year], 72 y.o.   MRN: [id number]  Patient outreach to schedule telephone visit to complete [employer] Medicare Extra Help application since patient will not yet qualify for Eliquis PAP renewal based on OOP expenditure requirement.  Appointment scheduled for 11/26 at 130pm.  [name], PharmD, DPLA

## 2023-04-26 ENCOUNTER — Other Ambulatory Visit: Payer: Self-pay

## 2023-04-26 NOTE — Progress Notes (Signed)
   04/26/2023  Patient ID: [name], female   DOB: Feb 23, 1951, 72 y.o.   MRN: [id number]  Patient outreach to complete [employer] Medicare Extra Help application online since patient will not qualify for Eliquis PAP for 2025 until 3% of HHI is spent out of pocket on medications.  It is also likely Jardiance PAP renewal application will be denied until proof of applying to this program is provided.  Application complete with patient on the phone to provide question answers.    Patient also states she received communication from [employer] that provider information was missing on her 2025 renewal application for Ozempic.  She is also down to a 1 month supply of Jardiance 25mg  and will run out before EOY.  Contacting [employer] to provide missing information for renewal application and [employer] to request another refill of Jardiance before program enrollment for this year ends.  [name], PharmD, DPLA

## 2023-04-27 DIAGNOSIS — N1832 Chronic kidney disease, stage 3b: Secondary | ICD-10-CM | POA: Diagnosis not present

## 2023-04-27 DIAGNOSIS — I10 Essential (primary) hypertension: Secondary | ICD-10-CM | POA: Diagnosis not present

## 2023-04-27 DIAGNOSIS — D631 Anemia in chronic kidney disease: Secondary | ICD-10-CM | POA: Diagnosis not present

## 2023-04-27 DIAGNOSIS — N2581 Secondary hyperparathyroidism of renal origin: Secondary | ICD-10-CM | POA: Diagnosis not present

## 2023-04-27 DIAGNOSIS — E1122 Type 2 diabetes mellitus with diabetic chronic kidney disease: Secondary | ICD-10-CM | POA: Diagnosis not present

## 2023-04-29 NOTE — Progress Notes (Addendum)
   04/29/2023  Patient ID: Cindy [name], female   DOB: 20-Nov-1950, 72 y.o.   MRN: [id number]  Contacted [employer] to check on patient's 2025 renewal application, and the program states patient information is missing.  Benefit verification was able to pull patient's insurance information, but it did not save to the application.  This has been completed, and application currently in benefit verification stage, which usually takes 2-3 business days to complete processing.  [name], PharmD, DPLA

## 2023-05-02 ENCOUNTER — Other Ambulatory Visit: Payer: Medicare HMO

## 2023-05-02 DIAGNOSIS — E876 Hypokalemia: Secondary | ICD-10-CM

## 2023-05-02 NOTE — Progress Notes (Signed)
   05/02/2023  Patient ID: Cindy [name], female   DOB: [birth year], 72 y.o.   MRN: [id number]  Contacted [employer] to check on 2025 re-enrollment for Jardiance 25mg .  The company states they have not received this renewal application yet; however, it was faxed by [facility] 11/22.  Following up to see if office still has document available to be able to re-fax.  [name] had also inquired about ability to get another refill this year; and the program has placed a 3 month supply into process and ship out to her.  I will verify this is received at our follow-up visit 12/16.  [name], PharmD, DPLA

## 2023-05-03 LAB — BASIC METABOLIC PANEL
BUN/Creatinine Ratio: 14 (ref 12–28)
BUN: 18 mg/dL (ref 8–27)
CO2: 26 mmol/L (ref 20–29)
Calcium: 9.5 mg/dL (ref 8.7–10.3)
Chloride: 102 mmol/L (ref 96–106)
Creatinine, Ser: 1.31 mg/dL — ABNORMAL HIGH (ref 0.57–1.00)
Glucose: 109 mg/dL — ABNORMAL HIGH (ref 70–99)
Potassium: 3.7 mmol/L (ref 3.5–5.2)
Sodium: 145 mmol/L — ABNORMAL HIGH (ref 134–144)
eGFR: 43 mL/min/{1.73_m2} — ABNORMAL LOW (ref >59)

## 2023-05-03 NOTE — Progress Notes (Signed)
Good afternoon, please let Preesha know her labs have returned and her sodium level remains a little elevated but is coming down.  Continue to focus on low sodium in diet and add a little more water.  We will recheck at next visit.  Any questions? Keep being amazing!!  Thank you for allowing me to participate in your care.  I appreciate you. Kindest regards, Adison Jerger

## 2023-05-04 DIAGNOSIS — R931 Abnormal findings on diagnostic imaging of heart and coronary circulation: Secondary | ICD-10-CM | POA: Diagnosis not present

## 2023-05-04 DIAGNOSIS — I4729 Other ventricular tachycardia: Secondary | ICD-10-CM | POA: Diagnosis not present

## 2023-05-04 DIAGNOSIS — N1832 Chronic kidney disease, stage 3b: Secondary | ICD-10-CM | POA: Diagnosis not present

## 2023-05-04 DIAGNOSIS — I3139 Other pericardial effusion (noninflammatory): Secondary | ICD-10-CM | POA: Diagnosis not present

## 2023-05-04 DIAGNOSIS — I10 Essential (primary) hypertension: Secondary | ICD-10-CM | POA: Diagnosis not present

## 2023-05-04 DIAGNOSIS — R011 Cardiac murmur, unspecified: Secondary | ICD-10-CM | POA: Diagnosis not present

## 2023-05-04 DIAGNOSIS — I471 Supraventricular tachycardia, unspecified: Secondary | ICD-10-CM | POA: Diagnosis not present

## 2023-05-04 DIAGNOSIS — I4891 Unspecified atrial fibrillation: Secondary | ICD-10-CM | POA: Diagnosis not present

## 2023-05-04 DIAGNOSIS — R9431 Abnormal electrocardiogram [ECG] [EKG]: Secondary | ICD-10-CM | POA: Diagnosis not present

## 2023-05-06 NOTE — Progress Notes (Unsigned)
   05/06/2023  Patient ID: Cindy [name], female   DOB: [birth year], 72 y.o.   MRN: [id number]  Contacted [employer] to check on 2025 re-enrollment for Ozempic, and patient has been approved through 05/30/24.  I will notify her during out scheduled telephone follow-up next week.  [employer] ID #[id number].  [name], PharmD, DPLA

## 2023-05-09 ENCOUNTER — Telehealth: Payer: Self-pay | Admitting: Nurse Practitioner

## 2023-05-09 NOTE — Telephone Encounter (Signed)
Pt is requesting a callback from [facility] A. Littie Deeds, pharmacist. Stated she wanted to let her know she received the paper that they were waiting for.Stated received denial notice from paperwork she helped her fill out.  Mentioned concerns on the assistance she receives for medication empagliflozin (JARDIANCE) 25 MG TABS tablet stated she has seven days left.  Please advise.

## 2023-05-10 NOTE — Progress Notes (Signed)
   05/10/2023  Patient ID: Cindy [name], female   DOB: Oct 22, 1950, 72 y.o.   MRN: [id number]  Clinic routed request from [facility] stating Ms. Mancinas called to speak with me.  Returned patient's call, and she has received a denial letter from [employer] for the Medicare Extra Help Program.  She will bring this to [facility] 12/18 while I am here next, so I can make copies to provide to the Jardiance and Eiquis patient assistance programs.  This should lead to approval for Jardiance renewal; but she will still need to reach the 3% OOP expenditure on medication costs for 2025 to be approved for Eliquis PAP.  Patient stated understanding, and I will check in with her in March to see where we are with this.  [name], PharmD, DPLA

## 2023-05-11 NOTE — Progress Notes (Addendum)
   05/11/2023  Patient ID: Cindy [name], female   DOB: 25-Jul-1950, 72 y.o.   MRN: [id number]  Contacted [employer] to check on refill status for Jardiance 25mg , and there was a delay in the shipment of this medication; but they cannot explain why.  They state the order will go out today but may take 7 days to arrive.  Program also states patient has been approved through 05/30/24, so we do not need to send in proof of [employer] denial at this time.    Calling patient to make her aware of the above information.  [name], PharmD, DPLA

## 2023-05-14 NOTE — Patient Instructions (Signed)
Be Involved in Caring For Your Health:  Taking Medications When medications are taken as directed, they can greatly improve your health. But if they are not taken as prescribed, they may not work. In some cases, not taking them correctly can be harmful. To help ensure your treatment remains effective and safe, understand your medications and how to take them. Bring your medications to each visit for review by your provider.  Your lab results, notes, and after visit summary will be available on My Chart. We strongly encourage you to use this feature. If lab results are abnormal the clinic will contact you with the appropriate steps. If the clinic does not contact you assume the results are satisfactory. You can always view your results on My Chart. If you have questions regarding your health or results, please contact the clinic during office hours. You can also ask questions on My Chart.  We at [facility] are grateful that you chose us to provide your care. We strive to provide evidence-based and compassionate care and are always looking for feedback. If you get a survey from the clinic please complete this so we can hear your opinions.  DASH Eating Plan DASH stands for Dietary Approaches to Stop Hypertension. The DASH eating plan is a healthy eating plan that has been shown to: Lower high blood pressure (hypertension). Reduce your risk for type 2 diabetes, heart disease, and stroke. Help with weight loss. What are tips for following this plan? Reading food labels Check food labels for the amount of salt (sodium) per serving. Choose foods with less than 5 percent of the Daily Value (DV) of sodium. In general, foods with less than 300 milligrams (mg) of sodium per serving fit into this eating plan. To find whole grains, look for the word "whole" as the first word in the ingredient list. Shopping Buy products labeled as "low-sodium" or "no salt added." Buy fresh foods. Avoid canned  foods and pre-made or frozen meals. Cooking Try not to add salt when you cook. Use salt-free seasonings or herbs instead of table salt or sea salt. Check with your health care provider or pharmacist before using salt substitutes. Do not fry foods. Cook foods in healthy ways, such as baking, boiling, grilling, roasting, or broiling. Cook using oils that are good for your heart. These include olive, canola, avocado, soybean, and sunflower oil. Meal planning  Eat a balanced diet. This should include: 4 or more servings of fruits and 4 or more servings of vegetables each day. Try to fill half of your plate with fruits and vegetables. 6-8 servings of whole grains each day. 6 or less servings of lean meat, poultry, or fish each day. 1 oz is 1 serving. A 3 oz (85 g) serving of meat is about the same size as the palm of your hand. One egg is 1 oz (28 g). 2-3 servings of low-fat dairy each day. One serving is 1 cup (237 mL). 1 serving of nuts, seeds, or beans 5 times each week. 2-3 servings of heart-healthy fats. Healthy fats called omega-3 fatty acids are found in foods such as walnuts, flaxseeds, fortified milks, and eggs. These fats are also found in cold-water fish, such as sardines, salmon, and mackerel. Limit how much you eat of: Canned or prepackaged foods. Food that is high in trans fat, such as fried foods. Food that is high in saturated fat, such as fatty meat. Desserts and other sweets, sugary drinks, and other foods with added sugar. Full-fat   dairy products. Do not salt foods before eating. Do not eat more than 4 egg yolks a week. Try to eat at least 2 vegetarian meals a week. Eat more home-cooked food and less restaurant, buffet, and fast food. Lifestyle When eating at a restaurant, ask if your food can be made with less salt or no salt. If you drink alcohol: Limit how much you have to: 0-1 drink a day if you are female. 0-2 drinks a day if you are female. Know how much alcohol is in  your drink. In the [place] one drink is one 12 oz bottle of beer (355 mL), one 5 oz glass of wine (148 mL), or one 1 oz glass of hard liquor (44 mL). General information Avoid eating more than 2,300 mg of salt a day. If you have hypertension, you may need to reduce your sodium intake to 1,500 mg a day. Work with your provider to stay at a healthy body weight or lose weight. Ask what the best weight range is for you. On most days of the week, get at least 30 minutes of exercise that causes your heart to beat faster. This may include walking, swimming, or biking. Work with your provider or dietitian to adjust your eating plan to meet your specific calorie needs. What foods should I eat? Fruits All fresh, dried, or frozen fruit. Canned fruits that are in their natural juice and do not have sugar added to them. Vegetables Fresh or frozen vegetables that are raw, steamed, roasted, or grilled. Low-sodium or reduced-sodium tomato and vegetable juice. Low-sodium or reduced-sodium tomato sauce and tomato paste. Low-sodium or reduced-sodium canned vegetables. Grains Whole-grain or whole-wheat bread. Whole-grain or whole-wheat pasta. Brown rice. Oatmeal. Quinoa. Bulgur. Whole-grain and low-sodium cereals. Pita bread. Low-fat, low-sodium crackers. Whole-wheat flour tortillas. Meats and other proteins Skinless chicken or turkey. Ground chicken or turkey. Pork with fat trimmed off. Fish and seafood. Egg whites. Dried beans, peas, or lentils. Unsalted nuts, nut butters, and seeds. Unsalted canned beans. Lean cuts of beef with fat trimmed off. Low-sodium, lean precooked or cured meat, such as sausages or meat loaves. Dairy Low-fat (1%) or fat-free (skim) milk. Reduced-fat, low-fat, or fat-free cheeses. Nonfat, low-sodium ricotta or cottage cheese. Low-fat or nonfat yogurt. Low-fat, low-sodium cheese. Fats and oils Soft margarine without trans fats. Vegetable oil. Reduced-fat, low-fat, or light mayonnaise and salad  dressings (reduced-sodium). Canola, safflower, olive, avocado, soybean, and sunflower oils. Avocado. Seasonings and condiments Herbs. Spices. Seasoning mixes without salt. Other foods Unsalted popcorn and pretzels. Fat-free sweets. The items listed above may not be all the foods and drinks you can have. Talk to a dietitian to learn more. What foods should I avoid? Fruits Canned fruit in a light or heavy syrup. Fried fruit. Fruit in cream or butter sauce. Vegetables Creamed or fried vegetables. Vegetables in a cheese sauce. Regular canned vegetables that are not marked as low-sodium or reduced-sodium. Regular canned tomato sauce and paste that are not marked as low-sodium or reduced-sodium. Regular tomato and vegetable juices that are not marked as low-sodium or reduced-sodium. Pickles. Olives. Grains Baked goods made with fat, such as croissants, muffins, or some breads. Dry pasta or rice meal packs. Meats and other proteins Fatty cuts of meat. Ribs. Fried meat. Bacon. Bologna, salami, and other precooked or cured meats, such as sausages or meat loaves, that are not lean and low in sodium. Fat from the back of a pig (fatback). Bratwurst. Salted nuts and seeds. Canned beans with added salt. Canned   or smoked fish. Whole eggs or egg yolks. Chicken or turkey with skin. Dairy Whole or 2% milk, cream, and half-and-half. Whole or full-fat cream cheese. Whole-fat or sweetened yogurt. Full-fat cheese. Nondairy creamers. Whipped toppings. Processed cheese and cheese spreads. Fats and oils Butter. Stick margarine. Lard. Shortening. Ghee. Bacon fat. Tropical oils, such as coconut, palm kernel, or palm oil. Seasonings and condiments Onion salt, garlic salt, seasoned salt, table salt, and sea salt. Worcestershire sauce. Tartar sauce. Barbecue sauce. Teriyaki sauce. Soy sauce, including reduced-sodium soy sauce. Steak sauce. Canned and packaged gravies. Fish sauce. Oyster sauce. Cocktail sauce. Store-bought  horseradish. Ketchup. Mustard. Meat flavorings and tenderizers. Bouillon cubes. Hot sauces. Pre-made or packaged marinades. Pre-made or packaged taco seasonings. Relishes. Regular salad dressings. Other foods Salted popcorn and pretzels. The items listed above may not be all the foods and drinks you should avoid. Talk to a dietitian to learn more. Where to find more information National Heart, Lung, and Blood Institute (NHLBI): nhlbi.nih.gov American Heart Association (AHA): heart.org Academy of Nutrition and Dietetics: eatright.org National Kidney Foundation (NKF): kidney.org This information is not intended to replace advice given to you by your health care provider. Make sure you discuss any questions you have with your health care provider. Document Revised: 06/03/2022 Document Reviewed: 06/03/2022 Elsevier Patient Education  2024 Elsevier Inc.  

## 2023-05-16 ENCOUNTER — Other Ambulatory Visit: Payer: Medicare HMO

## 2023-05-18 ENCOUNTER — Encounter: Payer: Self-pay | Admitting: Nurse Practitioner

## 2023-05-18 ENCOUNTER — Ambulatory Visit: Payer: Medicare HMO | Admitting: Nurse Practitioner

## 2023-05-18 ENCOUNTER — Other Ambulatory Visit: Payer: Self-pay

## 2023-05-18 DIAGNOSIS — I152 Hypertension secondary to endocrine disorders: Secondary | ICD-10-CM

## 2023-05-18 DIAGNOSIS — E1159 Type 2 diabetes mellitus with other circulatory complications: Secondary | ICD-10-CM | POA: Diagnosis not present

## 2023-05-18 DIAGNOSIS — Z7985 Long-term (current) use of injectable non-insulin antidiabetic drugs: Secondary | ICD-10-CM

## 2023-05-18 MED ORDER — AMLODIPINE BESYLATE 2.5 MG PO TABS: 2.5000 mg | ORAL_TABLET | Freq: Every day | ORAL | 4 refills | Status: DC

## 2023-05-18 NOTE — Progress Notes (Signed)
BP 128/67   Pulse 80   Temp 98.4 F (36.9 C) (Oral)   Ht 5' (1.524 m)   Wt 200 lb 6.4 oz (90.9 kg)   SpO2 95%   BMI 39.14 kg/m    Subjective:    Patient ID: Cindy [name], female    DOB: 12/02/1950, 72 y.o.   MRN: [id number]  HPI: Cindy [name] is a 72 y.o. female  Chief Complaint  Patient presents with   Hypertension   HYPERTENSION with Chronic Kidney Disease Follow-up today for BP check.  Added on Amlodipine 2.5 MG on 04/13/23, this was a recommendation on recent cardiology note and PCP made them aware of addition. Saw cardiology. Dr. Nedra Hai, on 05/04/23 with no changes made, she is to continue Amlodipine, Olmesartan, Spironolactone, Lasix, and Metoprolol XL.  Had visit with nephrology 05/27/23, labs remaining stable. Hypertension status: stable  Satisfied with current treatment? yes Duration of hypertension: chronic BP monitoring frequency:  weekly BP range: 110-140/70-80 BP medication side effects:  no Medication compliance: good compliance Aspirin: no Recurrent headaches: no Visual changes: no Palpitations: no Dyspnea: no Chest pain: no Lower extremity edema: no Dizzy/lightheaded: no   Relevant past medical, surgical, family and social history reviewed and updated as indicated. Interim medical history since our last visit reviewed. Allergies and medications reviewed and updated.  Review of Systems  Constitutional:  Negative for activity change, appetite change, diaphoresis, fatigue and fever.  Respiratory:  Negative for cough, chest tightness, shortness of breath and wheezing.   Cardiovascular:  Negative for chest pain, palpitations and leg swelling.  Gastrointestinal: Negative.   Endocrine: Negative for cold intolerance, heat intolerance, polydipsia, polyphagia and polyuria.  Neurological: Negative.   Psychiatric/Behavioral: Negative.     Per HPI unless specifically indicated above     Objective:    BP 128/67   Pulse 80   Temp 98.4 F (36.9 C) (Oral)    Ht 5' (1.524 m)   Wt 200 lb 6.4 oz (90.9 kg)   SpO2 95%   BMI 39.14 kg/m   Wt Readings from Last 3 Encounters:  05/18/23 200 lb 6.4 oz (90.9 kg)  04/13/23 204 lb 6.4 oz (92.7 kg)  02/24/23 211 lb 12.8 oz (96.1 kg)    Physical Exam Vitals and nursing note reviewed.  Constitutional:      General: She is awake. She is not in acute distress.    Appearance: Normal appearance. She is well-developed and well-groomed. She is obese. She is not ill-appearing or toxic-appearing.  HENT:     Head: Normocephalic.     Right Ear: Hearing and external ear normal.     Left Ear: Hearing and external ear normal.  Eyes:     General: Lids are normal.        Right eye: No discharge.        Left eye: No discharge.     Conjunctiva/sclera: Conjunctivae normal.     Pupils: Pupils are equal, round, and reactive to light.  Neck:     Thyroid: No thyromegaly.     Vascular: No carotid bruit.  Cardiovascular:     Rate and Rhythm: Normal rate and regular rhythm.     Heart sounds: Murmur heard.     Systolic murmur is present with a grade of 2/6.     No gallop.     Comments: Bilateral legs and ankles with no edema and good hair pattern present. Pulmonary:     Effort: Pulmonary effort is normal. No accessory muscle  usage or respiratory distress.     Breath sounds: Normal breath sounds.  Abdominal:     General: Bowel sounds are normal. There is no distension.     Palpations: Abdomen is soft.     Tenderness: There is no abdominal tenderness.  Musculoskeletal:     Cervical back: Normal range of motion and neck supple.     Right lower leg: No edema.     Left lower leg: No edema.  Lymphadenopathy:     Cervical: No cervical adenopathy.  Skin:    General: Skin is warm and dry.  Neurological:     Mental Status: She is alert and oriented to person, place, and time.     Deep Tendon Reflexes: Reflexes are normal and symmetric.     Reflex Scores:      Brachioradialis reflexes are 2+ on the right side and 2+  on the left side.      Patellar reflexes are 2+ on the right side and 2+ on the left side. Psychiatric:        Attention and Perception: Attention normal.        Mood and Affect: Mood normal.        Speech: Speech normal.        Behavior: Behavior normal. Behavior is cooperative.        Thought Content: Thought content normal.     Results for orders placed or performed in visit on 05/02/23  Basic metabolic panel   Collection Time: 05/02/23 10:25 AM  Result Value Ref Range   Glucose 109 (H) 70 - 99 mg/dL   BUN 18 8 - 27 mg/dL   Creatinine, Ser 0.98 (H) 0.57 - 1.00 mg/dL   eGFR 43 (L) >11 BJ/YNW/2.95   BUN/Creatinine Ratio 14 12 - 28   Sodium 145 (H) 134 - 144 mmol/L   Potassium 3.7 3.5 - 5.2 mmol/L   Chloride 102 96 - 106 mmol/L   CO2 26 20 - 29 mmol/L   Calcium 9.5 8.7 - 10.3 mg/dL      Assessment & Plan:   Problem List Items Addressed This Visit       Cardiovascular and Mediastinum   Hypertension associated with diabetes (HCC)   Chronic, ongoing with BP much improved today.  At this time will continue all current medications and adjust as needed.  Continue collaboration with cardiology and nephrology, recent notes and labs reviewed.  Recommend she monitor BP at least a few mornings a week at home and document.  DASH diet at home.  Labs: up to date on 05/02/23.       Relevant Medications   amLODipine (NORVASC) 2.5 MG tablet     Follow up plan: Return in about 8 weeks (around 07/15/2023) for T2DM, HTN/HLD, MOOD, HF, CKD.

## 2023-05-18 NOTE — Assessment & Plan Note (Signed)
Chronic, ongoing with BP much improved today.  At this time will continue all current medications and adjust as needed.  Continue collaboration with cardiology and nephrology, recent notes and labs reviewed.  Recommend she monitor BP at least a few mornings a week at home and document.  DASH diet at home.  Labs: up to date on 05/02/23.

## 2023-05-18 NOTE — Progress Notes (Signed)
   05/18/2023  Patient ID: Cindy [name], female   DOB: [birth year], 72 y.o.   MRN: [id number]  Patient presenting to [facility] to bring [employer] denial letter for me to copy in case this is needed when it comes time to renew her Eliquis PAP application (once OOP requirement is met).  I have made a copy and will scan this into her media tab.  Telephone follow-up scheduled first of March to check on OOP spending on medications to see when we may be able to get Eliquis PAP approved.  [name], PharmD, DPLA

## 2023-06-05 ENCOUNTER — Other Ambulatory Visit: Payer: Self-pay | Admitting: Nurse Practitioner

## 2023-06-07 NOTE — Telephone Encounter (Signed)
 Requested Prescriptions  Refused Prescriptions Disp Refills   bisoprolol  (ZEBETA ) 10 MG tablet [Pharmacy Med Name: BISOPROLOL  FUMARATE 10MG  TABLETS] 180 tablet 4    Sig: TAKE 1 TABLET(10 MG) BY MOUTH TWICE DAILY     Cardiovascular: Beta Blockers 2 Failed - 06/07/2023  3:26 PM      Failed - Cr in normal range and within 360 days    Creatinine  Date Value Ref Range Status  01/24/2014 0.86 0.60 - 1.30 mg/dL Final   Creatinine, Ser  Date Value Ref Range Status  05/02/2023 1.31 (H) 0.57 - 1.00 mg/dL Final         Passed - Last BP in normal range    BP Readings from Last 1 Encounters:  05/18/23 128/67         Passed - Last Heart Rate in normal range    Pulse Readings from Last 1 Encounters:  05/18/23 80         Passed - Valid encounter within last 6 months    Recent Outpatient Visits           2 weeks ago Hypertension associated with diabetes (HCC)   [place] [facility] [place], [place] T, NP   1 month ago Type 2 diabetes mellitus with morbid obesity (HCC)   [place] [facility] [place], Melanie T, NP   3 months ago Dyspnea on exertion   [place] [facility] Her[place] SQUIBB, MD   3 months ago Hypertension associated with diabetes [facility])   [place] [facility] Herold Hadassah SQUIBB, MD   5 months ago Type 2 diabetes mellitus with morbid obesity (HCC)   [place] [facility] [place], Melanie DASEN, NP       Future Appointments             In 1 month Cannady, Melanie DASEN, NP [place] [facility], [facility]

## 2023-06-10 ENCOUNTER — Other Ambulatory Visit: Payer: Self-pay | Admitting: Nurse Practitioner

## 2023-06-13 NOTE — Telephone Encounter (Signed)
 Request is too soon, last refill 04/13/23 for 90 and 3 refills.  Requested Prescriptions  Pending Prescriptions Disp Refills   montelukast  (SINGULAIR ) 10 MG tablet [Pharmacy Med Name: MONTELUKAST  10MG  TABLETS] 90 tablet 4    Sig: TAKE 1 TABLET(10 MG) BY MOUTH AT BEDTIME     Pulmonology:  Leukotriene Inhibitors Passed - 06/13/2023  2:31 PM      Passed - Valid encounter within last 12 months    Recent Outpatient Visits           3 weeks ago Hypertension associated with diabetes (HCC)   [place] [facility] [place], [place] T, NP   2 months ago Type 2 diabetes mellitus with morbid obesity (HCC)   [place] [facility] [place], Melanie T, NP   3 months ago Dyspnea on exertion   [place] [facility] Herold Hadassah SQUIBB, MD   3 months ago Hypertension associated with diabetes [facility])   [place] [facility] Herold Hadassah SQUIBB, MD   5 months ago Type 2 diabetes mellitus with morbid obesity (HCC)   [place] [facility] [place], Melanie DASEN, NP       Future Appointments             In 1 month Cannady, Melanie DASEN, NP  [facility], [facility]

## 2023-06-14 ENCOUNTER — Other Ambulatory Visit: Payer: Self-pay | Admitting: Nurse Practitioner

## 2023-06-14 NOTE — Telephone Encounter (Unsigned)
 Copied from CRM [id number]. Topic: General - Other >> Jun 14, 2023 10:30 AM Everette C wrote: Reason for CRM: Medication Refill - Most Recent Primary Care Visit:  Provider: CANNADY, JOLENE T Department: [facility] Visit Type: OFFICE VISIT Date: 05/18/2023  Medication: montelukast  (SINGULAIR ) 10 MG tablet [[id number]]  Has the patient contacted their pharmacy? Yes (Agent: If no, request that the patient contact the pharmacy for the refill. If patient does not wish to contact the pharmacy document the reason why and proceed with request.) (Agent: If yes, when and what did the pharmacy advise?)  Is this the correct pharmacy for this prescription? Yes If no, delete pharmacy and type the correct one.  This is the patient's preferred pharmacy:  [id number], [place] - [address] AT [facility] [address] [place] [address] Phone: [phone] Fax: [phone]    Has the prescription been filled recently? No  Is the patient out of the medication? Yes  Has the patient been seen for an appointment in the last year OR does the patient have an upcoming appointment? Yes  Can we respond through MyChart? No  Agent: Please be advised that Rx refills may take up to 3 business days. We ask that you follow-up with your pharmacy.

## 2023-06-15 ENCOUNTER — Telehealth: Payer: Self-pay | Admitting: *Deleted

## 2023-06-15 NOTE — Patient Outreach (Signed)
 Care Coordination   Follow Up Visit Note   06/15/2023 Name: Cindy [name] MRN: [id number] DOB: [birth year]  Cindy [name] is a 73 y.o. year old female who sees [place], Sanjuan Crumbly T, NP for primary care. I spoke with  Cindy [name] by phone today.  What matters to the patients health and wellness today?  Call received from patient, upset stating she received call from insurance company that current PCP would no longer be in network.  Issue investigated, per [facility], PCP remains in network.  Attempted to inform patient, she didn't answer phone on call back.  Will try again tomorrow.    Goals Addressed             This Visit's Progress    COMPLETED: RNCM Care Management Expected Outcome:  Monitor, Self-Manage and Reduce Symptoms of Afib   On track    Current Barriers:  Chronic Disease Management support and education needs related to effective management of AFIB  Planned Interventions: Provider order and care plan reviewed. Collaborated with PharmD regarding patient care and plan.  Counseled on increased risk of stroke due to Afib and benefits of anticoagulation for stroke prevention. Denies any safety concerns or new issues related to increased blood pressures or changes that put her at increase risk of HA or stroke           Reviewed importance of adherence to anticoagulant exactly as prescribed. The patient is complaint with medications.  Advised patient to discuss changes in her heart rate or AFIB with provider Counseled on bleeding risk associated with AFIB and importance of self-monitoring for signs/symptoms of bleeding Counseled on avoidance of NSAIDs due to increased bleeding risk with anticoagulants Counseled on importance of regular laboratory monitoring as prescribed. Has lab work on a regular basis. The patient is complaint Counseled on seeking medical attention after a head injury or if there is blood in the urine/stool. Review of being safe and watching for potential fall  risk.  Afib action plan reviewed Screening for signs and symptoms of depression related to chronic disease state  Symptom Management: Take medications as prescribed   Attend all scheduled provider appointments Call provider office for new concerns or questions  call the Suicide and Crisis Lifeline: 988 call the [place]  National Suicide Prevention Lifeline: (309) 878-4794 or TTY: 779 817 1668 TTY 520-885-3720) to talk to a trained counselor call 1-800-273-TALK (toll free, 24 hour hotline) if experiencing a Mental Health or Behavioral Health Crisis  - make a plan to eat healthy - keep all lab appointments - take medicine as prescribed        COMPLETED: RNCM Care Management Expected Outcome:  Monitor, Self-Manage and Reduce Symptoms of Diabetes   On track    Current Barriers:  Chronic Disease Management support and education needs related to effective management of DM Financial Constraints.  Lab Results  Component Value Date   HGBA1C 5.6 04/13/2023     Planned Interventions: Provided education to patient about basic DM disease process. Review and education provided.  Reviewed medications with patient and discussed importance of medication adherence. The patient is working with the pharm D for ongoing support and education and medication management.  Reviewed prescribed diet with patient heart healthy/ADA diet. Review and education provided. The patient states that she is watching her portion size; Counseled on importance of regular laboratory monitoring as prescribed. Has lab work on a regular basis. Discussed plans with patient for ongoing care management follow up and provided patient with direct contact information for  care management team;      Provided patient with written educational materials related to hypo and hyperglycemia and importance of correct treatment;            Advised patient, providing education and rationale, to check cbg once daily and when you have symptoms of low  or high blood sugar and record. The patient denies any hypoglycemic events       Review of patient status, including review of consultants reports, relevant laboratory and other test results, and medications completed. Has labs on a regular basis.;       Advised patient to discuss changes in DM or questions or concerns with provider;      Screening for signs and symptoms of depression related to chronic disease state;           Symptom Management: Take medications as prescribed   Attend all scheduled provider appointments Call provider office for new concerns or questions  call the Suicide and Crisis Lifeline: 988 call the [place]  National Suicide Prevention Lifeline: 762-478-0769 or TTY: (502) 336-3150 TTY (425) 660-4857) to talk to a trained counselor call 1-800-273-TALK (toll free, 24 hour hotline) if experiencing a Mental Health or Behavioral Health Crisis  check feet daily for cuts, sores or redness trim toenails straight across manage portion size wash and dry feet carefully every day wear comfortable, cotton socks wear comfortable, well-fitting shoes        COMPLETED: RNCM Care Management Expected Outcome:  Monitor, Self-Manage and Reduce Symptoms of: CKD   On track    Current Barriers:  Knowledge Deficits related to importance of kidney protection and monitoring for dehydration or changes in conditions that may impact kidney function Chronic Disease Management support and education needs related to effective management of CKD EGFR at 30  Planned Interventions: Assessed the patient  understanding of chronic kidney disease.  Evaluation of current treatment plan related to chronic kidney disease self management and patient's adherence to plan as established by provider      Provided education to patient re: stroke prevention, s/s of heart attack and stroke    Reviewed prescribed diet heart healthy/ADA diet. Is compliant with dietary restrictions. Is monitoring for changes in her  diet. Reviewed medications with patient and discussed importance of compliance. The patient is compliant with medications. Denies any issues with medications at this time.     Advised patient, providing education and rationale, to monitor blood pressure daily and record, calling PCP for findings outside established parameters. Blood pressures are more stable and doing good. The patient states she is feeling better.     Discussed complications of poorly controlled blood pressure such as heart disease, stroke, circulatory complications, vision complications, kidney impairment, sexual dysfunction   Advised patient to discuss changes in her kidney function, questions or concerns with provider    Discussed plans with patient for ongoing care management follow up and provided patient with direct contact information for care management team    Screening for signs and symptoms of depression related to chronic disease state      Discussed the impact of chronic kidney disease on daily life and mental health and acknowledged and normalized feelings of disempowerment, fear, and frustration     Symptom Management: Take medications as prescribed   Attend all scheduled provider appointments Call provider office for new concerns or questions  call the Suicide and Crisis Lifeline: 988 call the [place]  National Suicide Prevention Lifeline: 201-771-5497 or TTY: 808-575-0596 TTY (208)498-2364) to talk to a trained counselor  call 1-800-273-TALK (toll free, 24 hour hotline) if experiencing a Mental Health or Behavioral Health Crisis          COMPLETED: RNCM Care Management Expected Outcome:  Monitor, Self-Manage, and Reduce Symptoms of Hypertension   On track    Current Barriers:  Chronic Disease Management support and education needs related to effective management of HTN BP Readings from Last 3 Encounters:  05/18/23 128/67  04/13/23 138/68  02/24/23 (!) 160/74     Planned Interventions: Evaluation of  current treatment plan related to hypertension self management and patient's adherence to plan as established by provider.  Provided education to patient re: stroke prevention, s/s of heart attack and stroke; Reviewed prescribed diet heart healthy/ADA diet. Education provided on heart healthy/ADA diet.  Reviewed medications with patient and discussed importance of compliance. The patient is compliant with medications. Working with the pharm D for management of medications and PAP assistance. Continues to work with pharm D. Denies any issues with medications at this time. ;  Discussed plans with patient for ongoing care management follow up and provided patient with direct contact information for care management team; Advised patient, providing education and rationale, to monitor blood pressure daily and record, calling PCP for findings outside established parameters;  Advised patient to discuss changes in blood pressure or heart health with provider; Provided education on prescribed diet heart healthy/ADA diet ;  Discussed complications of poorly controlled blood pressure such as heart disease, stroke, circulatory complications, vision complications, kidney impairment, sexual dysfunction;  Screening for signs and symptoms of depression related to chronic disease state;   Symptom Management: Take medications as prescribed   Attend all scheduled provider appointments Call provider office for new concerns or questions  call the Suicide and Crisis Lifeline: 988 call the [place]  National Suicide Prevention Lifeline: 662-393-9252 or TTY: 214 385 4338 TTY 915-785-0387) to talk to a trained counselor call 1-800-273-TALK (toll free, 24 hour hotline) if experiencing a Mental Health or Behavioral Health Crisis  check blood pressure weekly call doctor for signs and symptoms of high blood pressure develop an action plan for high blood pressure keep all doctor appointments take medications for blood pressure  exactly as prescribed report new symptoms to your doctor           SDOH assessments and interventions completed:  No     Care Coordination Interventions:  Yes, provided   Interventions Today    Flowsheet Row Most Recent Value  Chronic Disease   Chronic disease during today's visit Diabetes, Congestive Heart Failure (CHF), Hypertension (HTN), Atrial Fibrillation (AFib), Chronic Kidney Disease/End Stage Renal Disease (ESRD)  General Interventions   General Interventions Discussed/Reviewed General Interventions Reviewed, Labs, Doctor Visits, Communication with, Durable Medical Equipment (DME)  Labs Hgb A1c every 3 months  Doctor Visits Discussed/Reviewed Doctor Visits Reviewed, PCP, Specialist  [Reviewed upcoming 2/14 with PCP, 3/4 for echo, 3/5 with pharmacist, and 3/19 with cardiology]  Durable Medical Equipment (DME) BP Cuff  [Report monitoring BP, range 140s/80s, advised if higher to report to provider.  Does not look at HR, advised to do so with history of A-fib]  PCP/Specialist Visits Compliance with follow-up visit  Communication with --  [call placed to PCP office and insurance company to inquire about current coverage for provider]  Exercise Interventions   Exercise Discussed/Reviewed Weight Managment  Weight Management Weight maintenance  [Report last weight was 202 pounds, does not weigh daily, feels her scale is inaccurate.  Advised to obtain new scale]  Education Interventions  Education Provided Provided Education  Provided Verbal Education On Medication, When to see the doctor, Insurance Plans  [place] been out of Jardiance , state she has this now and taking as instructed. concerned that PCP may be out of network, confirmed via insurance website and via call to insurance company that current PCP remains in network]       Follow up plan: Follow up call scheduled for 2/18    Encounter Outcome:  Patient Visit Completed   Holland Lundborg, RN, MSN, CCM [place]   [facility], [facility] Health RN Care Coordinator Direct Dial: [phone] / Main [phone] Fax [phone] Email: [email or web address] Website: [email or web address]

## 2023-06-15 NOTE — Telephone Encounter (Signed)
 Too early. Refilled 04/13/23 #90 with 3 refills. Requested Prescriptions  Refused Prescriptions Disp Refills   montelukast  (SINGULAIR ) 10 MG tablet 90 tablet 4    Sig: Take 1 tablet (10 mg total) by mouth at bedtime.     Pulmonology:  Leukotriene Inhibitors Passed - 06/15/2023 10:44 AM      Passed - Valid encounter within last 12 months    Recent Outpatient Visits           4 weeks ago Hypertension associated with diabetes (HCC)   [place] [facility] [place], [place] T, NP   2 months ago Type 2 diabetes mellitus with morbid obesity (HCC)   [place] [facility] [place], Sanjuan Crumbly T, NP   3 months ago Dyspnea on exertion   [place] [facility] Hadassah Letters, MD   3 months ago Hypertension associated with diabetes [facility]   [place] [facility] Hadassah Letters, MD   5 months ago Type 2 diabetes mellitus with morbid obesity (HCC)   [place] [facility] [place], Lavelle Posey, NP       Future Appointments             In 1 month Cannady, Jolene T, NP [place] [facility]

## 2023-06-15 NOTE — Patient Instructions (Signed)
 Visit Information  Thank you for taking time to visit with me today. Please don't hesitate to contact me if I can be of assistance to you before our next scheduled telephone appointment.  Following are the goals we discussed today:  Buy new scale and monitor weights daily.  Monitor blood pressure and heart rate daily. PCP remains in network with your insurance.   Our next appointment is by telephone on 2/18  Please call the care guide team at [phone] if you need to cancel or reschedule your appointment.   Please call the Suicide and Crisis Lifeline: 988 call the [place]  National Suicide Prevention Lifeline: 313-431-7330 or TTY: 309-515-2751 TTY (334)369-5447) to talk to a trained counselor call 1-800-273-TALK (toll free, 24 hour hotline) call 911 if you are experiencing a Mental Health or Behavioral Health Crisis or need someone to talk to.  The patient verbalized understanding of instructions, educational materials, and care plan provided today and agreed to receive a mailed copy of patient instructions, educational materials, and care plan.   The patient has been provided with contact information for the care management team and has been advised to call with any health related questions or concerns.   Holland Lundborg, RN, MSN, CCM [facility], [facility] Health RN Care Coordinator Direct Dial: [phone] / Main [phone] Fax [phone] Email: [email or web address]

## 2023-06-16 ENCOUNTER — Telehealth: Payer: Self-pay

## 2023-06-16 ENCOUNTER — Telehealth: Payer: Self-pay | Admitting: *Deleted

## 2023-06-16 NOTE — Patient Outreach (Signed)
  Care Coordination   Follow Up Visit Note   06/16/2023 Name: Cindy [name] MRN: [id number] DOB: Sep 23, 1950  Cindy [name] is a 73 y.o. year old female who sees [place], Corrie Dandy T, NP for primary care. I spoke with  [name] by phone today.  What matters to the patients health and wellness today?  Patient advised per [facility] and PCP office, Corrie Dandy is and will continue to be in network.  They are looking into a glitch in the system.  Patient verbalizes understanding and is appreciative.     SDOH assessments and interventions completed:  No     Care Coordination Interventions:  No, not indicated   Follow up plan: Follow up call scheduled for 2/18    Encounter Outcome:  Patient Visit Completed   Rodney Langton, RN, MSN, CCM   [facility], [facility] Health RN Care Manager Direct Dial: [phone] / Main [phone] Fax [phone] Email: [email or web address] Website: [email or web address]

## 2023-06-16 NOTE — Progress Notes (Signed)
   06/16/2023  Patient ID: Cindy [name], female   DOB: 03/01/1951, 73 y.o.   MRN: [id number]  Returning missed call and voicemail from Mr. Cindy [name].  Cindy [name] was informed that her insurance was no longer contracted with [facility]; however it appears that this was an error, and she will be able to be followed by Cindy Dials, NP.  Patient is endorsing that she is rather low on Jardiance, so I contacted the patient assistance program; but they state a 90-day prescription was delivered to her in December.  Patient is on automatic refill, so the company will fill this prescription when it is time to go out to her next.  Patient and I have a follow-up visit scheduled in March to see what she has paid out-of-pocket at that point; so we can figure out when we can apply for Eliquis patient assistance for her for this year.    [name], PharmD, DPLA

## 2023-06-21 ENCOUNTER — Telehealth: Payer: Self-pay | Admitting: *Deleted

## 2023-06-21 NOTE — Patient Outreach (Signed)
  Care Coordination   Follow Up Visit Note   06/21/2023 Name: Cindy [name] MRN: [id number] DOB: 1950-06-20  Cindy [name] is a 73 y.o. year old female who sees [place], Corrie Dandy T, NP for primary care. I spoke with  [name] by phone today.  What matters to the patients health and wellness today?  Patient called, state she has not received letter and new insurance card stating J. Cannady is no longer within network for [employer].  Confirmed again with [employer] and with PCP office that PCP is still within network and the issue is being investigated, possibly a glitch in the system.     Goals Addressed             This Visit's Progress    Effective management of chornic medical conditions       Interventions Today    Flowsheet Row Most Recent Value  Chronic Disease   Chronic disease during today's visit Other  [medicare coverage]  Education Interventions   Education Provided Provided Education  Provided Verbal Education On Insurance Plans  [employer] Medicare provider coverage, confirmed Corrie Dandy is still within network]              SDOH assessments and interventions completed:  No     Care Coordination Interventions:  Yes, provided   Follow up plan: Follow up call scheduled for 2/18    Encounter Outcome:  Patient Visit Completed   Rodney Langton, RN, MSN, CCM [place]  [facility], [facility] Health RN Care Coordinator Direct Dial: [phone] / Main [phone] Fax [phone] Email: [email or web address] Website: [email or web address]

## 2023-07-02 ENCOUNTER — Other Ambulatory Visit: Payer: Self-pay | Admitting: Nurse Practitioner

## 2023-07-02 DIAGNOSIS — E1159 Type 2 diabetes mellitus with other circulatory complications: Secondary | ICD-10-CM

## 2023-07-04 NOTE — Telephone Encounter (Signed)
Requested Prescriptions  Pending Prescriptions Disp Refills   spironolactone (ALDACTONE) 25 MG tablet [Pharmacy Med Name: SPIRONOLACTONE 25MG  TABLETS] 180 tablet 0    Sig: TAKE 2 TABLETS(50 MG) BY MOUTH DAILY     Cardiovascular: Diuretics - Aldosterone Antagonist Failed - 07/04/2023  1:55 PM      Failed - Cr in normal range and within 180 days    Creatinine  Date Value Ref Range Status  01/24/2014 0.86 0.60 - 1.30 mg/dL Final   Creatinine, Ser  Date Value Ref Range Status  05/02/2023 1.31 (H) 0.57 - 1.00 mg/dL Final         Failed - Na in normal range and within 180 days    Sodium  Date Value Ref Range Status  05/02/2023 145 (H) 134 - 144 mmol/L Final  01/24/2014 143 136 - 145 mmol/L Final         Passed - K in normal range and within 180 days    Potassium  Date Value Ref Range Status  05/02/2023 3.7 3.5 - 5.2 mmol/L Final  01/24/2014 3.4 (L) 3.5 - 5.1 mmol/L Final         Passed - eGFR is 30 or above and within 180 days    EGFR (African American)  Date Value Ref Range Status  01/24/2014 >60  Final   GFR calc Af Amer  Date Value Ref Range Status  03/19/2017 >60 >60 mL/min Final    Comment:    (NOTE) The eGFR has been calculated using the CKD EPI equation. This calculation has not been validated in all clinical situations. eGFR's persistently <60 mL/min signify possible Chronic Kidney Disease.    EGFR (Non-African Amer.)  Date Value Ref Range Status  01/24/2014 >60  Final    Comment:    eGFR values <11mL/min/1.73 m2 may be an indication of chronic kidney disease (CKD). Calculated eGFR is useful in patients with stable renal function. The eGFR calculation will not be reliable in acutely ill patients when serum creatinine is changing rapidly. It is not useful in  patients on dialysis. The eGFR calculation may not be applicable to patients at the low and high extremes of body sizes, pregnant women, and vegetarians.    GFR, Estimated  Date Value Ref Range Status   04/05/2022 33 (L) >60 mL/min Final    Comment:    (NOTE) Calculated using the CKD-EPI Creatinine Equation (2021)    eGFR  Date Value Ref Range Status  05/02/2023 43 (L) >59 mL/min/1.73 Final         Passed - Last BP in normal range    BP Readings from Last 1 Encounters:  05/18/23 128/67         Passed - Valid encounter within last 6 months    Recent Outpatient Visits           1 month ago Hypertension associated with diabetes (HCC)   [place] [facility] [place], [place] T, NP   2 months ago Type 2 diabetes mellitus with morbid obesity (HCC)   [place] [facility] [place], Corrie Dandy T, NP   4 months ago Dyspnea on exertion   [place] [facility] Jackolyn Confer, MD   4 months ago Hypertension associated with diabetes [facility]   [place] Jackolyn Confer, MD   6 months ago Type 2 diabetes mellitus with morbid obesity [facility])   [place] [facility] [place], Dorie Rank, NP  Future Appointments             In 1 week Cannady, Dorie Rank, NP [place] [facility], [facility]

## 2023-07-05 NOTE — Telephone Encounter (Signed)
 Returned pt's phone call to inform her that I got her voicemail and her new telephone number has been updated on her account. Pt verbalized understanding and does not have any further questions at this time.  Rosie J Australie, RN

## 2023-07-10 DIAGNOSIS — E119 Type 2 diabetes mellitus without complications: Secondary | ICD-10-CM | POA: Insufficient documentation

## 2023-07-10 NOTE — Patient Instructions (Signed)
Be Involved in Caring For Your Health:  Taking Medications When medications are taken as directed, they can greatly improve your health. But if they are not taken as prescribed, they may not work. In some cases, not taking them correctly can be harmful. To help ensure your treatment remains effective and safe, understand your medications and how to take them. Bring your medications to each visit for review by your provider.  Your lab results, notes, and after visit summary will be available on My Chart. We strongly encourage you to use this feature. If lab results are abnormal the clinic will contact you with the appropriate steps. If the clinic does not contact you assume the results are satisfactory. You can always view your results on My Chart. If you have questions regarding your health or results, please contact the clinic during office hours. You can also ask questions on My Chart.  We at [facility] are grateful that you chose [place] to provide your care. We strive to provide evidence-based and compassionate care and are always looking for feedback. If you get a survey from the clinic please complete this so we can hear your opinions.  Diabetes Mellitus and Foot Care Diabetes, also called diabetes mellitus, may cause problems with your feet and legs because of poor blood flow (circulation). Poor circulation may make your skin: Become thinner and drier. Break more easily. Heal more slowly. Peel and crack. You may also have nerve damage (neuropathy). This can cause decreased feeling in your legs and feet. This means that you may not notice minor injuries to your feet that could lead to more serious problems. Finding and treating problems early is the best way to prevent future foot problems. How to care for your feet Foot hygiene  Wash your feet daily with warm water and mild soap. Do not use hot water. Then, pat your feet and the areas between your toes until they are fully dry. Do  not soak your feet. This can dry your skin. Trim your toenails straight across. Do not dig under them or around the cuticle. File the edges of your nails with an emery board or nail file. Apply a moisturizing lotion or petroleum jelly to the skin on your feet and to dry, brittle toenails. Use lotion that does not contain alcohol and is unscented. Do not apply lotion between your toes. Shoes and socks Wear clean socks or stockings every day. Make sure they are not too tight. Do not wear knee-high stockings. These may decrease blood flow to your legs. Wear shoes that fit well and have enough cushioning. Always look in your shoes before you put them on to be sure there are no objects inside. To break in new shoes, wear them for just a few hours a day. This prevents injuries on your feet. Wounds, scrapes, corns, and calluses  Check your feet daily for blisters, cuts, bruises, sores, and redness. If you cannot see the bottom of your feet, use a mirror or ask someone for help. Do not cut off corns or calluses or try to remove them with medicine. If you find a minor scrape, cut, or break in the skin on your feet, keep it and the skin around it clean and dry. You may clean these areas with mild soap and water. Do not clean the area with peroxide, alcohol, or iodine. If you have a wound, scrape, corn, or callus on your foot, look at it several times a day to make sure it  is healing and not infected. Check for: Redness, swelling, or pain. Fluid or blood. Warmth. Pus or a bad smell. General tips Do not cross your legs. This may decrease blood flow to your feet. Do not use heating pads or hot water bottles on your feet. They may burn your skin. If you have lost feeling in your feet or legs, you may not know this is happening until it is too late. Protect your feet from hot and cold by wearing shoes, such as at the beach or on hot pavement. Schedule a complete foot exam at least once a year or more often if  you have foot problems. Report any cuts, sores, or bruises to your health care provider right away. Where to find more information American Diabetes Association: diabetes.org Association of Diabetes Care & Education Specialists: diabeteseducator.org Contact a health care provider if: You have a condition that increases your risk of infection, and you have any cuts, sores, or bruises on your feet. You have an injury that is not healing. You have redness on your legs or feet. You feel burning or tingling in your legs or feet. You have pain or cramps in your legs and feet. Your legs or feet are numb. Your feet always feel cold. You have pain around any toenails. Get help right away if: You have a wound, scrape, corn, or callus on your foot and: You have signs of infection. You have a fever. You have a red line going up your leg. This information is not intended to replace advice given to you by your health care provider. Make sure you discuss any questions you have with your health care provider. Document Revised: 11/18/2021 Document Reviewed: 11/18/2021 Elsevier Patient Education  2024 [employer].

## 2023-07-15 ENCOUNTER — Encounter: Payer: Self-pay | Admitting: Nurse Practitioner

## 2023-07-15 ENCOUNTER — Ambulatory Visit (INDEPENDENT_AMBULATORY_CARE_PROVIDER_SITE_OTHER): Payer: Medicare HMO | Admitting: Nurse Practitioner

## 2023-07-15 DIAGNOSIS — E119 Type 2 diabetes mellitus without complications: Secondary | ICD-10-CM | POA: Diagnosis not present

## 2023-07-15 DIAGNOSIS — E785 Hyperlipidemia, unspecified: Secondary | ICD-10-CM | POA: Diagnosis not present

## 2023-07-15 DIAGNOSIS — Z23 Encounter for immunization: Secondary | ICD-10-CM

## 2023-07-15 DIAGNOSIS — I700 Atherosclerosis of aorta: Secondary | ICD-10-CM | POA: Diagnosis not present

## 2023-07-15 DIAGNOSIS — E210 Primary hyperparathyroidism: Secondary | ICD-10-CM

## 2023-07-15 DIAGNOSIS — N184 Chronic kidney disease, stage 4 (severe): Secondary | ICD-10-CM

## 2023-07-15 DIAGNOSIS — I480 Paroxysmal atrial fibrillation: Secondary | ICD-10-CM | POA: Diagnosis not present

## 2023-07-15 DIAGNOSIS — E1159 Type 2 diabetes mellitus with other circulatory complications: Secondary | ICD-10-CM | POA: Diagnosis not present

## 2023-07-15 DIAGNOSIS — E1169 Type 2 diabetes mellitus with other specified complication: Secondary | ICD-10-CM

## 2023-07-15 DIAGNOSIS — E1122 Type 2 diabetes mellitus with diabetic chronic kidney disease: Secondary | ICD-10-CM | POA: Diagnosis not present

## 2023-07-15 DIAGNOSIS — I152 Hypertension secondary to endocrine disorders: Secondary | ICD-10-CM | POA: Diagnosis not present

## 2023-07-15 DIAGNOSIS — F321 Major depressive disorder, single episode, moderate: Secondary | ICD-10-CM

## 2023-07-15 DIAGNOSIS — D631 Anemia in chronic kidney disease: Secondary | ICD-10-CM

## 2023-07-15 DIAGNOSIS — G4733 Obstructive sleep apnea (adult) (pediatric): Secondary | ICD-10-CM

## 2023-07-15 LAB — MICROALBUMIN, URINE WAIVED
Creatinine, Urine Waived: 100 mg/dL (ref 10–300)
Microalb, Ur Waived: 150 mg/L — ABNORMAL HIGH (ref 0–19)

## 2023-07-15 LAB — BAYER DCA HB A1C WAIVED: HB A1C (BAYER DCA - WAIVED): 5.9 % — ABNORMAL HIGH (ref 4.8–5.6)

## 2023-07-15 MED ORDER — TIZANIDINE HCL 2 MG PO TABS: 2.0000 mg | ORAL_TABLET | Freq: Four times a day (QID) | ORAL | 0 refills | Status: AC | PRN

## 2023-07-15 NOTE — Assessment & Plan Note (Signed)
Chronic, ongoing with BP at goal for age today.  At this time will continue all current medications and adjust as needed.  Continue collaboration with cardiology and nephrology, recent notes and labs reviewed.  Recommend she monitor BP at least a few mornings a week at home and document.  DASH diet at home.  Labs: up to date.

## 2023-07-15 NOTE — Progress Notes (Addendum)
BP 133/70 (BP Location: Left Arm, Cuff Size: Large)   Pulse 69   Temp 98.6 F (37 C) (Oral)   Ht 5' (1.524 m)   Wt 202 lb 3.2 oz (91.7 kg)   SpO2 95%   BMI 39.49 kg/m    Subjective:    Patient ID: Cindy [name], female    DOB: 03/15/1951, 73 y.o.   MRN: [id number]  HPI: Cindy [name] is a 73 y.o. female  Chief Complaint  Patient presents with   COPD   Chronic Kidney Disease   Diabetes   Hyperlipidemia   Hypertension   DIABETES Last A1c November 5.6%.  Taking Jardiance and Ozempic. [facility] Pharm[facility] for cost assist with Jardiance, Eliquis, and Ozempic -- obtains through programs.   Hypoglycemic episodes:no Polydipsia/polyuria: no Visual disturbance: no Chest pain: no Paresthesias: no Glucose Monitoring: yes             Accucheck frequency: Daily             Fasting glucose: 108 to 130             Post prandial:             Evening:             Before meals: Taking Insulin?: no             Long acting insulin:             Short acting insulin: Blood Pressure Monitoring: a few times a week Retinal Examination: Up To Date -- goes next in May Foot Exam: Up to Date  Pneumovax: Up To Date  Influenza: Up To Date Aspirin: no    HYPERTENSION / HYPERLIPIDEMIA/A-FIB/HF Saw cardiology 05/04/23  Pulmonary with last visit on 10/18/22. Uses CPAP daily. Continues Olmesartan, Metoprolol, Lasix, Spironolactone, Amlodipine, Aorvastatin  Takes Eliquis (gets via assistance). Echo on 03/15/23 noted LVEF >70% and moderate pericardial effusion. Satisfied with current treatment? yes Duration of hypertension: chronic BP monitoring frequency: three times a week BP range: <130/80 BP medication side effects: no Duration of hyperlipidemia: chronic Cholesterol medication side effects: no Cholesterol supplements: none Medication compliance: good compliance Aspirin: no Recent stressors: no Recurrent headaches: no Visual changes: no Palpitations: occasional  Dyspnea: with activity Chest  pain: no Lower extremity edema: no Dizzy/lightheaded: every once and awhile  CHRONIC KIDNEY DISEASE (CKD 3b) Last nephrology 04/27/23 - CRT 1.39, eGFR 40, PTHH 96, H/H 11.9/37.6 CKD status: stable Medications renally dose: yes Previous renal evaluation: no Pneumovax:  Up To Date Influenza Vaccine:  Not up to Date --refuses    Latest Ref Rng & Units 05/02/2023   10:25 AM 04/13/2023   10:23 AM 02/22/2023   11:46 AM  BMP  Glucose 70 - 99 mg/dL 401  027  253   BUN 8 - 27 mg/dL 18  12  7    Creatinine 0.57 - 1.00 mg/dL 6.64  4.03  4.74   BUN/Creat Ratio 12 - 28 14  10  6    Sodium 134 - 144 mmol/L 145  147  144   Potassium 3.5 - 5.2 mmol/L 3.7  3.4  3.5   Chloride 96 - 106 mmol/L 102  102  104   CO2 20 - 29 mmol/L 26  27  27    Calcium 8.7 - 10.3 mg/dL 9.5  9.8  9.3     DEPRESSION Taking Duloxetine and Buspar. Mood status: stable Satisfied with current treatment?: yes Symptom severity: moderate  Duration of current treatment :  chronic Side effects: no Medication compliance: good compliance Psychotherapy/counseling: none Depressed mood: occasional Anxious mood: occasional Anhedonia: no Significant weight loss or gain: no Insomnia: none Fatigue: sometimes Feelings of worthlessness or guilt: no Impaired concentration/indecisiveness: no Suicidal ideations: no Hopelessness: no Crying spells: no    07/15/2023   10:44 AM 05/18/2023   11:11 AM 04/13/2023   10:21 AM 02/24/2023   10:40 AM 02/22/2023   11:04 AM  Depression screen PHQ 2/9  Decreased Interest 0 0 0 0 0  Down, Depressed, Hopeless 0 0 0 0 0  PHQ - 2 Score 0 0 0 0 0  Altered sleeping 1 0 1 1 1   Tired, decreased energy 1 0 1 1 1   Change in appetite 0 0 0 0 0  Feeling bad or failure about yourself  0 0 0 0 0  Trouble concentrating 0 0 0 0 0  Moving slowly or fidgety/restless 0 0 0 0 0  Suicidal thoughts 0 0 0 0 0  PHQ-9 Score 2 0 2 2 2   Difficult doing work/chores Not difficult at all Not difficult at all Not  difficult at all Not difficult at all Not difficult at all       07/15/2023   10:44 AM 05/18/2023   11:12 AM 04/13/2023   10:21 AM 02/24/2023   10:41 AM  GAD 7 : Generalized Anxiety Score  Nervous, Anxious, on Edge 1 0 1 1  Control/stop worrying 0 0 0 0  Worry too much - different things 1 0 1 1  Trouble relaxing 0 0 0 0  Restless 0 0 0 0  Easily annoyed or irritable 1 0 0 0  Afraid - awful might happen 0 0 0 0  Total GAD 7 Score 3 0 2 2  Anxiety Difficulty Not difficult at all Not difficult at all Not difficult at all Not difficult at all   Relevant past medical, surgical, family and social history reviewed and updated as indicated. Interim medical history since our last visit reviewed. Allergies and medications reviewed and updated.  Review of Systems  Constitutional:  Negative for activity change, appetite change, diaphoresis, fatigue and fever.  Respiratory:  Positive for shortness of breath (occasional). Negative for cough, chest tightness and wheezing.   Cardiovascular:  Negative for chest pain, palpitations and leg swelling.  Gastrointestinal: Negative.   Endocrine: Negative for cold intolerance, heat intolerance, polydipsia, polyphagia and polyuria.  Neurological: Negative.   Psychiatric/Behavioral: Negative.     Per HPI unless specifically indicated above     Objective:    BP 133/70 (BP Location: Left Arm, Cuff Size: Large)   Pulse 69   Temp 98.6 F (37 C) (Oral)   Ht 5' (1.524 m)   Wt 202 lb 3.2 oz (91.7 kg)   SpO2 95%   BMI 39.49 kg/m   Wt Readings from Last 3 Encounters:  07/15/23 202 lb 3.2 oz (91.7 kg)  05/18/23 200 lb 6.4 oz (90.9 kg)  04/13/23 204 lb 6.4 oz (92.7 kg)    Physical Exam Vitals and nursing note reviewed.  Constitutional:      General: She is awake. She is not in acute distress.    Appearance: Normal appearance. She is well-developed and well-groomed. She is obese. She is not ill-appearing or toxic-appearing.  HENT:     Head:  Normocephalic.     Right Ear: Hearing and external ear normal.     Left Ear: Hearing and external ear normal.  Eyes:     General:  Lids are normal.        Right eye: No discharge.        Left eye: No discharge.     Conjunctiva/sclera: Conjunctivae normal.     Pupils: Pupils are equal, round, and reactive to light.  Neck:     Thyroid: No thyromegaly.     Vascular: No carotid bruit.  Cardiovascular:     Rate and Rhythm: Normal rate and regular rhythm.     Heart sounds: Murmur heard.     Systolic murmur is present with a grade of 2/6.     No gallop.     Comments: Bilateral legs and ankles with no edema and good hair pattern present. Pulmonary:     Effort: Pulmonary effort is normal. No accessory muscle usage or respiratory distress.     Breath sounds: Normal breath sounds.  Abdominal:     General: Bowel sounds are normal. There is no distension.     Palpations: Abdomen is soft.     Tenderness: There is no abdominal tenderness.  Musculoskeletal:     Cervical back: Normal range of motion and neck supple.     Right lower leg: No edema.     Left lower leg: No edema.  Lymphadenopathy:     Cervical: No cervical adenopathy.  Skin:    General: Skin is warm and dry.  Neurological:     Mental Status: She is alert and oriented to person, place, and time.     Deep Tendon Reflexes: Reflexes are normal and symmetric.     Reflex Scores:      Brachioradialis reflexes are 2+ on the right side and 2+ on the left side.      Patellar reflexes are 2+ on the right side and 2+ on the left side. Psychiatric:        Attention and Perception: Attention normal.        Mood and Affect: Mood normal.        Speech: Speech normal.        Behavior: Behavior normal. Behavior is cooperative.        Thought Content: Thought content normal.    Diabetic Foot Exam - Simple   Simple Foot Form Visual Inspection See comments: Yes Sensation Testing Intact to touch and monofilament testing bilaterally:  Yes Pulse Check Posterior Tibialis and Dorsalis pulse intact bilaterally: Yes Comments Dry skin     Results for orders placed or performed in visit on 05/02/23  Basic metabolic panel   Collection Time: 05/02/23 10:25 AM  Result Value Ref Range   Glucose 109 (H) 70 - 99 mg/dL   BUN 18 8 - 27 mg/dL   Creatinine, Ser 0.98 (H) 0.57 - 1.00 mg/dL   eGFR 43 (L) >11 BJ/YNW/2.95   BUN/Creatinine Ratio 14 12 - 28   Sodium 145 (H) 134 - 144 mmol/L   Potassium 3.7 3.5 - 5.2 mmol/L   Chloride 102 96 - 106 mmol/L   CO2 26 20 - 29 mmol/L   Calcium 9.5 8.7 - 10.3 mg/dL      Assessment & Plan:   Problem List Items Addressed This Visit       Cardiovascular and Mediastinum   Aortic atherosclerosis (HCC)   Ongoing.  Noted on 07/15/20 imaging.  At this time continue statin and Eliquis daily for prevention.        Hypertension associated with diabetes (HCC)   Chronic, ongoing with BP at goal for age today.  At this time will continue  all current medications and adjust as needed.  Continue collaboration with cardiology and nephrology, recent notes and labs reviewed.  Recommend she monitor BP at least a few mornings a week at home and document.  DASH diet at home.  Labs: up to date.       Relevant Orders   Bayer DCA Hb A1c Waived   Microalbumin, Urine Waived   Paroxysmal atrial fibrillation (HCC)   Chronic, ongoing, followed by cardiology.  Continue Eliquis and Metoprolol + collaboration with cardiology team.  Rate controlled.  Continue to collaborate with [facility] Pharm[facility].  Recent notes reviewed.          Respiratory   OSA (obstructive sleep apnea)   Chronic, ongoing.  Continue 100% adherence to CPAP use at night.  Recent note from Dr. Welton Flakes reviewed.        Endocrine   CKD stage 4 due to type 2 diabetes mellitus (HCC)   Chronic, ongoing CKD 3b.  At this time reduce and renal dose medications as needed + continue collaboration with nephrology.  Labs: A1c.  Avoid NSAIDs.  Refer to diabetes  with obesity plan of care for further.  A1c stable at 5.9% today.      Relevant Orders   Bayer DCA Hb A1c Waived   Diabetes mellitus treated with injections of non-insulin medication (HCC)   Refer to diabetes with obesity plan of care.      Hyperlipidemia associated with type 2 diabetes mellitus (HCC)   Chronic, ongoing.  Continue current medication regimen and adjust as needed.  Lipid panel obtained today.       Relevant Orders   Bayer DCA Hb A1c Waived   Lipid Panel w/o Chol/HDL Ratio   Primary hyperparathyroidism (HCC)   Chronic, ongoing.  Continue collaboration with nephrology and current medication regimen. Recent labs performed by them and stable.      Type 2 diabetes mellitus with morbid obesity (HCC) - Primary   Chronic, ongoing with A1c 5.9% today, slight trend up from 5.6% and urine ALB 80 January 2024, recheck today.  Praised for ongoing success.  Discussed that she is in normal A1c range which helps to reduce long term poor outcomes. - Continue Jardiance 25 MG and Ozempic 0.5 MG weekly at this time, as seeing significant benefit with this.  Gets via assistance. - Maintain off Glimepiride due to her age and risk for hypoglycemia. Plus maintain off Metformin due to kidney health. - If poor control once at MAX doses of GLP1 and SGLT2, then will consider referral to endo, but at this time will continue to work on this in office which patient agrees with. - Check BS TID and focus on diabetic diet.    - Eye and foot exams up to date. - Flu vaccine today - Return in 3 months for A1c -- but alert provider prior to if elevations in BS >300 or <70.        Relevant Orders   Bayer DCA Hb A1c Waived   Microalbumin, Urine Waived     Genitourinary   Anemia in chronic kidney disease (CKD)   Chronic, ongoing.  Follows with nephrology.  Continue this collaboration, recent notes and labs reviewed. Labs up to date with them.        Other   Depression, major, single episode,  moderate (HCC)   Chronic, stable.   Denies SI/HI.  Continue Duloxetine at this time, which also offers benefit to chronic pain and mood, + continue Buspar which has offered her  benefit to anxiety.  Refills as needed.  Will adjust dosing or add medication as needed.        Morbid obesity (HCC)   BMI 39.49 with T2DM, HTN, CKD with ongoing weight loss with Ozempic.  Recommended eating smaller high protein, low fat meals more frequently and exercising 30 mins a day 5 times a week with a goal of 10-15lb weight loss in the next 3 months. Patient voiced their understanding and motivation to adhere to these recommendations.       Other Visit Diagnoses       Need for influenza vaccination       Relevant Orders   Flu Vaccine Trivalent High Dose (Fluad) (Completed)        Follow up plan: Return in about 3 months (around 10/12/2023) for T2DM, HTN/HLD, CKD, MOOD.

## 2023-07-15 NOTE — Assessment & Plan Note (Signed)
BMI 39.49 with T2DM, HTN, CKD with ongoing weight loss with Ozempic.  Recommended eating smaller high protein, low fat meals more frequently and exercising 30 mins a day 5 times a week with a goal of 10-15lb weight loss in the next 3 months. Patient voiced their understanding and motivation to adhere to these recommendations.

## 2023-07-15 NOTE — Assessment & Plan Note (Signed)
Chronic, ongoing with A1c 5.9% today, slight trend up from 5.6% and urine ALB 80 January 2024, recheck today.  Praised for ongoing success.  Discussed that she is in normal A1c range which helps to reduce long term poor outcomes. - Continue Jardiance 25 MG and Ozempic 0.5 MG weekly at this time, as seeing significant benefit with this.  Gets via assistance. - Maintain off Glimepiride due to her age and risk for hypoglycemia. Plus maintain off Metformin due to kidney health. - If poor control once at MAX doses of GLP1 and SGLT2, then will consider referral to endo, but at this time will continue to work on this in office which patient agrees with. - Check BS TID and focus on diabetic diet.    - Eye and foot exams up to date. - Flu vaccine today - Return in 3 months for A1c -- but alert provider prior to if elevations in BS >300 or <70.

## 2023-07-15 NOTE — Assessment & Plan Note (Signed)
Chronic, ongoing CKD 3b.  At this time reduce and renal dose medications as needed + continue collaboration with nephrology.  Labs: A1c.  Avoid NSAIDs.  Refer to diabetes with obesity plan of care for further.  A1c stable at 5.9% today.

## 2023-07-15 NOTE — Assessment & Plan Note (Signed)
Chronic, ongoing.  Continue collaboration with nephrology and current medication regimen. Recent labs performed by them and stable.

## 2023-07-15 NOTE — Assessment & Plan Note (Signed)
Chronic, ongoing.  Continue current medication regimen and adjust as needed.  Lipid panel obtained today.

## 2023-07-15 NOTE — Assessment & Plan Note (Signed)
Ongoing.  Noted on 07/15/20 imaging.  At this time continue statin and Eliquis daily for prevention.

## 2023-07-15 NOTE — Assessment & Plan Note (Signed)
Chronic, stable.   Denies SI/HI.  Continue Duloxetine at this time, which also offers benefit to chronic pain and mood, + continue Buspar which has offered her benefit to anxiety.  Refills as needed.  Will adjust dosing or add medication as needed.

## 2023-07-15 NOTE — Assessment & Plan Note (Signed)
Refer to diabetes with obesity plan of care.

## 2023-07-15 NOTE — Assessment & Plan Note (Signed)
Chronic, ongoing.  Continue 100% adherence to CPAP use at night.  Recent note from Dr. Welton Flakes reviewed.

## 2023-07-15 NOTE — Assessment & Plan Note (Signed)
Chronic, ongoing, followed by cardiology.  Continue Eliquis and Metoprolol + collaboration with cardiology team.  Rate controlled.  [facility].  Recent notes reviewed.

## 2023-07-15 NOTE — Assessment & Plan Note (Signed)
Chronic, ongoing.  Follows with nephrology.  Continue this collaboration, recent notes and labs reviewed. Labs up to date with them.

## 2023-07-16 LAB — LIPID PANEL W/O CHOL/HDL RATIO
Cholesterol, Total: 143 mg/dL (ref 100–199)
HDL: 52 mg/dL (ref >39)
LDL Chol Calc (NIH): 73 mg/dL (ref 0–99)
Triglycerides: 97 mg/dL (ref 0–149)
VLDL Cholesterol Cal: 18 mg/dL (ref 5–40)

## 2023-07-16 NOTE — Progress Notes (Signed)
Good morning crew, please let Cindy [name] know her lipid panel returned and looks great.  At this time continue all current medications.  Great job!!  Any questions? Keep being so kind!!  Thank you for allowing me to participate in your care.  I appreciate you. Kindest regards, Alany Borman

## 2023-07-19 ENCOUNTER — Ambulatory Visit: Payer: Self-pay | Admitting: *Deleted

## 2023-07-19 ENCOUNTER — Other Ambulatory Visit: Payer: Self-pay | Admitting: Nurse Practitioner

## 2023-07-19 NOTE — Patient Outreach (Signed)
  Care Coordination   Follow Up Visit Note   07/20/2023 Name: Cindy [name] MRN: [id number] DOB: Feb 25, 1951  Cindy [name] is a 73 y.o. year old female who sees [place], Corrie Dandy T, NP for primary care. I spoke with  [name] by phone today.  What matters to the patients health and wellness today?  Patient report doing well, expresses gratitude about getting insurance and network providers figured out as she did not want to change providers.  Medical conditions currently managed, denies urgent concerns.     Goals Addressed             This Visit's Progress    Effective management of chornic medical conditions   On track    Interventions Today    Flowsheet Row Most Recent Value  Chronic Disease   Chronic disease during today's visit Diabetes, Congestive Heart Failure (CHF), Hypertension (HTN)  General Interventions   General Interventions Discussed/Reviewed General Interventions Reviewed, Doctor Visits, Labs  Labs Hgb A1c every 3 months  [A1C below goal]  Doctor Visits Discussed/Reviewed Doctor Visits Reviewed, PCP, Specialist  [upcoming: Echo/ECG 3/4, pharmacy 3/5, cardiology 3/19, and nephrology 4/2]  PCP/Specialist Visits Compliance with follow-up visit  Education Interventions   Education Provided Provided Education  Provided Verbal Education On Blood Sugar Monitoring, Labs, Medication, When to see the doctor  [Continues to monitor blood sugar, today 108. Report BP is "good." Meds reviewed, reminded to take daily as instructed.]              SDOH assessments and interventions completed:  No     Care Coordination Interventions:  Yes, provided   Follow up plan: Follow up call scheduled for 4/3    Encounter Outcome:  Patient Visit Completed   Rodney Langton, RN, MSN, CCM [place]  [facility], [facility] RN Care Coordinator Direct Dial: [phone] / Main [phone] Fax [phone] Email:  [email or web address]: [email or web address]

## 2023-07-19 NOTE — Telephone Encounter (Signed)
Requested medication (s) are due for refill today: Yes  Requested medication (s) are on the active medication list: Yes  Last refill:  07/15/23  Future visit scheduled: Yes  Notes to clinic:  Unable to refill per protocol, cannot delegate.      Requested Prescriptions  Pending Prescriptions Disp Refills   tiZANidine (ZANAFLEX) 2 MG tablet [Pharmacy Med Name: TIZANIDINE 2MG  TABLETS] 30 tablet 0    Sig: TAKE 1 TABLET(2 MG) BY MOUTH EVERY 6 HOURS AS NEEDED FOR MUSCLE SPASMS     Not Delegated - Cardiovascular:  Alpha-2 Agonists - tizanidine Failed - 07/19/2023  4:47 PM      Failed - This refill cannot be delegated      Passed - Valid encounter within last 6 months    Recent Outpatient Visits           2 months ago Hypertension associated with diabetes (HCC)   [place] [facility] [place], Ogema T, NP   3 months ago Type 2 diabetes mellitus with morbid obesity (HCC)   [place] [facility] [place], Corrie Dandy T, NP   4 months ago Dyspnea on exertion   [facility] Jackolyn Confer, MD   4 months ago Hypertension associated with diabetes [facility])   [facility] Jackolyn Confer, MD   6 months ago Type 2 diabetes mellitus with morbid obesity (HCC)   [place] [facility] [place], Dorie Rank, NP       Future Appointments             In 3 months Cann[place], Dorie Rank, NP  [facility]

## 2023-07-20 NOTE — Patient Instructions (Signed)
Visit Information  Thank you for taking time to visit with me today. Please don't hesitate to contact me if I can be of assistance to you before our next scheduled telephone appointment.  Following are the goals we discussed today:  Continue with monitoring blood pressure and blood sugars daily. Notify MD if you are unable to keep appointment for EKG and Echo.   Our next appointment is by telephone on 4/3  Please call the care guide team at [phone] if you need to cancel or reschedule your appointment.   Please call the Suicide and Crisis Lifeline: 988 call the [place] National Suicide Prevention Lifeline: 870 763 3572 or TTY: 432-056-6028 TTY 864-483-3671) to talk to a trained counselor call 1-800-273-TALK (toll free, 24 hour hotline) call 911 if you are experiencing a Mental Health or Behavioral Health Crisis or need someone to talk to.  Patient verbalizes understanding of instructions and care plan provided today and agrees to view in MyChart. Active MyChart status and patient understanding of how to access instructions and care plan via MyChart confirmed with patient.     The patient has been provided with contact information for the care management team and has been advised to call with any health related questions or concerns.   Rodney Langton, RN, MSN, CCM [facility], [facility] RN Care Coordinator Direct Dial: [phone] / Main [phone] Fax [phone] Email: [email or web address]: [email or web address]

## 2023-08-03 ENCOUNTER — Other Ambulatory Visit: Payer: Self-pay

## 2023-08-03 NOTE — Progress Notes (Signed)
   08/03/2023  Patient ID: Cindy [name], female   DOB: May 23, 1951, 73 y.o.   MRN: [id number]  Subjective/objective Telephone visit to follow-up on management of type 2 diabetes as well as medication access/affordability  Diabetes management plan -Current medications: Jardiance 25 mg daily, Ozempic 0.5 mg weekly -Patient is enrolled in [employer] patient assistance program for Ozempic, and she endorses currently having 2 pens of medication on hand -Currently enrolled in [employer] patient assistance program for Jardiance, and she endorses being down to approximately a 2-week supply of this medication -Last A1c was 5.9%  Medication Access -Patient was previously enrolled in [employer] patient assistance program for Eliquis 5 mg twice daily, but enrollment ended 05/31/2023. Patient will not be eligible for reenrollment until 3% of total household income has been spent out-of-pocket on medications for this calendar year. -Patient currently has 1 bottle of Eliquis 5 mg on hand  Assessment/plan  Diabetes management plan -Currently controlled -Continue current regimen, regular monitoring of blood glucose, and regular follow-up with PCP and PharmD -Contacted patient assistance program for Jardiance, and may have been a 90-day refill in process that the patient should receive in the next 7 to 10 days  Medication Access -Will contact insurance to see what co-pay patient can expect for Eliquis to determine if this will be affordable, as she will not likely meet eligibility criteria for the program at this time -If Eliquis will not be affordable, we could check on patient co-pay for dabigatran his cardiologist and PCP were in agreement with alternative therapy  Follow-up: 1 month  [name], PharmD, DPLA

## 2023-08-18 ENCOUNTER — Telehealth: Payer: Self-pay

## 2023-08-18 NOTE — Progress Notes (Signed)
   08/18/2023  Patient ID: Cindy [name], female   DOB: Oct 28, 1950, 73 y.o.   MRN: [id number]  Received voicemail from patient stating she is now down to 5 tablets of Jardiance 25mg  and has not yet received refill from [employer] PAP.  Contacted company, and they state the mediation was delivered 3/5 for #90.  I contacted patient to verify she has not received the medication, and she has not seen a delivery.  Provided phone number for [facility], so patient could notify company and see about replacement.  [name], PharmD, DPLA

## 2023-08-19 NOTE — Progress Notes (Signed)
   08/19/2023  Patient ID: [name], female   DOB: 02-21-1951, 73 y.o.   MRN: [id number]  Ms. Newsham returned my call to inform me that [facility] C[employer] representative informed her the Jardiance 25mg  refill actually had not shipped out yet, but it is in processing and she should receive it by the end of next week.  Discussed Eliquis PAP renewal, because she is down to a 1 month supply of medication on hand.  To be eligible, the program requires proof of [place] Medicare Extra Help denial, which we have.  They also require 3% of HHI to have been spent OOP on medications.  Patient is going to request pharmacy print out at the end of March and bring to [facility] for me to submit along with her application.    [name], PharmD, DPLA

## 2023-08-29 ENCOUNTER — Telehealth: Payer: Self-pay

## 2023-08-29 NOTE — Progress Notes (Signed)
   08/29/2023  Patient ID: Cindy [name], female   DOB: [birth year], 73 y.o.   MRN: [id number]  Received a voicemail from patient stating she still has not received Jardiance 25mg  and has been out of the medication for a few days.  Contacted [employer] PAP, and medication was filled today.  Has not been picked up yet by [employer], but I was provided with a tracking number of [phone].  I will monitor status, and inform patient of estimated shipping date once known.  [name], PharmD, DPLA

## 2023-08-30 ENCOUNTER — Other Ambulatory Visit: Payer: Self-pay

## 2023-08-30 NOTE — Progress Notes (Signed)
   08/30/2023  Patient ID: Cindy [name], female   DOB: 1950-08-08, 73 y.o.   MRN: [id number]  Jardiance shipment has been picked up by [employer] and will be delivered to patient by Friday at 7pm per tracking information online.  Contacted Ms. Kinnamon to make her aware.  [name], PharmD, DPLA

## 2023-09-01 ENCOUNTER — Ambulatory Visit: Payer: Self-pay | Admitting: *Deleted

## 2023-09-01 NOTE — Patient Outreach (Signed)
 Care Coordination   Follow Up Visit Note   09/01/2023 Name: Cindy [name] MRN: [id number] DOB: 06/07/1950  Cindy [name] is a 73 y.o. year old female who sees [place], Corrie Dandy T, NP for primary care. I spoke with  Cindy [name] by phone today.  What matters to the patients health and wellness today?  Patient report she is doing well, follow up with cardiology done after recent echo and ECG, no changes to plan of care.  Denies any urgent concerns, encouraged to contact this care manager with questions.      Goals Addressed             This Visit's Progress    COMPLETED: Effective management of chornic medical conditions   On track    Interventions Today    Flowsheet Row Most Recent Value  Chronic Disease   Chronic disease during today's visit Diabetes, Hypertension (HTN)  General Interventions   General Interventions Discussed/Reviewed Doctor Visits, Durable Medical Equipment (DME)  Doctor Visits Discussed/Reviewed Annual Wellness Visits, PCP, Specialist, Doctor Visits Reviewed  [upcoming with PCP on 5/21, nephrology 8/20, AWV 9/9, and cardiology 9/12]  Durable Medical Equipment (DME) BP Cuff, Glucomoter  PCP/Specialist Visits Compliance with follow-up visit  Education Interventions   Education Provided Provided Education  Provided Verbal Education On Blood Sugar Monitoring, When to see the doctor, Medication, Labs  [medications reviewed, no changes. Waiting for Jardiance to be delivered. Monitoring BP and glucose daily, report all have been stable (BP 120s/70s, glucose 90-low 100s)]  Labs Reviewed Hgb A1c  [A1C well below goal of 7]              SDOH assessments and interventions completed:  No     Care Coordination Interventions:  Yes, provided   Follow up plan: No further intervention required.   Encounter Outcome:  Patient Visit Completed   Rodney Langton, RN, MSN, CCM [place]  [facility] RN Care Coordinator Direct  Dial: [phone] / Main [phone] Fax [phone] Email: [email or web address]: [email or web address]

## 2023-09-07 ENCOUNTER — Other Ambulatory Visit: Payer: Self-pay

## 2023-09-07 NOTE — Progress Notes (Signed)
    09/07/2023  Patient ID: [name], female   DOB: December 16, 1950, 74 y.o.   MRN: [id number]  Patient brought in report from pharmacy showing total paid out of pocket for prescriptions so far this year.  In order to be eligible for Eliquis PAP 3% of total household income must be paid OOP on medications.  Patient is still approximately $740 away from meeting this requirement.  Called her insurance, and they state Eliquis copay will be $297 ($250 deductible + $47 copay)- once deductible satisfied with first fill, copay will be $47 moving forward.  Checked on the cost of generic Pradaxa, and it is more expensive due to being non-preferred on formulary.  Contacted patient to discuss, and she endorses she believes she an afford to continue Eliquis at this time.  Currently has around 3 week supply of medication on hand.  Pending prescription for PCP to sign.  [name], PharmD, DPLA

## 2023-09-07 NOTE — Progress Notes (Deleted)
   09/07/2023  Patient ID: Cindy [name], female   DOB: 10/14/1950, 73 y.o.   MRN: [id number]  3% OOP = $873 to qualify for Eliquis PAP Total as of 08/31/2023 per pharmacy report= $136.54 Eliquis copay will be $297 ($250 deductible + $47 copay)- once deductible satisfied with first fill, copay will be $47 moving forward Pradaxa would be $105.91, then 45% coinsurance because not preferred by plan

## 2023-09-08 MED ORDER — APIXABAN 5 MG PO TABS: 5.0000 mg | ORAL_TABLET | Freq: Two times a day (BID) | ORAL | 11 refills | Status: AC

## 2023-09-17 ENCOUNTER — Other Ambulatory Visit: Payer: Self-pay | Admitting: Nurse Practitioner

## 2023-09-19 NOTE — Telephone Encounter (Signed)
 Requested Prescriptions  Pending Prescriptions Disp Refills   DULoxetine  (CYMBALTA ) 60 MG capsule [Pharmacy Med Name: DULOXETINE  DR 60MG  CAPSULES] 90 capsule 0    Sig: TAKE 1 CAPSULE(60 MG) BY MOUTH DAILY     Psychiatry: Antidepressants - SNRI - duloxetine  Failed - 09/19/2023 10:03 AM      Failed - Cr in normal range and within 360 days    Creatinine  Date Value Ref Range Status  01/24/2014 0.86 0.60 - 1.30 mg/dL Final   Creatinine, Ser  Date Value Ref Range Status  05/02/2023 1.31 (H) 0.57 - 1.00 mg/dL Final         Passed - eGFR is 30 or above and within 360 days    EGFR (African American)  Date Value Ref Range Status  01/24/2014 >60  Final   GFR calc Af Amer  Date Value Ref Range Status  03/19/2017 >60 >60 mL/min Final    Comment:    (NOTE) The eGFR has been calculated using the CKD EPI equation. This calculation has not been validated in all clinical situations. eGFR's persistently <60 mL/min signify possible Chronic Kidney Disease.    EGFR (Non-African Amer.)  Date Value Ref Range Status  01/24/2014 >60  Final    Comment:    eGFR values <24mL/min/1.73 m2 may be an indication of chronic kidney disease (CKD). Calculated eGFR is useful in patients with stable renal function. The eGFR calculation will not be reliable in acutely ill patients when serum creatinine is changing rapidly. It is not useful in  patients on dialysis. The eGFR calculation may not be applicable to patients at the low and high extremes of body sizes, pregnant women, and vegetarians.    GFR, Estimated  Date Value Ref Range Status  04/05/2022 33 (L) >60 mL/min Final    Comment:    (NOTE) Calculated using the CKD-EPI Creatinine Equation (2021)    eGFR  Date Value Ref Range Status  05/02/2023 43 (L) >59 mL/min/1.73 Final         Passed - Completed PHQ-2 or PHQ-9 in the last 360 days      Passed - Last BP in normal range    BP Readings from Last 1 Encounters:  07/15/23 133/70          Passed - Valid encounter within last 6 months    Recent Outpatient Visits           2 months ago Type 2 diabetes mellitus with morbid obesity (HCC)   [place] [facility] [place], [place] T, NP       Future Appointments             In 1 month Cable, Jolene T, NP [facility] [facility], [facility]            Refused Prescriptions Disp Refills   atorvastatin  (LIPITOR) 40 MG tablet [Pharmacy Med Name: ATORVASTATIN  40MG  TABLETS] 90 tablet 1    Sig: TAKE 1 TABLET(40 MG) BY MOUTH DAILY     Cardiovascular:  Antilipid - Statins Failed - 09/19/2023 10:03 AM      Failed - Lipid Panel in normal range within the last 12 months    Cholesterol, Total  Date Value Ref Range Status  07/15/2023 143 100 - 199 mg/dL Final   LDL Chol Calc (NIH)  Date Value Ref Range Status  07/15/2023 73 0 - 99 mg/dL Final   HDL  Date Value Ref Range Status  07/15/2023 52 >39 mg/dL Final   Triglycerides  Date Value Ref Range Status  07/15/2023 97 0 - 149 mg/dL Final         Passed - Patient is not pregnant      Passed - Valid encounter within last 12 months    Recent Outpatient Visits           2 months ago Type 2 diabetes mellitus with morbid obesity (HCC)   [place] [facility] [place], Lavelle Posey, NP       Future Appointments             In 1 month Cannady, Jolene T, NP [facility], [facility]

## 2023-09-21 ENCOUNTER — Other Ambulatory Visit: Payer: Self-pay | Admitting: Nurse Practitioner

## 2023-09-22 NOTE — Telephone Encounter (Signed)
 Unable to refill per protocol, Rx expired discontinued 04/14/23, dose change.  Requested Prescriptions  Pending Prescriptions Disp Refills   atorvastatin  (LIPITOR) 40 MG tablet [Pharmacy Med Name: ATORVASTATIN  40MG  TABLETS] 90 tablet 1    Sig: TAKE 1 TABLET(40 MG) BY MOUTH DAILY     Cardiovascular:  Antilipid - Statins Failed - 09/22/2023 11:32 AM      Failed - Lipid Panel in normal range within the last 12 months    Cholesterol, Total  Date Value Ref Range Status  07/15/2023 143 100 - 199 mg/dL Final   LDL Chol Calc (NIH)  Date Value Ref Range Status  07/15/2023 73 0 - 99 mg/dL Final   HDL  Date Value Ref Range Status  07/15/2023 52 >39 mg/dL Final   Triglycerides  Date Value Ref Range Status  07/15/2023 97 0 - 149 mg/dL Final         Passed - Patient is not pregnant      Passed - Valid encounter within last 12 months    Recent Outpatient Visits           2 months ago Type 2 diabetes mellitus with morbid obesity (HCC)   [place] [facility] [place], Lavelle Posey, NP       Future Appointments             In 3 weeks Cannady, Jolene T, NP [facility], [facility]

## 2023-09-26 ENCOUNTER — Other Ambulatory Visit: Payer: Self-pay | Admitting: Nurse Practitioner

## 2023-09-26 NOTE — Telephone Encounter (Signed)
 Copied from CRM [id number]. Topic: Clinical - Medication Refill >> Sep 26, 2023  9:16 AM Crispin Dolphin wrote: Most Recent Primary Care Visit:  Provider: CANNADY, JOLENE T  Department: [facility]  Visit Type: OFFICE VISIT  Date: 07/15/2023  Medication:  atorvastatin  (LIPITOR) 80 MG Patient also request Test strips for glucose Monitoring. Not listed in check. Accu Check   Has the patient contacted their pharmacy? Yes (Agent: If no, request that the patient contact the pharmacy for the refill. If patient does not wish to contact the pharmacy document the reason why and proceed with request.) (Agent: If yes, when and what did the pharmacy advise?) requested no response   Is this the correct pharmacy for this prescription? Yes If no, delete pharmacy and type the correct one.  This is the patient's preferred pharmacy:  [id number] [place] - [address] AT [facility] [address] [place] [address] Phone: [phone] Fax: [phone]   Has the prescription been filled recently? Yes 06/21/2023 90 days   Is the patient out of the medication? Yes  Has the patient been seen for an appointment in the last year OR does the patient have an upcoming appointment? Yes  Can we respond through MyChart? No  Agent: Please be advised that Rx refills may take up to 3 business days. We ask that you follow-up with your pharmacy.

## 2023-09-28 NOTE — Telephone Encounter (Signed)
 [facility] called and spoke to [place], [facility] about the refill(s) atrovastatin requested. Advised it was sent on 04/14/23 #90/4 refill(s). He says he can refill it for her.

## 2023-10-11 ENCOUNTER — Telehealth: Payer: Self-pay

## 2023-10-11 NOTE — Telephone Encounter (Signed)
 Patient aware that her shipment for Ozempic  from [employer] has been received. She is aware that he should pick up in the next 2 weeks.

## 2023-10-12 ENCOUNTER — Ambulatory Visit: Payer: Medicare HMO | Admitting: Nurse Practitioner

## 2023-10-16 NOTE — Patient Instructions (Signed)
Be Involved in Caring For Your Health:  Taking Medications When medications are taken as directed, they can greatly improve your health. But if they are not taken as prescribed, they may not work. In some cases, not taking them correctly can be harmful. To help ensure your treatment remains effective and safe, understand your medications and how to take them. Bring your medications to each visit for review by your provider.  Your lab results, notes, and after visit summary will be available on My Chart. We strongly encourage you to use this feature. If lab results are abnormal the clinic will contact you with the appropriate steps. If the clinic does not contact you assume the results are satisfactory. You can always view your results on My Chart. If you have questions regarding your health or results, please contact the clinic during office hours. You can also ask questions on My Chart.  We at [facility] are grateful that you chose [place] to provide your care. We strive to provide evidence-based and compassionate care and are always looking for feedback. If you get a survey from the clinic please complete this so we can hear your opinions.  Diabetes Mellitus and Exercise Regular exercise is important for your health, especially if you have diabetes mellitus. Exercise is not just about losing weight. It can also help you increase muscle strength and bone density and reduce body fat and stress. This can help your level of endurance and make you more fit and flexible. Why should I exercise if I have diabetes? Exercise has many benefits for people with diabetes. It can: Help lower and control your blood sugar (glucose). Help your body respond better and become more sensitive to the hormone insulin. Reduce how much insulin your body needs. Lower your risk for heart disease by: Lowering how much "bad" cholesterol and triglycerides you have in your body. Increasing how much "good" cholesterol  you have in your body. Lowering your blood pressure. Lowering your blood glucose levels. What is my activity plan? Your health care provider or an expert trained in diabetes care (certified diabetes educator) can help you make an activity plan. This plan can help you find the type of exercise that works for you. It may also tell you how often to exercise and for how long. Be sure to: Get at least 150 minutes of medium-intensity or high-intensity exercise each week. This may involve brisk walking, biking, or water aerobics. Do stretching and strengthening exercises at least 2 times a week. This may involve yoga or weight lifting. Spread out your activity over at least 3 days of the week. Get some form of physical activity each day. Do not go more than 2 days in a row without some kind of activity. Avoid being inactive for more than 30 minutes at a time. Take frequent breaks to walk or stretch. Choose activities that you enjoy. Set goals that you know you can accomplish. Start slowly and increase the intensity of your exercise over time. How do I manage my diabetes during exercise?  Monitor your blood glucose Check your blood glucose before and after you exercise. If your blood glucose is 240 mg/dL (40.9 mmol/L) or higher before you exercise, check your urine for ketones. These are chemicals created by the liver. If you have ketones in your urine, do not exercise until your blood glucose returns to normal. If your blood glucose is 100 mg/dL (5.6 mmol/L) or lower, eat a snack that has 15-20 grams of carbohydrate in  it. Check your blood glucose 15 minutes after the snack to make sure that your level is above 100 mg/dL (5.6 mmol/L) before you start to exercise. Your risk for low blood glucose (hypoglycemia) goes up during and after exercise. Know the symptoms of this condition and how to treat it. Follow these instructions at home: Keep a carbohydrate snack on hand for use before, during, and after  exercise. This can help prevent or treat hypoglycemia. Avoid injecting insulin into parts of your body that are going to be used during exercise. This may include: Your arms, when you are going to play tennis. Your legs, when you are about to go jogging. Keep track of your exercise habits. This can help you and your health care provider watch and adjust your activity plan. Write down: What you eat before and after you exercise. Blood glucose levels before and after you exercise. The type and amount of exercise you do. Talk to your health care provider before you start a new activity. They may need to: Make sure that the activity is safe for you. Adjust your insulin, other medicines, and food that you eat. Drink water while you exercise. This can stop you from losing too much water (dehydration). It can also prevent problems caused by having a lot of heat in your body (heat stroke). Where to find more information American Diabetes Association: diabetes.org Association of Diabetes Care & Education Specialists: diabeteseducator.org This information is not intended to replace advice given to you by your health care provider. Make sure you discuss any questions you have with your health care provider. Document Revised: 11/04/2021 Document Reviewed: 11/04/2021 Elsevier Patient Education  2024 [employer].

## 2023-10-19 ENCOUNTER — Ambulatory Visit (INDEPENDENT_AMBULATORY_CARE_PROVIDER_SITE_OTHER): Payer: Medicare HMO | Admitting: Nurse Practitioner

## 2023-10-19 ENCOUNTER — Encounter: Payer: Self-pay | Admitting: Nurse Practitioner

## 2023-10-19 DIAGNOSIS — D631 Anemia in chronic kidney disease: Secondary | ICD-10-CM

## 2023-10-19 DIAGNOSIS — E1122 Type 2 diabetes mellitus with diabetic chronic kidney disease: Secondary | ICD-10-CM

## 2023-10-19 DIAGNOSIS — E1169 Type 2 diabetes mellitus with other specified complication: Secondary | ICD-10-CM | POA: Diagnosis not present

## 2023-10-19 DIAGNOSIS — F321 Major depressive disorder, single episode, moderate: Secondary | ICD-10-CM

## 2023-10-19 DIAGNOSIS — E1159 Type 2 diabetes mellitus with other circulatory complications: Secondary | ICD-10-CM | POA: Diagnosis not present

## 2023-10-19 DIAGNOSIS — I152 Hypertension secondary to endocrine disorders: Secondary | ICD-10-CM

## 2023-10-19 DIAGNOSIS — E119 Type 2 diabetes mellitus without complications: Secondary | ICD-10-CM

## 2023-10-19 DIAGNOSIS — N184 Chronic kidney disease, stage 4 (severe): Secondary | ICD-10-CM

## 2023-10-19 DIAGNOSIS — I700 Atherosclerosis of aorta: Secondary | ICD-10-CM

## 2023-10-19 DIAGNOSIS — D6869 Other thrombophilia: Secondary | ICD-10-CM

## 2023-10-19 DIAGNOSIS — N1832 Chronic kidney disease, stage 3b: Secondary | ICD-10-CM

## 2023-10-19 DIAGNOSIS — E210 Primary hyperparathyroidism: Secondary | ICD-10-CM

## 2023-10-19 DIAGNOSIS — I480 Paroxysmal atrial fibrillation: Secondary | ICD-10-CM

## 2023-10-19 LAB — BAYER DCA HB A1C WAIVED: HB A1C (BAYER DCA - WAIVED): 5.5 % (ref 4.8–5.6)

## 2023-10-19 MED ORDER — GLUCOSE BLOOD VI STRP: ORAL_STRIP | 3 refills | Status: AC

## 2023-10-19 NOTE — Assessment & Plan Note (Signed)
 Chronic, stable.   Denies SI/HI.  Continue Duloxetine at this time, which also offers benefit to chronic pain and mood, + continue Buspar which has offered her benefit to anxiety.  Refills as needed.  Will adjust dosing or add medication as needed.

## 2023-10-19 NOTE — Assessment & Plan Note (Signed)
 Chronic, ongoing.  Continue collaboration with nephrology and current medication regimen. Recent labs performed by them and stable.

## 2023-10-19 NOTE — Assessment & Plan Note (Signed)
Related to a-fib and long term Eliquis.  Monitor CBC regularly and monitor skin for bruising or breakdown, notify provider if present. 

## 2023-10-19 NOTE — Assessment & Plan Note (Signed)
 Refer to diabetes with obesity plan of care.

## 2023-10-19 NOTE — Assessment & Plan Note (Signed)
 Ongoing.  Noted on 07/15/20 imaging.  At this time continue statin and Eliquis daily for prevention.

## 2023-10-19 NOTE — Assessment & Plan Note (Signed)
 Chronic, ongoing with A1c 5.5% today, slight trend down from 5.9% and urine ALB 150 February 2025.  Praised for ongoing success.  Discussed that she is in normal A1c range which helps to reduce long term poor outcomes. - Continue Jardiance  25 MG and Ozempic  0.5 MG weekly at this time, as seeing significant benefit with this.  Gets via assistance. - Maintain off Glimepiride due to her age and risk for hypoglycemia. Plus maintain off Metformin  due to kidney health. - If poor control once at MAX doses of GLP1 and SGLT2, then will consider referral to endo, but at this time will continue to work on this in office which patient agrees with. - Check BS TID and focus on diabetic diet.    - Eye and foot exams up to date. - Flu vaccine today - Return in 3 months for A1c -- but alert provider prior to if elevations in BS >300 or <70.

## 2023-10-19 NOTE — Assessment & Plan Note (Signed)
 Chronic, ongoing CKD 3b.  At this time reduce and renal dose medications as needed + continue collaboration with nephrology.  Labs: A1c.  Avoid NSAIDs.  Refer to diabetes with obesity plan of care for further.  A1c stable at 5.5% today.

## 2023-10-19 NOTE — Assessment & Plan Note (Signed)
 Chronic, ongoing with BP at goal for age today.  At this time will continue all current medications and adjust as needed.  Continue collaboration with cardiology and nephrology, recent notes and labs reviewed.  Recommend she monitor BP at least a few mornings a week at home and document.  DASH diet at home.  Labs: up to date with nephrology.

## 2023-10-19 NOTE — Assessment & Plan Note (Signed)
 Chronic, ongoing.  Continue current medication regimen and adjust as needed.  Lipid panel obtained today.

## 2023-10-19 NOTE — Progress Notes (Signed)
 BP 135/70   Pulse 66   Temp 98 F (36.7 C) (Oral)   Ht 5' (1.524 m)   Wt 206 lb 12.8 oz (93.8 kg)   SpO2 96%   BMI 40.39 kg/m    Subjective:    Patient ID: Cindy Cindy [name], female    DOB: Aug 15, 1950, 73 y.o.   MRN: [id number]  HPI: Cindy Cindy [name] is a 73 y.o. female  Chief Complaint  Patient presents with   Chronic Kidney Disease   Depression   Diabetes   Hyperlipidemia   Hypertension   DIABETES A1c in February was 5.9% with Jardiance  25 MG daily and Ozempic  0.5 MG on board. [facility] involved for cost assist with Jardiance , Eliquis , and Ozempic  -- obtains through programs.  Does have some constipation with Ozempic , takes Metamucil and Senna S. Hypoglycemic episodes:no Polydipsia/polyuria: no Visual disturbance: no Chest pain: no Paresthesias: no Glucose Monitoring: yes             Accucheck frequency: Daily             Fasting glucose: 103 this morning - average 90 to 120             Post prandial:             Evening:             Before meals: Taking Insulin ?: no             Long acting insulin :             Short acting insulin : Blood Pressure Monitoring: a few times a week Retinal Examination: Up To Date -- [facility] Foot Exam: Up to Date  Pneumovax: Up To Date  Influenza: Up To Date Aspirin : no    HYPERTENSION / HYPERLIPIDEMIA/A-FIB/HF Takes Olmesartan , Metoprolol , Lasix , Spironolactone , Amlodipine , Atorvastatin .  Takes Eliquis  (gets via assistance). Echo on 03/15/23 noted LVEF >70% and moderate pericardial effusion. Last saw cardiology on 08/17/23 with no changes made.  Last saw pulmonary 10/18/22, to return as needed. Satisfied with current treatment? yes Duration of hypertension: chronic BP monitoring frequency: three times a week BP range: <130/80 BP medication side effects: no Duration of hyperlipidemia: chronic Cholesterol medication side effects: no Cholesterol supplements: none Medication compliance: good compliance Aspirin : no Recent  stressors: no Recurrent headaches: no Visual changes: no Palpitations: occasional  Dyspnea: with vigorous activity at times Chest pain: no Lower extremity edema: no Dizzy/lightheaded: occasional if changing position  CHRONIC KIDNEY DISEASE (CKD 3b) Saw nephrology last 08/31/23 - CRT 1.44, eGFR 38, PTHH 89, H/H 11.6/35.7. CKD status: stable Medications renally dose: yes Previous renal evaluation: no Pneumovax:  Up To Date Influenza Vaccine: Up To Date    Latest Ref Rng & Units 05/02/2023   10:25 AM 04/13/2023   10:23 AM 02/22/2023   11:46 AM  BMP  Glucose 70 - 99 mg/dL 621  308  657   BUN 8 - 27 mg/dL 18  12  7    Creatinine 0.57 - 1.00 mg/dL 8.46  9.62  9.52   BUN/Creat Ratio 12 - 28 14  10  6    Sodium 134 - 144 mmol/L 145  147  144   Potassium 3.5 - 5.2 mmol/L 3.7  3.4  3.5   Chloride 96 - 106 mmol/L 102  102  104   CO2 20 - 29 mmol/L 26  27  27    Calcium  8.7 - 10.3 mg/dL 9.5  9.8  9.3     DEPRESSION  Taking Duloxetine  and Buspar . Mood status: stable Satisfied with current treatment?: yes Symptom severity: moderate  Duration of current treatment : chronic Side effects: no Medication compliance: good compliance Psychotherapy/counseling: none Depressed mood: sometimes Anxious mood: sometimes Anhedonia: no Significant weight loss or Cindy [name]: no Insomnia: none Fatigue: occasional Feelings of worthlessness or guilt: no Impaired concentration/indecisiveness: no Suicidal ideations: no Hopelessness: no Crying spells: no    10/19/2023    9:55 AM 07/15/2023   10:44 AM 05/18/2023   11:11 AM 04/13/2023   10:21 AM 02/24/2023   10:40 AM  Depression screen PHQ 2/9  Decreased Interest 1 0 0 0 0  Down, Depressed, Hopeless 1 0 0 0 0  PHQ - 2 Score 2 0 0 0 0  Altered sleeping 1 1 0 1 1  Tired, decreased energy 1 1 0 1 1  Change in appetite 0 0 0 0 0  Feeling bad or failure about yourself  0 0 0 0 0  Trouble concentrating 0 0 0 0 0  Moving slowly or fidgety/restless 0 0 0 0 0   Suicidal thoughts 0 0 0 0 0  PHQ-9 Score 4 2 0 2 2  Difficult doing work/chores Not difficult at all Not difficult at all Not difficult at all Not difficult at all Not difficult at all       10/19/2023    9:56 AM 07/15/2023   10:44 AM 05/18/2023   11:12 AM 04/13/2023   10:21 AM  GAD 7 : Generalized Anxiety Score  Nervous, Anxious, on Edge 1 1 0 1  Control/stop worrying 0 0 0 0  Worry too much - different things 1 1 0 1  Trouble relaxing 0 0 0 0  Restless 0 0 0 0  Easily annoyed or irritable 1 1 0 0  Afraid - awful might happen 0 0 0 0  Total GAD 7 Score 3 3 0 2  Anxiety Difficulty Not difficult at all Not difficult at all Not difficult at all Not difficult at all   Relevant past medical, surgical, family and social history reviewed and updated as indicated. Interim medical history since our last visit reviewed. Allergies and medications reviewed and updated.  Review of Systems  Constitutional:  Negative for activity change, appetite change, diaphoresis, fatigue and fever.  Respiratory:  Positive for shortness of breath (occasional). Negative for cough, chest tightness and wheezing.   Cardiovascular:  Negative for chest pain, palpitations and leg swelling.  Gastrointestinal: Negative.   Endocrine: Negative for cold intolerance, heat intolerance, polydipsia, polyphagia and polyuria.  Neurological: Negative.   Psychiatric/Behavioral: Negative.     Per HPI unless specifically indicated above     Objective:    BP 135/70   Pulse 66   Temp 98 F (36.7 C) (Oral)   Ht 5' (1.524 m)   Wt 206 lb 12.8 oz (93.8 kg)   SpO2 96%   BMI 40.39 kg/m   Wt Readings from Last 3 Encounters:  10/19/23 206 lb 12.8 oz (93.8 kg)  07/15/23 202 lb 3.2 oz (91.7 kg)  05/18/23 200 lb 6.4 oz (90.9 kg)    Physical Exam Vitals and nursing note reviewed.  Constitutional:      General: She is awake. She is not in acute distress.    Appearance: Normal appearance. She is well-developed and  well-groomed. She is obese. She is not ill-appearing or toxic-appearing.  HENT:     Head: Normocephalic.     Right Ear: Hearing and external ear normal.  Left Ear: Hearing and external ear normal.  Eyes:     General: Lids are normal.        Right eye: No discharge.        Left eye: No discharge.     Conjunctiva/sclera: Conjunctivae normal.     Pupils: Pupils are equal, round, and reactive to light.  Neck:     Thyroid : No thyromegaly.     Vascular: No carotid bruit.  Cardiovascular:     Rate and Rhythm: Normal rate and regular rhythm.     Heart sounds: Murmur heard.     Systolic murmur is present with a grade of 2/6.     No gallop.     Comments: Bilateral legs and ankles with no edema and good hair pattern present. Pulmonary:     Effort: Pulmonary effort is normal. No accessory muscle usage or respiratory distress.     Breath sounds: Normal breath sounds.  Abdominal:     General: Bowel sounds are normal. There is no distension.     Palpations: Abdomen is soft.     Tenderness: There is no abdominal tenderness.  Musculoskeletal:     Cervical back: Normal range of motion and neck supple.     Right lower leg: No edema.     Left lower leg: No edema.  Lymphadenopathy:     Cervical: No cervical adenopathy.  Skin:    General: Skin is warm and dry.  Neurological:     Mental Status: She is alert and oriented to person, place, and time.     Deep Tendon Reflexes: Reflexes are normal and symmetric.     Reflex Scores:      Brachioradialis reflexes are 2+ on the right side and 2+ on the left side.      Patellar reflexes are 2+ on the right side and 2+ on the left side. Psychiatric:        Attention and Perception: Attention normal.        Mood and Affect: Mood normal.        Speech: Speech normal.        Behavior: Behavior normal. Behavior is cooperative.        Thought Content: Thought content normal.    Results for orders placed or performed in visit on 07/15/23  Bayer DCA Hb  A1c Waived   Collection Time: 07/15/23 10:43 AM  Result Value Ref Range   HB A1C (BAYER DCA - WAIVED) 5.9 (H) 4.8 - 5.6 %  Microalbumin, Urine Waived   Collection Time: 07/15/23 10:43 AM  Result Value Ref Range   Microalb, Ur Waived 150 (H) 0 - 19 mg/L   Creatinine, Urine Waived 100 10 - 300 mg/dL   Microalb/Creat Ratio 30-300 (H) <30 mg/g  Lipid Panel w/o Chol/HDL Ratio   Collection Time: 07/15/23 10:43 AM  Result Value Ref Range   Cholesterol, Total 143 100 - 199 mg/dL   Triglycerides 97 0 - 149 mg/dL   HDL 52 >28 mg/dL   VLDL Cholesterol Cal 18 5 - 40 mg/dL   LDL Chol Calc (NIH) 73 0 - 99 mg/dL      Assessment & Plan:   Problem List Items Addressed This Visit       Cardiovascular and Mediastinum   Paroxysmal atrial fibrillation (HCC)   Chronic, ongoing, followed by cardiology.  Continue Eliquis  and Metoprolol  + collaboration with cardiology team.  Rate controlled.  [facility].  Recent notes reviewed.  Hypertension associated with diabetes (HCC)   Chronic, ongoing with BP at goal for age today.  At this time will continue all current medications and adjust as needed.  Continue collaboration with cardiology and nephrology, recent notes and labs reviewed.  Recommend she monitor BP at least a few mornings a week at home and document.  DASH diet at home.  Labs: up to date with nephrology.       Relevant Orders   Bayer DCA Hb A1c Waived   Aortic atherosclerosis (HCC)   Ongoing.  Noted on 07/15/20 imaging.  At this time continue statin and Eliquis  daily for prevention.          Endocrine   Type 2 diabetes mellitus with morbid obesity (HCC) - Primary   Chronic, ongoing with A1c 5.5% today, slight trend down from 5.9% and urine ALB 150 February 2025.  Praised for ongoing success.  Discussed that she is in normal A1c range which helps to reduce long term poor outcomes. - Continue Jardiance  25 MG and Ozempic  0.5 MG weekly at this time, as seeing  significant benefit with this.  Gets via assistance. - Maintain off Glimepiride due to her age and risk for hypoglycemia. Plus maintain off Metformin  due to kidney health. - If poor control once at MAX doses of GLP1 and SGLT2, then will consider referral to endo, but at this time will continue to work on this in office which patient agrees with. - Check BS TID and focus on diabetic diet.    - Eye and foot exams up to date. - Flu vaccine today - Return in 3 months for A1c -- but alert provider prior to if elevations in BS >300 or <70.        Relevant Orders   Bayer DCA Hb A1c Waived   Primary hyperparathyroidism (HCC)   Chronic, ongoing.  Continue collaboration with nephrology and current medication regimen. Recent labs performed by them and stable.      Hyperlipidemia associated with type 2 diabetes mellitus (HCC)   Chronic, ongoing.  Continue current medication regimen and adjust as needed.  Lipid panel obtained today.       Relevant Orders   Bayer DCA Hb A1c Waived   Lipid Panel w/o Chol/HDL Ratio   Diabetes mellitus treated with injections of non-insulin  medication (HCC)   Refer to diabetes with obesity plan of care.      Relevant Orders   Bayer DCA Hb A1c Waived   CKD stage 4 due to type 2 diabetes mellitus (HCC)   Chronic, ongoing CKD 3b.  At this time reduce and renal dose medications as needed + continue collaboration with nephrology.  Labs: A1c.  Avoid NSAIDs.  Refer to diabetes with obesity plan of care for further.  A1c stable at 5.5% today.      Relevant Orders   Bayer DCA Hb A1c Waived     Genitourinary   Anemia in chronic kidney disease (CKD)   Chronic, ongoing.  Follows with nephrology.  Continue this collaboration, recent notes and labs reviewed. Labs up to date with them.        Hematopoietic and Hemostatic   Other thrombophilia (HCC)   Related to a-fib and long term Eliquis .  Monitor CBC regularly and monitor skin for bruising or breakdown, notify provider  if present.        Other   Morbid obesity (HCC)   BMI 40.39 with T2DM, HTN, CKD.  Recommended eating smaller high protein, low fat meals more  frequently and exercising 30 mins a day 5 times a week with a goal of 10-15lb weight loss in the next 3 months. Patient voiced their understanding and motivation to adhere to these recommendations.       Depression, major, single episode, moderate (HCC)   Chronic, stable.   Denies SI/HI.  Continue Duloxetine  at this time, which also offers benefit to chronic pain and mood, + continue Buspar  which has offered her benefit to anxiety.  Refills as needed.  Will adjust dosing or add medication as needed.          Follow up plan: Return in about 3 months (around 01/19/2024) for T2DM, HTN/HLD, OSA, MOOD.

## 2023-10-19 NOTE — Assessment & Plan Note (Signed)
 BMI 40.39 with T2DM, HTN, CKD.  Recommended eating smaller high protein, low fat meals more frequently and exercising 30 mins a day 5 times a week with a goal of 10-15lb weight loss in the next 3 months. Patient voiced their understanding and motivation to adhere to these recommendations.

## 2023-10-19 NOTE — Assessment & Plan Note (Signed)
 Chronic, ongoing.  Follows with nephrology.  Continue this collaboration, recent notes and labs reviewed. Labs up to date with them.

## 2023-10-19 NOTE — Assessment & Plan Note (Signed)
 Chronic, ongoing, followed by cardiology.  Continue Eliquis and Metoprolol + collaboration with cardiology team.  Rate controlled.  [facility] to [facility] with [facility].  Recent notes reviewed.

## 2023-10-20 ENCOUNTER — Ambulatory Visit: Payer: Self-pay | Admitting: Nurse Practitioner

## 2023-10-20 LAB — LIPID PANEL W/O CHOL/HDL RATIO
Cholesterol, Total: 137 mg/dL (ref 100–199)
HDL: 44 mg/dL (ref >39)
LDL Chol Calc (NIH): 77 mg/dL (ref 0–99)
Triglycerides: 85 mg/dL (ref 0–149)
VLDL Cholesterol Cal: 16 mg/dL (ref 5–40)

## 2023-10-21 ENCOUNTER — Other Ambulatory Visit: Payer: Self-pay | Admitting: Nurse Practitioner

## 2023-10-21 DIAGNOSIS — E1159 Type 2 diabetes mellitus with other circulatory complications: Secondary | ICD-10-CM

## 2023-10-25 NOTE — Telephone Encounter (Signed)
 Requested Prescriptions  Pending Prescriptions Disp Refills   spironolactone  (ALDACTONE ) 25 MG tablet [Pharmacy Med Name: SPIRONOLACTONE  25MG  TABLETS] 180 tablet 1    Sig: TAKE 2 TABLETS(50 MG) BY MOUTH DAILY     Cardiovascular: Diuretics - Aldosterone Antagonist Failed - 10/25/2023 11:48 AM      Failed - Cr in normal range and within 180 days    Creatinine  Date Value Ref Range Status  01/24/2014 0.86 0.60 - 1.30 mg/dL Final   Creatinine, Ser  Date Value Ref Range Status  05/02/2023 1.31 (H) 0.57 - 1.00 mg/dL Final         Failed - Na in normal range and within 180 days    Sodium  Date Value Ref Range Status  05/02/2023 145 (H) 134 - 144 mmol/L Final  01/24/2014 143 136 - 145 mmol/L Final         Passed - K in normal range and within 180 days    Potassium  Date Value Ref Range Status  05/02/2023 3.7 3.5 - 5.2 mmol/L Final  01/24/2014 3.4 (L) 3.5 - 5.1 mmol/L Final         Passed - eGFR is 30 or above and within 180 days    EGFR (African American)  Date Value Ref Range Status  01/24/2014 >60  Final   GFR calc Af Amer  Date Value Ref Range Status  03/19/2017 >60 >60 mL/min Final    Comment:    (NOTE) The eGFR has been calculated using the CKD EPI equation. This calculation has not been validated in all clinical situations. eGFR's persistently <60 mL/min signify possible Chronic Kidney Disease.    EGFR (Non-African Amer.)  Date Value Ref Range Status  01/24/2014 >60  Final    Comment:    eGFR values <59mL/min/1.73 m2 may be an indication of chronic kidney disease (CKD). Calculated eGFR is useful in patients with stable renal function. The eGFR calculation will not be reliable in acutely ill patients when serum creatinine is changing rapidly. It is not useful in  patients on dialysis. The eGFR calculation may not be applicable to patients at the low and high extremes of body sizes, pregnant women, and vegetarians.    GFR, Estimated  Date Value Ref Range  Status  04/05/2022 33 (L) >60 mL/min Final    Comment:    (NOTE) Calculated using the CKD-EPI Creatinine Equation (2021)    eGFR  Date Value Ref Range Status  05/02/2023 43 (L) >59 mL/min/1.73 Final         Passed - Last BP in normal range    BP Readings from Last 1 Encounters:  10/19/23 135/70         Passed - Valid encounter within last 6 months    Recent Outpatient Visits           6 days ago Type 2 diabetes mellitus with morbid obesity (HCC)   [facility] [place], [place] T, NP   3 months ago Type 2 diabetes mellitus with morbid obesity (HCC)   [facility] [place], Lavelle Posey, NP

## 2023-11-04 ENCOUNTER — Telehealth: Payer: Self-pay

## 2023-11-04 NOTE — Progress Notes (Signed)
   11/04/2023  Patient ID: Cindy [name], female   DOB: 1950/11/09, 73 y.o.   MRN: [id number]  Returning missed call/voicemail from patient.  Patient states someone named Tanis Fan has called a couple of times but not left details on her answering machine.  Informed her I do no see where anyone named Tanis Fan, at least through [facility], has tried to contact her.  [facility] called in May about Ozempic  delivery and lab results, but patient received both of those calls.  Linn Rich, PharmD, DPLA

## 2023-12-17 ENCOUNTER — Other Ambulatory Visit: Payer: Self-pay | Admitting: Nurse Practitioner

## 2023-12-19 NOTE — Telephone Encounter (Signed)
 Requested Prescriptions  Pending Prescriptions Disp Refills   DULoxetine  (CYMBALTA ) 60 MG capsule [Pharmacy Med Name: DULOXETINE  DR 60MG  CAPSULES] 90 capsule 0    Sig: TAKE 1 CAPSULE(60 MG) BY MOUTH DAILY     Psychiatry: Antidepressants - SNRI - duloxetine  Failed - 12/19/2023 11:20 AM      Failed - Cr in normal range and within 360 days    Creatinine  Date Value Ref Range Status  01/24/2014 0.86 0.60 - 1.30 mg/dL Final   Creatinine, Ser  Date Value Ref Range Status  05/02/2023 1.31 (H) 0.57 - 1.00 mg/dL Final         Passed - eGFR is 30 or above and within 360 days    EGFR (African American)  Date Value Ref Range Status  01/24/2014 >60  Final   GFR calc Af Amer  Date Value Ref Range Status  03/19/2017 >60 >60 mL/min Final    Comment:    (NOTE) The eGFR has been calculated using the CKD EPI equation. This calculation has not been validated in all clinical situations. eGFR's persistently <60 mL/min signify possible Chronic Kidney Disease.    EGFR (Non-African Amer.)  Date Value Ref Range Status  01/24/2014 >60  Final    Comment:    eGFR values <59mL/min/1.73 m2 may be an indication of chronic kidney disease (CKD). Calculated eGFR is useful in patients with stable renal function. The eGFR calculation will not be reliable in acutely ill patients when serum creatinine is changing rapidly. It is not useful in  patients on dialysis. The eGFR calculation may not be applicable to patients at the low and high extremes of body sizes, pregnant women, and vegetarians.    GFR, Estimated  Date Value Ref Range Status  04/05/2022 33 (L) >60 mL/min Final    Comment:    (NOTE) Calculated using the CKD-EPI Creatinine Equation (2021)    eGFR  Date Value Ref Range Status  05/02/2023 43 (L) >59 mL/min/1.73 Final         Passed - Completed PHQ-2 or PHQ-9 in the last 360 days      Passed - Last BP in normal range    BP Readings from Last 1 Encounters:  10/19/23 135/70          Passed - Valid encounter within last 6 months    Recent Outpatient Visits           2 months ago Type 2 diabetes mellitus with morbid obesity (HCC)   [place] [facility] [place], [place] T, NP   5 months ago Type 2 diabetes mellitus with morbid obesity (HCC)   [place] [facility] [place], Melanie DASEN, NP

## 2023-12-29 ENCOUNTER — Other Ambulatory Visit: Payer: Self-pay | Admitting: Nurse Practitioner

## 2023-12-30 NOTE — Telephone Encounter (Signed)
 Requested medication (s) are due for refill today: expired medication date  Requested medication (s) are on the active medication list: yes   Last refill:  06/25/22 #60 5 refills  Future visit scheduled: yes 01/19/24  Notes to clinic:  expired medication date. Do you want to renew Rx?     Requested Prescriptions  Pending Prescriptions Disp Refills   FEROSUL 325 (65 Fe) MG tablet [Pharmacy Med Name: FERROUS SULFATE  325MG  (5GR) TABS] 60 tablet 5    Sig: TAKE 1 TABLET BY MOUTH TWICE DAILY WITH A MEAL     Endocrinology:  Minerals - Iron Supplementation Failed - 12/30/2023  2:46 PM      Failed - HGB in normal range and within 360 days    Hemoglobin  Date Value Ref Range Status  04/05/2022 10.7 (L) 12.0 - 15.0 g/dL Final  89/75/7977 89.6 (L) 11.1 - 15.9 g/dL Final         Failed - HCT in normal range and within 360 days    HCT  Date Value Ref Range Status  04/05/2022 34.2 (L) 36.0 - 46.0 % Final   Hematocrit  Date Value Ref Range Status  03/23/2021 32.2 (L) 34.0 - 46.6 % Final         Failed - RBC in normal range and within 360 days    RBC  Date Value Ref Range Status  04/05/2022 3.78 (L) 3.87 - 5.11 MIL/uL Final         Failed - Fe (serum) in normal range and within 360 days    Iron  Date Value Ref Range Status  12/03/2020 59 27 - 139 ug/dL Final   Iron Saturation  Date Value Ref Range Status  12/03/2020 16 15 - 55 % Final         Failed - Ferritin in normal range and within 360 days    Ferritin  Date Value Ref Range Status  12/03/2020 63 15 - 150 ng/mL Final         Passed - Valid encounter within last 12 months    Recent Outpatient Visits           2 months ago Type 2 diabetes mellitus with morbid obesity (HCC)   [place] [facility] [place], [place] T, NP   5 months ago Type 2 diabetes mellitus with morbid obesity (HCC)   [place] [facility] [place], Melanie DASEN, NP

## 2024-01-15 NOTE — Patient Instructions (Signed)

## 2024-01-18 DIAGNOSIS — I10 Essential (primary) hypertension: Secondary | ICD-10-CM | POA: Diagnosis not present

## 2024-01-18 DIAGNOSIS — E1122 Type 2 diabetes mellitus with diabetic chronic kidney disease: Secondary | ICD-10-CM | POA: Diagnosis not present

## 2024-01-18 DIAGNOSIS — N2581 Secondary hyperparathyroidism of renal origin: Secondary | ICD-10-CM | POA: Diagnosis not present

## 2024-01-18 DIAGNOSIS — N1832 Chronic kidney disease, stage 3b: Secondary | ICD-10-CM | POA: Diagnosis not present

## 2024-01-18 DIAGNOSIS — D631 Anemia in chronic kidney disease: Secondary | ICD-10-CM | POA: Diagnosis not present

## 2024-01-19 ENCOUNTER — Encounter: Payer: Self-pay | Admitting: Nurse Practitioner

## 2024-01-19 ENCOUNTER — Ambulatory Visit: Admitting: Nurse Practitioner

## 2024-01-19 DIAGNOSIS — E1169 Type 2 diabetes mellitus with other specified complication: Secondary | ICD-10-CM

## 2024-01-19 DIAGNOSIS — E785 Hyperlipidemia, unspecified: Secondary | ICD-10-CM | POA: Diagnosis not present

## 2024-01-19 DIAGNOSIS — K219 Gastro-esophageal reflux disease without esophagitis: Secondary | ICD-10-CM

## 2024-01-19 DIAGNOSIS — I152 Hypertension secondary to endocrine disorders: Secondary | ICD-10-CM | POA: Diagnosis not present

## 2024-01-19 DIAGNOSIS — I480 Paroxysmal atrial fibrillation: Secondary | ICD-10-CM

## 2024-01-19 DIAGNOSIS — E1122 Type 2 diabetes mellitus with diabetic chronic kidney disease: Secondary | ICD-10-CM

## 2024-01-19 DIAGNOSIS — G4733 Obstructive sleep apnea (adult) (pediatric): Secondary | ICD-10-CM

## 2024-01-19 DIAGNOSIS — Z7985 Long-term (current) use of injectable non-insulin antidiabetic drugs: Secondary | ICD-10-CM

## 2024-01-19 DIAGNOSIS — D6869 Other thrombophilia: Secondary | ICD-10-CM

## 2024-01-19 DIAGNOSIS — N184 Chronic kidney disease, stage 4 (severe): Secondary | ICD-10-CM

## 2024-01-19 DIAGNOSIS — E1159 Type 2 diabetes mellitus with other circulatory complications: Secondary | ICD-10-CM | POA: Diagnosis not present

## 2024-01-19 DIAGNOSIS — D631 Anemia in chronic kidney disease: Secondary | ICD-10-CM

## 2024-01-19 DIAGNOSIS — F321 Major depressive disorder, single episode, moderate: Secondary | ICD-10-CM | POA: Diagnosis not present

## 2024-01-19 DIAGNOSIS — E119 Type 2 diabetes mellitus without complications: Secondary | ICD-10-CM | POA: Diagnosis not present

## 2024-01-19 LAB — BAYER DCA HB A1C WAIVED: HB A1C (BAYER DCA - WAIVED): 5.6 % (ref 4.8–5.6)

## 2024-01-19 NOTE — Assessment & Plan Note (Signed)
 Chronic, ongoing CKD 3b.  At this time reduce and renal dose medications as needed + continue collaboration with nephrology.  Labs: A1c.  Avoid NSAIDs.  Refer to diabetes with obesity plan of care for further.  A1c stable at 5.6% today.

## 2024-01-19 NOTE — Assessment & Plan Note (Signed)
 Chronic, ongoing with A1c 5.6% today, slight trend up from 5.5% and urine ALB 150 February 2025.  Praised for ongoing success.  Discussed that she is in normal A1c range which helps to reduce long term poor outcomes. - Continue Jardiance  25 MG and Ozempic  0.5 MG weekly at this time, as seeing significant benefit with this.  Gets via assistance. - Maintain off Glimepiride due to her age and risk for hypoglycemia. Plus maintain off Metformin  due to kidney health. - If poor control once at MAX doses of GLP1 and SGLT2, then will consider referral to endo, but at this time will continue to work on this in office which patient agrees with. - Check BS TID and focus on diabetic diet.    - Eye and foot exams up to date. - Vaccines up to date. - Return in 3 months for A1c -- but alert provider prior to if elevations in BS >300 or <70.

## 2024-01-19 NOTE — Assessment & Plan Note (Signed)
 Chronic, ongoing.  Continue current medication regimen and adjust as needed.  Lipid panel obtained today.

## 2024-01-19 NOTE — Assessment & Plan Note (Signed)
 BMI 37.98 with T2DM, HTN, CKD - continues to have weight loss with Ozempic .  Recommended eating smaller high protein, low fat meals more frequently and exercising 30 mins a day 5 times a week with a goal of 10-15lb weight loss in the next 3 months. Patient voiced their understanding and motivation to adhere to these recommendations.

## 2024-01-19 NOTE — Assessment & Plan Note (Signed)
 Chronic, ongoing.  Follows with nephrology.  Continue this collaboration, recent notes and labs reviewed. Labs up to date with them.

## 2024-01-19 NOTE — Assessment & Plan Note (Signed)
 Chronic, ongoing.  Continue collaboration with nephrology and current medication regimen. Recent labs performed by them and stable.

## 2024-01-19 NOTE — Assessment & Plan Note (Signed)
 Chronic, ongoing with BP at goal for age today.  At this time will continue all current medications and adjust as needed.  Continue collaboration with cardiology and nephrology, recent notes and labs reviewed.  Recommend she monitor BP at least a few mornings a week at home and document.  DASH diet at home.  Labs: CBC, TSH, and A1c.

## 2024-01-19 NOTE — Assessment & Plan Note (Signed)
Chronic, ongoing.  Continue Omeprazole as needed and will check Mag level today. 

## 2024-01-19 NOTE — Assessment & Plan Note (Signed)
 Chronic, stable.   Denies SI/HI.  Continue Duloxetine at this time, which also offers benefit to chronic pain and mood, + continue Buspar which has offered her benefit to anxiety.  Refills as needed.  Will adjust dosing or add medication as needed.

## 2024-01-19 NOTE — Progress Notes (Signed)
 BP 131/76   Pulse 65   Temp 98.6 F (37 C) (Oral)   Ht 5' 1 (1.549 m)   Wt 201 lb (91.2 kg)   SpO2 98%   BMI 37.98 kg/m    Subjective:    Patient ID: Cindy [name], female    DOB: 1950/09/22, 73 y.o.   MRN: [id number]  HPI: Cindy [name] is a 73 y.o. female  Chief Complaint  Patient presents with   Diabetes   Hyperlipidemia   Hypertension   DIABETES May A1c 5.5%.  Continues Jardiance  and Ozempic . [facility] for cost assist with Jardiance  and Ozempic , obtains via their programs.  Has lost 5 lbs since May. Hypoglycemic episodes:no Polydipsia/polyuria: no Visual disturbance: no Chest pain: no Paresthesias: no Glucose Monitoring: yes             Accucheck frequency: Daily             Fasting glucose: 98 to 120 range             Post prandial:             Evening:             Before meals: Taking Insulin ?: no             Long acting insulin :             Short acting insulin : Blood Pressure Monitoring: a few times a week Retinal Examination: Up To Date -- [facility] Foot Exam: Up to Date  Pneumovax: Up To Date  Influenza: Up To Date Aspirin : no    HYPERTENSION / HYPERLIPIDEMIA/A-FIB/HF Taking Olmesartan , Metoprolol , Lasix , Spironolactone , Amlodipine , Atorvastatin .  Continues to have occasional shortness of breath with activity. Saw cardiology last 08/17/23. Pulmonary last seen 10/18/22.  CPAP used 100%.   Continues Eliquis . Echo on 03/15/23 noted LVEF >70% and moderate pericardial effusion. Satisfied with current treatment? yes Duration of hypertension: chronic BP monitoring frequency: three times a week BP range: <130/80 BP medication side effects: no Duration of hyperlipidemia: chronic Cholesterol medication side effects: no Cholesterol supplements: none Medication compliance: good compliance Aspirin : no Recent stressors: no Recurrent headaches: no Visual changes: no Palpitations: occasional flutter Dyspnea: with activity at baseline Chest pain:  no Lower extremity edema: no Dizzy/lightheaded: sometimes with sinuses  CHRONIC KIDNEY DISEASE (CKD 3b) Nephrology seen yesterday, to return in 4 months. CKD status: stable Medications renally dose: yes Previous renal evaluation: no Pneumovax:  Up To Date Influenza Vaccine:  Not up to Date --refuses    Latest Ref Rng & Units 05/02/2023   10:25 AM 04/13/2023   10:23 AM 02/22/2023   11:46 AM  BMP  Glucose 70 - 99 mg/dL 890  899  898   BUN 8 - 27 mg/dL 18  12  7    Creatinine 0.57 - 1.00 mg/dL 8.68  8.78  8.86   BUN/Creat Ratio 12 - 28 14  10  6    Sodium 134 - 144 mmol/L 145  147  144   Potassium 3.5 - 5.2 mmol/L 3.7  3.4  3.5   Chloride 96 - 106 mmol/L 102  102  104   CO2 20 - 29 mmol/L 26  27  27    Calcium  8.7 - 10.3 mg/dL 9.5  9.8  9.3     DEPRESSION Continues on Duloxetine  and Buspar . Mood status: stable Satisfied with current treatment?: yes Symptom severity: moderate  Duration of current treatment : chronic Side effects: no Medication compliance: good compliance Psychotherapy/counseling:  none Depressed mood: occasional Anxious mood: occasional Anhedonia: no Significant weight loss or gain: no Insomnia: occasional Fatigue:occasional Feelings of worthlessness or guilt: no Impaired concentration/indecisiveness: no Suicidal ideations: no Hopelessness: no Crying spells: no    01/19/2024   10:27 AM 10/19/2023    9:55 AM 07/15/2023   10:44 AM 05/18/2023   11:11 AM 04/13/2023   10:21 AM  Depression screen PHQ 2/9  Decreased Interest 0 1 0 0 0  Down, Depressed, Hopeless 1 1 0 0 0  PHQ - 2 Score 1 2 0 0 0  Altered sleeping 1 1 1  0 1  Tired, decreased energy 1 1 1  0 1  Change in appetite 0 0 0 0 0  Feeling bad or failure about yourself  0 0 0 0 0  Trouble concentrating 0 0 0 0 0  Moving slowly or fidgety/restless 0 0 0 0 0  Suicidal thoughts 0 0 0 0 0  PHQ-9 Score 3 4 2  0 2  Difficult doing work/chores Not difficult at all Not difficult at all Not difficult at all  Not difficult at all Not difficult at all       01/19/2024   10:27 AM 10/19/2023    9:56 AM 07/15/2023   10:44 AM 05/18/2023   11:12 AM  GAD 7 : Generalized Anxiety Score  Nervous, Anxious, on Edge 1 1 1  0  Control/stop worrying 0 0 0 0  Worry too much - different things 0 1 1 0  Trouble relaxing 0 0 0 0  Restless 0 0 0 0  Easily annoyed or irritable 0 1 1 0  Afraid - awful might happen 0 0 0 0  Total GAD 7 Score 1 3 3  0  Anxiety Difficulty Not difficult at all Not difficult at all Not difficult at all Not difficult at all   Relevant past medical, surgical, family and social history reviewed and updated as indicated. Interim medical history since our last visit reviewed. Allergies and medications reviewed and updated.  Review of Systems  Constitutional:  Negative for activity change, appetite change, diaphoresis, fatigue and fever.  Respiratory:  Positive for shortness of breath (occasional). Negative for cough, chest tightness and wheezing.   Cardiovascular:  Negative for chest pain, palpitations and leg swelling.  Gastrointestinal: Negative.   Endocrine: Negative for cold intolerance, heat intolerance, polydipsia, polyphagia and polyuria.  Neurological:  Positive for dizziness (occasional). Negative for syncope, weakness, light-headedness and headaches.  Psychiatric/Behavioral: Negative.     Per HPI unless specifically indicated above     Objective:    BP 131/76   Pulse 65   Temp 98.6 F (37 C) (Oral)   Ht 5' 1 (1.549 m)   Wt 201 lb (91.2 kg)   SpO2 98%   BMI 37.98 kg/m   Wt Readings from Last 3 Encounters:  01/19/24 201 lb (91.2 kg)  10/19/23 206 lb 12.8 oz (93.8 kg)  07/15/23 202 lb 3.2 oz (91.7 kg)    Physical Exam Vitals and nursing note reviewed.  Constitutional:      General: She is awake. She is not in acute distress.    Appearance: Normal appearance. She is well-developed and well-groomed. She is obese. She is not ill-appearing or toxic-appearing.  HENT:      Head: Normocephalic.     Right Ear: Hearing and external ear normal.     Left Ear: Hearing and external ear normal.  Eyes:     General: Lids are normal.  Right eye: No discharge.        Left eye: No discharge.     Conjunctiva/sclera: Conjunctivae normal.     Pupils: Pupils are equal, round, and reactive to light.  Neck:     Thyroid : No thyromegaly.     Vascular: No carotid bruit.  Cardiovascular:     Rate and Rhythm: Normal rate and regular rhythm.     Heart sounds: Murmur heard.     Systolic murmur is present with a grade of 2/6.     No gallop.     Comments: Bilateral legs and ankles with no edema and good hair pattern present. Pulmonary:     Effort: Pulmonary effort is normal. No accessory muscle usage or respiratory distress.     Breath sounds: Normal breath sounds.  Abdominal:     General: Bowel sounds are normal. There is no distension.     Palpations: Abdomen is soft.     Tenderness: There is no abdominal tenderness.  Musculoskeletal:     Cervical back: Normal range of motion and neck supple.     Right lower leg: No edema.     Left lower leg: No edema.  Lymphadenopathy:     Cervical: No cervical adenopathy.  Skin:    General: Skin is warm and dry.  Neurological:     Mental Status: She is alert and oriented to person, place, and time.     Deep Tendon Reflexes: Reflexes are normal and symmetric.     Reflex Scores:      Brachioradialis reflexes are 2+ on the right side and 2+ on the left side.      Patellar reflexes are 2+ on the right side and 2+ on the left side. Psychiatric:        Attention and Perception: Attention normal.        Mood and Affect: Mood normal.        Speech: Speech normal.        Behavior: Behavior normal. Behavior is cooperative.        Thought Content: Thought content normal.    Results for orders placed or performed in visit on 10/19/23  Bayer DCA Hb A1c Waived   Collection Time: 10/19/23  9:59 AM  Result Value Ref Range   HB  A1C (BAYER DCA - WAIVED) 5.5 4.8 - 5.6 %  Lipid Panel w/o Chol/HDL Ratio   Collection Time: 10/19/23 10:00 AM  Result Value Ref Range   Cholesterol, Total 137 100 - 199 mg/dL   Triglycerides 85 0 - 149 mg/dL   HDL 44 >60 mg/dL   VLDL Cholesterol Cal 16 5 - 40 mg/dL   LDL Chol Calc (NIH) 77 0 - 99 mg/dL      Assessment & Plan:   Problem List Items Addressed This Visit       Cardiovascular and Mediastinum   Paroxysmal atrial fibrillation (HCC)   Chronic, ongoing, followed by cardiology.  Continue Eliquis  and Metoprolol  + collaboration with cardiology team.  Rate controlled.  [facility] to [facility] with [facility].  Recent notes reviewed.        Hypertension associated with diabetes (HCC)   Chronic, ongoing with BP at goal for age today.  At this time will continue all current medications and adjust as needed.  Continue collaboration with cardiology and nephrology, recent notes and labs reviewed.  Recommend she monitor BP at least a few mornings a week at home and document.  DASH diet at home.  Labs: CBC, TSH,  and A1c.       Relevant Orders   Bayer DCA Hb A1c Waived   TSH   CBC with Differential/Platelet     Respiratory   OSA (obstructive sleep apnea)   Chronic, ongoing.  Continue 100% adherence to CPAP use at night.          Digestive   GERD (gastroesophageal reflux disease)   Chronic, ongoing.  Continue Omeprazole as needed and will check Mag level today.      Relevant Orders   Magnesium     Endocrine   Type 2 diabetes mellitus with morbid obesity (HCC) - Primary   Chronic, ongoing with A1c 5.6% today, slight trend up from 5.5% and urine ALB 150 February 2025.  Praised for ongoing success.  Discussed that she is in normal A1c range which helps to reduce long term poor outcomes. - Continue Jardiance  25 MG and Ozempic  0.5 MG weekly at this time, as seeing significant benefit with this.  Gets via assistance. - Maintain off Glimepiride due to her age and risk for  hypoglycemia. Plus maintain off Metformin  due to kidney health. - If poor control once at MAX doses of GLP1 and SGLT2, then will consider referral to endo, but at this time will continue to work on this in office which patient agrees with. - Check BS TID and focus on diabetic diet.    - Eye and foot exams up to date. - Vaccines up to date. - Return in 3 months for A1c -- but alert provider prior to if elevations in BS >300 or <70.        Relevant Orders   Bayer DCA Hb A1c Waived   Primary hyperparathyroidism (HCC)   Chronic, ongoing.  Continue collaboration with nephrology and current medication regimen. Recent labs performed by them and stable.      Hyperlipidemia associated with type 2 diabetes mellitus (HCC)   Chronic, ongoing.  Continue current medication regimen and adjust as needed.  Lipid panel obtained today.       Relevant Orders   Bayer DCA Hb A1c Waived   Lipid Panel w/o Chol/HDL Ratio   Diabetes mellitus treated with injections of non-insulin  medication (HCC)   Refer to diabetes with obesity plan of care.      Relevant Orders   Bayer DCA Hb A1c Waived   CKD stage 4 due to type 2 diabetes mellitus (HCC)   Chronic, ongoing CKD 3b.  At this time reduce and renal dose medications as needed + continue collaboration with nephrology.  Labs: A1c.  Avoid NSAIDs.  Refer to diabetes with obesity plan of care for further.  A1c stable at 5.6% today.      Relevant Orders   Bayer DCA Hb A1c Waived     Genitourinary   Anemia in chronic kidney disease (CKD)   Chronic, ongoing.  Follows with nephrology.  Continue this collaboration, recent notes and labs reviewed. Labs up to date with them.        Hematopoietic and Hemostatic   Other thrombophilia (HCC)   Related to a-fib and long term Eliquis .  Monitor CBC regularly and monitor skin for bruising or breakdown, notify provider if present.        Other   Morbid obesity (HCC)   BMI 37.98 with T2DM, HTN, CKD - continues to have  weight loss with Ozempic .  Recommended eating smaller high protein, low fat meals more frequently and exercising 30 mins a day 5 times a week with a goal  of 10-15lb weight loss in the next 3 months. Patient voiced their understanding and motivation to adhere to these recommendations.       Depression, major, single episode, moderate (HCC)   Chronic, stable.   Denies SI/HI.  Continue Duloxetine  at this time, which also offers benefit to chronic pain and mood, + continue Buspar  which has offered her benefit to anxiety.  Refills as needed.  Will adjust dosing or add medication as needed.          Follow up plan: Return in about 3 months (around 04/20/2024) for T2DM, HTN/HLD, CKD, MOOD.

## 2024-01-19 NOTE — Assessment & Plan Note (Signed)
Related to a-fib and long term Eliquis.  Monitor CBC regularly and monitor skin for bruising or breakdown, notify provider if present. 

## 2024-01-19 NOTE — Assessment & Plan Note (Signed)
 Chronic, ongoing, followed by cardiology.  Continue Eliquis and Metoprolol + collaboration with cardiology team.  Rate controlled.  [facility] to [facility] with [facility].  Recent notes reviewed.

## 2024-01-19 NOTE — Assessment & Plan Note (Addendum)
Chronic, ongoing.  Continue 100% adherence to CPAP use at night.

## 2024-01-19 NOTE — Assessment & Plan Note (Signed)
 Refer to diabetes with obesity plan of care.

## 2024-01-20 ENCOUNTER — Ambulatory Visit: Payer: Self-pay | Admitting: Nurse Practitioner

## 2024-01-20 LAB — CBC WITH DIFFERENTIAL/PLATELET
Basophils Absolute: 0 x10E3/uL (ref 0.0–0.2)
Basos: 0 %
EOS (ABSOLUTE): 0.1 x10E3/uL (ref 0.0–0.4)
Eos: 1 %
Hematocrit: 37.8 % (ref 34.0–46.6)
Hemoglobin: 11.7 g/dL (ref 11.1–15.9)
Immature Grans (Abs): 0 x10E3/uL (ref 0.0–0.1)
Immature Granulocytes: 0 %
Lymphocytes Absolute: 1.5 x10E3/uL (ref 0.7–3.1)
Lymphs: 26 %
MCH: 28.7 pg (ref 26.6–33.0)
MCHC: 31 g/dL — ABNORMAL LOW (ref 31.5–35.7)
MCV: 93 fL (ref 79–97)
Monocytes Absolute: 0.4 x10E3/uL (ref 0.1–0.9)
Monocytes: 7 %
Neutrophils Absolute: 3.7 x10E3/uL (ref 1.4–7.0)
Neutrophils: 66 %
Platelets: 254 x10E3/uL (ref 150–450)
RBC: 4.07 x10E6/uL (ref 3.77–5.28)
RDW: 13.5 % (ref 11.7–15.4)
WBC: 5.8 x10E3/uL (ref 3.4–10.8)

## 2024-01-20 LAB — LIPID PANEL W/O CHOL/HDL RATIO
Cholesterol, Total: 135 mg/dL (ref 100–199)
HDL: 44 mg/dL (ref >39)
LDL Chol Calc (NIH): 78 mg/dL (ref 0–99)
Triglycerides: 64 mg/dL (ref 0–149)
VLDL Cholesterol Cal: 13 mg/dL (ref 5–40)

## 2024-01-20 LAB — MAGNESIUM: Magnesium: 2.6 mg/dL — ABNORMAL HIGH (ref 1.6–2.3)

## 2024-01-20 LAB — TSH: TSH: 1.45 u[IU]/mL (ref 0.450–4.500)

## 2024-01-20 NOTE — Progress Notes (Signed)
 Good morning, please let Conley know all labs have returned and continue to look great with exception of magnesium level remaining mildly elevated.  If taking a magnesium supplement try to cut back on this to 3 days a week.  Any questions? Keep being stellar!!  Thank you for allowing me to participate in your care.  I appreciate you. Kindest regards, Dauntae Derusha

## 2024-01-23 ENCOUNTER — Telehealth: Payer: Self-pay

## 2024-01-23 NOTE — Telephone Encounter (Signed)
4 boxes of Ozempic received for the patient. Called and notified patient that medication was ready to be picked up.  

## 2024-02-01 ENCOUNTER — Other Ambulatory Visit: Payer: Self-pay | Admitting: Nurse Practitioner

## 2024-02-02 NOTE — Telephone Encounter (Signed)
 Requested medication (s) are due for refill today: yes  Requested medication (s) are on the active medication list: yes  Last refill:  03/23/22 30 ml 12 RF  Future visit scheduled: yes  Notes to clinic:  order written 2023   Requested Prescriptions  Pending Prescriptions Disp Refills   azelastine  (ASTELIN ) 0.1 % nasal spray [Pharmacy Med Name: AZELASTINE  0.1%(137MCG) NASAL-200SP] 30 mL 12    Sig: SPRAY ONCE IN EACH NOSTRIL TWICE DAILY AS DIRECTED     Ear, Nose, and Throat: Nasal Preparations - Antiallergy Passed - 02/02/2024  1:21 PM      Passed - Valid encounter within last 12 months    Recent Outpatient Visits           2 weeks ago Type 2 diabetes mellitus with morbid obesity (HCC)   [place] [facility] [place], [place] T, NP   3 months ago Type 2 diabetes mellitus with morbid obesity (HCC)   [place] [place], [place] T, NP   6 months ago Type 2 diabetes mellitus with morbid obesity (HCC)   [place] [facility] [place], Melanie DASEN, NP

## 2024-02-02 NOTE — Telephone Encounter (Signed)
 Requested medication (s) are due for refill today -expired rx  Requested medication (s) are on the active medication list -yes  Future visit scheduled -yes  Last refill: 03/23/22 30ml 12RF  Notes to clinic: expired Rx- sent for review of request  Requested Prescriptions  Pending Prescriptions Disp Refills   azelastine  (ASTELIN ) 0.1 % nasal spray [Pharmacy Med Name: AZELASTINE  0.1%(137MCG) NASAL-200SP] 30 mL 12    Sig: SPRAY ONCE IN EACH NOSTRIL TWICE DAILY AS DIRECTED     Ear, Nose, and Throat: Nasal Preparations - Antiallergy Passed - 02/02/2024 12:29 PM      Passed - Valid encounter within last 12 months    Recent Outpatient Visits           2 weeks ago Type 2 diabetes mellitus with morbid obesity (HCC)   [place] [facility] [place], [place] T, NP   3 months ago Type 2 diabetes mellitus with morbid obesity (HCC)   [place] [facility] [place], [place] T, NP   6 months ago Type 2 diabetes mellitus with morbid obesity (HCC)   [place] [facility] [place], Melanie T, NP                 Requested Prescriptions  Pending Prescriptions Disp Refills   azelastine  (ASTELIN ) 0.1 % nasal spray [Pharmacy Med Name: AZELASTINE  0.1%(137MCG) NASAL-200SP] 30 mL 12    Sig: SPRAY ONCE IN EACH NOSTRIL TWICE DAILY AS DIRECTED     Ear, Nose, and Throat: Nasal Preparations - Antiallergy Passed - 02/02/2024 12:29 PM      Passed - Valid encounter within last 12 months    Recent Outpatient Visits           2 weeks ago Type 2 diabetes mellitus with morbid obesity (HCC)   [place] [facility] [place], [place] T, NP   3 months ago Type 2 diabetes mellitus with morbid obesity (HCC)   [place] [facility] [place], [place] T, NP   6 months ago Type 2 diabetes mellitus with morbid obesity (HCC)   [place] [facility] [place], Melanie DASEN, NP

## 2024-02-06 ENCOUNTER — Other Ambulatory Visit: Payer: Self-pay | Admitting: Nurse Practitioner

## 2024-02-07 ENCOUNTER — Ambulatory Visit: Payer: Self-pay

## 2024-02-07 VITALS — Ht 61.0 in | Wt 202.0 lb

## 2024-02-07 DIAGNOSIS — Z0000 Encounter for general adult medical examination without abnormal findings: Secondary | ICD-10-CM | POA: Diagnosis not present

## 2024-02-07 DIAGNOSIS — Z1231 Encounter for screening mammogram for malignant neoplasm of breast: Secondary | ICD-10-CM

## 2024-02-07 NOTE — Patient Instructions (Signed)
 Cindy [name],  Thank you for taking the time for your Medicare Wellness Visit. I appreciate your continued commitment to your health goals. Please review the care plan we discussed, and feel free to reach out if I can assist you further.  Medicare recommends these wellness visits once per year to help you and your care team stay ahead of potential health issues. These visits are designed to focus on prevention, allowing your provider to concentrate on managing your acute and chronic conditions during your regular appointments.  Please note that Annual Wellness Visits do not include a physical exam. Some assessments may be limited, especially if the visit was conducted virtually. If needed, we may recommend a separate in-person follow-up with your provider.  Ongoing Care Seeing your primary care provider every 3 to 6 months helps us  monitor your health and provide consistent, personalized care.   Referrals If a referral was made during today's visit and you haven't received any updates within two weeks, please contact the referred provider directly to check on the status.  Recommended Screenings: Get the flu vaccine at your next office visit.  Please call to schedule your mammogram (due 03/04/24):  [facility] Address: [address], [place], [place] Phone: [phone]  [facility] [address] [place], [place] [address] Phone: [phone]   Health Maintenance  Topic Date Due   Eye exam for diabetics  Never done   Flu Shot  12/30/2023   COVID-19 Vaccine (3 - 2025-26 season) 01/30/2024   Zoster (Shingles) Vaccine (1 of 2) 04/20/2024*   Mammogram  03/03/2024   Yearly kidney function blood test for diabetes  05/01/2024   Yearly kidney health urinalysis for diabetes  07/14/2024   Complete foot exam   07/14/2024   Hemoglobin A1C  07/21/2024   Medicare Annual Wellness Visit  02/06/2025   Colon Cancer  Screening  04/27/2027   DEXA scan (bone density measurement)  04/28/2031   DTaP/Tdap/Td vaccine (2 - Tdap) 12/22/2031   Pneumococcal Vaccine for age over 27  Completed   Hepatitis C Screening  Completed   HPV Vaccine  Aged Out   Meningitis B Vaccine  Aged Out  *Topic was postponed. The date shown is not the original due date.       02/07/2024    2:02 PM  Advanced Directives  Does Patient Have a Medical Advance Directive? No  Would patient like information on creating a medical advance directive? No - Patient declined   Advance Care Planning is important because it: Ensures you receive medical care that aligns with your values, goals, and preferences. Provides guidance to your family and loved ones, reducing the emotional burden of decision-making during critical moments.  Vision: Annual vision screenings are recommended for early detection of glaucoma, cataracts, and diabetic retinopathy. These exams can also reveal signs of chronic conditions such as diabetes and high blood pressure.  Dental: Annual dental screenings help detect early signs of oral cancer, gum disease, and other conditions linked to overall health, including heart disease and diabetes.  Please see the attached documents for additional preventive care recommendations.   Fall Prevention in the Home, Adult Falls can cause injuries and affect people of all ages. There are many simple things that you can do to make your home safe and to help prevent falls. If you need it, ask for help making these changes. What actions can I take to prevent falls? General information Use good  lighting in all rooms. Make sure to: Replace any light bulbs that burn out. Turn on lights if it is dark and use night-lights. Keep items that you use often in easy-to-reach places. Lower the shelves around your home if needed. Move furniture so that there are clear paths around it. Do not keep throw rugs or other things on the floor that can make  you trip. If any of your floors are uneven, fix them. Add color or contrast paint or tape to clearly mark and help you see: Grab bars or handrails. First and last steps of staircases. Where the edge of each step is. If you use a ladder or stepladder: Make sure that it is fully opened. Do not climb a closed ladder. Make sure the sides of the ladder are locked in place. Have someone hold the ladder while you use it. Know where your pets are as you move through your home. What can I do in the bathroom?     Keep the floor dry. Clean up any water that is on the floor right away. Remove soap buildup in the bathtub or shower. Buildup makes bathtubs and showers slippery. Use non-skid mats or decals on the floor of the bathtub or shower. Attach bath mats securely with double-sided, non-slip rug tape. If you need to sit down while you are in the shower, use a non-slip stool. Install grab bars by the toilet and in the bathtub and shower. Do not use towel bars as grab bars. What can I do in the bedroom? Make sure that you have a light by your bed that is easy to reach. Do not use any sheets or blankets on your bed that hang to the floor. Have a firm bench or chair with side arms that you can use for support when you get dressed. What can I do in the kitchen? Clean up any spills right away. If you need to reach something above you, use a sturdy step stool that has a grab bar. Keep electrical cables out of the way. Do not use floor polish or wax that makes floors slippery. What can I do with my stairs? Do not leave anything on the stairs. Make sure that you have a light switch at the top and the bottom of the stairs. Have them installed if you do not have them. Make sure that there are handrails on both sides of the stairs. Fix handrails that are broken or loose. Make sure that handrails are as long as the staircases. Install non-slip stair treads on all stairs in your home if they do not have  carpet. Avoid having throw rugs at the top or bottom of stairs, or secure the rugs with carpet tape to prevent them from moving. Choose a carpet design that does not hide the edge of steps on the stairs. Make sure that carpet is firmly attached to the stairs. Fix any carpet that is loose or worn. What can I do on the outside of my home? Use bright outdoor lighting. Repair the edges of walkways and driveways and fix any cracks. Clear paths of anything that can make you trip, such as tools or rocks. Add color or contrast paint or tape to clearly mark and help you see high doorway thresholds. Trim any bushes or trees on the main path into your home. Check that handrails are securely fastened and in good repair. Both sides of all steps should have handrails. Install guardrails along the edges of any raised decks or  porches. Have leaves, snow, and ice cleared regularly. Use sand, salt, or ice melt on walkways during winter months if you live where there is ice and snow. In the garage, clean up any spills right away, including grease or oil spills. What other actions can I take? Review your medicines with your health care provider. Some medicines can make you confused or feel dizzy. This can increase your chance of falling. Wear closed-toe shoes that fit well and support your feet. Wear shoes that have rubber soles and low heels. Use a cane, walker, scooter, or crutches that help you move around if needed. Talk with your provider about other ways that you can decrease your risk of falls. This may include seeing a physical therapist to learn to do exercises to improve movement and strength. Where to find more information Centers for Disease Control and Prevention, STEADI: TonerPromos.no [employer] on Aging: BaseRingTones.pl National Institute on Aging: BaseRingTones.pl Contact a health care provider if: You are afraid of falling at home. You feel weak, drowsy, or dizzy at home. You fall at home. Get help  right away if you: Lose consciousness or have trouble moving after a fall. Have a fall that causes a head injury. These symptoms may be an emergency. Get help right away. Call 911. Do not wait to see if the symptoms will go away. Do not drive yourself to the hospital. This information is not intended to replace advice given to you by your health care provider. Make sure you discuss any questions you have with your health care provider. Document Revised: 01/18/2022 Document Reviewed: 01/18/2022 Elsevier Patient Education  2024 [employer].

## 2024-02-07 NOTE — Progress Notes (Signed)
 Subjective:   Cindy [name] is a 73 y.o. who presents for a Medicare Wellness preventive visit.  As a reminder, Annual Wellness Visits don't include a physical exam, and some assessments may be limited, especially if this visit is performed virtually. We may recommend an in-person follow-up visit with your provider if needed.  Visit Complete: Virtual I connected with  Cindy [name] on 02/07/24 by a audio enabled telemedicine application and verified that I am speaking with the correct person using two identifiers.  Patient Location: Home  Provider Location: Office/Clinic  I discussed the limitations of evaluation and management by telemedicine. The patient expressed understanding and agreed to proceed.  Vital Signs: Because this visit was a virtual/telehealth visit, some criteria may be missing or patient reported. Any vitals not documented were not able to be obtained and vitals that have been documented are patient reported.  VideoDeclined- This patient declined Librarian, academic. Therefore the visit was completed with audio only.  Persons Participating in Visit: Patient.  AWV Questionnaire: No: Patient Medicare AWV questionnaire was not completed prior to this visit.  Cardiac Risk Factors include: advanced age (>88men, >74 women);hypertension;dyslipidemia;diabetes mellitus;obesity (BMI >30kg/m2);smoking/ tobacco exposure     Objective:    Today's Vitals   02/07/24 1345  Weight: 202 lb (91.6 kg)  Height: 5' 1 (1.549 m)   Body mass index is 38.17 kg/m.     02/07/2024    2:02 PM 02/01/2023    3:21 PM 04/05/2022    9:02 AM 01/20/2022   11:35 AM 01/19/2021    2:05 PM 11/19/2020    8:32 PM 11/19/2020    5:46 PM  Advanced Directives  Does Patient Have a Medical Advance Directive? No No No No No No No  Would patient like information on creating a medical advance directive? No - Patient declined No - Patient declined No - Patient declined   No -  Patient declined     Current Medications (verified) Outpatient Encounter Medications as of 02/07/2024  Medication Sig   acetaminophen  (TYLENOL ) 650 MG CR tablet Take 650 mg by mouth every 8 (eight) hours as needed for pain.   amLODipine  (NORVASC ) 2.5 MG tablet Take 1 tablet (2.5 mg total) by mouth daily.   apixaban  (ELIQUIS ) 5 MG TABS tablet Take 1 tablet (5 mg total) by mouth 2 (two) times daily. Please place on hold until patient requests refill.   atorvastatin  (LIPITOR) 80 MG tablet Take 1 tablet (80 mg total) by mouth daily.   azelastine  (ASTELIN ) 0.1 % nasal spray SPRAY ONCE IN EACH NOSTRIL TWICE DAILY AS DIRECTED   busPIRone  (BUSPAR ) 5 MG tablet Take 1 tablet (5 mg total) by mouth 2 (two) times daily. (Patient taking differently: Take 5 mg by mouth 2 (two) times daily. Taking bid prn)   calcitRIOL (ROCALTROL) 0.25 MCG capsule Take 0.25 mcg by mouth daily.   cetirizine (ZYRTEC) 10 MG tablet Take 10 mg by mouth daily.   diclofenac Sodium (VOLTAREN ARTHRITIS PAIN) 1 % GEL Apply 2 g topically 4 (four) times daily as needed.   DULoxetine  (CYMBALTA ) 60 MG capsule TAKE 1 CAPSULE(60 MG) BY MOUTH DAILY   empagliflozin  (JARDIANCE ) 25 MG TABS tablet Take 1 tablet (25 mg total) by mouth daily before breakfast.   ferrous sulfate  (FEROSUL) 325 (65 FE) MG tablet Take 1 tablet (325 mg total) by mouth daily.   fluticasone  (FLONASE ) 50 MCG/ACT nasal spray SHAKE LIQUID AND USE 2 SPRAYS IN EACH NOSTRIL DAILY   furosemide  (  LASIX ) 80 MG tablet TAKE 1 TABLET BY MOUTH TWICE DAILY FOR FLUID RETENTION   glucose blood test strip To check blood sugar twice a day with goal fasting in morning <130 and goal 2 hours after meal <180.  Write all checks down for provider visits.   Metamucil Fiber CHEW Chew 1 tablet by mouth daily.   metoprolol  succinate (TOPROL -XL) 50 MG 24 hr tablet Take 1 tablet by mouth daily.   minoxidil  (LONITEN ) 10 MG tablet TAKE ONE TABLET BY MOUTH ONCE DAILY FOR BLOOD PRESSURE   montelukast   (SINGULAIR ) 10 MG tablet Take 1 tablet (10 mg total) by mouth at bedtime.   mupirocin  ointment (BACTROBAN ) 2 % Apply 1 Application topically 2 (two) times daily.   olmesartan  (BENICAR ) 40 MG tablet Take 1 tablet (40 mg total) by mouth daily.   omeprazole (PRILOSEC) 20 MG capsule Take 20 mg by mouth daily as needed (heartburn).    Semaglutide ,0.25 or 0.5MG /DOS, (OZEMPIC , 0.25 OR 0.5 MG/DOSE,) 2 MG/1.5ML SOPN Inject 0.5 mg into the skin once a week.   sennosides-docusate sodium  (SENOKOT-S) 8.6-50 MG tablet Take 2 tablets by mouth daily.   spironolactone  (ALDACTONE ) 25 MG tablet TAKE 2 TABLETS(50 MG) BY MOUTH DAILY   tiZANidine  (ZANAFLEX ) 2 MG tablet Take 1 tablet (2 mg total) by mouth every 6 (six) hours as needed for muscle spasms.   No facility-administered encounter medications on file as of 02/07/2024.    Allergies (verified) Bupropion, Gabapentin, Pioglitazone, Vicodin [hydrocodone-acetaminophen ], and Vicoprofen [hydrocodone-ibuprofen]   History: Past Medical History:  Diagnosis Date   Chronic pain syndrome    Depression    Diabetes mellitus without complication (HCC)    GERD (gastroesophageal reflux disease)    History of hiatal hernia    Hypertension    Iron deficiency anemia    Microalbuminuric diabetic nephropathy (HCC)    Mitral regurgitation    Obesity    Shortness of breath dyspnea    Sleep apnea    Past Surgical History:  Procedure Laterality Date   ABDOMINAL HYSTERECTOMY     BACK SURGERY     BREAST BIOPSY Right 03/2003   CARDIAC CATHETERIZATION N/A 10/10/2014   Procedure: Left Heart Cath;  Surgeon: Marsa Dooms, MD;  Location: [facility] INVASIVE CV LAB;  Service: Cardiovascular;  Laterality: N/A;   CARDIAC CATHETERIZATION     carpal tunnell release  10/23/11   COLONOSCOPY WITH PROPOFOL  N/A 02/20/2015   Procedure: COLONOSCOPY WITH PROPOFOL ;  Surgeon: Deward CINDERELLA Piedmont, MD;  Location: [facility] ENDOSCOPY;  Service: Gastroenterology;  Laterality: N/A;   COLONOSCOPY WITH PROPOFOL   N/A 04/26/2017   Procedure: COLONOSCOPY WITH PROPOFOL ;  Surgeon: Toledo, Ladell POUR, MD;  Location: [facility] ENDOSCOPY;  Service: Gastroenterology;  Laterality: N/A;   ESOPHAGOGASTRODUODENOSCOPY (EGD) WITH PROPOFOL  N/A 02/20/2015   Procedure: ESOPHAGOGASTRODUODENOSCOPY (EGD) WITH PROPOFOL ;  Surgeon: Deward CINDERELLA Piedmont, MD;  Location: [facility] ENDOSCOPY;  Service: Gastroenterology;  Laterality: N/A;   ESOPHAGOGASTRODUODENOSCOPY (EGD) WITH PROPOFOL  N/A 04/26/2017   Procedure: ESOPHAGOGASTRODUODENOSCOPY (EGD) WITH PROPOFOL ;  Surgeon: Toledo, Ladell POUR, MD;  Location: [facility] ENDOSCOPY;  Service: Gastroenterology;  Laterality: N/A;   HERNIA REPAIR     LAPAROSCOPIC OVARIAN CYSTECTOMY  08/2001   nissen fundoplycation  11/99   NOSE SURGERY     Family History  Problem Relation Age of Onset   Heart attack Mother    Hypertension Father    Stroke Father    Heart attack Brother    Social History   Socioeconomic History   Marital status: Married    Spouse name:  Russell   Number of children: 5   Years of education: Not on file   Highest education level: Not on file  Occupational History   Occupation: disabled  Tobacco Use   Smoking status: Never   Smokeless tobacco: Current    Types: Snuff, Chew  Vaping Use   Vaping status: Never Used  Substance and Sexual Activity   Alcohol use: No   Drug use: No   Sexual activity: Not Currently  Other Topics Concern   Not on file  Social History Narrative   Married, 5 children 4 biological daughters and 1 adopted son (nephew)   Social Drivers of Corporate investment banker Strain: Low Risk  (02/07/2024)   Overall Financial Resource Strain (CARDIA)    Difficulty of Paying Living Expenses: Not hard at all  Food Insecurity: No Food Insecurity (02/07/2024)   Hunger Vital Sign    Worried About Running Out of Food in the Last Year: Never true    Ran Out of Food in the Last Year: Never true  Transportation Needs: No Transportation Needs (02/07/2024)   PRAPARE - Therapist, art (Medical): No    Lack of Transportation (Non-Medical): No  Physical Activity: Insufficiently Active (02/07/2024)   Exercise Vital Sign    Days of Exercise per Week: 3 days    Minutes of Exercise per Session: 20 min  Stress: No Stress Concern Present (02/07/2024)   [employer] of Occupational Health - Occupational Stress Questionnaire    Feeling of Stress: Only a little  Social Connections: Moderately Integrated (02/07/2024)   Social Connection and Isolation Panel    Frequency of Communication with Friends and Family: More than three times a week    Frequency of Social Gatherings with Friends and Family: More than three times a week    Attends Religious Services: More than 4 times per year    Active Member of [employer] or Organizations: No    Attends Engineer, structural: Never    Marital Status: Married    Tobacco Counseling Ready to quit: Not Answered Counseling given: Not Answered    Clinical Intake:  Pre-visit preparation completed: Yes  Pain : No/denies pain     BMI - recorded: 38.17 Nutritional Status: BMI > 30  Obese Nutritional Risks: None Diabetes: Yes CBG done?: No (FBS 116 per patient) Did pt. bring in CBG monitor from home?: No  Lab Results  Component Value Date   HGBA1C 5.6 01/19/2024   HGBA1C 5.5 10/19/2023   HGBA1C 5.9 (H) 07/15/2023     How often do you need to have someone help you when you read instructions, pamphlets, or other written materials from your doctor or pharmacy?: 1 - Never  Interpreter Needed?: No  Information entered by :: Vina Ned, CMA   Activities of Daily Living     02/07/2024    1:48 PM  In your present state of health, do you have any difficulty performing the following activities:  Hearing? 0  Vision? 0  Difficulty concentrating or making decisions? 1  Comment some trouble concentrating  Walking or climbing stairs? 1  Comment uses cane and rolling walker  Dressing or bathing? 0   Doing errands, shopping? 0  Preparing Food and eating ? N  Using the Toilet? N  In the past six months, have you accidently leaked urine? N  Do you have problems with loss of bowel control? N  Managing your Medications? N  Managing your Finances? N  Housekeeping or managing your Housekeeping? N    Patient Care Team: Cannady, Jolene T, NP as PCP - General (Nurse Practitioner) [facility] (Ophthalmology) Lateef, Munsoor, MD as Referring Physician (Nephrology) Fernand Elfreda LABOR, MD as Consulting Physician (Internal Medicine) [name], [facility] (Pharmacist) Jama Margery ORN, MD as Referring Physician (Cardiology)  I have updated your Care Teams any recent Medical Services you may have received from other providers in the past year.     Assessment:   This is a routine wellness examination for Cindy [name].  Hearing/Vision screen Hearing Screening - Comments:: Denies hearing loss  Vision Screening - Comments:: Gets DM eye exam, [facility], [place]   Goals Addressed               This Visit's Progress     Weight (lb) < 150 lb (68 kg) (pt-stated)   202 lb (91.6 kg)      Depression Screen     02/07/2024    1:59 PM 01/19/2024   10:27 AM 10/19/2023    9:55 AM 07/15/2023   10:44 AM 05/18/2023   11:11 AM 04/13/2023   10:21 AM 02/24/2023   10:40 AM  PHQ 2/9 Scores  PHQ - 2 Score 1 1 2  0 0 0 0  PHQ- 9 Score 4 3 4 2  0 2 2    Fall Risk     02/07/2024    2:04 PM 01/19/2024   10:26 AM 10/19/2023    9:55 AM 07/15/2023   10:44 AM 05/18/2023   11:11 AM  Fall Risk   Falls in the past year? 0 0 0 0 0  Number falls in past yr: 0 0 0 0 0  Injury with Fall? 0 0 0 0 0  Risk for fall due to : History of fall(s);Impaired balance/gait;Orthopedic patient;Impaired mobility No Fall Risks No Fall Risks No Fall Risks No Fall Risks  Follow up Falls evaluation completed;Education provided Falls evaluation completed Falls evaluation completed Falls evaluation completed  Falls evaluation completed    MEDICARE RISK AT HOME:  Medicare Risk at Home Any stairs in or around the home?: Yes (3 steps) If so, are there any without handrails?: Yes Home free of loose throw rugs in walkways, pet beds, electrical cords, etc?: Yes Adequate lighting in your home to reduce risk of falls?: Yes Life alert?: Yes Use of a cane, walker or w/c?: Yes (cane and rolling walker) Grab bars in the bathroom?: Yes Shower chair or bench in shower?: Yes Elevated toilet seat or a handicapped toilet?: No  TIMED UP AND GO:  Was the test performed?  No  Cognitive Function: 6CIT completed        02/07/2024    2:05 PM 02/01/2023    3:25 PM 01/20/2022   11:40 AM 01/19/2021    2:11 PM  6CIT Screen  What Year? 0 points 0 points 0 points 0 points  What month? 0 points 0 points 0 points 0 points  What time? 0 points 0 points 0 points 0 points  Count back from 20 0 points 0 points 0 points 0 points  Months in reverse 2 points 0 points 0 points 2 points  Repeat phrase 0 points 0 points 0 points 0 points  Total Score 2 points 0 points 0 points 2 points    Immunizations Immunization History  Administered Date(s) Administered   Fluad Trivalent(High Dose 65+) 07/15/2023   Moderna Sars-Covid-2 Vaccination 11/18/2020, 12/16/2020   PNEUMOCOCCAL CONJUGATE-20 09/29/2022  Pneumococcal Conjugate-13 03/23/2021   Td 12/21/2021    Screening Tests Health Maintenance  Topic Date Due   OPHTHALMOLOGY EXAM  Never done   Influenza Vaccine  12/30/2023   COVID-19 Vaccine (3 - 2025-26 season) 01/30/2024   Zoster Vaccines- Shingrix (1 of 2) 04/20/2024 (Originally 01/27/2000)   MAMMOGRAM  03/03/2024   Diabetic kidney evaluation - eGFR measurement  05/01/2024   Diabetic kidney evaluation - Urine ACR  07/14/2024   FOOT EXAM  07/14/2024   HEMOGLOBIN A1C  07/21/2024   Medicare Annual Wellness (AWV)  02/06/2025   Colonoscopy  04/27/2027   DEXA SCAN  04/28/2031   DTaP/Tdap/Td (2 - Tdap) 12/22/2031    Pneumococcal Vaccine: 50+ Years  Completed   Hepatitis C Screening  Completed   HPV VACCINES  Aged Out   Meningococcal B Vaccine  Aged Out    Health Maintenance Items Addressed: Mammogram ordered, See Nurse Notes at the end of this note  Additional Screening:  Vision Screening: Recommended annual ophthalmology exams for early detection of glaucoma and other disorders of the eye. Is the patient up to date with their annual eye exam?  Yes  Who is the provider or what is the name of the office in which the patient attends annual eye exams? [facility], [place]  Dental Screening: Recommended annual dental exams for proper oral hygiene  Community Resource Referral / Chronic Care Management: CRR required this visit?  No   CCM required this visit?  No   Plan:    I have personally reviewed and noted the following in the patient's chart:   Medical and social history Use of alcohol, tobacco or illicit drugs  Current medications and supplements including opioid prescriptions. Patient is not currently taking opioid prescriptions. Functional ability and status Nutritional status Physical activity Advanced directives List of other physicians Hospitalizations, surgeries, and ER visits in previous 12 months Vitals Screenings to include cognitive, depression, and falls Referrals and appointments  In addition, I have reviewed and discussed with patient certain preventive protocols, quality metrics, and best practice recommendations. A written personalized care plan for preventive services as well as general preventive health recommendations were provided to patient.   Vina Ned, CMA   02/07/2024   After Visit Summary: (Mail) Due to this being a telephonic visit, the after visit summary with patients personalized plan was offered to patient via mail   Notes:  6 CIT Score - 2 FBS this morning per patient 116 Requested most recent eye exam (2025 per patient) from [facility],  [place], [place] Placed order for a MMG (due ~ 03/04/24) Plans to get flu vaccine at next OV Declined DM & Nutrition education referral Declined Shingles and covid vaccines

## 2024-02-07 NOTE — Telephone Encounter (Signed)
 Requested Prescriptions  Refused Prescriptions Disp Refills   amLODipine  (NORVASC ) 2.5 MG tablet [Pharmacy Med Name: AMLODIPINE  BESYLATE 2.5MG TABLETS] 60 tablet     Sig: TAKE 1 TABLET(2.5 MG) BY MOUTH DAILY     Cardiovascular: Calcium  Channel Blockers 2 Passed - 02/07/2024  5:16 PM      Passed - Last BP in normal range    BP Readings from Last 1 Encounters:  01/19/24 131/76         Passed - Last Heart Rate in normal range    Pulse Readings from Last 1 Encounters:  01/19/24 65         Passed - Valid encounter within last 6 months    Recent Outpatient Visits           2 weeks ago Type 2 diabetes mellitus with morbid obesity (HCC)   [place] [facility] [place], Wilder T, NP   3 months ago Type 2 diabetes mellitus with morbid obesity (HCC)   [place] [facility] [place], [place] T, NP   6 months ago Type 2 diabetes mellitus with morbid obesity (HCC)    [facility] [place], Melanie DASEN, NP

## 2024-02-08 ENCOUNTER — Other Ambulatory Visit: Payer: Self-pay | Admitting: Nurse Practitioner

## 2024-02-08 NOTE — Telephone Encounter (Unsigned)
 Copied from CRM [id number]. Topic: Clinical - Medication Refill >> Feb 08, 2024  9:31 AM Everette C wrote: Medication: olmesartan  (BENICAR ) 40 MG tablet - 90 day supply   amLODipine  (NORVASC ) 2.5 MG tablet  Has the patient contacted their pharmacy? Yes (Agent: If no, request that the patient contact the pharmacy for the refill. If patient does not wish to contact the pharmacy document the reason why and proceed with request.) (Agent: If yes, when and what did the pharmacy advise?)  This is the patient's preferred pharmacy:  [id number], [place] - [address] AT [facility] [address] [place] [address] Phone: [phone] Fax: [phone]  Is this the correct pharmacy for this prescription? Yes If no, delete pharmacy and type the correct one.   Has the prescription been filled recently? Yes  Is the patient out of the medication? Yes  Has the patient been seen for an appointment in the last year OR does the patient have an upcoming appointment? Yes  Can we respond through MyChart? No  Agent: Please be advised that Rx refills may take up to 3 business days. We ask that you follow-up with your pharmacy.

## 2024-02-09 MED ORDER — OLMESARTAN MEDOXOMIL 40 MG PO TABS: 40.0000 mg | ORAL_TABLET | Freq: Every day | ORAL | 4 refills | Status: AC

## 2024-02-09 NOTE — Telephone Encounter (Signed)
 Requested Prescriptions  Pending Prescriptions Disp Refills   olmesartan  (BENICAR ) 40 MG tablet 90 tablet 4    Sig: Take 1 tablet (40 mg total) by mouth daily.     Cardiovascular:  Angiotensin Receptor Blockers Failed - 02/09/2024 12:57 PM      Failed - Cr in normal range and within 180 days    Creatinine  Date Value Ref Range Status  01/24/2014 0.86 0.60 - 1.30 mg/dL Final   Creatinine, Ser  Date Value Ref Range Status  05/02/2023 1.31 (H) 0.57 - 1.00 mg/dL Final         Failed - K in normal range and within 180 days    Potassium  Date Value Ref Range Status  05/02/2023 3.7 3.5 - 5.2 mmol/L Final  01/24/2014 3.4 (L) 3.5 - 5.1 mmol/L Final         Passed - Patient is not pregnant      Passed - Last BP in normal range    BP Readings from Last 1 Encounters:  01/19/24 131/76         Passed - Valid encounter within last 6 months    Recent Outpatient Visits           3 weeks ago Type 2 diabetes mellitus with morbid obesity (HCC)   [place] [facility] [place], [place] T, NP   3 months ago Type 2 diabetes mellitus with morbid obesity (HCC)   [place] [facility] [place], Morrison T, NP   6 months ago Type 2 diabetes mellitus with morbid obesity (HCC)   [place] [facility] [place], Jolene T, NP               amLODipine  (NORVASC ) 2.5 MG tablet 90 tablet 4    Sig: Take 1 tablet (2.5 mg total) by mouth daily.     Cardiovascular: Calcium  Channel Blockers 2 Passed - 02/09/2024 12:57 PM      Passed - Last BP in normal range    BP Readings from Last 1 Encounters:  01/19/24 131/76         Passed - Last Heart Rate in normal range    Pulse Readings from Last 1 Encounters:  01/19/24 65         Passed - Valid encounter within last 6 months    Recent Outpatient Visits           3 weeks ago Type 2 diabetes mellitus with morbid obesity (HCC)   [place] [facility] [place], Cataract T, NP   3 months ago Type 2  diabetes mellitus with morbid obesity (HCC)   [place] [facility] [place], [place] T, NP   6 months ago Type 2 diabetes mellitus with morbid obesity (HCC)    [facility] [place], Melanie DASEN, NP

## 2024-02-09 NOTE — Telephone Encounter (Signed)
 Too early. Refilled 05/18/23/# 90 with 4 refills. Requested Prescriptions  Signed Prescriptions Disp Refills   olmesartan  (BENICAR ) 40 MG tablet 90 tablet 4    Sig: Take 1 tablet (40 mg total) by mouth daily.     Cardiovascular:  Angiotensin Receptor Blockers Failed - 02/09/2024 12:59 PM      Failed - Cr in normal range and within 180 days    Creatinine  Date Value Ref Range Status  01/24/2014 0.86 0.60 - 1.30 mg/dL Final   Creatinine, Ser  Date Value Ref Range Status  05/02/2023 1.31 (H) 0.57 - 1.00 mg/dL Final         Failed - K in normal range and within 180 days    Potassium  Date Value Ref Range Status  05/02/2023 3.7 3.5 - 5.2 mmol/L Final  01/24/2014 3.4 (L) 3.5 - 5.1 mmol/L Final         Passed - Patient is not pregnant      Passed - Last BP in normal range    BP Readings from Last 1 Encounters:  01/19/24 131/76         Passed - Valid encounter within last 6 months    Recent Outpatient Visits           3 weeks ago Type 2 diabetes mellitus with morbid obesity (HCC)   [place] [facility] [place], [place] T, NP   3 months ago Type 2 diabetes mellitus with morbid obesity (HCC)   [place] [facility] [place], [place] T, NP   6 months ago Type 2 diabetes mellitus with morbid obesity (HCC)   [place] [facility] [place], Jolene T, NP              Refused Prescriptions Disp Refills   amLODipine  (NORVASC ) 2.5 MG tablet 90 tablet 4    Sig: Take 1 tablet (2.5 mg total) by mouth daily.     Cardiovascular: Calcium  Channel Blockers 2 Passed - 02/09/2024 12:59 PM      Passed - Last BP in normal range    BP Readings from Last 1 Encounters:  01/19/24 131/76         Passed - Last Heart Rate in normal range    Pulse Readings from Last 1 Encounters:  01/19/24 65         Passed - Valid encounter within last 6 months    Recent Outpatient Visits           3 weeks ago Type 2 diabetes mellitus with morbid obesity (HCC)    [place] [facility] [place], [place] T, NP   3 months ago Type 2 diabetes mellitus with morbid obesity (HCC)   [place] [facility] [place], [employer] [place] T, NP   6 months ago Type 2 diabetes mellitus with morbid obesity (HCC)    [facility] [place], Melanie DASEN, NP

## 2024-02-10 DIAGNOSIS — E7849 Other hyperlipidemia: Secondary | ICD-10-CM | POA: Diagnosis not present

## 2024-02-10 DIAGNOSIS — R0609 Other forms of dyspnea: Secondary | ICD-10-CM | POA: Diagnosis not present

## 2024-02-10 DIAGNOSIS — I10 Essential (primary) hypertension: Secondary | ICD-10-CM | POA: Diagnosis not present

## 2024-02-10 DIAGNOSIS — R9431 Abnormal electrocardiogram [ECG] [EKG]: Secondary | ICD-10-CM | POA: Diagnosis not present

## 2024-02-10 DIAGNOSIS — I209 Angina pectoris, unspecified: Secondary | ICD-10-CM | POA: Diagnosis not present

## 2024-02-10 DIAGNOSIS — I471 Supraventricular tachycardia, unspecified: Secondary | ICD-10-CM | POA: Diagnosis not present

## 2024-02-10 DIAGNOSIS — I3139 Other pericardial effusion (noninflammatory): Secondary | ICD-10-CM | POA: Diagnosis not present

## 2024-02-10 DIAGNOSIS — I4729 Other ventricular tachycardia: Secondary | ICD-10-CM | POA: Diagnosis not present

## 2024-02-10 DIAGNOSIS — N1832 Chronic kidney disease, stage 3b: Secondary | ICD-10-CM | POA: Diagnosis not present

## 2024-02-27 DIAGNOSIS — E7849 Other hyperlipidemia: Secondary | ICD-10-CM | POA: Diagnosis not present

## 2024-02-27 DIAGNOSIS — I209 Angina pectoris, unspecified: Secondary | ICD-10-CM | POA: Diagnosis not present

## 2024-02-27 DIAGNOSIS — I4729 Other ventricular tachycardia: Secondary | ICD-10-CM | POA: Diagnosis not present

## 2024-02-27 DIAGNOSIS — I471 Supraventricular tachycardia, unspecified: Secondary | ICD-10-CM | POA: Diagnosis not present

## 2024-02-27 DIAGNOSIS — I129 Hypertensive chronic kidney disease with stage 1 through stage 4 chronic kidney disease, or unspecified chronic kidney disease: Secondary | ICD-10-CM | POA: Diagnosis not present

## 2024-02-27 DIAGNOSIS — R9431 Abnormal electrocardiogram [ECG] [EKG]: Secondary | ICD-10-CM | POA: Diagnosis not present

## 2024-02-27 DIAGNOSIS — I3139 Other pericardial effusion (noninflammatory): Secondary | ICD-10-CM | POA: Diagnosis not present

## 2024-02-27 DIAGNOSIS — R0609 Other forms of dyspnea: Secondary | ICD-10-CM | POA: Diagnosis not present

## 2024-02-27 DIAGNOSIS — I4891 Unspecified atrial fibrillation: Secondary | ICD-10-CM | POA: Diagnosis not present

## 2024-02-27 DIAGNOSIS — N1832 Chronic kidney disease, stage 3b: Secondary | ICD-10-CM | POA: Diagnosis not present

## 2024-03-19 ENCOUNTER — Other Ambulatory Visit: Payer: Self-pay | Admitting: Nurse Practitioner

## 2024-03-20 ENCOUNTER — Other Ambulatory Visit: Payer: Self-pay | Admitting: Nurse Practitioner

## 2024-03-20 NOTE — Telephone Encounter (Signed)
 Requested Prescriptions  Pending Prescriptions Disp Refills   DULoxetine  (CYMBALTA ) 60 MG capsule [Pharmacy Med Name: DULOXETINE  DR 60MG  CAPSULES] 90 capsule 0    Sig: TAKE 1 CAPSULE(60 MG) BY MOUTH DAILY     Psychiatry: Antidepressants - SNRI - duloxetine  Failed - 03/20/2024  3:15 PM      Failed - Cr in normal range and within 360 days    Creatinine  Date Value Ref Range Status  01/24/2014 0.86 0.60 - 1.30 mg/dL Final   Creatinine, Ser  Date Value Ref Range Status  05/02/2023 1.31 (H) 0.57 - 1.00 mg/dL Final         Passed - eGFR is 30 or above and within 360 days    EGFR (African American)  Date Value Ref Range Status  01/24/2014 >60  Final   GFR calc Af Amer  Date Value Ref Range Status  03/19/2017 >60 >60 mL/min Final    Comment:    (NOTE) The eGFR has been calculated using the CKD EPI equation. This calculation has not been validated in all clinical situations. eGFR's persistently <60 mL/min signify possible Chronic Kidney Disease.    EGFR (Non-African Amer.)  Date Value Ref Range Status  01/24/2014 >60  Final    Comment:    eGFR values <72mL/min/1.73 m2 may be an indication of chronic kidney disease (CKD). Calculated eGFR is useful in patients with stable renal function. The eGFR calculation will not be reliable in acutely ill patients when serum creatinine is changing rapidly. It is not useful in  patients on dialysis. The eGFR calculation may not be applicable to patients at the low and high extremes of body sizes, pregnant women, and vegetarians.    GFR, Estimated  Date Value Ref Range Status  04/05/2022 33 (L) >60 mL/min Final    Comment:    (NOTE) Calculated using the CKD-EPI Creatinine Equation (2021)    eGFR  Date Value Ref Range Status  05/02/2023 43 (L) >59 mL/min/1.73 Final         Passed - Completed PHQ-2 or PHQ-9 in the last 360 days      Passed - Last BP in normal range    BP Readings from Last 1 Encounters:  01/19/24 131/76          Passed - Valid encounter within last 6 months    Recent Outpatient Visits           2 months ago Type 2 diabetes mellitus with morbid obesity (HCC)   [place] [facility] [place], [place] T, NP   5 months ago Type 2 diabetes mellitus with morbid obesity (HCC)   [place] [facility] [place], [place] T, NP   8 months ago Type 2 diabetes mellitus with morbid obesity (HCC)   [place] [facility] [place], Melanie DASEN, NP

## 2024-03-22 ENCOUNTER — Telehealth: Payer: Self-pay

## 2024-03-22 NOTE — Telephone Encounter (Signed)
 Requested Prescriptions  Refused Prescriptions Disp Refills   DULoxetine  (CYMBALTA ) 60 MG capsule [Pharmacy Med Name: DULOXETINE  DR 60MG  CAPSULES] 90 capsule 0    Sig: TAKE 1 CAPSULE(60 MG) BY MOUTH DAILY     Psychiatry: Antidepressants - SNRI - duloxetine  Failed - 03/22/2024 11:24 AM      Failed - Cr in normal range and within 360 days    Creatinine  Date Value Ref Range Status  01/24/2014 0.86 0.60 - 1.30 mg/dL Final   Creatinine, Ser  Date Value Ref Range Status  05/02/2023 1.31 (H) 0.57 - 1.00 mg/dL Final         Passed - eGFR is 30 or above and within 360 days    EGFR (African American)  Date Value Ref Range Status  01/24/2014 >60  Final   GFR calc Af Amer  Date Value Ref Range Status  03/19/2017 >60 >60 mL/min Final    Comment:    (NOTE) The eGFR has been calculated using the CKD EPI equation. This calculation has not been validated in all clinical situations. eGFR's persistently <60 mL/min signify possible Chronic Kidney Disease.    EGFR (Non-African Amer.)  Date Value Ref Range Status  01/24/2014 >60  Final    Comment:    eGFR values <32mL/min/1.73 m2 may be an indication of chronic kidney disease (CKD). Calculated eGFR is useful in patients with stable renal function. The eGFR calculation will not be reliable in acutely ill patients when serum creatinine is changing rapidly. It is not useful in  patients on dialysis. The eGFR calculation may not be applicable to patients at the low and high extremes of body sizes, pregnant women, and vegetarians.    GFR, Estimated  Date Value Ref Range Status  04/05/2022 33 (L) >60 mL/min Final    Comment:    (NOTE) Calculated using the CKD-EPI Creatinine Equation (2021)    eGFR  Date Value Ref Range Status  05/02/2023 43 (L) >59 mL/min/1.73 Final         Passed - Completed PHQ-2 or PHQ-9 in the last 360 days      Passed - Last BP in normal range    BP Readings from Last 1 Encounters:  01/19/24 131/76          Passed - Valid encounter within last 6 months    Recent Outpatient Visits           2 months ago Type 2 diabetes mellitus with morbid obesity (HCC)   [place] [facility] [place], Allen T, NP   5 months ago Type 2 diabetes mellitus with morbid obesity (HCC)   [place] [facility] [place], [place] T, NP   8 months ago Type 2 diabetes mellitus with morbid obesity (HCC)   [place] [facility] [place], Melanie DASEN, NP

## 2024-03-23 NOTE — Progress Notes (Signed)
   03/23/2024  Patient ID: Cindy [name], female   DOB: [birth year], 73 y.o.   MRN: [id number]  Returning missed call/voicemail from patient in regard to insurance and patient assistance program concerns.  Patient will no longer be eligible to receive Ozempic  through [facility] P[facility] starting January 2026.  She currently has 6 pens on hand, which will get her through till approximately May.  She was denied LIS Medicare Extra Help in 2024 based on HHI/resources, so I advised to contact insurance to check on mediation coverage and compare plan options during Medicare open enrollment.    She has also received a letter from [facility] stating PCP, Jolene Cannady, will no longer be in network.  Patient received a similar letter several months ago, and this was not correct.  Messaging PCP/office staff to make aware and see if they know anything about this.  Cindy [name], PharmD, DPLA

## 2024-04-02 NOTE — Telephone Encounter (Signed)
 Tried calling patient at home,  No answer, no voicemail.  Patient requesting refill too soon.  Last filled for #90, rx 3 in September 2025.  Patient does not use my chart.  Will attempt to call her back again later.

## 2024-04-02 NOTE — Telephone Encounter (Signed)
 Patient is requesting the following refill Requested Prescriptions   Pending Prescriptions Disp Refills  . metoPROLOL  succinate (TOPROL -XL) 50 MG 24 hr tablet [Pharmacy Med Name: METOPROLOL  ER SUCCINATE 50MG  TABS] 30 tablet 11    Sig: TAKE 1 TABLET(50 MG) BY MOUTH DAILY    Recent Visits Date Type Provider Dept  02/10/24 Office Visit Jama Old, MD [facility]  08/17/23 Office Visit Jama Old, MD [facility]  05/04/23 Office Visit [facility], [facility]  04/06/23 Office Visit [facility], [facility]  Showing recent visits within past 365 days and meeting all other requirements Future Appointments Date Type Provider Dept  05/16/24 Appointment Jama Old, MD [facility]  Showing future appointments within next 365 days and meeting all other requirements    Labs: Vitals:  BP Readings from Last 3 Encounters:  02/27/24 143/50  02/10/24 145/65  08/17/23 138/61   and  Pulse Readings from Last 3 Encounters:  02/27/24 92  02/10/24 74  08/17/23 75

## 2024-04-02 NOTE — Telephone Encounter (Signed)
 Spoke to patient.  Confirmed patient now takes 100mg  metoprolol .  Refill is for 50mg .  Will refuse refill.  Patient was appreciative of call.

## 2024-04-15 ENCOUNTER — Ambulatory Visit
Admission: EM | Admit: 2024-04-15 | Discharge: 2024-04-15 | Disposition: A | Discharge status: Home / Self Care | Attending: Nurse Practitioner | Admitting: Nurse Practitioner

## 2024-04-15 DIAGNOSIS — L02415 Cutaneous abscess of right lower limb: Secondary | ICD-10-CM

## 2024-04-15 DIAGNOSIS — B9689 Other specified bacterial agents as the cause of diseases classified elsewhere: Secondary | ICD-10-CM | POA: Diagnosis not present

## 2024-04-15 DIAGNOSIS — J019 Acute sinusitis, unspecified: Secondary | ICD-10-CM | POA: Diagnosis not present

## 2024-04-15 MED ORDER — DOXYCYCLINE HYCLATE 100 MG PO CAPS: 100.0000 mg | ORAL_CAPSULE | Freq: Two times a day (BID) | ORAL | 0 refills | Status: AC

## 2024-04-15 NOTE — ED Provider Notes (Signed)
 [facility]    CSN: [id number] Arrival date & time: 04/15/24  0809      History   Chief Complaint Chief Complaint  Patient presents with   Fever   Diarrhea   Nasal Congestion   Generalized Body Aches   Mass    HPI Cindy [name] is a 73 y.o. female.   Patient presents today with 1 week history of sinus congestion, facial pressure, and headache.  Reports intermittent fever as well.  No significant cough, chest pain, or worsening shortness of breath more than baseline.  She reports blood sugars have been in the low 100s, last checked a few days ago.  Has also been dealing with boil on right thigh for the past 2 weeks or so.  Reports frequent boils her whole life and has had to see wound care for a couple of them, last was more than 2 years ago.  No nausea or vomiting.  She reports she woke up this morning feeling worse and came into urgent care.    Past Medical History:  Diagnosis Date   Chronic pain syndrome    Depression    Diabetes mellitus without complication (HCC)    GERD (gastroesophageal reflux disease)    History of hiatal hernia    Hypertension    Iron deficiency anemia    Microalbuminuric diabetic nephropathy (HCC)    Mitral regurgitation    Obesity    Shortness of breath dyspnea    Sleep apnea     Patient Active Problem List   Diagnosis Date Noted   Diabetes mellitus treated with injections of non-insulin  medication (HCC) 07/10/2023   Pericardial effusion 04/10/2023   Chronic right-sided low back pain with right-sided sciatica 06/25/2022   Left ventricular hypertrophy 06/21/2021   Primary hyperparathyroidism 03/14/2021   Recurrent boils 12/03/2020   Morbid obesity (HCC) 12/03/2020   CKD stage 4 due to type 2 diabetes mellitus (HCC) 12/01/2020   Aortic atherosclerosis 12/01/2020   Hypertension associated with diabetes (HCC) 07/15/2020   Paroxysmal atrial fibrillation (HCC) 07/15/2020   Chronic pain syndrome 07/15/2020   Depression,  major, single episode, moderate (HCC) 07/15/2020   Type 2 diabetes mellitus with morbid obesity (HCC) 07/15/2020   GERD (gastroesophageal reflux disease) 07/15/2020   Hyperlipidemia associated with type 2 diabetes mellitus (HCC) 07/15/2020   OSA (obstructive sleep apnea) 07/15/2020   Other thrombophilia 03/12/2020   History of lumbar spinal fusion 04/26/2018   Hypertensive retinopathy of both eyes 06/08/2017   DDD (degenerative disc disease), lumbar 10/31/2014   Cervical spondylosis with myelopathy 02/22/2014   Anemia in chronic kidney disease (CKD) 02/22/2014   Mitral regurgitation 09/08/2013    Past Surgical History:  Procedure Laterality Date   ABDOMINAL HYSTERECTOMY     BACK SURGERY     BREAST BIOPSY Right 03/2003   CARDIAC CATHETERIZATION N/A 10/10/2014   Procedure: Left Heart Cath;  Surgeon: Marsa Dooms, MD;  Location: [facility] INVASIVE [facility];  Service: Cardiovascular;  Laterality: N/A;   CARDIAC CATHETERIZATION     carpal tunnell release  10/23/11   COLONOSCOPY WITH PROPOFOL  N/A 02/20/2015   Procedure: COLONOSCOPY WITH PROPOFOL ;  Surgeon: Deward CINDERELLA Piedmont, MD;  Location: [facility] ENDOSCOPY;  Service: Gastroenterology;  Laterality: N/A;   COLONOSCOPY WITH PROPOFOL  N/A 04/26/2017   Procedure: COLONOSCOPY WITH PROPOFOL ;  Surgeon: Toledo, Ladell POUR, MD;  Location: [facility] ENDOSCOPY;  Service: Gastroenterology;  Laterality: N/A;   ESOPHAGOGASTRODUODENOSCOPY (EGD) WITH PROPOFOL  N/A 02/20/2015   Procedure: ESOPHAGOGASTRODUODENOSCOPY (EGD) WITH PROPOFOL ;  Surgeon: Deward CINDERELLA  Ora, MD;  Location: [facility] ENDOSCOPY;  Service: Gastroenterology;  Laterality: N/A;   ESOPHAGOGASTRODUODENOSCOPY (EGD) WITH PROPOFOL  N/A 04/26/2017   Procedure: ESOPHAGOGASTRODUODENOSCOPY (EGD) WITH PROPOFOL ;  Surgeon: Toledo, Ladell POUR, MD;  Location: [facility] ENDOSCOPY;  Service: Gastroenterology;  Laterality: N/A;   HERNIA REPAIR     LAPAROSCOPIC OVARIAN CYSTECTOMY  08/2001   nissen fundoplycation  11/99   NOSE SURGERY      OB  History   No obstetric history on file.      Home Medications    Prior to Admission medications   Medication Sig Start Date End Date Taking? Authorizing Provider  acetaminophen  (TYLENOL ) 650 MG CR tablet Take 650 mg by mouth every 8 (eight) hours as needed for pain.   Yes [provider]  amLODipine  (NORVASC ) 2.5 MG tablet Take 1 tablet (2.5 mg total) by mouth daily. 05/18/23  Yes Cannady, Jolene T, NP  apixaban  (ELIQUIS ) 5 MG TABS tablet Take 1 tablet (5 mg total) by mouth 2 (two) times daily. Please place on hold until patient requests refill. 09/08/23  Yes Cannady, Jolene T, NP  atorvastatin  (LIPITOR) 80 MG tablet Take 1 tablet (80 mg total) by mouth daily. 04/14/23  Yes Cannady, Jolene T, NP  azelastine  (ASTELIN ) 0.1 % nasal spray SPRAY ONCE IN EACH NOSTRIL TWICE DAILY AS DIRECTED 02/02/24  Yes Cannady, Jolene T, NP  busPIRone  (BUSPAR ) 5 MG tablet Take 1 tablet (5 mg total) by mouth 2 (two) times daily. Patient taking differently: Take 5 mg by mouth 2 (two) times daily. Taking bid prn 04/13/23  Yes Cannady, Jolene T, NP  calcitRIOL (ROCALTROL) 0.25 MCG capsule Take 0.25 mcg by mouth daily. 10/12/22  Yes [provider]  cetirizine (ZYRTEC) 10 MG tablet Take 10 mg by mouth daily.   Yes [provider]  diclofenac Sodium (VOLTAREN ARTHRITIS PAIN) 1 % GEL Apply 2 g topically 4 (four) times daily as needed.   Yes [provider]  doxycycline  (VIBRAMYCIN ) 100 MG capsule Take 1 capsule (100 mg total) by mouth 2 (two) times daily for 7 days. 04/15/24 04/22/24 Yes Chandra Raisin A, NP  DULoxetine  (CYMBALTA ) 60 MG capsule TAKE 1 CAPSULE(60 MG) BY MOUTH DAILY 03/20/24  Yes Cannady, Jolene T, NP  empagliflozin  (JARDIANCE ) 25 MG TABS tablet Take 1 tablet (25 mg total) by mouth daily before breakfast. 02/08/23  Yes Cannady, Jolene T, NP  ferrous sulfate  (FEROSUL) 325 (65 FE) MG tablet Take 1 tablet (325 mg total) by mouth daily. 12/30/23  Yes Cannady, Jolene T, NP   fluticasone  (FLONASE ) 50 MCG/ACT nasal spray SHAKE LIQUID AND USE 2 SPRAYS IN EACH NOSTRIL DAILY 03/08/23  Yes Cannady, Jolene T, NP  furosemide  (LASIX ) 80 MG tablet TAKE 1 TABLET BY MOUTH TWICE DAILY FOR FLUID RETENTION 04/11/23  Yes Cannady, Jolene T, NP  glucose blood test strip To check blood sugar twice a day with goal fasting in morning <130 and goal 2 hours after meal <180.  Write all checks down for provider visits. 10/19/23  Yes Cannady, Jolene T, NP  Metamucil Fiber CHEW Chew 1 tablet by mouth daily.   Yes [provider]  metoprolol  succinate (TOPROL -XL) 100 MG 24 hr tablet Take 100 mg by mouth daily. 02/10/24 02/09/25 Yes [provider]  minoxidil  (LONITEN ) 10 MG tablet TAKE ONE TABLET BY MOUTH ONCE DAILY FOR BLOOD PRESSURE 04/13/23  Yes Cannady, Jolene T, NP  montelukast  (SINGULAIR ) 10 MG tablet Take 1 tablet (10 mg total) by mouth at bedtime. 04/13/23  Yes Cannady,  Jolene T, NP  olmesartan  (BENICAR ) 40 MG tablet Take 1 tablet (40 mg total) by mouth daily. 02/09/24  Yes Cannady, Jolene T, NP  omeprazole (PRILOSEC) 20 MG capsule Take 20 mg by mouth daily as needed (heartburn).    Yes [provider]  Semaglutide ,0.25 or 0.5MG /DOS, (OZEMPIC , 0.25 OR 0.5 MG/DOSE,) 2 MG/1.5ML SOPN Inject 0.5 mg into the skin once a week. 12/02/21  Yes Cannady, Jolene T, NP  sennosides-docusate sodium  (SENOKOT-S) 8.6-50 MG tablet Take 2 tablets by mouth daily.   Yes [provider]  spironolactone  (ALDACTONE ) 25 MG tablet TAKE 2 TABLETS(50 MG) BY MOUTH DAILY 10/25/23  Yes Cannady, Jolene T, NP  tiZANidine  (ZANAFLEX ) 2 MG tablet Take 1 tablet (2 mg total) by mouth every 6 (six) hours as needed for muscle spasms. 07/15/23  Yes Cannady, Jolene T, NP  mupirocin  ointment (BACTROBAN ) 2 % Apply 1 Application topically 2 (two) times daily. 09/29/22   Valerio Melanie DASEN, NP    Family History Family History  Problem Relation Age of Onset   Heart attack Mother    Hypertension Father     Stroke Father    Heart attack Brother     Social History Social History   Tobacco Use   Smoking status: Never   Smokeless tobacco: Current    Types: Snuff, Chew  Vaping Use   Vaping status: Never Used  Substance Use Topics   Alcohol use: No   Drug use: No     Allergies   Bupropion, Gabapentin, Pioglitazone, Vicodin [hydrocodone-acetaminophen ], and Vicoprofen [hydrocodone-ibuprofen]   Review of Systems Review of Systems Per HPI  Physical Exam Triage Vital Signs ED Triage Vitals  Encounter Vitals Group     BP 04/15/24 0824 (!) 149/52     Girls Systolic BP Percentile --      Girls Diastolic BP Percentile --      Boys Systolic BP Percentile --      Boys Diastolic BP Percentile --      Pulse Rate 04/15/24 0824 75     Resp 04/15/24 0824 17     Temp 04/15/24 0824 (!) 100.4 F (38 C)     Temp Source 04/15/24 0824 Oral     SpO2 04/15/24 0824 97 %     Weight 04/15/24 0823 202 lb (91.6 kg)     Height --      Head Circumference --      Peak Flow --      Pain Score 04/15/24 0822 9     Pain Loc --      Pain Education --      Exclude from Growth Chart --    No data found.  Updated Vital Signs BP (!) 149/52 (BP Location: Left Wrist)   Pulse 75   Temp (!) 100.4 F (38 C) (Oral) Comment: patient took 1 g in room of tylenol   Resp 17   Wt 202 lb (91.6 kg)   SpO2 97%   BMI 38.17 kg/m   Visual Acuity Right Eye Distance:   Left Eye Distance:   Bilateral Distance:    Right Eye Near:   Left Eye Near:    Bilateral Near:     Physical Exam Vitals and nursing note reviewed.  Constitutional:      General: She is not in acute distress.    Appearance: Normal appearance. She is not ill-appearing or toxic-appearing.  HENT:     Head: Normocephalic and atraumatic.     Right Ear: Tympanic membrane, ear canal  and external ear normal.     Left Ear: Tympanic membrane, ear canal and external ear normal.     Nose: Congestion and rhinorrhea present.     Right Sinus: Maxillary  sinus tenderness present. No frontal sinus tenderness.     Left Sinus: Maxillary sinus tenderness present. No frontal sinus tenderness.     Mouth/Throat:     Mouth: Mucous membranes are moist.     Pharynx: Oropharynx is clear. No oropharyngeal exudate or posterior oropharyngeal erythema.  Eyes:     General: No scleral icterus.    Extraocular Movements: Extraocular movements intact.  Cardiovascular:     Rate and Rhythm: Normal rate and regular rhythm.  Pulmonary:     Effort: Pulmonary effort is normal. No respiratory distress.     Breath sounds: Normal breath sounds. No wheezing, rhonchi or rales.  Musculoskeletal:     Cervical back: Normal range of motion and neck supple.  Lymphadenopathy:     Cervical: No cervical adenopathy.  Skin:    General: Skin is warm and dry.     Capillary Refill: Capillary refill takes less than 2 seconds.     Coloration: Skin is not jaundiced or pale.     Findings: Abscess present. No erythema or rash.     Comments: Fluctuant abscess right posterior thigh approximately 4 x 7 cm.  No active drainage.  Neurological:     Mental Status: She is alert and oriented to person, place, and time.  Psychiatric:        Behavior: Behavior is cooperative.         UC Treatments / Results  Labs (all labs ordered are listed, but only abnormal results are displayed) Labs Reviewed - No data to display  EKG   Radiology No results found.  Procedures Incision and Drainage  Date/Time: 04/15/2024 9:44 AM  Performed by: Chandra Harlene LABOR, NP Authorized by: Chandra Harlene LABOR, NP   Consent:    Consent obtained:  Verbal   Consent given by:  Patient   Risks, benefits, and alternatives were discussed: yes     Risks discussed:  Bleeding, incomplete drainage, pain, infection and damage to other organs Universal protocol:    Procedure explained and questions answered to patient or proxy's satisfaction: yes     Patient identity confirmed:  Verbally with  patient Location:    Type:  Abscess   Size:  4 x 7 cm   Location:  Lower extremity   Lower extremity location:  Leg   Leg location:  R upper leg Pre-procedure details:    Skin preparation:  Antiseptic wash and chlorhexidine with alcohol Anesthesia:    Anesthesia method:  Topical application and local infiltration   Topical anesthesia: freeze spray.   Local anesthetic:  Lidocaine  2% WITH epi Procedure type:    Complexity:  Simple Procedure details:    Ultrasound guidance: no     Needle aspiration: no     Incision types:  Stab incision   Incision depth:  Dermal   Drainage:  Purulent and bloody   Drainage amount:  Moderate   Wound treatment:  Wound left open   Packing materials:  None Post-procedure details:    Procedure completion:  Tolerated well, no immediate complications  (including critical care time)  Medications Ordered in UC Medications - No data to display  Initial Impression / Assessment and Plan / UC Course  I have reviewed the triage vital signs and the nursing notes.  Pertinent labs & imaging results that  were available during my care of the patient were reviewed by me and considered in my medical decision making (see chart for details).   In triage, patient is febrile and hypertensive, otherwise vital signs are stable.  She has a fluctuant abscess to the right thigh; I&D was performed as above.  Patient tolerated well.  Wound care was discussed.  Additionally, she also has a bacterial sinus infection.  Will treat both with doxycycline  100 mg twice daily for 7 days.  Supportive care was discussed with the patient including hydration, Mucinex  600 mg twice daily, and other supportive measures.  I recommended close follow-up with primary care provider within 48 hours for recheck of the abscess.  Strict ER precautions were discussed if she develops fever, severe pain, or nausea/vomiting sooner.  The patient was given the opportunity to ask questions.  All questions  answered to their satisfaction.  The patient is in agreement to this plan.   Final Clinical Impressions(s) / UC Diagnoses   Final diagnoses:  Acute bacterial sinusitis  Abscess of right thigh     Discharge Instructions      Take the doxycycline  as prescribed.  Take with food and stay upright 30 minutes after taking it.  This will treat both the abscess and the sinus infection.  In addition to the allergy medicine, for sinus infection recommend plenty fluids and Mucinex  600 mg twice daily to help with the congestion.  For the abscess, please change the dressing over top of it twice daily or as needed for soiling.  Clean the area with soap and water after you remove the dressing and apply a clean dressing.  Please follow-up with your PCPs office in the next 48 hours or so for recheck.  If you develop high fever, nausea/vomiting and are unable to keep fluids down sooner than your appointment, please seek care emergently.   ED Prescriptions     Medication Sig Dispense Auth. Provider   doxycycline  (VIBRAMYCIN ) 100 MG capsule Take 1 capsule (100 mg total) by mouth 2 (two) times daily for 7 days. 14 capsule Chandra Harlene LABOR, NP      PDMP not reviewed this encounter.   Chandra Harlene LABOR, NP 04/15/24 [phone]

## 2024-04-15 NOTE — Discharge Instructions (Addendum)
 Take the doxycycline  as prescribed.  Take with food and stay upright 30 minutes after taking it.  This will treat both the abscess and the sinus infection.  In addition to the allergy medicine, for sinus infection recommend plenty fluids and Mucinex  600 mg twice daily to help with the congestion.  For the abscess, please change the dressing over top of it twice daily or as needed for soiling.  Clean the area with soap and water after you remove the dressing and apply a clean dressing.  Please follow-up with your PCPs office in the next 48 hours or so for recheck.  If you develop high fever, nausea/vomiting and are unable to keep fluids down sooner than your appointment, please seek care emergently.

## 2024-04-15 NOTE — ED Triage Notes (Addendum)
 Patient states that she's had sx x 1 week  Nasal congestion Fatigue Bodyaches Fever; patient took 1 g in the room of tylenol   Diarrhea    Boil on right thigh

## 2024-04-16 ENCOUNTER — Telehealth: Payer: Self-pay

## 2024-04-16 NOTE — Telephone Encounter (Signed)
 Gave pt a call pt is coming up due for re-enrollment on [place] Jardiance , spoke with pt ,she wants pap to be mail out and will bring it to provider office on her next appt on 11/24 will ask for [facility]. Faxed provider portion today

## 2024-04-18 ENCOUNTER — Other Ambulatory Visit: Payer: Self-pay | Admitting: Nurse Practitioner

## 2024-04-20 NOTE — Telephone Encounter (Signed)
 Requested medication (s) are due for refill today: Yes  Requested medication (s) are on the active medication list: Yes  Last refill:  04/13/23  Future visit scheduled: Yes  Notes to clinic:  Not delegated.    Requested Prescriptions  Pending Prescriptions Disp Refills   minoxidil  (LONITEN ) 10 MG tablet [Pharmacy Med Name: MINOXIDIL  10MG  TABLETS] 90 tablet 4    Sig: TAKE 1 TABLET BY MOUTH DAILY FOR BLOOD PRESSURE     Not Delegated - Cardiovascular: Vasodilators - minoxidil  Failed - 04/20/2024 12:48 PM      Failed - This refill cannot be delegated      Failed - Na in normal range and within 360 days    Sodium  Date Value Ref Range Status  05/02/2023 145 (H) 134 - 144 mmol/L Final  01/24/2014 143 136 - 145 mmol/L Final         Failed - Last BP in normal range    BP Readings from Last 1 Encounters:  04/15/24 (!) 149/52         Passed - Cl in normal range and within 360 days    Chloride  Date Value Ref Range Status  05/02/2023 102 96 - 106 mmol/L Final  01/24/2014 105 98 - 107 mmol/L Final         Passed - K in normal range and within 360 days    Potassium  Date Value Ref Range Status  05/02/2023 3.7 3.5 - 5.2 mmol/L Final  01/24/2014 3.4 (L) 3.5 - 5.1 mmol/L Final         Passed - Last Heart Rate in normal range    Pulse Readings from Last 1 Encounters:  04/15/24 75         Passed - Valid encounter within last 3 months    Recent Outpatient Visits           3 months ago Type 2 diabetes mellitus with morbid obesity (HCC)   [place] [facility] [place], [place] T, NP   6 months ago Type 2 diabetes mellitus with morbid obesity (HCC)   [place] [facility] [place], [place] T, NP   9 months ago Type 2 diabetes mellitus with morbid obesity (HCC)   [place] [facility] [place] [place], [place] T, NP              Passed - Weight completed in the last 12 months    Wt Readings from Last 1 Encounters:  04/15/24 202 lb (91.6  kg)

## 2024-04-21 NOTE — Patient Instructions (Signed)

## 2024-04-22 ENCOUNTER — Other Ambulatory Visit: Payer: Self-pay | Admitting: Nurse Practitioner

## 2024-04-22 DIAGNOSIS — I152 Hypertension secondary to endocrine disorders: Secondary | ICD-10-CM

## 2024-04-23 ENCOUNTER — Telehealth: Payer: Self-pay | Admitting: Nurse Practitioner

## 2024-04-23 ENCOUNTER — Encounter: Payer: Self-pay | Admitting: Nurse Practitioner

## 2024-04-23 ENCOUNTER — Ambulatory Visit: Admitting: Nurse Practitioner

## 2024-04-23 DIAGNOSIS — G4733 Obstructive sleep apnea (adult) (pediatric): Secondary | ICD-10-CM

## 2024-04-23 DIAGNOSIS — G894 Chronic pain syndrome: Secondary | ICD-10-CM | POA: Diagnosis not present

## 2024-04-23 DIAGNOSIS — E1159 Type 2 diabetes mellitus with other circulatory complications: Secondary | ICD-10-CM

## 2024-04-23 DIAGNOSIS — E210 Primary hyperparathyroidism: Secondary | ICD-10-CM

## 2024-04-23 DIAGNOSIS — I152 Hypertension secondary to endocrine disorders: Secondary | ICD-10-CM | POA: Diagnosis not present

## 2024-04-23 DIAGNOSIS — E1122 Type 2 diabetes mellitus with diabetic chronic kidney disease: Secondary | ICD-10-CM

## 2024-04-23 DIAGNOSIS — E785 Hyperlipidemia, unspecified: Secondary | ICD-10-CM | POA: Diagnosis not present

## 2024-04-23 DIAGNOSIS — Z7985 Long-term (current) use of injectable non-insulin antidiabetic drugs: Secondary | ICD-10-CM

## 2024-04-23 DIAGNOSIS — F321 Major depressive disorder, single episode, moderate: Secondary | ICD-10-CM | POA: Diagnosis not present

## 2024-04-23 DIAGNOSIS — E119 Type 2 diabetes mellitus without complications: Secondary | ICD-10-CM

## 2024-04-23 DIAGNOSIS — E1169 Type 2 diabetes mellitus with other specified complication: Secondary | ICD-10-CM | POA: Diagnosis not present

## 2024-04-23 DIAGNOSIS — I480 Paroxysmal atrial fibrillation: Secondary | ICD-10-CM

## 2024-04-23 DIAGNOSIS — Z23 Encounter for immunization: Secondary | ICD-10-CM

## 2024-04-23 DIAGNOSIS — N184 Chronic kidney disease, stage 4 (severe): Secondary | ICD-10-CM

## 2024-04-23 DIAGNOSIS — N1832 Chronic kidney disease, stage 3b: Secondary | ICD-10-CM

## 2024-04-23 LAB — BAYER DCA HB A1C WAIVED: HB A1C (BAYER DCA - WAIVED): 5.6 % (ref 4.8–5.6)

## 2024-04-23 MED ORDER — SPIRONOLACTONE 25 MG PO TABS: ORAL_TABLET | ORAL | 1 refills | Status: AC

## 2024-04-23 MED ORDER — MONTELUKAST SODIUM 10 MG PO TABS
10.0000 mg | ORAL_TABLET | Freq: Every day | ORAL | 4 refills | Status: AC
Start: 2024-04-23 — End: ?

## 2024-04-23 MED ORDER — DULOXETINE HCL 60 MG PO CPEP: 60.0000 mg | ORAL_CAPSULE | Freq: Every day | ORAL | 4 refills | Status: AC

## 2024-04-23 MED ORDER — AMLODIPINE BESYLATE 2.5 MG PO TABS: 2.5000 mg | ORAL_TABLET | Freq: Every day | ORAL | 4 refills | Status: AC

## 2024-04-23 MED ORDER — ATORVASTATIN CALCIUM 80 MG PO TABS: 80.0000 mg | ORAL_TABLET | Freq: Every day | ORAL | 4 refills | Status: AC

## 2024-04-23 MED ORDER — FUROSEMIDE 80 MG PO TABS: ORAL_TABLET | ORAL | 4 refills | Status: AC

## 2024-04-23 MED ORDER — BUSPIRONE HCL 5 MG PO TABS: 5.0000 mg | ORAL_TABLET | Freq: Two times a day (BID) | ORAL | 4 refills | Status: AC | PRN

## 2024-04-23 MED ORDER — PREDNISONE 20 MG PO TABS: 40.0000 mg | ORAL_TABLET | Freq: Every day | ORAL | 0 refills | Status: AC

## 2024-04-23 NOTE — Telephone Encounter (Signed)
 Too soon for refill, refilled 03/2424.  Requested Prescriptions  Pending Prescriptions Disp Refills   spironolactone  (ALDACTONE ) 25 MG tablet [Pharmacy Med Name: SPIRONOLACTONE  25MG  TABLETS] 180 tablet 1    Sig: TAKE 2 TABLETS(50 MG) BY MOUTH DAILY     Cardiovascular: Diuretics - Aldosterone Antagonist Failed - 04/23/2024  4:00 PM      Failed - Cr in normal range and within 180 days    Creatinine  Date Value Ref Range Status  01/24/2014 0.86 0.60 - 1.30 mg/dL Final   Creatinine, Ser  Date Value Ref Range Status  05/02/2023 1.31 (H) 0.57 - 1.00 mg/dL Final         Failed - K in normal range and within 180 days    Potassium  Date Value Ref Range Status  05/02/2023 3.7 3.5 - 5.2 mmol/L Final  01/24/2014 3.4 (L) 3.5 - 5.1 mmol/L Final         Failed - Na in normal range and within 180 days    Sodium  Date Value Ref Range Status  05/02/2023 145 (H) 134 - 144 mmol/L Final  01/24/2014 143 136 - 145 mmol/L Final         Failed - eGFR is 30 or above and within 180 days    EGFR (African American)  Date Value Ref Range Status  01/24/2014 >60  Final   GFR calc Af Amer  Date Value Ref Range Status  03/19/2017 >60 >60 mL/min Final    Comment:    (NOTE) The eGFR has been calculated using the CKD EPI equation. This calculation has not been validated in all clinical situations. eGFR's persistently <60 mL/min signify possible Chronic Kidney Disease.    EGFR (Non-African Amer.)  Date Value Ref Range Status  01/24/2014 >60  Final    Comment:    eGFR values <23mL/min/1.73 m2 may be an indication of chronic kidney disease (CKD). Calculated eGFR is useful in patients with stable renal function. The eGFR calculation will not be reliable in acutely ill patients when serum creatinine is changing rapidly. It is not useful in  patients on dialysis. The eGFR calculation may not be applicable to patients at the low and high extremes of body sizes, pregnant women, and vegetarians.     GFR, Estimated  Date Value Ref Range Status  04/05/2022 33 (L) >60 mL/min Final    Comment:    (NOTE) Calculated using the CKD-EPI Creatinine Equation (2021)    eGFR  Date Value Ref Range Status  05/02/2023 43 (L) >59 mL/min/1.73 Final         Passed - Last BP in normal range    BP Readings from Last 1 Encounters:  04/23/24 130/74         Passed - Valid encounter within last 6 months    Recent Outpatient Visits           Today Type 2 diabetes mellitus with morbid obesity (HCC)   [place] [facility] [place], [place] T, NP   3 months ago Type 2 diabetes mellitus with morbid obesity (HCC)   [place] [facility] [place], [place] T, NP   6 months ago Type 2 diabetes mellitus with morbid obesity (HCC)   [facility] [place], [place] T, NP   9 months ago Type 2 diabetes mellitus with morbid obesity (HCC)   [place] [facility] [place], Melanie DASEN, NP

## 2024-04-23 NOTE — Assessment & Plan Note (Signed)
 Chronic, stable.   Denies SI/HI.  Continue Duloxetine at this time, which also offers benefit to chronic pain and mood, + continue Buspar which has offered her benefit to anxiety.  Refills as needed.  Will adjust dosing or add medication as needed.

## 2024-04-23 NOTE — Assessment & Plan Note (Signed)
 Chronic, ongoing, followed by cardiology.  Continue Eliquis and Metoprolol + collaboration with cardiology team.  Rate controlled.  [facility] to [facility] with [facility].  Recent notes reviewed.

## 2024-04-23 NOTE — Assessment & Plan Note (Signed)
 BMI 38.79 with T2DM, HTN, CKD - continues to maintain weight loss with Ozempic .  Recommended eating smaller high protein, low fat meals more frequently and exercising 30 mins a day 5 times a week with a goal of 10-15lb weight loss in the next 3 months. Patient voiced their understanding and motivation to adhere to these recommendations.

## 2024-04-23 NOTE — Assessment & Plan Note (Signed)
 Chronic, ongoing with A1c 5.6% today and urine ALB 150 February 2025.  Praised for ongoing success.  Discussed that she is in normal A1c range which helps to reduce long term poor outcomes. - Continue Jardiance  25 MG and Ozempic  0.5 MG weekly at this time, as seeing significant benefit with this.  Gets via assistance. Determine next steps for new year as will not have Ozempic  assistance. Has enough supply to last until May 2026. - Maintain off Glimepiride due to her age and risk for hypoglycemia. Plus maintain off Metformin  due to kidney health. - If poor control once at MAX doses of GLP1 and SGLT2, then will consider referral to endo, but at this time will continue to work on this in office which patient agrees with. - Check BS TID and focus on diabetic diet.    - Eye and foot exams up to date. - Vaccines up to date. - Return in 3 months for A1c -- but alert provider prior to if elevations in BS >300 or <70.

## 2024-04-23 NOTE — Assessment & Plan Note (Addendum)
 To back, neck, and shoulders. Will place referral to ortho as would like to have left shoulder looked at, she is concerned that she may need another steroid injection which helped pain in the past. Will provide short burst of Prednisone  and to monitor sugars with this, unsure if pain coming from recent ear infection vs arthritis pain from neck/shoulder area. If any side effects present with steroid to immediately stop taking and alert PCP.

## 2024-04-23 NOTE — Assessment & Plan Note (Signed)
 Chronic, ongoing.  Follows with nephrology.  Continue this collaboration, recent notes and labs reviewed. Labs up to date with them.

## 2024-04-23 NOTE — Assessment & Plan Note (Signed)
 Chronic, ongoing CKD 4.  At this time reduce and renal dose medications as needed + continue collaboration with nephrology.  Labs: A1c and BMP.  Avoid NSAIDs.  Refer to diabetes with obesity plan of care for further.  A1c stable at 5.6% today.

## 2024-04-23 NOTE — Progress Notes (Signed)
 BP 130/74 (BP Location: Left Arm, Patient Position: Sitting, Cuff Size: Large)   Pulse 62   Temp 97.9 F (36.6 C) (Oral)   Resp 15   Ht 5' 0.98 (1.549 m)   Wt 205 lb 3.2 oz (93.1 kg)   SpO2 100%   BMI 38.79 kg/m    Subjective:    Patient ID: Cindy [name], female    DOB: September 15, 1950, 73 y.o.   MRN: [id number]  HPI: Cindy [name] is a 73 y.o. female  Chief Complaint  Patient presents with   Hypertension    130-140 but isn't sure the bottom number.    Diabetes    Morning fasting is usually 70-110.    Depression    Feels it is up and down. Able to take care of herself but doesn't have any energy.    DIABETES August A1c 5.6%. Takes Jardiance  and Ozempic . Works with [employer] [facility] on assistance with Ozempic  and Jardiance , however Ozempic  assistance will not longer be present next year.    Recently treated for sinus infection with Doxycycline  on 04/15/24, continues to have some discomfort to left side ear however this was present slightly before. Discomfort will go from behind ear and into shoulder.  History of steroid injections in past left shoulder. She is concerned she may need another one. Hypoglycemic episodes:no Polydipsia/polyuria: no Visual disturbance: no Chest pain: no Paresthesias: no Glucose Monitoring: yes             Accucheck frequency: Daily             Fasting glucose: 70 to 110             Post prandial:             Evening:             Before meals: Taking Insulin ?: no             Long acting insulin :             Short acting insulin : Blood Pressure Monitoring: a few times a week Retinal Examination: Up To Date -- [facility] this year Foot Exam: Up to Date  Pneumovax: Up To Date  Influenza: Up To Date Aspirin : no    HYPERTENSION / HYPERLIPIDEMIA/A-FIB/HF Takes Olmesartan , Metoprolol , Lasix , Spironolactone , Amlodipine , Atorvastatin .  Continues to have occasional shortness of breath with activity. Last saw cardiology on 02/10/24, they increased  Metoprolol  to 100 MG, since that time she has been feeling more tired. CPAP used 100%. Takes Eliquis  for a-fib. Echo on 03/15/23 noted LVEF >70% and moderate pericardial effusion. Satisfied with current treatment? yes Duration of hypertension: chronic BP monitoring frequency: three times a week BP range: <130/80 BP medication side effects: no Duration of hyperlipidemia: chronic Cholesterol medication side effects: no Cholesterol supplements: none Medication compliance: good compliance Aspirin : no Recent stressors: no Recurrent headaches: no Visual changes: no Palpitations: occasional flutter Dyspnea: with activity at baseline Chest pain: no Lower extremity edema: no Dizzy/lightheaded: occasional with her sinuses  CHRONIC KIDNEY DISEASE (CKD 4) Saw kidney provider last on 01/18/24. CKD status: stable Medications renally dose: yes Previous renal evaluation: no Pneumovax:  Up To Date Influenza Vaccine:  Not up to Date --refuses    Latest Ref Rng & Units 04/23/2024    9:50 AM 05/02/2023   10:25 AM 04/13/2023   10:23 AM  BMP  Glucose 70 - 99 mg/dL 92  890  899   BUN 8 - 27 mg/dL 23  18  12   Creatinine 0.57 - 1.00 mg/dL 8.43  8.68  8.78   BUN/Creat Ratio 12 - 28 15  14  10    Sodium 134 - 144 mmol/L 142  145  147   Potassium 3.5 - 5.2 mmol/L 5.0  3.7  3.4   Chloride 96 - 106 mmol/L 103  102  102   CO2 20 - 29 mmol/L 26  26  27    Calcium  8.7 - 10.3 mg/dL 9.6  9.5  9.8     DEPRESSION Taking Duloxetine  and Buspar . Mood status: stable Satisfied with current treatment?: yes Symptom severity: moderate  Duration of current treatment : chronic Side effects: no Medication compliance: good compliance Psychotherapy/counseling: none Depressed mood: occasional Anxious mood: occasional Anhedonia: no Significant weight loss or gain: no Insomnia: occasional Fatigue:occasional Feelings of worthlessness or guilt: no Impaired concentration/indecisiveness: no Suicidal ideations:  no Hopelessness: no Crying spells: no    04/23/2024    9:42 AM 02/07/2024    1:59 PM 01/19/2024   10:27 AM 10/19/2023    9:55 AM 07/15/2023   10:44 AM  Depression screen PHQ 2/9  Decreased Interest 0 0 0 1 0  Down, Depressed, Hopeless 0 1 1 1  0  PHQ - 2 Score 0 1 1 2  0  Altered sleeping 1 1 1 1 1   Tired, decreased energy 1 1 1 1 1   Change in appetite 0 0 0 0 0  Feeling bad or failure about yourself  0 0 0 0 0  Trouble concentrating 0 1 0 0 0  Moving slowly or fidgety/restless 0 0 0 0 0  Suicidal thoughts 0 0 0 0 0  PHQ-9 Score 2 4  3  4  2    Difficult doing work/chores Not difficult at all Not difficult at all Not difficult at all Not difficult at all Not difficult at all     Data saved with a previous flowsheet row definition       04/23/2024    9:42 AM 01/19/2024   10:27 AM 10/19/2023    9:56 AM 07/15/2023   10:44 AM  GAD 7 : Generalized Anxiety Score  Nervous, Anxious, on Edge 1 1 1 1   Control/stop worrying 0 0 0 0  Worry too much - different things 0 0 1 1  Trouble relaxing 0 0 0 0  Restless 0 0 0 0  Easily annoyed or irritable 0 0 1 1  Afraid - awful might happen 0 0 0 0  Total GAD 7 Score 1 1 3 3   Anxiety Difficulty  Not difficult at all Not difficult at all Not difficult at all   Relevant past medical, surgical, family and social history reviewed and updated as indicated. Interim medical history since our last visit reviewed. Allergies and medications reviewed and updated.  Review of Systems  Constitutional:  Positive for fatigue. Negative for activity change, appetite change, diaphoresis and fever.  HENT:  Negative for ear discharge and ear pain (discomfort behind ear reported).   Respiratory:  Positive for shortness of breath (occasional). Negative for cough, chest tightness and wheezing.   Cardiovascular:  Negative for chest pain, palpitations and leg swelling.  Gastrointestinal: Negative.   Endocrine: Negative for cold intolerance, heat intolerance, polydipsia,  polyphagia and polyuria.  Musculoskeletal:  Positive for arthralgias.  Neurological:  Positive for dizziness (occasional). Negative for syncope, weakness, light-headedness and headaches.  Psychiatric/Behavioral: Negative.     Per HPI unless specifically indicated above     Objective:  BP 130/74 (BP Location: Left Arm, Patient Position: Sitting, Cuff Size: Large)   Pulse 62   Temp 97.9 F (36.6 C) (Oral)   Resp 15   Ht 5' 0.98 (1.549 m)   Wt 205 lb 3.2 oz (93.1 kg)   SpO2 100%   BMI 38.79 kg/m   Wt Readings from Last 3 Encounters:  04/23/24 205 lb 3.2 oz (93.1 kg)  04/15/24 202 lb (91.6 kg)  02/07/24 202 lb (91.6 kg)    Physical Exam Vitals and nursing note reviewed.  Constitutional:      General: She is awake. She is not in acute distress.    Appearance: Normal appearance. She is well-developed and well-groomed. She is obese. She is not ill-appearing or toxic-appearing.  HENT:     Head: Normocephalic.     Right Ear: Hearing and external ear normal.     Left Ear: Hearing and external ear normal.  Eyes:     General: Lids are normal.        Right eye: No discharge.        Left eye: No discharge.     Conjunctiva/sclera: Conjunctivae normal.     Pupils: Pupils are equal, round, and reactive to light.  Neck:     Thyroid : No thyromegaly.     Vascular: No carotid bruit.  Cardiovascular:     Rate and Rhythm: Normal rate and regular rhythm.     Heart sounds: Murmur heard.     Systolic murmur is present with a grade of 2/6.     No gallop.     Comments: Bilateral legs and ankles with no edema and good hair pattern present. Pulmonary:     Effort: Pulmonary effort is normal. No accessory muscle usage or respiratory distress.     Breath sounds: Normal breath sounds.  Abdominal:     General: Bowel sounds are normal. There is no distension.     Palpations: Abdomen is soft.     Tenderness: There is no abdominal tenderness.  Musculoskeletal:     Cervical back: Normal range of  motion and neck supple.     Right lower leg: No edema.     Left lower leg: No edema.  Lymphadenopathy:     Cervical: No cervical adenopathy.  Skin:    General: Skin is warm and dry.  Neurological:     Mental Status: She is alert and oriented to person, place, and time.     Deep Tendon Reflexes: Reflexes are normal and symmetric.     Reflex Scores:      Brachioradialis reflexes are 2+ on the right side and 2+ on the left side.      Patellar reflexes are 2+ on the right side and 2+ on the left side. Psychiatric:        Attention and Perception: Attention normal.        Mood and Affect: Mood normal.        Speech: Speech normal.        Behavior: Behavior normal. Behavior is cooperative.        Thought Content: Thought content normal.    Results for orders placed or performed in visit on 04/23/24  Bayer DCA Hb A1c Waived   Collection Time: 04/23/24  9:50 AM  Result Value Ref Range   HB A1C (BAYER DCA - WAIVED) 5.6 4.8 - 5.6 %  Basic metabolic panel with GFR   Collection Time: 04/23/24  9:50 AM  Result Value Ref Range   Glucose  92 70 - 99 mg/dL   BUN 23 8 - 27 mg/dL   Creatinine, Ser 8.43 (H) 0.57 - 1.00 mg/dL   eGFR 35 (L) >40 fO/fpw/8.26   BUN/Creatinine Ratio 15 12 - 28   Sodium 142 134 - 144 mmol/L   Potassium 5.0 3.5 - 5.2 mmol/L   Chloride 103 96 - 106 mmol/L   CO2 26 20 - 29 mmol/L   Calcium  9.6 8.7 - 10.3 mg/dL  Lipid Panel w/o Chol/HDL Ratio   Collection Time: 04/23/24  9:50 AM  Result Value Ref Range   Cholesterol, Total 127 100 - 199 mg/dL   Triglycerides 64 0 - 149 mg/dL   HDL 47 >60 mg/dL   VLDL Cholesterol Cal 13 5 - 40 mg/dL   LDL Chol Calc (NIH) 67 0 - 99 mg/dL      Assessment & Plan:   Problem List Items Addressed This Visit       Cardiovascular and Mediastinum   Paroxysmal atrial fibrillation (HCC)   Chronic, ongoing, followed by cardiology.  Continue Eliquis  and Metoprolol  + collaboration with cardiology team.  Rate controlled.  Continue to  collaborate with [facility].  Recent notes reviewed.        Relevant Medications   atorvastatin  (LIPITOR) 80 MG tablet   amLODipine  (NORVASC ) 2.5 MG tablet   furosemide  (LASIX ) 80 MG tablet   spironolactone  (ALDACTONE ) 25 MG tablet   Hypertension associated with diabetes (HCC)   Chronic, ongoing with BP at goal for age today.  At this time will continue all current medications and adjust as needed.  Continue collaboration with cardiology and nephrology, recent notes and labs reviewed.  Recommend she monitor BP at least a few mornings a week at home and document.  DASH diet at home.  Labs: BMP and A1c.       Relevant Medications   atorvastatin  (LIPITOR) 80 MG tablet   amLODipine  (NORVASC ) 2.5 MG tablet   furosemide  (LASIX ) 80 MG tablet   spironolactone  (ALDACTONE ) 25 MG tablet   Other Relevant Orders   Bayer DCA Hb A1c Waived (Completed)   Basic metabolic panel with GFR (Completed)     Endocrine   Type 2 diabetes mellitus with morbid obesity (HCC) - Primary   Chronic, ongoing with A1c 5.6% today and urine ALB 150 February 2025.  Praised for ongoing success.  Discussed that she is in normal A1c range which helps to reduce long term poor outcomes. - Continue Jardiance  25 MG and Ozempic  0.5 MG weekly at this time, as seeing significant benefit with this.  Gets via assistance. Determine next steps for new year as will not have Ozempic  assistance. Has enough supply to last until May 2026. - Maintain off Glimepiride due to her age and risk for hypoglycemia. Plus maintain off Metformin  due to kidney health. - If poor control once at MAX doses of GLP1 and SGLT2, then will consider referral to endo, but at this time will continue to work on this in office which patient agrees with. - Check BS TID and focus on diabetic diet.    - Eye and foot exams up to date. - Vaccines up to date. - Return in 3 months for A1c -- but alert provider prior to if elevations in BS >300 or <70.        Relevant  Medications   atorvastatin  (LIPITOR) 80 MG tablet   Other Relevant Orders   Bayer DCA Hb A1c Waived (Completed)   Hyperlipidemia associated with type 2 diabetes  mellitus (HCC)   Chronic, ongoing.  Continue current medication regimen and adjust as needed.  Lipid panel obtained today.       Relevant Medications   atorvastatin  (LIPITOR) 80 MG tablet   amLODipine  (NORVASC ) 2.5 MG tablet   furosemide  (LASIX ) 80 MG tablet   spironolactone  (ALDACTONE ) 25 MG tablet   Other Relevant Orders   Bayer DCA Hb A1c Waived (Completed)   Lipid Panel w/o Chol/HDL Ratio (Completed)   Diabetes mellitus treated with injections of non-insulin  medication (HCC)   Refer to diabetes with obesity plan of care.      Relevant Medications   atorvastatin  (LIPITOR) 80 MG tablet   Other Relevant Orders   Bayer DCA Hb A1c Waived (Completed)   CKD stage 4 due to type 2 diabetes mellitus (HCC)   Chronic, ongoing CKD 3b.  At this time reduce and renal dose medications as needed + continue collaboration with nephrology.  Labs: A1c and BMP.  Avoid NSAIDs.  Refer to diabetes with obesity plan of care for further.  A1c stable at 5.6% today.      Relevant Medications   atorvastatin  (LIPITOR) 80 MG tablet   Other Relevant Orders   Bayer DCA Hb A1c Waived (Completed)   Basic metabolic panel with GFR (Completed)     Genitourinary   Anemia in chronic kidney disease (CKD)   Chronic, ongoing.  Follows with nephrology.  Continue this collaboration, recent notes and labs reviewed. Labs up to date with them.        Other   Morbid obesity (HCC)   BMI 38.79 with T2DM, HTN, CKD - continues to maintain weight loss with Ozempic .  Recommended eating smaller high protein, low fat meals more frequently and exercising 30 mins a day 5 times a week with a goal of 10-15lb weight loss in the next 3 months. Patient voiced their understanding and motivation to adhere to these recommendations.       Depression, major, single episode,  moderate (HCC)   Chronic, stable.   Denies SI/HI.  Continue Duloxetine  at this time, which also offers benefit to chronic pain and mood, + continue Buspar  which has offered her benefit to anxiety.  Refills as needed.  Will adjust dosing or add medication as needed.        Relevant Medications   busPIRone  (BUSPAR ) 5 MG tablet   DULoxetine  (CYMBALTA ) 60 MG capsule   Chronic pain syndrome   To back, neck, and shoulders. Will place referral to ortho as would like to have left shoulder looked at, she is concerned that she may need another steroid injection which helped pain in the past. Will provide short burst of Prednisone  and to monitor sugars with this, unsure if pain coming from recent ear infection vs arthritis pain from neck/shoulder area. If any side effects present with steroid to immediately stop taking and alert PCP.      Relevant Medications   predniSONE  (DELTASONE ) 20 MG tablet   DULoxetine  (CYMBALTA ) 60 MG capsule   Other Relevant Orders   Ambulatory referral to Orthopedic Surgery   Other Visit Diagnoses       Flu vaccine need       High dose flu vaccine today, Educated patient.   Relevant Orders   Flu vaccine HIGH DOSE PF(Fluzone Trivalent) (Completed)     Need for COVID-19 vaccine       Covid vaccine today per request.   Relevant Orders   Pfizer Comirnaty Covid-19 Vaccine 85yrs & older (Completed)  Follow up plan: Return in about 4 weeks (around 05/21/2024) for NECK PAIN -- Plus 3 months for , T2DM, HTN/HLD.

## 2024-04-23 NOTE — Assessment & Plan Note (Signed)
 Chronic, ongoing with BP at goal for age today.  At this time will continue all current medications and adjust as needed.  Continue collaboration with cardiology and nephrology, recent notes and labs reviewed.  Recommend she monitor BP at least a few mornings a week at home and document.  DASH diet at home.  Labs: BMP and A1c.

## 2024-04-23 NOTE — Telephone Encounter (Unsigned)
 Copied from CRM #[id number]. Topic: Clinical - Prescription Issue >> Apr 23, 2024  4:26 PM Fonda T wrote: Reason for CRM: Pt calling, states she seen provider today and was prescribed several medications, one of which she is allergic to.  Medication allergic to, Prednisone .  Would like to speak to office to advise further, and what she can take in replace of it.  Called and spoke to office, Lesli, states gave message to clinical staff for a return call.   CB# [phone].

## 2024-04-23 NOTE — Assessment & Plan Note (Signed)
 Chronic, ongoing.  Continue current medication regimen and adjust as needed.  Lipid panel obtained today.

## 2024-04-23 NOTE — Assessment & Plan Note (Signed)
 Refer to diabetes with obesity plan of care.

## 2024-04-24 ENCOUNTER — Ambulatory Visit: Payer: Self-pay | Admitting: Nurse Practitioner

## 2024-04-24 ENCOUNTER — Other Ambulatory Visit: Payer: Self-pay

## 2024-04-24 LAB — BASIC METABOLIC PANEL WITH GFR
BUN/Creatinine Ratio: 15 (ref 12–28)
BUN: 23 mg/dL (ref 8–27)
CO2: 26 mmol/L (ref 20–29)
Calcium: 9.6 mg/dL (ref 8.7–10.3)
Chloride: 103 mmol/L (ref 96–106)
Creatinine, Ser: 1.56 mg/dL — ABNORMAL HIGH (ref 0.57–1.00)
Glucose: 92 mg/dL (ref 70–99)
Potassium: 5 mmol/L (ref 3.5–5.2)
Sodium: 142 mmol/L (ref 134–144)
eGFR: 35 mL/min/1.73 — ABNORMAL LOW (ref >59)

## 2024-04-24 LAB — LIPID PANEL W/O CHOL/HDL RATIO
Cholesterol, Total: 127 mg/dL (ref 100–199)
HDL: 47 mg/dL (ref >39)
LDL Chol Calc (NIH): 67 mg/dL (ref 0–99)
Triglycerides: 64 mg/dL (ref 0–149)
VLDL Cholesterol Cal: 13 mg/dL (ref 5–40)

## 2024-04-24 NOTE — Telephone Encounter (Signed)
 Routing to CMA who spoke with the patient for documentation purposes.

## 2024-04-24 NOTE — Telephone Encounter (Signed)
 Spoke with patient. Confirmed there was confusion based on paperwork listing her allergies she received from her pharmacy. She is in fact not allergic to Prednisone  and is safe to take.

## 2024-04-24 NOTE — Progress Notes (Signed)
 Good afternoon, please let Panayiota know her labs have returned: - Kidney function continues to show Stage 3b kidney disease. Ensure you continue to keep follow-up visits with kidney doctor. Good hydration at home and avoid Ibuprofen products. - Lipid panel levels are stable. Great news!!  Any questions? Keep being amazing!!  Thank you for allowing me to participate in your care.  I appreciate you. Kindest regards, Yashika Mask

## 2024-04-24 NOTE — Progress Notes (Signed)
 04/24/2024 Name: Cindy [name] MRN: [id number] DOB: 08/25/1950  Chief Complaint  Patient presents with   Medication Assistance   Cindy [name] is [name] 73 y.o. year old female who presented for [name] telephone follow-up visit to discuss medication access/affordability   Subjective:  Care Team: Primary Care Provider: Cannady, Jolene T, NP   Medication Access/Adherence  Current Pharmacy:  [facility] [phone] [place], [place] - [address] AT [facility] [address] [facility] [place] [address] Phone: [phone] Fax: [phone]  Cindy [name] - [place], [place] - [address] [place] [address] Phone: [phone] Fax: [phone]  Patient reports affordability concerns with their medications: Yes  Patient reports access/transportation concerns to their pharmacy: No  Patient reports adherence concerns with their medications:  Yes    Diabetes: Current medications: Jardiance  25mg  daily, Ozempic  0.5mg  weekly -Diabetes well controlled on current regimen -Patient currently receives Ozempic  through [facility] PAP, but will not be able to receive this through the program after the 1st of 2026.  She currently has enough medication on hand to get till approximately May. -Receives Jardiance  through [facility] Cares PAP and just received [name] 90 day supply of medication about 2 weeks ago.  Enrollment for this program ends 05/30/2024, but she should be eligible for re-enrollment for 2026  Macrovascular and Microvascular Risk Reduction:  Statin? yes (atorvastatin  80mg  daily); ACEi/ARB? yes (olmesartan  40mg  daily) Last urinary albumin/creatinine ratio:  Lab Results  Component Value Date   MICRALBCREAT 30-300 (H) 07/15/2023   MICRALBCREAT >300 (H) 06/25/2022   MICRALBCREAT 30-300 (H) 06/23/2021   Last eye exam:   Last foot exam: No foot exam found Tobacco Use:  Tobacco Use: High Risk (04/23/2024)   Patient History    Smoking Tobacco Use: Never     Smokeless Tobacco Use: Current    Passive Exposure: Not on file   Objective:  Lab Results  Component Value Date   HGBA1C 5.6 04/23/2024   Lab Results  Component Value Date   CREATININE 1.56 (H) 04/23/2024   BUN 23 04/23/2024   NA 142 04/23/2024   K 5.0 04/23/2024   CL 103 04/23/2024   CO2 26 04/23/2024   Lab Results  Component Value Date   CHOL 127 04/23/2024   HDL 47 04/23/2024   LDLCALC 67 04/23/2024   TRIG 64 04/23/2024   Medications Reviewed Today     Reviewed by [name], RPH (Pharmacist) on 04/24/24 at 1247  Med List Status: <None>   Medication Order Taking? Sig Documenting Provider Last Dose Status Informant  acetaminophen  (TYLENOL ) 650 MG CR tablet [id number]  Take 650 mg by mouth every 8 (eight) hours as needed for pain. [provider]  Active Multiple Informants  amLODipine  (NORVASC ) 2.5 MG tablet [id number]  Take 1 tablet (2.5 mg total) by mouth daily. Cannady, Jolene T, NP  Active   apixaban  (ELIQUIS ) 5 MG TABS tablet [id number] Yes Take 1 tablet (5 mg total) by mouth 2 (two) times daily. Please place on hold until patient requests refill. Cannady, Jolene T, NP  Active   atorvastatin  (LIPITOR) 80 MG tablet [id number]  Take 1 tablet (80 mg total) by mouth daily. Cannady, Jolene T, NP  Active   azelastine  (ASTELIN ) 0.1 % nasal spray [id number]  SPRAY ONCE IN EACH NOSTRIL TWICE DAILY AS DIRECTED Cannady, Jolene T, NP  Active   busPIRone  (BUSPAR ) 5 MG tablet [id number]  Take 1 tablet (5 mg total) by mouth  2 (two) times daily as needed. Cannady, Jolene T, NP  Active   calcitRIOL (ROCALTROL) 0.25 MCG capsule [id number]  Take 0.25 mcg by mouth daily. [provider]  Active   cetirizine (ZYRTEC) 10 MG tablet [id number]  Take 10 mg by mouth daily. [provider]  Active Multiple Informants  diclofenac Sodium (VOLTAREN ARTHRITIS PAIN) 1 % GEL [id number]  Apply 2 g topically 4 (four) times daily as needed. [provider]  Active Self   DULoxetine  (CYMBALTA ) 60 MG capsule [id number]  Take 1 capsule (60 mg total) by mouth daily. Cannady, Jolene T, NP  Active   empagliflozin  (JARDIANCE ) 25 MG TABS tablet [id number] Yes Take 1 tablet (25 mg total) by mouth daily before breakfast. Cannady, Jolene T, NP  Active            Med Note Cindy [name]   Tue Mar 01, 2023 11:53 AM) PAP  ferrous sulfate  (FEROSUL) 325 (65 FE) MG tablet [id number]  Take 1 tablet (325 mg total) by mouth daily. Cannady, Jolene T, NP  Active   fluticasone  (FLONASE ) 50 MCG/ACT nasal spray [id number]  SHAKE LIQUID AND USE 2 SPRAYS IN EACH NOSTRIL DAILY Cannady, Jolene T, NP  Active   furosemide  (LASIX ) 80 MG tablet [id number]  TAKE 1 TABLET BY MOUTH TWICE DAILY FOR FLUID RETENTION Cannady, Jolene T, NP  Active   glucose blood test strip [id number]  To check blood sugar twice [name] day with goal fasting in morning <130 and goal 2 hours after meal <180.  Write all checks down for provider visits. Cannady, Jolene T, NP  Active   Metamucil Fiber CHEW [id number]  Chew 1 tablet by mouth daily. [provider]  Active   metoprolol  succinate (TOPROL -XL) 100 MG 24 hr tablet [id number]  Take 100 mg by mouth daily. [provider]  Active   minoxidil  (LONITEN ) 10 MG tablet [id number]  TAKE 1 TABLET BY MOUTH DAILY FOR BLOOD PRESSURE Cannady, Jolene T, NP  Active   montelukast  (SINGULAIR ) 10 MG tablet [id number]  Take 1 tablet (10 mg total) by mouth at bedtime. Valerio Moris T, NP  Active   mupirocin  ointment (BACTROBAN ) 2 % [id number]  Apply 1 Application topically 2 (two) times daily. Cannady, Jolene T, NP  Active            Med Note Cindy [name]   Tue Mar 01, 2023 12:00 PM) As needed  olmesartan  (BENICAR ) 40 MG tablet [id number]  Take 1 tablet (40 mg total) by mouth daily. Cannady, Jolene T, NP  Active   omeprazole (PRILOSEC) 20 MG capsule [id number]  Take 20 mg by mouth daily as needed (heartburn).  [provider]  Active Multiple Informants  predniSONE   (DELTASONE ) 20 MG tablet [id number]  Take 2 tablets (40 mg total) by mouth daily with breakfast for 5 days. Cannady, Jolene T, NP  Active   Semaglutide ,0.25 or 0.5MG /DOS, (OZEMPIC , 0.25 OR 0.5 MG/DOSE,) 2 MG/1.5ML SOPN [id number] Yes Inject 0.5 mg into the skin once [name] week. Valerio Moris T, NP  Active            Med Note ZENA, Beulah Capobianco [name]   Tue Feb 22, 2023 11:27 AM) PAP  sennosides-docusate sodium  (SENOKOT-S) 8.6-50 MG tablet [id number]  Take 2 tablets by mouth daily. [provider]  Active   spironolactone  (ALDACTONE ) 25 MG tablet [id number]  TAKE 2 TABLETS(50 MG) BY MOUTH DAILY Cannady, Jolene T, NP  Active   tiZANidine  (ZANAFLEX ) 2 MG tablet [id number]  Take  1 tablet (2 mg total) by mouth every 6 (six) hours as needed for muscle spasms. Valerio Moris T, NP  Active            Assessment/Plan:   Diabetes: -Currently controlled; goal A1c <7%. Cardiorenal risk reduction is opportunities for improvement.. Blood pressure is at goal <130/80. LDL is at goal.  -Continue current regimen at this time -Will work with medication assistance team to start 2026 re-enrollment application for [facility], so patient can continue to receive Jardiance  at no cost -Patient was denied for [facility] Medicare Extra Help in 2024 due to her husband's income; she states this has not changed for 2025; so it is unlikely she would qualify for this program to assist with Ozempic  -Patient prefers to stay with her current Medicare plan for 2026 -Advised her I would contact insurance after the 1st of the year to check on the coverage and anticipated cost for Ozempic .  If not covered or affordable, we could consider the following alternatives:  liraglutide (patient does not prefer daily injection, but only GLP1 with [name] generic), Januvia (may qualify for PAP)  Follow Up Plan: 1/15  Channing DELENA Mealing, PharmD, DPLA

## 2024-04-25 NOTE — Telephone Encounter (Signed)
 Spoke with pt today, pt has not received pap [facility] Jardiance , if she does not received it she will call back.

## 2024-05-01 NOTE — Telephone Encounter (Signed)
 Received provider portion [facility] Jardiance , waiting on pt portion.

## 2024-05-02 DIAGNOSIS — M25512 Pain in left shoulder: Secondary | ICD-10-CM | POA: Diagnosis not present

## 2024-05-02 DIAGNOSIS — M542 Cervicalgia: Secondary | ICD-10-CM | POA: Diagnosis not present

## 2024-05-05 ENCOUNTER — Other Ambulatory Visit: Payer: Self-pay | Admitting: Nurse Practitioner

## 2024-05-10 NOTE — Telephone Encounter (Signed)
 Received pt portion pap [facility] (Jardiance )pt did not send proof of income, pt said to ask [facility] Channing she help her last year and might have her proof of income from last year if not we need to wait until she have her taxes done at beginning of year.(Pt may need samples) until we submit her PAP.

## 2024-05-11 ENCOUNTER — Telehealth: Payer: Self-pay

## 2024-05-11 NOTE — Progress Notes (Signed)
° °  05/11/2024  Patient ID: Cindy [name], female   DOB: 1951-04-11, 73 y.o.   MRN: [id number]  Patient outreach to follow-up on Jardiance  PAP application.  Patient did not bring in proof of income, because she has not yet filed taxes for 2025 or received her social security benefit statement for 2026.  Advised that she look for her 2024 tax return for us  to submit to see if they will accept this.  She is going to look for this and will bring it in to [facility] if able to find.  Channing DELENA Mealing, PharmD, DPLA

## 2024-05-17 ENCOUNTER — Other Ambulatory Visit (HOSPITAL_COMMUNITY): Payer: Self-pay

## 2024-05-17 NOTE — Telephone Encounter (Signed)
 Received pt proof of income ,submitted to [facility] along pt portion and provider ,Ins card.

## 2024-05-31 NOTE — Patient Instructions (Signed)
Be Involved in Caring For Your Health:  Taking Medications When medications are taken as directed, they can greatly improve your health. But if they are not taken as prescribed, they may not work. In some cases, not taking them correctly can be harmful. To help ensure your treatment remains effective and safe, understand your medications and how to take them. Bring your medications to each visit for review by your provider.  Your lab results, notes, and after visit summary will be available on My Chart. We strongly encourage you to use this feature. If lab results are abnormal the clinic will contact you with the appropriate steps. If the clinic does not contact you assume the results are satisfactory. You can always view your results on My Chart. If you have questions regarding your health or results, please contact the clinic during office hours. You can also ask questions on My Chart.  We at [facility] are grateful that you chose [place] to provide your care. We strive to provide evidence-based and compassionate care and are always looking for feedback. If you get a survey from the clinic please complete this so we can hear your opinions.  Cervical Radiculopathy  Cervical radiculopathy happens when a nerve in the neck (a cervical nerve) is pinched or bruised. This condition can happen because of an injury to the cervical spine (vertebrae) in the neck, or as part of the normal aging process. Pressure on the cervical nerves can cause pain or numbness that travels from the neck all the way down to the arm and fingers. This condition usually gets better with rest. Treatment may be needed if the condition does not improve. What are the causes? This condition may be caused by: A neck injury. A bulging (herniated) disk. Muscle spasms. Muscle tightness in the neck due to overuse. Arthritis. Breakdown or degeneration in the bones and joints of the spine (spondylosis) due to aging. Bone spurs  that may develop near the cervical nerves. What are the signs or symptoms? Symptoms of this condition include: Pain. The pain may travel from the neck to the arm and hand. The pain can be severe or irritating. It may get worse when you move your neck. Numbness or tingling in your arm or hand. Weakness in the affected arm and hand, in severe cases. How is this diagnosed? This condition may be diagnosed based on your symptoms, your medical history, and a physical exam. You may also have tests, including: X-rays. CT scan. MRI. Electromyogram (EMG). Nerve conduction tests. How is this treated? In many cases, treatment is not needed for this condition. With rest, the condition usually gets better over time. If treatment is needed, options may include: Wearing a soft neck collar (cervical collar) for short periods of time. Doing physical therapy to strengthen your neck muscles. Taking medicines. These may include NSAIDs, such as ibuprofen, or oral corticosteroids. Having spinal injections, in severe cases. Having surgery. This may be needed if other treatments do not help. Different types of surgery may be done depending on the cause of this condition. Follow these instructions at home: If you have a cervical collar: Wear it as told by your health care provider. Remove it only as told by your health care provider. Ask your health care provider if you can remove the cervical collar for cleaning and bathing. If you are allowed to remove the collar for cleaning or bathing: Follow instructions from your health care provider about how to remove the collar safely. Clean the  collar by wiping it with mild soap and water and drying it completely. Take out any removable pads in the collar every 1-2 days, and wash them by hand with soap and water. Let them air-dry completely before you put them back in the collar. Check your skin under the collar for irritation or sores. If you see any, tell your health  care provider. Managing pain     Take over-the-counter and prescription medicines only as told by your health care provider. If directed, put ice on the affected area. To do this: If you have a soft neck collar, remove it as told by your health care provider. Put ice in a plastic bag. Place a towel between your skin and the bag. Leave the ice on for 20 minutes, 2-3 times a day. Remove the ice if your skin turns bright red. This is very important. If you cannot feel pain, heat, or cold, you have a greater risk of damage to the area. If applying ice does not help, you can try using heat. Use the heat source that your health care provider recommends, such as a moist heat pack or a heating pad. Place a towel between your skin and the heat source. Leave the heat on for 20-30 minutes. Remove the heat if your skin turns bright red. This is especially important if you are unable to feel pain, heat, or cold. You have a greater risk of getting burned. Try a gentle neck and shoulder massage to help relieve symptoms. Activity Rest as needed. Return to your normal activities as told by your health care provider. Ask your health care provider what activities are safe for you. Do stretching and strengthening exercises as told by your health care provider or your physical therapist. You may have to avoid lifting. Ask your health care provider how much you can safely lift. General instructions Use a flat pillow when you sleep. Do not drive while wearing a cervical collar. If you do not have a cervical collar, ask your health care provider if it is safe to drive while your neck heals. Ask your health care provider if the medicine prescribed to you requires you to avoid driving or using machinery. Do not use any products that contain nicotine or tobacco. These products include cigarettes, chewing tobacco, and vaping devices, such as e-cigarettes. If you need help quitting, ask your health care provider. Keep  all follow-up visits. This is important. Contact a health care provider if: Your condition does not improve with treatment. Get help right away if: Your pain gets much worse and is not controlled with medicines. You have weakness or numbness in your hand, arm, face, or leg. You have a high fever. You have a stiff, rigid neck. You lose control of your bowels or your bladder (have incontinence). You have trouble with walking, balance, or speaking. Summary Cervical radiculopathy happens when a nerve in the neck is pinched or bruised. A nerve can get pinched from a bulging disk, arthritis, muscle spasms, or an injury to the neck. Symptoms include pain, tingling, or numbness radiating from the neck to the arm or hand. Weakness can also occur in severe cases. Treatment may include rest, wearing a cervical collar, and physical therapy. Medicines may be prescribed to help with pain. In severe cases, injections or surgery may be needed. This information is not intended to replace advice given to you by your health care provider. Make sure you discuss any questions you have with your health care provider. Document  Revised: 11/20/2020 Document Reviewed: 11/20/2020 Elsevier Patient Education  2024 [employer].

## 2024-06-04 ENCOUNTER — Encounter: Payer: Self-pay | Admitting: Nurse Practitioner

## 2024-06-04 ENCOUNTER — Ambulatory Visit: Admitting: Nurse Practitioner

## 2024-06-04 VITALS — BP 130/68 | HR 68 | Temp 98.1°F | Resp 17 | Ht 60.98 in | Wt 200.8 lb

## 2024-06-04 DIAGNOSIS — G894 Chronic pain syndrome: Secondary | ICD-10-CM | POA: Diagnosis not present

## 2024-06-04 DIAGNOSIS — I480 Paroxysmal atrial fibrillation: Secondary | ICD-10-CM

## 2024-06-04 MED ORDER — OZEMPIC (0.25 OR 0.5 MG/DOSE) 2 MG/1.5ML ~~LOC~~ SOPN: 0.5000 mg | PEN_INJECTOR | SUBCUTANEOUS | 4 refills | Status: AC

## 2024-06-04 NOTE — Assessment & Plan Note (Signed)
 Chronic, ongoing, followed by cardiology.  Continue Eliquis  and Metoprolol  + collaboration with cardiology team.  Rate controlled.  Continue to collaborate with [facility] Pharm[facility].  Recent notes reviewed.  Referral to local cardiology placed.

## 2024-06-04 NOTE — Assessment & Plan Note (Signed)
 To back, neck, and shoulders. Improved at this time. Attended ortho and PT, recent notes reviewed.

## 2024-06-04 NOTE — Progress Notes (Signed)
 "  BP 130/68 (BP Location: Left Arm, Patient Position: Sitting, Cuff Size: Large)   Pulse 68   Temp 98.1 F (36.7 C) (Oral)   Resp 17   Ht 5' 0.98 (1.549 m)   Wt 200 lb 12.8 oz (91.1 kg)   SpO2 98%   BMI 37.96 kg/m    Subjective:    Patient ID: Cindy [name], female    DOB: 1950/09/03, 74 y.o.   MRN: [id number]  HPI: Cindy [name] is a 74 y.o. female  Chief Complaint  Patient presents with   Follow-up    Here for follow up appointment. No major concerns   NECK PAIN FOLLOW UP Follow-up today for chronic pain issues with neck and left shoulder. Provided a short burst of Prednisone  on 04/23/24. Saw ortho on 05/02/24, was given a shot in left shoulder and did PT time. She reports pain is doing pretty good. Better than she was.  Needs new heart doctor as [facility] no longer covered. Status: improved Treatments attempted: rest, heat, APAP, and physical therapy  Compliant with recommended treatment: yes Relief with NSAIDs?:  No NSAIDs Taken Location:Left Duration:chronic Severity: 0/10 Quality: dull, aching, and throbbing Frequency: intermittent Radiation: none Aggravating factors: lifting, movement, and bending Alleviating factors: rest, heat, APAP, and muscle relaxer Weakness:  no Paresthesias / decreased sensation:  no  Fevers:  no   Relevant past medical, surgical, family and social history reviewed and updated as indicated. Interim medical history since our last visit reviewed. Allergies and medications reviewed and updated.  Review of Systems  Constitutional:  Negative for activity change, appetite change, diaphoresis, fatigue and fever.  Respiratory:  Positive for shortness of breath (occasional). Negative for cough, chest tightness and wheezing.   Cardiovascular:  Negative for chest pain, palpitations and leg swelling.  Gastrointestinal: Negative.   Endocrine: Negative for cold intolerance, heat intolerance, polydipsia, polyphagia and polyuria.  Musculoskeletal:   Negative for neck pain.  Neurological: Negative.   Psychiatric/Behavioral: Negative.      Per HPI unless specifically indicated above     Objective:    BP 130/68 (BP Location: Left Arm, Patient Position: Sitting, Cuff Size: Large)   Pulse 68   Temp 98.1 F (36.7 C) (Oral)   Resp 17   Ht 5' 0.98 (1.549 m)   Wt 200 lb 12.8 oz (91.1 kg)   SpO2 98%   BMI 37.96 kg/m   Wt Readings from Last 3 Encounters:  06/04/24 200 lb 12.8 oz (91.1 kg)  04/23/24 205 lb 3.2 oz (93.1 kg)  04/15/24 202 lb (91.6 kg)    Physical Exam Vitals and nursing note reviewed.  Constitutional:      General: She is awake. She is not in acute distress.    Appearance: She is well-developed and well-groomed. She is obese. She is not ill-appearing or toxic-appearing.  HENT:     Head: Normocephalic.     Right Ear: Hearing and external ear normal.     Left Ear: Hearing and external ear normal.  Eyes:     General: Lids are normal.        Right eye: No discharge.        Left eye: No discharge.     Conjunctiva/sclera: Conjunctivae normal.     Pupils: Pupils are equal, round, and reactive to light.  Neck:     Thyroid : No thyromegaly.     Vascular: No carotid bruit.  Cardiovascular:     Rate and Rhythm: Normal rate and regular rhythm.  Heart sounds: Normal heart sounds. No murmur heard.    No gallop.  Pulmonary:     Effort: Pulmonary effort is normal. No accessory muscle usage or respiratory distress.     Breath sounds: Normal breath sounds. No decreased breath sounds, wheezing or rales.  Abdominal:     General: Bowel sounds are normal. There is no distension.     Palpations: Abdomen is soft.     Tenderness: There is no abdominal tenderness.  Musculoskeletal:     Cervical back: Normal range of motion and neck supple. No spinous process tenderness or muscular tenderness. Normal range of motion.     Right lower leg: No edema.     Left lower leg: No edema.  Lymphadenopathy:     Cervical: No cervical  adenopathy.  Skin:    General: Skin is warm and dry.  Neurological:     Mental Status: She is alert and oriented to person, place, and time.     Deep Tendon Reflexes: Reflexes are normal and symmetric.     Reflex Scores:      Brachioradialis reflexes are 2+ on the right side and 2+ on the left side.      Patellar reflexes are 2+ on the right side and 2+ on the left side. Psychiatric:        Attention and Perception: Attention normal.        Mood and Affect: Mood normal.        Speech: Speech normal.        Behavior: Behavior normal. Behavior is cooperative.        Thought Content: Thought content normal.     Results for orders placed or performed in visit on 04/23/24  Bayer DCA Hb A1c Waived   Collection Time: 04/23/24  9:50 AM  Result Value Ref Range   HB A1C (BAYER DCA - WAIVED) 5.6 4.8 - 5.6 %  Basic metabolic panel with GFR   Collection Time: 04/23/24  9:50 AM  Result Value Ref Range   Glucose 92 70 - 99 mg/dL   BUN 23 8 - 27 mg/dL   Creatinine, Ser 8.43 (H) 0.57 - 1.00 mg/dL   eGFR 35 (L) >40 fO/fpw/8.26   BUN/Creatinine Ratio 15 12 - 28   Sodium 142 134 - 144 mmol/L   Potassium 5.0 3.5 - 5.2 mmol/L   Chloride 103 96 - 106 mmol/L   CO2 26 20 - 29 mmol/L   Calcium  9.6 8.7 - 10.3 mg/dL  Lipid Panel w/o Chol/HDL Ratio   Collection Time: 04/23/24  9:50 AM  Result Value Ref Range   Cholesterol, Total 127 100 - 199 mg/dL   Triglycerides 64 0 - 149 mg/dL   HDL 47 >60 mg/dL   VLDL Cholesterol Cal 13 5 - 40 mg/dL   LDL Chol Calc (NIH) 67 0 - 99 mg/dL      Assessment & Plan:   Problem List Items Addressed This Visit       Cardiovascular and Mediastinum   Paroxysmal atrial fibrillation (HCC) - Primary   Chronic, ongoing, followed by cardiology.  Continue Eliquis  and Metoprolol  + collaboration with cardiology team.  Rate controlled.  Continue to collaborate with [facility] PharmD.  Recent notes reviewed.  Referral to local cardiology placed.      Relevant Orders    Ambulatory referral to Cardiology     Other   Chronic pain syndrome   To back, neck, and shoulders. Improved at this time. Attended ortho and PT, recent notes  reviewed.        Follow up plan: Return for as scheduled in May 2026.      "

## 2024-06-05 ENCOUNTER — Other Ambulatory Visit: Payer: Self-pay

## 2024-06-05 DIAGNOSIS — E1169 Type 2 diabetes mellitus with other specified complication: Secondary | ICD-10-CM

## 2024-06-05 DIAGNOSIS — Z7985 Long-term (current) use of injectable non-insulin antidiabetic drugs: Secondary | ICD-10-CM

## 2024-06-05 NOTE — Progress Notes (Signed)
" ° °  06/05/2024  Patient ID: Cindy [name], female   DOB: [birth year], 74 y.o.   MRN: [id number]  Patient outreach to follow-up on medication access/affordability.    I contacted the patient's insurance, and she has a $250 deductible on her plan.  Once this is met, her copays for Eliquis  5mg  BID and Ozempic  0.5mg  weekly would be $47/each monthly.    Patient has been approved for [facility] PAP for her Jardiance  25mg  and will continue to receive this at no cost throughout 2026.  She endorses ability to continue to pay for Eliquis  (would not qualify for PAP based on 3% of household income out of pocket spending requirement), but she would like to switch to Trulicity; so we can apply for [facility] PAP.  I would recommend changing from Ozempic  0.5mg  weekly to Trulicity 1.5mg  weekly.  She hs enough Ozempic  left to last approximately another 6 weeks.  Coordinating with PCP and medication assistance team.  Channing DELENA Mealing, PharmD, DPLA  "

## 2024-06-06 ENCOUNTER — Telehealth: Payer: Self-pay

## 2024-06-06 NOTE — Telephone Encounter (Signed)
 Mail out PAP Cindy [name] (Trulicity) to pt addres and faxed provider portion.

## 2024-06-12 ENCOUNTER — Ambulatory Visit
Admission: EM | Admit: 2024-06-12 | Discharge: 2024-06-12 | Disposition: A | Discharge status: Home / Self Care | Attending: Emergency Medicine | Admitting: Emergency Medicine

## 2024-06-12 ENCOUNTER — Ambulatory Visit: Payer: Self-pay

## 2024-06-12 ENCOUNTER — Ambulatory Visit

## 2024-06-12 DIAGNOSIS — K5900 Constipation, unspecified: Secondary | ICD-10-CM | POA: Diagnosis not present

## 2024-06-12 DIAGNOSIS — R1011 Right upper quadrant pain: Secondary | ICD-10-CM

## 2024-06-12 NOTE — ED Provider Notes (Signed)
 HPI  SUBJECTIVE:  Cindy [name] is a 74 y.o. female who presents with a hard ball in her right upper quadrant present for the past 1 to 2 months.  States that it became painful and larger over the weekend.  She states that it is intermittently painful, describing it as sharp, lasting 2 to 3 minutes.  She is not sure if it changes in size or has any overlying erythema.  No fevers, coughing, wheezing, shortness of breath, trauma to the area.  She reports increased belching, normal bowel movements.  No other abdominal pain.  She tried rest, being still and Tylenol .  Being still helps.  Symptoms are worse with walking and turning her torso to the left.  It does not seem to be particularly associated with eating.  She denies nausea, vomiting, abdominal distention.  She has a past medical history of diabetes, hypertension, GERD, paroxysmal atrial fibrillation on Eliquis ,.  diabetic nephropathy, kidney disease stage IV, recurrent boils, diaphragmatic hernia, coronary artery disease, hypercholesterolemia, BMI above 40.  No history of gallbladder disease, pancreatitis.  PCP: [facility]  Past Medical History:  Diagnosis Date   Chronic pain syndrome    Depression    Diabetes mellitus without complication (HCC)    GERD (gastroesophageal reflux disease)    History of hiatal hernia    Hypertension    Iron deficiency anemia    Microalbuminuric diabetic nephropathy (HCC)    Mitral regurgitation    Obesity    Shortness of breath dyspnea    Sleep apnea     Past Surgical History:  Procedure Laterality Date   ABDOMINAL HYSTERECTOMY     BACK SURGERY     BREAST BIOPSY Right 03/2003   CARDIAC CATHETERIZATION N/A 10/10/2014   Procedure: Left Heart Cath;  Surgeon: Marsa Dooms, MD;  Location: [facility] INVASIVE CV LAB;  Service: Cardiovascular;  Laterality: N/A;   CARDIAC CATHETERIZATION     carpal tunnell release  10/23/11   COLONOSCOPY WITH PROPOFOL  N/A 02/20/2015   Procedure: COLONOSCOPY  WITH PROPOFOL ;  Surgeon: Deward CINDERELLA Piedmont, MD;  Location: [facility] ENDOSCOPY;  Service: Gastroenterology;  Laterality: N/A;   COLONOSCOPY WITH PROPOFOL  N/A 04/26/2017   Procedure: COLONOSCOPY WITH PROPOFOL ;  Surgeon: Toledo, Ladell POUR, MD;  Location: [facility] ENDOSCOPY;  Service: Gastroenterology;  Laterality: N/A;   ESOPHAGOGASTRODUODENOSCOPY (EGD) WITH PROPOFOL  N/A 02/20/2015   Procedure: ESOPHAGOGASTRODUODENOSCOPY (EGD) WITH PROPOFOL ;  Surgeon: Deward CINDERELLA Piedmont, MD;  Location: [facility] ENDOSCOPY;  Service: Gastroenterology;  Laterality: N/A;   ESOPHAGOGASTRODUODENOSCOPY (EGD) WITH PROPOFOL  N/A 04/26/2017   Procedure: ESOPHAGOGASTRODUODENOSCOPY (EGD) WITH PROPOFOL ;  Surgeon: Toledo, Ladell POUR, MD;  Location: [facility] ENDOSCOPY;  Service: Gastroenterology;  Laterality: N/A;   HERNIA REPAIR     LAPAROSCOPIC OVARIAN CYSTECTOMY  08/2001   nissen fundoplycation  11/99   NOSE SURGERY      Family History  Problem Relation Age of Onset   Heart attack Mother    Hypertension Father    Stroke Father    Heart attack Brother     Social History[1]  Current Medications[2]  Allergies[3]   ROS  As noted in HPI.   Physical Exam  BP (!) 136/50 (BP Location: Left Arm)   Pulse 70   Temp 98.7 F (37.1 C) (Oral)   Resp 18   Wt 92 kg   SpO2 98%   BMI 38.34 kg/m   Constitutional: Well developed, well nourished, no acute distress Eyes:  EOMI, conjunctiva normal bilaterally HENT: Normocephalic, atraumatic,mucus membranes moist Respiratory: Normal inspiratory effort, lungs  clear bilaterally Cardiovascular: Normal rate, regular rhythm, positive for murmur. GI: Obese.  Right upper quadrant tenderness.  Negative Murphy.  No appreciable hepatomegaly or mass.  Questionable tenderness along lower ribs.  No guarding, rebound.  Active bowel sounds.  No other abdominal tenderness. skin: No rash, skin intact Musculoskeletal: no deformities Neurologic: Alert & oriented x 3, no focal neuro deficits Psychiatric: Speech and behavior  appropriate   ED Course   Medications - No data to display  Orders Placed This Encounter  Procedures   DG Abd Acute W/Chest    Standing Status:   Standing    Number of Occurrences:   1    Reason for Exam (SYMPTOM  OR DIAGNOSIS REQUIRED):   1 to 2 months of right upper quadrant mass, pain, rule out constipation, obstruction, free air.    No results found for this or any previous visit (from the past 24 hours). No results found.   ED Clinical Impression  1. Abdominal pain, right upper quadrant   2. Constipation, unspecified constipation type      ED Assessment/Plan    Outside records, labs reviewed.  Additional history obtained.  As noted in HPI. Calculated creatinine clearance based on labs done in November 2025 46 mL/min.  She saw her PCP on 1/5 but did not mention the symptoms at that visit..   Differential for right upper quadrant pain includes cholelithiasis, constipation, costochondritis, muscle spasm.  Low suspicion for PE due to already being anticoagulated, pneumonia.  This has been going on for 1 to 2 months, her abdomen is benign.  Doubt perforation, or acute obstruction.  Will get acute abdominal series which will evaluate both the chest and the right upper quadrant for pneumonia, perforation, obstruction, constipation.  She may benefit from a right upper quadrant ultrasound, but this is not available today at this hour.  Low suspicion for acute cholecystitis.  I believe this can be done as an outpatient with her PCP.   Reviewed imaging independently.  No obstruction, perforation as read by me.  Constipation.   See radiology report for full details.  Discussed with patient and family member while in department.  I am not entirely sure what is causing her pain, but we can try Tylenol , lidocaine  patches in case this is musculoskeletal.  Continue MiraLAX , push fluids in case this is constipation.  I sent her PCP a note notifying her visit and my findings, and she  recommends that the patient reach out to the office tomorrow.  Discussed this with patient and family member. Strict ER return precautions given.  Discussed imaging, MDM, treatment plan, and plan for follow-up with patient. Discussed sn/sx that should prompt return to the ED. patient agrees with plan.   No orders of the defined types were placed in this encounter.     *This clinic note was created using Dragon dictation software. Therefore, there may be occasional mistakes despite careful proofreading.  ?     [1]  Social History Tobacco Use   Smoking status: Never   Smokeless tobacco: Current    Types: Snuff, Chew  Vaping Use   Vaping status: Never Used  Substance Use Topics   Alcohol use: No   Drug use: No  [2] No current facility-administered medications for this encounter.  Current Outpatient Medications:    acetaminophen  (TYLENOL ) 650 MG CR tablet, Take 650 mg by mouth every 8 (eight) hours as needed for pain., Disp: , Rfl:    amLODipine  (NORVASC ) 2.5 MG tablet, Take 1  tablet (2.5 mg total) by mouth daily., Disp: 90 tablet, Rfl: 4   apixaban  (ELIQUIS ) 5 MG TABS tablet, Take 1 tablet (5 mg total) by mouth 2 (two) times daily. Please place on hold until patient requests refill., Disp: 60 tablet, Rfl: 11   atorvastatin  (LIPITOR) 80 MG tablet, Take 1 tablet (80 mg total) by mouth daily., Disp: 90 tablet, Rfl: 4   azelastine  (ASTELIN ) 0.1 % nasal spray, SPRAY ONCE IN EACH NOSTRIL TWICE DAILY AS DIRECTED, Disp: 30 mL, Rfl: 12   busPIRone  (BUSPAR ) 5 MG tablet, Take 1 tablet (5 mg total) by mouth 2 (two) times daily as needed., Disp: 180 tablet, Rfl: 4   calcitRIOL (ROCALTROL) 0.25 MCG capsule, Take 0.25 mcg by mouth daily., Disp: , Rfl:    cetirizine (ZYRTEC) 10 MG tablet, Take 10 mg by mouth daily., Disp: , Rfl:    diclofenac Sodium (VOLTAREN ARTHRITIS PAIN) 1 % GEL, Apply 2 g topically 4 (four) times daily as needed., Disp: , Rfl:    DULoxetine  (CYMBALTA ) 60 MG capsule, Take 1  capsule (60 mg total) by mouth daily., Disp: 90 capsule, Rfl: 4   empagliflozin  (JARDIANCE ) 25 MG TABS tablet, Take 1 tablet (25 mg total) by mouth daily before breakfast., Disp: 90 tablet, Rfl: 1   ferrous sulfate  (FEROSUL) 325 (65 FE) MG tablet, Take 1 tablet (325 mg total) by mouth daily., Disp: 90 tablet, Rfl: 2   fluticasone  (FLONASE ) 50 MCG/ACT nasal spray, SHAKE LIQUID AND USE 2 SPRAYS IN EACH NOSTRIL DAILY, Disp: 16 g, Rfl: 7   furosemide  (LASIX ) 80 MG tablet, TAKE 1 TABLET BY MOUTH TWICE DAILY FOR FLUID RETENTION, Disp: 180 tablet, Rfl: 4   glucose blood test strip, To check blood sugar twice a day with goal fasting in morning <130 and goal 2 hours after meal <180.  Write all checks down for provider visits., Disp: 100 each, Rfl: 3   Metamucil Fiber CHEW, Chew 1 tablet by mouth daily., Disp: , Rfl:    metoprolol  succinate (TOPROL -XL) 100 MG 24 hr tablet, Take 100 mg by mouth daily., Disp: , Rfl:    minoxidil  (LONITEN ) 10 MG tablet, TAKE 1 TABLET BY MOUTH DAILY FOR BLOOD PRESSURE, Disp: 90 tablet, Rfl: 4   montelukast  (SINGULAIR ) 10 MG tablet, Take 1 tablet (10 mg total) by mouth at bedtime., Disp: 90 tablet, Rfl: 4   mupirocin  ointment (BACTROBAN ) 2 %, Apply 1 Application topically 2 (two) times daily., Disp: 22 g, Rfl: 5   olmesartan  (BENICAR ) 40 MG tablet, Take 1 tablet (40 mg total) by mouth daily., Disp: 90 tablet, Rfl: 4   omeprazole (PRILOSEC) 20 MG capsule, Take 20 mg by mouth daily as needed (heartburn). , Disp: , Rfl:    Semaglutide ,0.25 or 0.5MG /DOS, (OZEMPIC , 0.25 OR 0.5 MG/DOSE,) 2 MG/1.5ML SOPN, Inject 0.5 mg into the skin once a week., Disp: 4.5 mL, Rfl: 4   sennosides-docusate sodium  (SENOKOT-S) 8.6-50 MG tablet, Take 2 tablets by mouth daily., Disp: , Rfl:    spironolactone  (ALDACTONE ) 25 MG tablet, TAKE 2 TABLETS(50 MG) BY MOUTH DAILY, Disp: 180 tablet, Rfl: 1   tiZANidine  (ZANAFLEX ) 2 MG tablet, Take 1 tablet (2 mg total) by mouth every 6 (six) hours as needed for muscle  spasms., Disp: 30 tablet, Rfl: 0 [3]  Allergies Allergen Reactions   Bupropion Other (See Comments)   Gabapentin Palpitations   Pioglitazone Palpitations   Vicodin [Hydrocodone-Acetaminophen ] Other (See Comments)    Reaction: racing heart   Vicoprofen [Hydrocodone-Ibuprofen] Other (See Comments)  Reaction: racing heart     Van Knee, MD 06/15/24 [phone]

## 2024-06-12 NOTE — Telephone Encounter (Signed)
Patient currently being seen at [facility].

## 2024-06-12 NOTE — Discharge Instructions (Addendum)
 You have a moderate amount of constipation which could be causing your symptoms.  There is no evidence of obstruction or perforation, which is good.  Push fluids, and continue MiraLAX .  You may increase this to twice a day.  Please follow-up with your primary care provider ASAP, you may need a right upper quadrant ultrasound if you do not get better.  I have sent her a message and she is aware that you are here today.  She wants you to reach out tomorrow. Take Tylenol  1000 mg 3-4 times a day and OTC lidocaine  patches to the area in case this is musculoskeletal.  Go immediately to the ER if the pain becomes severe, you start having fevers above 100.4, nausea, vomiting, abdominal distention, or for any other concerns.

## 2024-06-12 NOTE — Telephone Encounter (Signed)
 FYI Only or Action Required?: FYI only for provider: to UC or ED.  Patient was last seen in primary care on 06/04/2024 by Cannady, Jolene T, NP.  Called Nurse Triage reporting Abdominal Cramping.  Symptoms began today.  Interventions attempted: Nothing.  Symptoms are: gradually worsening.  Triage Disposition: See HCP Within 4 Hours (Or PCP Triage)  Patient/caregiver understands and will follow disposition?: Yes  Copied from CRM #[id number]. Topic: Clinical - Red Word Triage >> Jun 12, 2024 12:58 PM Amber H wrote: [facility] that prompted transfer to Nurse Triage: Patient c/o a very painful spot under right breast. Stated it looks like something is moving. Symps grew worse this weekend. Reason for Disposition  [1] MILD-MODERATE pain AND [2] constant AND [3] present > 2 hours  Answer Assessment - Initial Assessment Questions 1. LOCATION: Where does it hurt?      Upper right abdomen, under breast 2. RADIATION: Does the pain shoot anywhere else? (e.g., chest, back)     denies 3. ONSET: When did the pain begin? (e.g., minutes, hours or days ago)      About a month ago, but worsened over weekend 4. SUDDEN: Gradual or sudden onset?     sudden 5. PATTERN Does the pain come and go, or is it constant?     No specific pattern 6. SEVERITY: How bad is the pain?  (e.g., Scale 1-10; mild, moderate, or severe)     Severe, worsened by movement     9. CARDIAC SYMPTOMS: Do you have any of the following symptoms: chest pain, difficulty breathing, sweating, nausea?     denies 10. OTHER SYMPTOMS: Do you have any other symptoms? (e.g., back pain, diarrhea, fever, urination pain, vomiting)       When cramping occurs, patient can feel a know the size of an apple appears to upper abdomen under breast  Protocols used: Abdominal Pain - Upper-A-AH

## 2024-06-12 NOTE — ED Triage Notes (Signed)
 Pt c/o pain under R breast x1 mon. States feels like a knot that comes & goes. Its hard at times. Denies any trauma. Last mammo 10/25 which was WNL.

## 2024-06-14 ENCOUNTER — Other Ambulatory Visit

## 2024-06-15 NOTE — Telephone Encounter (Signed)
 Received provider portion Mgm [place] on pt portion.

## 2024-06-17 ENCOUNTER — Other Ambulatory Visit: Payer: Self-pay | Admitting: Nurse Practitioner

## 2024-06-18 NOTE — Telephone Encounter (Signed)
 Too soon for refill.  Requested Prescriptions  Pending Prescriptions Disp Refills   montelukast  (SINGULAIR ) 10 MG tablet [Pharmacy Med Name: MONTELUKAST  10MG  TABLETS] 90 tablet 4    Sig: TAKE 1 TABLET(10 MG) BY MOUTH AT BEDTIME     Pulmonology:  Leukotriene Inhibitors Passed - 06/18/2024  3:36 PM      Passed - Valid encounter within last 12 months    Recent Outpatient Visits           2 weeks ago Paroxysmal atrial fibrillation (HCC)   [place] [facility] [place], [place] T, NP   1 month ago Type 2 diabetes mellitus with morbid obesity (HCC)   [place] [facility] [place], [place] T, NP   5 months ago Type 2 diabetes mellitus with morbid obesity (HCC)   [place] [facility] [place], [place] T, NP   8 months ago Type 2 diabetes mellitus with morbid obesity (HCC)   [place] [facility] [place], [place] T, NP   11 months ago Type 2 diabetes mellitus with morbid obesity (HCC)   [place] [facility] [place], Melanie DASEN, NP       Future Appointments             In 1 week Argentina Clap, MD [facility] at [facility]

## 2024-06-21 NOTE — Telephone Encounter (Signed)
 Spoke with pt today explain she had drop off pap Lilly Cares (Trulicity) at provider office on [address] not received pap will follow up with [facility].

## 2024-06-22 ENCOUNTER — Other Ambulatory Visit: Payer: Self-pay | Admitting: Nurse Practitioner

## 2024-06-22 NOTE — Telephone Encounter (Signed)
 Requested Prescriptions  Refused Prescriptions Disp Refills   atorvastatin  (LIPITOR) 80 MG tablet [Pharmacy Med Name: ATORVASTATIN  80MG  TABLETS] 90 tablet 4    Sig: TAKE 1 TABLET(80 MG) BY MOUTH DAILY     Cardiovascular:  Antilipid - Statins Failed - 06/22/2024 11:54 AM      Failed - Lipid Panel in normal range within the last 12 months    Cholesterol, Total  Date Value Ref Range Status  04/23/2024 127 100 - 199 mg/dL Final   LDL Chol Calc (NIH)  Date Value Ref Range Status  04/23/2024 67 0 - 99 mg/dL Final   HDL  Date Value Ref Range Status  04/23/2024 47 >39 mg/dL Final   Triglycerides  Date Value Ref Range Status  04/23/2024 64 0 - 149 mg/dL Final         Passed - Patient is not pregnant      Passed - Valid encounter within last 12 months    Recent Outpatient Visits           2 weeks ago Paroxysmal atrial fibrillation (HCC)   [place] [facility] [place], [place] T, NP   2 months ago Type 2 diabetes mellitus with morbid obesity (HCC)   [place] [facility] [place], [place] T, NP   5 months ago Type 2 diabetes mellitus with morbid obesity (HCC)   [place] [facility] [place], [place] T, NP   8 months ago Type 2 diabetes mellitus with morbid obesity (HCC)   [place] [facility] [place], [place] T, NP   11 months ago Type 2 diabetes mellitus with morbid obesity (HCC)   [place] [facility] [place], Melanie DASEN, NP       Future Appointments             In 5 days Argentina Clap, MD [facility] at [facility]

## 2024-06-26 NOTE — Progress Notes (Unsigned)
" °  Cardiology Office Note   Date:  06/26/2024  ID:  Cindy [name], DOB [birth year], MRN [id number] PCP: Valerio Melanie DASEN, NP  [facility] Providers Cardiologist:  None { Click to update primary MD,subspecialty MD or APP then REFRESH:1}    History of Present Illness Cindy [name] is a 74 y.o. female PMH PAF, HTN, HLD, CAD, CKD 3B, HFpEF who presents for further evaluation management of paroxysmal atrial fibrillation.  Patient previously followed at [facility] but had to change providers given insurance issues.  Last LDL 67 03/2024.  They had been following a pericardial effusion which decreased in size per their report.  Relevant CVD History - Normal SPECT 01/2024 - TTE 07/2023 normal biventricular function, mild AI, small pericardial effusion - TTE 04/2023 normal biventricular function, mild AI, moderate circumferential pericardial effusion - Zio monitor 04/2023 mean heart rate 64 bpm, rare ectopy, 1 episode NSVT (12.8 seconds, max heart rate 148 bpm), 42 episodes of SVT (he longest 11 beats, max heart rate 140 bpm). - TTE 03/2023 normal biventricular function, mild AI, moderate effusion - TTE 10/2020 normal biventricular function, mild AI -Cath 09/2014 30% mid LAD disease and prior LAD stent (unknown date/type)   ROS: Pt denies any chest discomfort, jaw pain, arm pain, palpitations, syncope, presyncope, orthopnea, PND, or LE edema.  Studies Reviewed I have independently reviewed the patient's ECG, previous cardiac testing, previous medical records, previous blood work.  Physical Exam VS:  There were no vitals taken for this visit.       Wt Readings from Last 3 Encounters:  06/12/24 202 lb 13.2 oz (92 kg)  06/04/24 200 lb 12.8 oz (91.1 kg)  04/23/24 205 lb 3.2 oz (93.1 kg)    GEN: No acute distress. NECK: No JVD; No carotid bruits. CARDIAC: ***RRR, no murmurs, rubs, gallops. RESPIRATORY:  Clear to auscultation. EXTREMITIES:  Warm and well-perfused. No edema.  ASSESSMENT AND  PLAN Paroxysmal atrial fibrillation CAD status post prior LAD PCI HFpEF CKD 3B Pericardial effusion NSVT PSVT        {Are you ordering a CV Procedure (e.g. stress test, cath, DCCV, TEE, etc)?   Press F2        :[id number]}  Dispo: ***  Signed, Caron Poser, MD  "

## 2024-06-27 ENCOUNTER — Ambulatory Visit

## 2024-06-28 NOTE — Telephone Encounter (Signed)
 Received pt's  PAP Lilly Cares (Trulicity) ,faxed to [employer] along provider portion ,proof of income and Ins card.

## 2024-07-02 NOTE — Telephone Encounter (Signed)
 Received approval letter from [facility] Trulicity  thru 05/30/2025, approval letter index.

## 2024-07-18 ENCOUNTER — Ambulatory Visit

## 2024-10-23 ENCOUNTER — Ambulatory Visit: Admitting: Nurse Practitioner

## 2025-02-12 ENCOUNTER — Ambulatory Visit
